# Patient Record
Sex: Male | Born: 1955 | Race: Black or African American | Hispanic: No | Marital: Married | Smoking: Never smoker
Health system: Southern US, Community
[De-identification: ages and names within clinical notes are randomized; demographics above are authoritative.]

## PROBLEM LIST (undated history)

## (undated) ENCOUNTER — Emergency Department (HOSPITAL_COMMUNITY): Discharge status: Against Medical Advice | Payer: BC Managed Care – PPO

## (undated) DIAGNOSIS — R7303 Prediabetes: Secondary | ICD-10-CM

## (undated) DIAGNOSIS — E559 Vitamin D deficiency, unspecified: Secondary | ICD-10-CM

## (undated) DIAGNOSIS — G4730 Sleep apnea, unspecified: Secondary | ICD-10-CM

## (undated) DIAGNOSIS — F419 Anxiety disorder, unspecified: Secondary | ICD-10-CM

## (undated) DIAGNOSIS — R972 Elevated prostate specific antigen [PSA]: Secondary | ICD-10-CM

## (undated) DIAGNOSIS — M1990 Unspecified osteoarthritis, unspecified site: Secondary | ICD-10-CM

## (undated) DIAGNOSIS — M2550 Pain in unspecified joint: Secondary | ICD-10-CM

## (undated) DIAGNOSIS — I10 Essential (primary) hypertension: Secondary | ICD-10-CM

## (undated) DIAGNOSIS — N529 Male erectile dysfunction, unspecified: Secondary | ICD-10-CM

## (undated) DIAGNOSIS — H9190 Unspecified hearing loss, unspecified ear: Secondary | ICD-10-CM

## (undated) DIAGNOSIS — R079 Chest pain, unspecified: Secondary | ICD-10-CM

## (undated) DIAGNOSIS — Z96649 Presence of unspecified artificial hip joint: Secondary | ICD-10-CM

## (undated) DIAGNOSIS — E739 Lactose intolerance, unspecified: Secondary | ICD-10-CM

## (undated) DIAGNOSIS — R200 Anesthesia of skin: Secondary | ICD-10-CM

## (undated) DIAGNOSIS — K219 Gastro-esophageal reflux disease without esophagitis: Secondary | ICD-10-CM

## (undated) DIAGNOSIS — J45909 Unspecified asthma, uncomplicated: Secondary | ICD-10-CM

## (undated) DIAGNOSIS — N400 Enlarged prostate without lower urinary tract symptoms: Secondary | ICD-10-CM

## (undated) DIAGNOSIS — Z96659 Presence of unspecified artificial knee joint: Secondary | ICD-10-CM

## (undated) DIAGNOSIS — C61 Malignant neoplasm of prostate: Secondary | ICD-10-CM

## (undated) HISTORY — PX: FOOT SURGERY: SHX648

## (undated) HISTORY — DX: Prediabetes: R73.03

## (undated) HISTORY — DX: Benign prostatic hyperplasia without lower urinary tract symptoms: N40.0

## (undated) HISTORY — DX: Presence of unspecified artificial hip joint: Z96.649

## (undated) HISTORY — DX: Vitamin D deficiency, unspecified: E55.9

## (undated) HISTORY — PX: TOTAL HIP ARTHROPLASTY: SHX124

## (undated) HISTORY — DX: Male erectile dysfunction, unspecified: N52.9

## (undated) HISTORY — DX: Unspecified asthma, uncomplicated: J45.909

## (undated) HISTORY — DX: Lactose intolerance, unspecified: E73.9

## (undated) HISTORY — DX: Anesthesia of skin: R20.0

## (undated) HISTORY — DX: Unspecified hearing loss, unspecified ear: H91.90

## (undated) HISTORY — DX: Essential (primary) hypertension: I10

## (undated) HISTORY — DX: Anxiety disorder, unspecified: F41.9

## (undated) HISTORY — DX: Malignant neoplasm of prostate: C61

## (undated) HISTORY — DX: Presence of unspecified artificial knee joint: Z96.659

---
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
## 1999-05-07 ENCOUNTER — Emergency Department (HOSPITAL_COMMUNITY): Admission: EM | Admit: 1999-05-07 | Discharge: 1999-05-07 | Payer: Self-pay | Admitting: Emergency Medicine

## 2002-07-27 ENCOUNTER — Ambulatory Visit (HOSPITAL_BASED_OUTPATIENT_CLINIC_OR_DEPARTMENT_OTHER): Admission: RE | Admit: 2002-07-27 | Discharge: 2002-07-27 | Payer: Self-pay | Admitting: Urology

## 2003-01-03 ENCOUNTER — Encounter: Payer: Self-pay | Admitting: Family Medicine

## 2003-01-03 ENCOUNTER — Encounter: Admission: RE | Admit: 2003-01-03 | Discharge: 2003-01-03 | Payer: Self-pay | Admitting: Family Medicine

## 2004-03-27 ENCOUNTER — Ambulatory Visit: Payer: Self-pay | Admitting: Family Medicine

## 2004-04-08 ENCOUNTER — Ambulatory Visit: Payer: Self-pay | Admitting: Family Medicine

## 2004-04-14 ENCOUNTER — Ambulatory Visit: Payer: Self-pay | Admitting: Family Medicine

## 2005-01-25 ENCOUNTER — Ambulatory Visit: Payer: Self-pay | Admitting: Family Medicine

## 2005-05-10 ENCOUNTER — Ambulatory Visit: Payer: Self-pay | Admitting: Family Medicine

## 2005-05-14 ENCOUNTER — Ambulatory Visit: Payer: Self-pay | Admitting: Family Medicine

## 2005-05-17 ENCOUNTER — Encounter: Admission: RE | Admit: 2005-05-17 | Discharge: 2005-08-15 | Payer: Self-pay | Admitting: Family Medicine

## 2005-09-17 ENCOUNTER — Ambulatory Visit: Payer: Self-pay | Admitting: Family Medicine

## 2005-10-08 ENCOUNTER — Ambulatory Visit: Payer: Self-pay | Admitting: Internal Medicine

## 2006-01-25 ENCOUNTER — Ambulatory Visit: Payer: Self-pay | Admitting: Family Medicine

## 2006-07-12 ENCOUNTER — Inpatient Hospital Stay (HOSPITAL_COMMUNITY): Admission: RE | Admit: 2006-07-12 | Discharge: 2006-07-16 | Payer: Self-pay | Admitting: Orthopaedic Surgery

## 2006-08-29 ENCOUNTER — Ambulatory Visit: Payer: Self-pay | Admitting: Gastroenterology

## 2006-09-09 ENCOUNTER — Ambulatory Visit: Payer: Self-pay | Admitting: Gastroenterology

## 2006-12-09 ENCOUNTER — Ambulatory Visit: Payer: Self-pay | Admitting: Family Medicine

## 2006-12-09 DIAGNOSIS — I10 Essential (primary) hypertension: Secondary | ICD-10-CM | POA: Insufficient documentation

## 2007-04-01 ENCOUNTER — Ambulatory Visit: Payer: Self-pay | Admitting: Internal Medicine

## 2007-04-18 ENCOUNTER — Inpatient Hospital Stay (HOSPITAL_COMMUNITY): Admission: RE | Admit: 2007-04-18 | Discharge: 2007-04-22 | Payer: Self-pay | Admitting: Orthopaedic Surgery

## 2007-04-24 ENCOUNTER — Telehealth: Payer: Self-pay | Admitting: Family Medicine

## 2007-05-01 ENCOUNTER — Telehealth: Payer: Self-pay | Admitting: Internal Medicine

## 2007-06-15 ENCOUNTER — Ambulatory Visit: Payer: Self-pay | Admitting: Family Medicine

## 2007-06-15 DIAGNOSIS — J069 Acute upper respiratory infection, unspecified: Secondary | ICD-10-CM | POA: Insufficient documentation

## 2007-06-15 LAB — CONVERTED CEMR LAB
BUN: 8 mg/dL (ref 6–23)
CO2: 35 meq/L — ABNORMAL HIGH (ref 19–32)
Calcium: 9.3 mg/dL (ref 8.4–10.5)
Chloride: 91 meq/L — ABNORMAL LOW (ref 96–112)
Creatinine, Ser: 1 mg/dL (ref 0.4–1.5)
GFR calc Af Amer: 102 mL/min
GFR calc non Af Amer: 84 mL/min
Glucose, Bld: 93 mg/dL (ref 70–99)
Potassium: 3.2 meq/L — ABNORMAL LOW (ref 3.5–5.1)
Sodium: 136 meq/L (ref 135–145)

## 2007-07-12 HISTORY — PX: NM MYOVIEW LTD: HXRAD82

## 2007-07-24 ENCOUNTER — Ambulatory Visit: Payer: Self-pay | Admitting: Family Medicine

## 2007-07-24 DIAGNOSIS — F411 Generalized anxiety disorder: Secondary | ICD-10-CM | POA: Insufficient documentation

## 2007-07-24 DIAGNOSIS — H8110 Benign paroxysmal vertigo, unspecified ear: Secondary | ICD-10-CM | POA: Insufficient documentation

## 2007-07-25 ENCOUNTER — Ambulatory Visit: Payer: Self-pay | Admitting: Family Medicine

## 2007-07-31 ENCOUNTER — Ambulatory Visit: Payer: Self-pay

## 2007-08-04 ENCOUNTER — Ambulatory Visit: Payer: Self-pay | Admitting: Family Medicine

## 2007-09-01 ENCOUNTER — Ambulatory Visit: Payer: Self-pay | Admitting: Family Medicine

## 2008-01-19 ENCOUNTER — Ambulatory Visit: Payer: Self-pay | Admitting: Family Medicine

## 2008-01-19 DIAGNOSIS — F528 Other sexual dysfunction not due to a substance or known physiological condition: Secondary | ICD-10-CM | POA: Insufficient documentation

## 2008-11-11 ENCOUNTER — Ambulatory Visit: Payer: Self-pay | Admitting: Family Medicine

## 2008-11-11 LAB — CONVERTED CEMR LAB
Alkaline Phosphatase: 69 units/L (ref 39–117)
BUN: 12 mg/dL (ref 6–23)
Basophils Absolute: 0 10*3/uL (ref 0.0–0.1)
Basophils Relative: 0.5 % (ref 0.0–3.0)
Bilirubin, Direct: 0.1 mg/dL (ref 0.0–0.3)
Chloride: 99 meq/L (ref 96–112)
Cholesterol: 131 mg/dL (ref 0–200)
Eosinophils Absolute: 0.1 10*3/uL (ref 0.0–0.7)
Glucose, Bld: 101 mg/dL — ABNORMAL HIGH (ref 70–99)
HDL: 34.2 mg/dL — ABNORMAL LOW (ref >39.00)
LDL Cholesterol: 73 mg/dL (ref 0–99)
Leukocytes, UA: NEGATIVE
Lymphocytes Relative: 23.6 % (ref 12.0–46.0)
MCHC: 33.3 g/dL (ref 30.0–36.0)
Monocytes Absolute: 0.6 10*3/uL (ref 0.1–1.0)
Neutrophils Relative %: 63.2 % (ref 43.0–77.0)
Nitrite: NEGATIVE
PSA: 2.73 ng/mL (ref 0.10–4.00)
Platelets: 339 10*3/uL (ref 150.0–400.0)
Potassium: 3.2 meq/L — ABNORMAL LOW (ref 3.5–5.1)
RBC: 4.72 M/uL (ref 4.22–5.81)
Sodium: 141 meq/L (ref 135–145)
Specific Gravity, Urine: 1.02 (ref 1.000–1.030)
Total Bilirubin: 0.5 mg/dL (ref 0.3–1.2)
Total Protein, Urine: NEGATIVE mg/dL
VLDL: 24 mg/dL (ref 0.0–40.0)
pH: 5.5 (ref 5.0–8.0)

## 2008-11-15 ENCOUNTER — Ambulatory Visit: Payer: Self-pay | Admitting: Family Medicine

## 2008-12-04 ENCOUNTER — Ambulatory Visit: Payer: Self-pay | Admitting: Family Medicine

## 2009-04-17 ENCOUNTER — Ambulatory Visit: Payer: Self-pay | Admitting: Family Medicine

## 2009-04-17 DIAGNOSIS — J309 Allergic rhinitis, unspecified: Secondary | ICD-10-CM | POA: Insufficient documentation

## 2009-05-21 ENCOUNTER — Ambulatory Visit: Payer: Self-pay | Admitting: Family Medicine

## 2009-07-03 ENCOUNTER — Ambulatory Visit: Payer: Self-pay | Admitting: Internal Medicine

## 2009-12-19 ENCOUNTER — Ambulatory Visit: Payer: Self-pay | Admitting: Family Medicine

## 2010-01-06 ENCOUNTER — Ambulatory Visit: Payer: Self-pay | Admitting: Family Medicine

## 2010-02-18 ENCOUNTER — Ambulatory Visit: Payer: Self-pay | Admitting: Family Medicine

## 2010-03-02 ENCOUNTER — Ambulatory Visit: Payer: Self-pay | Admitting: Family Medicine

## 2010-03-13 ENCOUNTER — Telehealth: Payer: Self-pay | Admitting: Family Medicine

## 2010-03-19 ENCOUNTER — Telehealth: Payer: Self-pay | Admitting: Family Medicine

## 2010-04-17 ENCOUNTER — Ambulatory Visit
Admission: RE | Admit: 2010-04-17 | Discharge: 2010-04-17 | Payer: Self-pay | Source: Home / Self Care | Attending: Family Medicine | Admitting: Family Medicine

## 2010-04-17 ENCOUNTER — Encounter: Payer: Self-pay | Admitting: Family Medicine

## 2010-04-20 ENCOUNTER — Telehealth: Payer: Self-pay | Admitting: Gastroenterology

## 2010-04-20 LAB — CONVERTED CEMR LAB
ALT: 15 units/L (ref 0–53)
BUN: 13 mg/dL (ref 6–23)
Basophils Absolute: 0 10*3/uL (ref 0.0–0.1)
Basophils Relative: 0 % (ref 0–1)
CO2: 31 meq/L (ref 19–32)
Calcium: 9.2 mg/dL (ref 8.4–10.5)
Chloride: 97 meq/L (ref 96–112)
Creatinine, Ser: 0.89 mg/dL (ref 0.40–1.50)
Eosinophils Relative: 2 % (ref 0–5)
HCT: 39.8 % (ref 39.0–52.0)
Hemoglobin: 13.1 g/dL (ref 13.0–17.0)
Lymphocytes Relative: 21 % (ref 12–46)
MCHC: 32.9 g/dL (ref 30.0–36.0)
Monocytes Absolute: 1.1 10*3/uL — ABNORMAL HIGH (ref 0.1–1.0)
Monocytes Relative: 12 % (ref 3–12)
Neutro Abs: 5.9 10*3/uL (ref 1.7–7.7)
RBC: 5.15 M/uL (ref 4.22–5.81)
RDW: 17.1 % — ABNORMAL HIGH (ref 11.5–15.5)
TSH: 1.714 microintl units/mL (ref 0.350–4.500)
Total Protein: 8 g/dL (ref 6.0–8.3)

## 2010-04-21 ENCOUNTER — Encounter: Payer: Self-pay | Admitting: Gastroenterology

## 2010-04-21 ENCOUNTER — Ambulatory Visit
Admission: RE | Admit: 2010-04-21 | Discharge: 2010-04-21 | Payer: Self-pay | Source: Home / Self Care | Attending: Gastroenterology | Admitting: Gastroenterology

## 2010-05-03 ENCOUNTER — Encounter: Payer: Self-pay | Admitting: Orthopaedic Surgery

## 2010-05-10 LAB — CONVERTED CEMR LAB
Albumin ELP: 44.3 % — ABNORMAL LOW (ref 55.8–66.1)
Alpha-1-Globulin: 4.2 % (ref 2.9–4.9)
Gamma Globulin: 27 % — ABNORMAL HIGH (ref 11.1–18.8)
Total Protein: 8.4 g/dL — ABNORMAL HIGH (ref 6.0–8.3)

## 2010-05-14 NOTE — Assessment & Plan Note (Signed)
Summary: BLOOD IN STOOL//SLM   Vital Signs:  Patient profile:   55 year old male Weight:      269 pounds Temp:     98.0 degrees F oral BP sitting:   120 / 84  (left arm) Cuff size:   regular  Vitals Entered By: Kern Reap CMA Duncan Dull) (April 17, 2010 4:17 PM) CC: right side abdominal pain, BRB with bowel movement   CC:  right side abdominal pain and BRB with bowel movement.  History of Present Illness: Jonathan [name] is a 55 year old male, who comes in today for evaluation of an 8 month history of intermittent right-sided lower quadrant abdominal pain, change in bowel habits with blood in his bowel movements.  He states about a month ago he developed constipation and did not have a bowel movement for a week.  He then took the colon prep for the colonoscopy however, this still did not get them completely cleaned out.  Since that time.  He said, trouble with hard stools, and now some bright red rectal bleeding.  He had a colonoscopy in 2002 and a follow-up.  Another 07, which were normal.  Family history, pertinent his mother died of colon cancer  Allergies: 1)  ! Codeine  Past History:  Past medical, surgical, family and social histories (including risk factors) reviewed for relevance to current acute and chronic problems.  Past Medical History: Reviewed history from 09/01/2007 and no changes required. Hypertension ED Anxiety total hip replacement R&L  Past Surgical History: Reviewed history from 12/09/2006 and no changes required. Dislocated R Hip  Family History: Reviewed history from 12/09/2006 and no changes required. Family History Hypertension Family History Other cancer-colon Family History of Asthma Family History of Prostate CA 1st degree relative <50  Social History: Reviewed history from 12/09/2006 and no changes required. Never Smoked Alcohol use-no Drug use-no Married  Review of Systems      See HPI  Physical Exam  General:   Well-developed,well-nourished,in no acute distress; alert,appropriate and cooperative throughout examination Abdomen:  Bowel sounds positive,abdomen soft and non-tender without masses, organomegaly or hernias noted. Rectal:  deferred to GI   Problems:  Medical Problems Added: 1)  Dx of Rectal Bleeding  (ICD-569.3)  Impression & Recommendations:  Problem # 1:  RECTAL BLEEDING (ICD-569.3) Assessment New  Orders: Venipuncture (16109) TLB-BMP (Basic Metabolic Panel-BMET) (80048-METABOL) TLB-CBC Platelet - w/Differential (85025-CBCD) TLB-Hepatic/Liver Function Pnl (80076-HEPATIC) TLB-TSH (Thyroid Stimulating Hormone) (84443-TSH) Specimen Handling (60454)  Complete Medication List: 1)  Triamcinolone Acetonide 0.5 % Oint (Triamcinolone acetonide) .... Apply at bedtime 2)  Proventil Hfa 108 (90 Base) Mcg/act Aers (Albuterol sulfate) .... 2 puffs three times a day as needed 3)  Atenolol-chlorthalidone 50-25 Mg Tabs (Atenolol-chlorthalidone) .... Take one tab daily  Other Orders: Gastroenterology Referral (GI)  Patient Instructions: 1)  drink 30 ounces of water daily. 2)  Take Metamucil two tablespoons at bedtime and take one MiraLax tablet every morning. 3)  I will put in a note to GI to have them see u  next week for further evaluation   Orders Added: 1)  Gastroenterology Referral [GI] 2)  Venipuncture [09811] 3)  TLB-BMP (Basic Metabolic Panel-BMET) [80048-METABOL] 4)  TLB-CBC Platelet - w/Differential [85025-CBCD] 5)  TLB-Hepatic/Liver Function Pnl [80076-HEPATIC] 6)  TLB-TSH (Thyroid Stimulating Hormone) [84443-TSH] 7)  Est. Patient Level III [91478] 8)  Specimen Handling [99000]

## 2010-05-14 NOTE — Assessment & Plan Note (Signed)
Summary: right leg issues//ccm   Vital Signs:  Patient profile:   55 year old male Weight:      243 pounds Temp:     97.3 degrees F oral BP sitting:   130 / 78  (right arm) Cuff size:   regular  Vitals Entered By: Duard Brady LPN (July 03, 2009 12:47 PM) CC: c/o (R) lower leg pain x2days - no fall or injury noted. Is Patient Diabetic? No   CC:  c/o (R) lower leg pain x2days - no fall or injury noted.[name]  History of Present Illness: 55 year old patient who has a history of treated hypertension.  He presents with a 3-day history of pain involving his right posterior calf.  For the past month, he has had patchy areas of eczema involving his distal lower extremities.  He is now on Cozaar for blood pressure control.  He has had some ED issues and hypokalemia and  combination beta-blocker diuretic therapy has been discontinued.  Preventive Screening-Counseling & Management  Alcohol-Tobacco     Smoking Status: never  Allergies: 1)  ! Codeine  Past History:  Past Medical History: Reviewed history from 09/01/2007 and no changes required. Hypertension ED Anxiety total hip replacement R&L  Review of Systems       The patient complains of suspicious skin lesions.  The patient denies anorexia, fever, weight loss, weight gain, vision loss, decreased hearing, hoarseness, chest pain, syncope, dyspnea on exertion, peripheral edema, prolonged cough, headaches, hemoptysis, abdominal pain, melena, hematochezia, severe indigestion/heartburn, hematuria, incontinence, genital sores, muscle weakness, transient blindness, difficulty walking, depression, unusual weight change, abnormal bleeding, enlarged lymph nodes, angioedema, breast masses, and testicular masses.    Physical Exam  General:  overweight-appearing.  140/80 Abdomen:  no femoral bruits Pulses:  dorsalis pedis pulses faint posterior tibia pulses full Extremities:  a small, slightly tender varix was present involving his right  postero-lateral calf Skin:  dry scaly hyperpigmented patches of dermatitis noted over his distal lower legs   Impression & Recommendations:  Problem # 1:  HYPERTENSION (ICD-401.9)  His updated medication list for this problem includes:    Cozaar 50 Mg Tabs (Losartan potassium) .[name]... Take 1 tablet by mouth every morning  Problem # 2:  ECZEMA (ICD-692.9)  His updated medication list for this problem includes:    Prednisone 20 Mg Tabs (Prednisone) ..... Uad    Triamcinolone Acetonide 0.5 % Crea (Triamcinolone acetonide) .[name]... Apply to the rash twice daily  Problem # 3:  VARICES OF OTHER SITES (ICD-456.8) was treated with elevation anti-inflammatory medications and observe.  If remains symptomatically consider support stockings and possibly referral to a vascular surgeon  Complete Medication List: 1)  Cialis 20 Mg Tabs (Tadalafil) .... Uad 2)  Cozaar 50 Mg Tabs (Losartan potassium) .... Take 1 tablet by mouth every morning 3)  Prostate 5lx  .... Two times a day prn 4)  Prednisone 20 Mg Tabs (Prednisone) .... Uad 5)  Triamcinolone Acetonide 0.5 % Crea (Triamcinolone acetonide) .... Apply to the rash twice daily  Patient Instructions: 1)  Take 400-600mg  of Ibuprofen (Advil, Motrin) with food every 4-6 hours as needed for relief of pain or comfort of fever. 2)  keep  right leg elevated as much as possible 3)  Please schedule a follow-up appointment as needed. Prescriptions: TRIAMCINOLONE ACETONIDE 0.5 % CREA (TRIAMCINOLONE ACETONIDE) apply to the rash twice daily  #60 gms x 2   Entered and Authorized by:   Gordy Savers  MD   Signed by:  Gordy Savers  MD on 07/03/2009   Method used:   [facility] [address] [phone]* (retail)       [address]       [place], [place]  [address]       Ph: [phone] or [phone]       Fax: [phone]   RxID:   [id number] PREDNISONE 20 MG TABS (PREDNISONE) UAD  #20 x 1    Entered and Authorized by:   Gordy Savers  MD   Signed by:   Gordy Savers  MD on 07/03/2009   Method used:   [facility]        [facility]  [address] [phone]* (retail)       [address]       [place], [place]  [address]       Ph: [phone] or [phone]       Fax: [phone]   RxID:   [id number] COZAAR 50 MG TABS (LOSARTAN POTASSIUM) Take 1 tablet by mouth every morning  #100 x 3   Entered and Authorized by:   Gordy Savers  MD   Signed by:   Gordy Savers  MD on 07/03/2009   Method used:   [facility]        [facility]  [address] [phone]* (retail)       [address]       [place], [place]  [address]       Ph: [phone] or [phone]       Fax: [phone]   RxID:   [id number] CIALIS 20 MG TABS (TADALAFIL) UAD  #6 x 11   Entered and Authorized by:   Gordy Savers  MD   Signed by:   Gordy Savers  MD on 07/03/2009   Method used:   [facility]        [facility]  [address] [phone]* (retail)       [address]       [place], [place]  [address]       Ph: [phone] or [phone]       Fax: [phone]   RxID:   [id number]

## 2010-05-14 NOTE — Assessment & Plan Note (Signed)
Summary: 3 week follow up/cjr/pt rescd//ccm   Vital Signs:  Patient profile:   55 year old male Weight:      259 pounds Temp:     97.9 degrees F oral BP sitting:   120 / 80  (left arm) Cuff size:   regular  Vitals Entered By: Kern Reap CMA Duncan Dull) (February 18, 2010 9:02 AM) CC: follow-up visit   CC:  follow-up visit.  History of Present Illness: Jonathan [name] is a 55 year old male, who comes in today for follow-up of hypertension, and a skin rash.  We had him on max dose of Cozaar 100 mg daily however, his blood pressure did not drop back to normal.  We then restarted the Tenoretic 50 -- 25 daily.  BP normal 120/80, but it has a very, very, very negative effect on his nature.  We discussed options.  Will stop the Tenoretic and switch to Norvasc  Allergies: 1)  ! Codeine  Past History:  Past medical, surgical, family and social histories (including risk factors) reviewed for relevance to current acute and chronic problems.  Past Medical History: Reviewed history from 09/01/2007 and no changes required. Hypertension ED Anxiety total hip replacement R&L  Past Surgical History: Reviewed history from 12/09/2006 and no changes required. Dislocated R Hip  Family History: Reviewed history from 12/09/2006 and no changes required. Family History Hypertension Family History Other cancer-colon Family History of Asthma Family History of Prostate CA 1st degree relative <50  Social History: Reviewed history from 12/09/2006 and no changes required. Never Smoked Alcohol use-no Drug use-no Married  Review of Systems      See HPI  Physical Exam  General:  Well-developed,well-nourished,in no acute distress; alert,appropriate and cooperative throughout examination   Impression & Recommendations:  Problem # 1:  HYPERTENSION (ICD-401.9) Assessment Improved  The following medications were removed from the medication list:    Cozaar 100 Mg Tabs (Losartan potassium) .[name]... Take  1 tablet by mouth every morning    Tenoretic 50 50-25 Mg Tabs (Atenolol-chlorthalidone) .[name]... Take 1 tablet by mouth every morning His updated medication list for this problem includes:    Norvasc 5 Mg Tabs (Amlodipine besylate) .[name]... Take 1 tablet by mouth every morning  Complete Medication List: 1)  Norvasc 5 Mg Tabs (Amlodipine besylate) .... Take 1 tablet by mouth every morning 2)  Triamcinolone Acetonide 0.5 % Oint (Triamcinolone acetonide) .... Apply at bedtime  Patient Instructions: 1)  stop the Tenoretic. 2)  Begin Norvasc 5 mg daily check blood pressure daily in the morning.  Return in two weeks for follow-up with the data  the device Prescriptions: TRIAMCINOLONE ACETONIDE 0.5 % OINT (TRIAMCINOLONE ACETONIDE) apply at bedtime  #60 gr x 3   Entered and Authorized by:   Roderick Pee MD   Signed by:   Roderick Pee MD on 02/18/2010   Method used:   [facility]        [facility] [address] [phone]* (retail)       [address]       [place], [place]  [address]       Ph: [phone] or [phone]       Fax: [phone]   RxID:   [id number] NORVASC 5 MG TABS (AMLODIPINE BESYLATE) Take 1 tablet by mouth every morning  #30 x 1   Entered and Authorized by:   Roderick Pee MD   Signed by:   Roderick Pee MD  on 02/18/2010   Method used:   [facility] to        [facility] [phone]* (retail)       [address]       [place], [place]  [address]       Ph: [phone] or [phone]       Fax: [phone]   RxID:   [id number]    Orders Added: 1)  Est. Patient Level III [25956]

## 2010-05-14 NOTE — Procedures (Signed)
Summary: LEC COLON   Colonoscopy  Procedure date:  09/09/2006  Findings:      Location:  [facility].   Patient Name: Jonathan [name], Jonathan [name] MRN:  Procedure Procedures: Colonoscopy CPT: 08657.  Personnel: Endoscopist: Barbette Hair. Arlyce Dice, MD.  Patient Consent: Procedure, Alternatives, Risks and Benefits discussed, consent obtained, from patient.  Indications  Increased Risk Screening: For family history of colorectal neoplasia, in  parent  History  Current Medications: Patient is not currently taking Coumadin.  Pre-Exam Physical: Performed Sep 09, 2006. Cardio-pulmonary exam, HEENT exam , Abdominal exam, Mental status exam WNL.  Comments: Patient history reviewed/updated, physical performed prior to initiation of sedation?yes Exam Exam: Extent of exam reached: Cecum, extent intended: Cecum.  The cecum was identified by appendiceal orifice and IC valve. Time to Cecum: 00:06: 57. Time for Withdrawl: 00:05:45. Colon retroflexion performed. ASA Classification: I. Tolerance: good.  Monitoring: Pulse and BP monitoring, Oximetry used. Supplemental O2 given. at 2 Liters.  Colon Prep Used Miralax for colon prep. Prep results: good.  Sedation Meds: Patient assessed and found to be appropriate for moderate (conscious) sedation. Sedation was managed by the Endoscopist. Fentanyl 75 mcg. given IV. Versed 8 mg. given IV.  Findings TUMOR: Benign tumor, suspected,  in Ascending Colon. 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  - NORMAL EXAM: Transverse Colon to Rectum.  NORMAL EXAM: Cecum.  NORMAL EXAM: Rectum.   Assessment Abnormal examination, see findings above.  Comments: Incidental Lipoma Events  Unplanned Interventions: No intervention was required.  Unplanned Events: There were no complications. Plans  Post Exam Instructions: Post sedation instructions given.  Patient Education: Patient given standard instructions for: a normal  exam.  Disposition: After procedure patient sent to recovery. After recovery patient sent home.  Scheduling/Referral: Colonoscopy, to Barbette Hair. Arlyce Dice, MD, around Sep 09, 2011.    cc: Tinnie Gens A. Tawanna Cooler, MD  This report was created from the original endoscopy report, which was reviewed and signed by the above listed endoscopist.

## 2010-05-14 NOTE — Letter (Signed)
Summary: [facility] Instructions  [facility]  [address] [place], [place] [address]   Phone: [phone]  Fax: [phone]       Jonathan [name]    02-20-1956    MRN: [id number]        Procedure Day /Date:06-08-2010     Arrival Time: 7:30 Am      Procedure Time: 8:00 AM     Location of Procedure:                    X     [facility] (4th Floor) PREPARATION FOR COLONOSCOPY WITH MOVIPREP   Starting 5 days prior to your procedure 06-03-2010 do not eat nuts, seeds, popcorn, corn, beans, peas,  salads, or any raw vegetables.  Do not take any fiber supplements (e.g. Metamucil, Citrucel, and Benefiber).  THE DAY BEFORE YOUR PROCEDURE         DATE: 06-07-2010 DAY: SundaY  1.  Drink clear liquids the entire day-NO SOLID FOOD  2.  Do not drink anything colored red or purple.  Avoid juices with pulp.  No orange juice.  3.  Drink at least 64 oz. (8 glasses) of fluid/clear liquids during the day to prevent dehydration and help the prep work efficiently.  CLEAR LIQUIDS INCLUDE: Water Jello Ice Popsicles Tea (sugar ok, no milk/cream) Powdered fruit flavored drinks Coffee (sugar ok, no milk/cream) Gatorade Juice: apple, white grape, white cranberry  Lemonade Clear bullion, consomm, broth Carbonated beverages (any kind) Strained chicken noodle soup Hard Candy                             4.  In the morning, mix first dose of MoviPrep solution:    Empty 1 Pouch A and 1 Pouch B into the disposable container    Add lukewarm drinking water to the top line of the container. Mix to dissolve    Refrigerate (mixed solution should be used within 24 hrs)  5.  Begin drinking the prep at 5:00 p.m. The MoviPrep container is divided by 4 marks.   Every 15 minutes drink the solution down to the next mark (approximately 8 oz) until the full liter is complete.   6.  Follow completed prep with 16 oz of clear liquid of your choice (Nothing red or purple).  Continue to  drink clear liquids until bedtime.  7.  Before going to bed, mix second dose of MoviPrep solution:    Empty 1 Pouch A and 1 Pouch B into the disposable container    Add lukewarm drinking water to the top line of the container. Mix to dissolve    Refrigerate  THE DAY OF YOUR PROCEDURE      DATE: 06-08-2010 DAY: Monday  Beginning at 3:00 AM  (5 hours before procedure):         1. Every 15 minutes, drink the solution down to the next mark (approx 8 oz) until the full liter is complete.  2. Follow completed prep with 16 oz. of clear liquid of your choice.    3. You may drink clear liquids until 6:00 AM  (2 HOURS BEFORE PROCEDURE).   MEDICATION INSTRUCTIONS  Unless otherwise instructed, you should take regular prescription medications with a small sip of water   as early as possible the morning of your procedure.        OTHER INSTRUCTIONS  You will need a responsible adult at least 54  years of age to accompany you and drive you home.   This person must remain in the waiting room during your procedure.  Wear loose fitting clothing that is easily removed.  Leave jewelry and other valuables at home.  However, you may wish to bring a book to read or  an iPod/MP3 player to listen to music as you wait for your procedure to start.  Remove all body piercing jewelry and leave at home.  Total time from sign-in until discharge is approximately 2-3 hours.  You should go home directly after your procedure and rest.  You can resume normal activities the  day after your procedure.  The day of your procedure you should not:   Drive   Make legal decisions   Operate machinery   Drink alcohol   Return to work  You will receive specific instructions about eating, activities and medications before you leave.    The above instructions have been reviewed and explained to me by   _______________________    I fully understand and can verbalize these instructions  _____________________________ Date _________

## 2010-05-14 NOTE — Progress Notes (Signed)
Summary: Pt req cough med. Tessalon doesnt work.  Phone Note Call from Patient Call back at [phone]   Caller: spouse - Synetta Fail Summary of Call: Pt still has dry hacky cough and tessalon pearls do not work. Pt can not take any med with codene in it , due to bp. Pt is req a different cough med. [facility] [address] [phone] Initial call taken by: Lucy Antigua,  March 19, 2010 9:56 AM  Follow-up for Phone Call        [facility] please call.......... okay to take Hydromet, with hypertension one half to 1 teaspoon up to 3 times a day as needed Follow-up by: Roderick Pee MD,  March 19, 2010 1:24 PM  Additional Follow-up for Phone Call Additional follow up Details #1::        patient has tried hydromet and it makes him feel "sick" Additional Follow-up by: Kern Reap CMA Duncan Dull),  March 19, 2010 1:57 PM    Additional Follow-up for Phone Call Additional follow up Details #2::    there are no other options if the Tessalon Perles don't, work that's all we have Follow-up by: Roderick Pee MD,  March 19, 2010 3:07 PM  Additional Follow-up for Phone Call Additional follow up Details #3:: Details for Additional Follow-up Action Taken: left message on machine  Additional Follow-up by: Kern Reap CMA Duncan Dull),  March 19, 2010 3:39 PM

## 2010-05-14 NOTE — Assessment & Plan Note (Signed)
Summary: HIBH BP/PS   Vital Signs:  Patient profile:   55 year old male Weight:      260 pounds Temp:     97.8 degrees F oral BP sitting:   140 / 100  (left arm) Cuff size:   large  Vitals Entered By: Kathrynn Speed CMA (December 19, 2009 1:17 PM) CC: HBH BP, src Is Patient Diabetic? No   CC:  HBH BP and src.  History of Present Illness: Jonathan [name] is a 55 year old male, who comes in today for evaluation of hypertension.  He spent on Cozaar 50 mg daily for hypertension with good control.  However, in the last couple weeks.  His blood pressure has gone up.  Today it's 160/94.  Right arm sitting position.  He states he takes his medication daily  Preventive Screening-Counseling & Management  Alcohol-Tobacco     Smoking Status: never  Current Medications (verified): 1)  Cozaar 50 Mg Tabs (Losartan Potassium) .... Take 1 Tablet By Mouth Every Morning  Allergies (verified): 1)  ! Codeine  Past History:  Past medical, surgical, family and social histories (including risk factors) reviewed for relevance to current acute and chronic problems.  Past Medical History: Reviewed history from 09/01/2007 and no changes required. Hypertension ED Anxiety total hip replacement R&L  Past Surgical History: Reviewed history from 12/09/2006 and no changes required. Dislocated R Hip  Family History: Reviewed history from 12/09/2006 and no changes required. Family History Hypertension Family History Other cancer-colon Family History of Asthma Family History of Prostate CA 1st degree relative <50  Social History: Reviewed history from 12/09/2006 and no changes required. Never Smoked Alcohol use-no Drug use-no Married  Review of Systems      See HPI  Physical Exam  General:  Well-developed,well-nourished,in no acute distress; alert,appropriate and cooperative throughout examination Heart:  160/94 right arm sitting position   Impression & Recommendations:  Problem # 1:   HYPERTENSION (ICD-401.9) Assessment Deteriorated  His updated medication list for this problem includes:    Cozaar 50 Mg Tabs (Losartan potassium) .[name]... Take 1 tablet by mouth every morning    Cozaar 100 Mg Tabs (Losartan potassium) .[name]... Take 1 tablet by mouth every morning  Orders: Prescription Created Electronically [phone])  Complete Medication List: 1)  Cozaar 50 Mg Tabs (Losartan potassium) .... Take 1 tablet by mouth every morning 2)  Cozaar 100 Mg Tabs (Losartan potassium) .... Take 1 tablet by mouth every morning  Patient Instructions: 1)  increase the Cozaar to 100 mg daily in the morning. 2)  Check a blood pressure daily in the morning. 3)  Return in 3 weeks with the data and the device Prescriptions: COZAAR 100 MG TABS (LOSARTAN POTASSIUM) Take 1 tablet by mouth every morning  #100 x 3   Entered and Authorized by:   Roderick Pee MD   Signed by:   Roderick Pee MD on 12/19/2009   Method used:   [facility]        [facility] [address] [phone]* (retail)       [address]       [place], [place]  [id number]       Ph: [phone] or [phone]       Fax: [phone]   RxID:   [id number]

## 2010-05-14 NOTE — Assessment & Plan Note (Signed)
Summary: cough/chest congestion/12.45p/njr   Vital Signs:  Patient profile:   55 year old male Weight:      264 pounds Temp:     98.3 degrees F oral BP sitting:   140 / 90  (left arm) Cuff size:   regular  Vitals Entered By: Kern Reap CMA Duncan Dull) (March 02, 2010 12:49 PM) CC: chest cough and congestion   CC:  chest cough and congestion.  History of Present Illness: norm is a 55 year old, married man, nonsmoker, who comes in with a 4-day history of a cold.  He states on Friday he developed some fever, chills, aching all over, sore throat, and cough after one day the fever and chills went away.  The cough has persisted.  No wheezing, etc., etc.  Allergies: 1)  ! Codeine  Past History:  Past medical, surgical, family and social histories (including risk factors) reviewed for relevance to current acute and chronic problems.  Past Medical History: Reviewed history from 09/01/2007 and no changes required. Hypertension ED Anxiety total hip replacement R&L  Past Surgical History: Reviewed history from 12/09/2006 and no changes required. Dislocated R Hip  Family History: Reviewed history from 12/09/2006 and no changes required. Family History Hypertension Family History Other cancer-colon Family History of Asthma Family History of Prostate CA 1st degree relative <50  Social History: Reviewed history from 12/09/2006 and no changes required. Never Smoked Alcohol use-no Drug use-no Married  Review of Systems      See HPI  Physical Exam  General:  Well-developed,well-nourished,in no acute distress; alert,appropriate and cooperative throughout examination Head:  Normocephalic and atraumatic without obvious abnormalities. No apparent alopecia or balding. Eyes:  No corneal or conjunctival inflammation noted. EOMI. Perrla. Funduscopic exam benign, without hemorrhages, exudates or papilledema. Vision grossly normal. Ears:  External ear exam shows no significant  lesions or deformities.  Otoscopic examination reveals clear canals, tympanic membranes are intact bilaterally without bulging, retraction, inflammation or discharge. Hearing is grossly normal bilaterally. Nose:  External nasal examination shows no deformity or inflammation. Nasal mucosa are pink and moist without lesions or exudates. Mouth:  Oral mucosa and oropharynx without lesions or exudates.  Teeth in good repair. Neck:  No deformities, masses, or tenderness noted. Chest Wall:  No deformities, masses, tenderness or gynecomastia noted. Breasts:  No masses or gynecomastia noted Lungs:  Normal respiratory effort, chest expands symmetrically. Lungs are clear to auscultation, no crackles or wheezes.   Impression & Recommendations:  Problem # 1:  VIRAL URI (ICD-465.9) Assessment Deteriorated  His updated medication list for this problem includes:    Hydromet 5-1.5 Mg/9ml Syrp (Hydrocodone-homatropine) .[name]... 1/2 to 1 tsp three times a day as needed  Complete Medication List: 1)  Norvasc 5 Mg Tabs (Amlodipine besylate) .... Take 1 tablet by mouth every morning 2)  Triamcinolone Acetonide 0.5 % Oint (Triamcinolone acetonide) .... Apply at bedtime 3)  Proventil Hfa 108 (90 Base) Mcg/act Aers (Albuterol sulfate) .... 2 puffs three times a day as needed 4)  Hydromet 5-1.5 Mg/64ml Syrp (Hydrocodone-homatropine) .... 1/2 to 1 tsp three times a day as needed  Patient Instructions: 1)  drink 30 ounces of water daily. 2)  Hydromet one half to 1 teaspoon up to 3 times a day as needed for cough.  Return p.r.n.[name] 3)  Albuterol one to two puffs 4 times a day if he begins to wheeze.  If however, the albuterol does not help, then we need to see you in the office  Prescriptions: HYDROMET 5-1.5 MG/5ML SYRP (  HYDROCODONE-HOMATROPINE) 1/2 to 1 tsp three times a day as needed  #8oz x 1   Entered and Authorized by:   Roderick Pee MD   Signed by:   Roderick Pee MD on 03/02/2010   Method used:   Print then  Give to Patient   RxID:   [id number] PROVENTIL HFA 108 (90 BASE) MCG/ACT AERS (ALBUTEROL SULFATE) 2 puffs three times a day as needed  #1 x 1   Entered and Authorized by:   Roderick Pee MD   Signed by:   Roderick Pee MD on 03/02/2010   Method used:   Print then Give to Patient   RxID:   [id number]    Orders Added: 1)  Est. Patient Level III [84696]

## 2010-05-14 NOTE — Assessment & Plan Note (Signed)
Summary: PAIN IN LEFT EAR/PARTIAL LOSS OF HEARING/CJR   Vital Signs:  Patient profile:   55 year old male Weight:      240 pounds Temp:     99.3 degrees F oral BP sitting:   120 / 86  (left arm)  Vitals Entered By: Kern Reap CMA Duncan Dull) (May 21, 2009 9:57 AM)  Reason for Visit left ear pain  History of Present Illness: norm is a 55 year old, married male, nonsmoker, who comes in today for evaluation of decreased hearing and discomfort in his left ear.  He has a history of allergic rhinitis, for which he takes Claritin, 10 mg daily.  For past couple, weeks he's noticed some fullness in his left ear some intermittent pain in  Review of systems negative  Allergies: 1)  ! Codeine  Past History:  Past medical, surgical, family and social histories (including risk factors) reviewed for relevance to current acute and chronic problems.  Past Medical History: Reviewed history from 09/01/2007 and no changes required. Hypertension ED Anxiety total hip replacement R&L  Past Surgical History: Reviewed history from 12/09/2006 and no changes required. Dislocated R Hip  Family History: Reviewed history from 12/09/2006 and no changes required. Family History Hypertension Family History Other cancer-colon Family History of Asthma Family History of Prostate CA 1st degree relative <50  Social History: Reviewed history from 12/09/2006 and no changes required. Never Smoked Alcohol use-no Drug use-no Married  Review of Systems      See HPI  Physical Exam  General:  Well-developed,well-nourished,in no acute distress; alert,appropriate and cooperative throughout examination Head:  Normocephalic and atraumatic without obvious abnormalities. No apparent alopecia or balding. Ears:  right ear normal left ear shows a cerumen impaction, which was removed with the irrigation eardrum normal Nose:  septum in the midline 2+ nasal edema Mouth:  Oral mucosa and oropharynx without  lesions or exudates.  Teeth in good repair.   Impression & Recommendations:  Problem # 1:  ALLERGIC RHINITIS (ICD-477.9) Assessment Deteriorated  Complete Medication List: 1)  Cialis 20 Mg Tabs (Tadalafil) .... Uad 2)  Cozaar 50 Mg Tabs (Losartan potassium) .... Take 1 tablet by mouth every morning 3)  Prostate 5lx  .... Two times a day 4)  Prednisone 20 Mg Tabs (Prednisone) .... Uad  Patient Instructions: 1)  you might consider switching to a Zyrtec 10 mg plain q. h.s. 2)  Setup for your annual physical exam, the first week in August 3)  cbc, lipid panel, u/a ,tsh level, liver fcn panel,bmet, and psa prior to next visit (v70.0)

## 2010-05-14 NOTE — Progress Notes (Signed)
Summary: REQ FOR ALTERNATIVE MEDICATION / RX  Phone Note Call from Patient   Caller: Spouse    [phone]   or   [phone] Summary of Call: Pts wife called to adv that pt has been light-headed and is still exp cough, chills, head congestion..... She adv that the med that was precribed at his last OV (Hydromet 5-1.5 Mg/10ml Syrp (Hydrocodone-homatropine)..... Pts wife adv that codiene / hydrocodone doesn't work well with her husband, makes him sick... would like something else sent to pharmacy if possible.... [facility] [address].  Initial call taken by: Debbra Riding,  March 13, 2010 11:15 AM  Follow-up for Phone Call        generic tessalon 200 mg  #21 one TID Follow-up by: Gordy Savers  MD,  March 13, 2010 12:52 PM  Additional Follow-up for Phone Call Additional follow up Details #1::        Rx Called In. left message on machine for patient  Additional Follow-up by: Kern Reap CMA Duncan Dull),  March 13, 2010 2:17 PM

## 2010-05-14 NOTE — Assessment & Plan Note (Signed)
Summary: 3 wk follow up/cjr/pt rescd//ccm   Vital Signs:  Patient profile:   55 year old male Height:      70.5 inches Weight:      250 pounds BMI:     35.49 Temp:     97.9 degrees F oral BP sitting:   160 / 100  (left arm) Cuff size:   regular  Vitals Entered By: Kern Reap CMA Duncan Dull) (January 06, 2010 9:14 AM) CC: follow-up visit   CC:  follow-up visit.  History of Present Illness: norm is a 55 year old male, who comes in today for reevaluation of hypertension.  He was originally on Tenoretic 50 -- 25 with good blood pressure control except he developed hypokalemia.  We therefore switched into the plain beta-blocker, however, this did not control his blood pressure.  We then switched to Cozaar 50, now 100.  BP still not controlled.  We discussed options.  Patient likes to go back on the Tenoretic, with a potassium supplement  Allergies: 1)  ! Codeine  Past History:  Past medical, surgical, family and social histories (including risk factors) reviewed for relevance to current acute and chronic problems.  Past Medical History: Reviewed history from 09/01/2007 and no changes required. Hypertension ED Anxiety total hip replacement R&L  Past Surgical History: Reviewed history from 12/09/2006 and no changes required. Dislocated R Hip  Family History: Reviewed history from 12/09/2006 and no changes required. Family History Hypertension Family History Other cancer-colon Family History of Asthma Family History of Prostate CA 1st degree relative <50  Social History: Reviewed history from 12/09/2006 and no changes required. Never Smoked Alcohol use-no Drug use-no Married  Review of Systems      See HPI  Physical Exam  General:  Well-developed,well-nourished,in no acute distress; alert,appropriate and cooperative throughout examination Heart:  160/94 right arm sitting position........ the same with his digital cuff   Impression &  Recommendations:  Problem # 1:  HYPERTENSION (ICD-401.9) Assessment Unchanged  The following medications were removed from the medication list:    Cozaar 50 Mg Tabs (Losartan potassium) .[name]... Take 1 tablet by mouth every morning His updated medication list for this problem includes:    Cozaar 100 Mg Tabs (Losartan potassium) .[name]... Take 1 tablet by mouth every morning    Tenoretic 50 50-25 Mg Tabs (Atenolol-chlorthalidone) .[name]... Take 1 tablet by mouth every morning  Orders: Prescription Created Electronically [phone]) Prescription Created Electronically [id number])  Complete Medication List: 1)  Cozaar 100 Mg Tabs (Losartan potassium) .... Take 1 tablet by mouth every morning 2)  Tenoretic 50 50-25 Mg Tabs (Atenolol-chlorthalidone) .... Take 1 tablet by mouth every morning 3)  Effer-k 20 Meq Tbef (Potassium bicarb-citric acid) .... Take 1 tablet by mouth every morning  Patient Instructions: 1)  stop the Cozaar. 2)  We start the Tenoretic 5025 dose one daily, and a potassium supplement, one daily.  Return in 3 weeks for follow-up Prescriptions: EFFER-K 20 MEQ TBEF (POTASSIUM BICARB-CITRIC ACID) Take 1 tablet by mouth every morning  #100 x 3   Entered and Authorized by:   Roderick Pee MD   Signed by:   Roderick Pee MD on 01/06/2010   Method used:   [facility]  [address] [phone][facility] ([facility]ail)       [address]       [place], [place]  [address]       Ph: [phone]  or [phone]       Fax: [phone]   RxID:   [id number] TENORETIC 50 50-25 MG TABS (ATENOLOL-CHLORTHALIDONE) Take 1 tablet by mouth every morning  #100 x 3   Entered and Authorized by:   Roderick Pee MD   Signed by:   Roderick Pee MD on 01/06/2010   Method used:   [facility]        [facility]  [address] [phone]* (retail)       [address]       [place], [place]  [id number]       Ph: [phone] or [phone]       Fax:  [phone]   RxID:   [id number]

## 2010-05-14 NOTE — Assessment & Plan Note (Signed)
Summary: FLU-LIKE SYMPTOMS // RS   Vital Signs:  Patient profile:   55 year old male Weight:      241 pounds Temp:     98.9 degrees F oral BP sitting:   120 / 90  (left arm) Cuff size:   regular  Vitals Entered By: Kern Reap CMA Duncan Dull) (April 17, 2009 3:50 PM)  Reason for Visit cough, chills, HA  History of Present Illness: norm is a 55 year old, married male, nonsmoker, [employer] driver, who comes in with 8 month history of a congestion, postnasal drip, and cough.  He said the fever, earache, or viral symptoms.  He said history of allergic rhinitis in the past.  Allergies: 1)  ! Codeine  Past History:  Past medical, surgical, family and social histories (including risk factors) reviewed for relevance to current acute and chronic problems.  Past Medical History: Reviewed history from 09/01/2007 and no changes required. Hypertension ED Anxiety total hip replacement R&L  Past Surgical History: Reviewed history from 12/09/2006 and no changes required. Dislocated R Hip  Family History: Reviewed history from 12/09/2006 and no changes required. Family History Hypertension Family History Other cancer-colon Family History of Asthma Family History of Prostate CA 1st degree relative <50  Social History: Reviewed history from 12/09/2006 and no changes required. Never Smoked Alcohol use-no Drug use-no Married  Review of Systems      See HPI  Physical Exam  General:  Well-developed,well-nourished,in no acute distress; alert,appropriate and cooperative throughout examination Head:  Normocephalic and atraumatic without obvious abnormalities. No apparent alopecia or balding. Eyes:  No corneal or conjunctival inflammation noted. EOMI. Perrla. Funduscopic exam benign, without hemorrhages, exudates or papilledema. Vision grossly normal. Ears:  External ear exam shows no significant lesions or deformities.  Otoscopic examination reveals clear canals, tympanic membranes are  intact bilaterally without bulging, retraction, inflammation or discharge. Hearing is grossly normal bilaterally. Nose:  External nasal examination shows no deformity or inflammation. Nasal mucosa are pink and moist without lesions or exudates. Mouth:  Oral mucosa and oropharynx without lesions or exudates.  Teeth in good repair. Neck:  No deformities, masses, or tenderness noted. Chest Wall:  No deformities, masses, tenderness or gynecomastia noted. Lungs:  Normal respiratory effort, chest expands symmetrically. Lungs are clear to auscultation, no crackles or wheezes.   Impression & Recommendations:  Problem # 1:  ALLERGIC RHINITIS (ICD-477.9) Assessment Deteriorated  Orders: Prescription Created Electronically [phone])  Complete Medication List: 1)  Cialis 20 Mg Tabs (Tadalafil) .... Uad 2)  Cozaar 50 Mg Tabs (Losartan potassium) .... Take 1 tablet by mouth every morning 3)  Prostate 5lx  .... Two times a day 4)  Prednisone 20 Mg Tabs (Prednisone) .... Uad  Patient Instructions: 1)  begin prednisone, one tablet x 5 days, a half a tablet a day, x 5 days, then half a tablet Monday, Wednesday, Friday, for a two week taper.  When you finish the prednisone begin plain Claritin 10 mg daily Prescriptions: PREDNISONE 20 MG TABS (PREDNISONE) UAD  #30 x 1   Entered and Authorized by:   Roderick Pee MD   Signed by:   Roderick Pee MD on 04/17/2009   Method used:   [facility]        [facility]  [address] [phone]* (retail)       [address]       [place], [place]  [id number]       Ph:  [phone] or [phone]       Fax: [phone]   RxID:   [id number]

## 2010-05-14 NOTE — Progress Notes (Signed)
Summary: Constipation & bright red blood   Phone Note From Other Clinic   Caller: Aurther Loft  at Dr Tawanna Cooler [phone] Call For: Dr Arlyce Dice Reason for Call: Schedule Patient Appt Summary of Call: Appt asap for intermittent bright red blood & constipation Initial call taken by: Leanor K[facility],  April 20, 2010 11:32 AM  Follow-up for Phone Call        Left message to call back Follow-up by: Selinda Michaels RN,  April 20, 2010 11:48 AM  Additional Follow-up for Phone Call Additional follow up Details #1::        Appt made with Willette Cluster, RNP for 04/21/10 @9am . Additional Follow-up by: Selinda Michaels RN,  April 20, 2010 12:01 PM

## 2010-05-14 NOTE — Assessment & Plan Note (Signed)
Summary: Rectal bleeding/Constipation/LRH    History of Present Illness Visit Type: Initial Consult Primary GI MD: Melvia Heaps MD [facility] Primary Provider: Gasper Lloyd Requesting Provider: Tinnie Gens Todd,MD Chief Complaint: Pt having stomach pain, wants to discuss having recall colonoscopy due to mother passing with colon cancer. Pt actually not due for colonoscopy untill 2013 History of Present Illness:   Patient underwent screening colonoscopy with Dr. Arlyce Dice in 2008 for family medical history of CRC. His examination was normal except for a lipoma in ascending colon.  He is referred here today for a 1.5 month history abdominal pain, bowel habit change and occassional rectal bleeding. His pain is, located in mid abdomen, more of an aggravating type pain, without any aggravating or alleviating factors. The change in bowel habits is described as smaller volume stools.  Over the last six months patient has had intermittent rectal bleeding (tissue).   No nausea. Weight fluctuates    GI Review of Systems    Reports abdominal pain, belching, and  bloating.       Reports change in bowel habits, constipation, and  rectal bleeding.     Denies anal fissure, black tarry stools, diarrhea, diverticulosis, fecal incontinence, heme positive stool, hemorrhoids, irritable bowel syndrome, jaundice, light color stool, liver problems, and  rectal pain.    Current Medications (verified): 1)  Atenolol-Chlorthalidone 50-25 Mg Tabs (Atenolol-Chlorthalidone) .... Take One Tab Daily  Allergies (verified): 1)  ! Codeine  Past History:  Past Medical History: Reviewed history from 09/01/2007 and no changes required. Hypertension ED Anxiety total hip replacement R&L  Past Surgical History: Dislocated R Hip 2 hip replacements  Family History: Family History Hypertension Family History Other cancer-colon mother Family History of Asthma Family History of Prostate CA 1st degree relative <50  Social  History: Never Smoked Alcohol use-no Drug use-no Married Occupation: delivery driver Daily Caffeine Use  Review of Systems  The patient denies allergy/sinus, anemia, anxiety-new, arthritis/joint pain, back pain, blood in urine, breast changes/lumps, change in vision, confusion, cough, coughing up blood, depression-new, fainting, fatigue, fever, headaches-new, hearing problems, heart murmur, heart rhythm changes, itching, muscle pains/cramps, night sweats, nosebleeds, shortness of breath, skin rash, sleeping problems, sore throat, swelling of feet/legs, swollen lymph glands, thirst - excessive, urination - excessive, urination changes/pain, urine leakage, vision changes, and voice change.    Vital Signs:  Patient profile:   55 year old male Height:      70.5 inches Weight:      263 pounds BMI:     37.34 BSA:     2.36 Pulse rate:   58 / minute Pulse rhythm:   regular BP sitting:   94 / 70  (left arm)  Vitals Entered By: Merri Ray CMA Duncan Dull) (April 21, 2010 8:55 AM)  Physical Exam  General:  Well developed, well nourished, no acute distress. Head:  Normocephalic and atraumatic. Eyes:  Conjunctiva pink, no icterus.  Mouth:  No oral lesions. Tongue moist.  Neck:  Supple; no masses. Lungs:  Clear throughout to auscultation. Heart:  Regular rate and rhythm; no murmurs, rubs,  or bruits. Abdomen:  Abdomen soft, nontender, nondistended. No obvious masses or hepatomegaly.Normal bowel sounds.  Rectal:  No external or internal lesions appreciated. Brown, heme negative stool. Msk:  Symmetrical with no gross deformities. Normal posture. Extremities:  .No palmar erythema, no edema.  Neurologic:  Alert and  oriented x4;  grossly normal neurologically. Skin:  Intact without significant lesions or rashes. Cervical Nodes:  No significant cervical adenopathy. Psych:  Alert  and cooperative. Normal mood and affect.   Impression & Recommendations:  Problem # 1:  RECTAL BLEEDING  (ICD-569.3) Assessment Deteriorated Six month history of occasional rectal bleeding, scant amount on tissue. His examination, including anoscopy was unremarkable today. In addion patient has developed a change in bowel habits and mid abdominal pain. His pain and bowel change may be functional and bleeding perianal in nature but given his [facility] of CRC his symptoms warrant further investigation. The patient will be scheduled for a colonoscopy with biopsies/polypectomy (if indicated).  The risks and benefits of the procedure, as well as alternatives were discussed with the patient and he agrees to proceed.    Orders: Colonoscopy (Colon)  Problem # 2:  CHANGE IN BOWELS (WUJ-811.91) Assessment: New As above.  Problem # 3:  ABDOMINAL PAIN -GENERALIZED (ICD-789.07) Assessment: New See #1/  Problem # 4:  COLORECTAL CANCER, FAMILY HX (ICD-V16.0) Assessment: Comment Only  Orders: Colonoscopy (Colon) Prescriptions: MOVIPREP 100 GM  SOLR (PEG-KCL-NACL-NASULF-NA ASC-C) As per prep instructions.  #1 x 0   Entered by:   Lowry Ram NCMA   Authorized by:   Willette Cluster NP   Signed by:   Lowry Ram NCMA on 04/21/2010   Method used:   [facility]        [facility]  [address] [phone][facility] (retail)       [address]       [place], [place]  [id number]       Ph: [phone] or [phone]       Fax: [phone]   RxID:   [id number]

## 2010-05-20 ENCOUNTER — Other Ambulatory Visit: Payer: Self-pay | Admitting: *Deleted

## 2010-05-20 DIAGNOSIS — I10 Essential (primary) hypertension: Secondary | ICD-10-CM

## 2010-05-20 MED ORDER — AMLODIPINE BESYLATE 5 MG PO TABS: 5.0000 mg | ORAL_TABLET | Freq: Every day | ORAL | Status: DC

## 2010-05-25 ENCOUNTER — Encounter: Payer: Self-pay | Admitting: Family Medicine

## 2010-05-25 ENCOUNTER — Ambulatory Visit (INDEPENDENT_AMBULATORY_CARE_PROVIDER_SITE_OTHER): Payer: BC Managed Care – PPO | Admitting: Family Medicine

## 2010-05-25 VITALS — BP 130/98 | Temp 98.0°F | Ht 72.0 in | Wt 268.0 lb

## 2010-05-25 DIAGNOSIS — J069 Acute upper respiratory infection, unspecified: Secondary | ICD-10-CM

## 2010-05-25 DIAGNOSIS — I10 Essential (primary) hypertension: Secondary | ICD-10-CM

## 2010-05-25 MED ORDER — LOSARTAN POTASSIUM 100 MG PO TABS: 100.0000 mg | ORAL_TABLET | Freq: Every day | ORAL | Status: DC

## 2010-05-25 MED ORDER — HYDROCODONE-HOMATROPINE 5-1.5 MG/5ML PO SYRP: 5.0000 mL | ORAL_SOLUTION | Freq: Four times a day (QID) | ORAL | Status: AC | PRN

## 2010-05-25 NOTE — Patient Instructions (Signed)
Drink lots of liquids.  Tylenol for fever and chills.  Hydromet one half to 1 teaspoon up to 3 times a day as needed for cough

## 2010-05-25 NOTE — Progress Notes (Signed)
  Subjective:    Patient ID: Jonathan [name], male    DOB: [birth year], 55 y.o.   MRN: [id number]  HPI Jonathan [name] is a 55 year old, married male, nonsmoker, who comes in with a 4-day history of fever, chills, head, congestion, and nonproductive cough.  Review of systems negative   Review of Systems     Objective:   Physical Exam    Well-developed well-nourished, male in no acute distress.  Examination of the HEENT were negative.  Neck was supple.  No adenopathy.  Lungs are clear    Assessment & Plan:  Viral syndrome.  Plan lots of liquids Tylenol for fever, chills, Hydramine p.r.n. cough

## 2010-06-08 ENCOUNTER — Other Ambulatory Visit (AMBULATORY_SURGERY_CENTER): Payer: BC Managed Care – PPO | Admitting: Gastroenterology

## 2010-06-08 ENCOUNTER — Encounter: Payer: Self-pay | Admitting: Gastroenterology

## 2010-06-08 ENCOUNTER — Other Ambulatory Visit: Payer: Self-pay | Admitting: Gastroenterology

## 2010-06-08 DIAGNOSIS — K5730 Diverticulosis of large intestine without perforation or abscess without bleeding: Secondary | ICD-10-CM

## 2010-06-08 DIAGNOSIS — R198 Other specified symptoms and signs involving the digestive system and abdomen: Secondary | ICD-10-CM

## 2010-06-08 DIAGNOSIS — D126 Benign neoplasm of colon, unspecified: Secondary | ICD-10-CM

## 2010-06-08 DIAGNOSIS — Z800 Family history of malignant neoplasm of digestive organs: Secondary | ICD-10-CM

## 2010-06-08 DIAGNOSIS — K625 Hemorrhage of anus and rectum: Secondary | ICD-10-CM

## 2010-06-08 DIAGNOSIS — R109 Unspecified abdominal pain: Secondary | ICD-10-CM

## 2010-06-11 ENCOUNTER — Encounter: Payer: Self-pay | Admitting: Gastroenterology

## 2010-06-18 NOTE — Letter (Signed)
Summary: Patient Notice- Polyp Results  [facility]  [address] [place], [place] [address]   Phone: [phone]  Fax: [phone]        June 11, 2010 MRN: [id number]    Jonathan [name] [address] [place], [place]  [address]    Dear Jonathan [name],  I am pleased to inform you that the colon polyp(s) removed during your recent colonoscopy was (were) found to be benign (no cancer detected) upon pathologic examination.  I recommend you have a repeat colonoscopy examination in 5_ years to look for recurrent polyps, as having colon polyps increases your risk for having recurrent polyps or even colon cancer in the future.  Should you develop new or worsening symptoms of abdominal pain, bowel habit changes or bleeding from the rectum or bowels, please schedule an evaluation with either your primary care physician or with me.  Additional information/recommendations:  __ No further action with gastroenterology is needed at this time. Please      follow-up with your primary care physician for your other healthcare      needs.  __ Please call [phone] to schedule a return visit to review your      situation.  __ Please keep your follow-up visit as already scheduled.  _x_ Continue treatment plan as outlined the day of your exam.  Please call us if you are having persistent problems or have questions about your condition that have not been fully answered at this time.  Sincerely,  Louis Meckel MD  This letter has been electronically signed by your physician.  Appended Document: Patient Notice- Polyp Results letter mailed

## 2010-06-18 NOTE — Letter (Signed)
Summary: Appt Reminder 2  [facility]  [address]   [place], [place] [address]   Phone: [phone]  Fax: [phone]        June 08, 2010 MRN: [id number]    Jonathan [name] [address] [place], [place]  [address]    Dear Jonathan [name],   You have a return appointment with Dr. Arlyce Dice  on 08/03/10 at 8:45am.  Please remember to bring a complete list of the medicines you are taking, your insurance card and your co-pay.  If you have to cancel or reschedule this appointment, please call before 5:00 pm the evening before to avoid a cancellation fee.  If you have any questions or concerns, please call [phone].    Sincerely,    Selinda Michaels RN  Appended Document: Appt Reminder 2 Letter is mailed to the patient's home address

## 2010-06-18 NOTE — Miscellaneous (Signed)
Summary: rx for anusol  Clinical Lists Changes  Medications: Added new medication of ANUSOL-HC 25 MG  SUPP (HYDROCORTISONE ACETATE) one at bedtime prn - Signed Rx of ANUSOL-HC 25 MG  SUPP (HYDROCORTISONE ACETATE) one at bedtime prn;  #30 x 0;  Signed;  Entered by: Weston Brass RN;  Authorized by: Louis Meckel MD;  Method used: [facility] [phone]*, [address] [place], [place], [place]  [id number], Ph: [phone] or [phone], Fax: [phone] Observations: Added new observation of ALLERGY REV: Done (06/08/2010 9:03)    Prescriptions: ANUSOL-HC 25 MG  SUPP (HYDROCORTISONE ACETATE) one at bedtime prn  #30 x 0   Entered by:   Weston Brass RN   Authorized by:   Louis Meckel MD   Signed by:   Weston Brass RN on 06/08/2010   Method used:   [facility]        [facility]  [address] [phone]* (retail)       [address]       [place], [place]  [id number]       Ph: [phone] or [phone]       Fax: [phone]   RxID:   [id number]

## 2010-06-18 NOTE — Procedures (Signed)
Summary: Colonoscopy  Patient: Jonathan [name] Note: All result statuses are Final unless otherwise noted.  Tests: (1) Colonoscopy (COL)   COL Colonoscopy           DONE     [facility]     [address]     [place], [place]  [address]           COLONOSCOPY PROCEDURE REPORT           PATIENT:  Jonathan [name], Jonathan [name]  MR#:  [id number]     BIRTHDATE:  12-22-1955, 54 yrs. old  GENDER:  male           ENDOSCOPIST:  Barbette Hair. Arlyce Dice, MD     Referred by:           PROCEDURE DATE:  06/08/2010     PROCEDURE:  Colonoscopy with snare polypectomy     ASA CLASS:  Class I     INDICATIONS:  1) rectal bleeding  2) change in bowel habits           MEDICATIONS:   Fentanyl 75 mcg IV, Versed 9 mg IV           DESCRIPTION OF PROCEDURE:   After the risks benefits and     alternatives of the procedure were thoroughly explained, informed     consent was obtained.  Digital rectal exam was performed and     revealed no abnormalities.   The LB 180AL [id number] endoscope was     introduced through the anus and advanced to the cecum, which was     identified by both the appendix and ileocecal valve, without     limitations.  The quality of the prep was excellent, using     MoviPrep.  The instrument was then slowly withdrawn as the colon     was fully examined.     <<PROCEDUREIMAGES>>           FINDINGS:  A pedunculated polyp was found in the ascending colon.     It was 4 mm in size. Polyp was snared, then cauterized with     monopolar cautery. Retrieval was successful (see image5). snare     polyp  A lipoma was found in the ascending colon (see image7).     Diverticula were found in the descending colon (see image13). Rare     diverticulum  This was otherwise a normal examination of the colon     (see image1, image2, image6, image8, image10, image17, and     image19).   Retroflexed views in the rectum revealed no     abnormalities.    The time to cecum =  2.25  minutes. The scope     was then  withdrawn (time =  7.25  min) from the patient and the     procedure completed.           COMPLICATIONS:  None           ENDOSCOPIC IMPRESSION:     1) 4 mm pedunculated polyp in the ascending colon     2) Lipoma in the ascending colon     3) Diverticula in the descending colon     4) Otherwise normal examination           Limited rectal bleeding secondary tohemorrhoids           RECOMMENDATIONS:     1) high fiber diet     2) Anusol HC supp. prn  3) call office next 1-3 days to schedule followup visit in 1     month     4) Given your significant family history of colon cancer, you     should have a repeat colonoscopy in 5 years           REPEAT EXAM:   5 year(s) Colonoscopy           ______________________________     Barbette Hair. Arlyce Dice, MD           CC:  Roderick Pee, MD           n.     Rosalie Doctor:   Barbette Hair. Kaplan at 06/08/2010 09:00 AM           Omer Jack, [id number]  Note: An exclamation mark (!) indicates a result that was not dispersed into the flowsheet. Document Creation Date: 06/08/2010 9:00 AM _______________________________________________________________________  (1) Order result status: Final Collection or observation date-time: 06/08/2010 08:50 Requested date-time:  Receipt date-time:  Reported date-time:  Referring Physician:   Ordering Physician: Melvia Heaps [phone]) Specimen Source:  Source: Launa Grill Order Number: [id number] Lab site:   Appended Document: Colonoscopy     Procedures Next Due Date:    Colonoscopy: 06/2015

## 2010-07-22 ENCOUNTER — Ambulatory Visit (INDEPENDENT_AMBULATORY_CARE_PROVIDER_SITE_OTHER): Payer: BC Managed Care – PPO | Admitting: Gastroenterology

## 2010-07-22 ENCOUNTER — Encounter: Payer: Self-pay | Admitting: Gastroenterology

## 2010-07-22 DIAGNOSIS — K625 Hemorrhage of anus and rectum: Secondary | ICD-10-CM

## 2010-07-22 DIAGNOSIS — D126 Benign neoplasm of colon, unspecified: Secondary | ICD-10-CM

## 2010-07-22 DIAGNOSIS — Z800 Family history of malignant neoplasm of digestive organs: Secondary | ICD-10-CM | POA: Insufficient documentation

## 2010-07-22 NOTE — Progress Notes (Signed)
History of Present Illness:  Jonathan [name] has returned following colonoscopy. An adenomatous polyp was removed. Limited rectal bleeding was felt secondary to hemorrhoids. His only complaint at this time is abdominal and leg cramps. They occur at rest. It may last seconds to minutes at a time.    Review of Systems: Pertinent positive and negative review of systems were noted in the above HPI section. All other review of systems were otherwise negative.    Current Medications, Allergies, Past Medical History, Past Surgical History, Family History and Social History were reviewed in [employer] electronic medical record  Vital signs were reviewed in today's medical record. Physical Exam: General: Well developed , well nourished, no acute distress

## 2010-07-22 NOTE — Assessment & Plan Note (Signed)
Follow up colonoscopy in 5 years.

## 2010-07-22 NOTE — Patient Instructions (Addendum)
Cc.Jeffrey Todd,MD Please call the office as needed. Asked the pharmacist about medicines for muscle cramps.  Hemorrhoids Hemorrhoids are dilated (enlarged) veins around the rectum. Sometimes clots will form in the veins. This makes them swollen and painful. These are called thrombosed hemorrhoids. Causes of hemorrhoids include:  Pregnancy: this increases the pressure in the hemorrhoidal veins.   Constipation.   Straining to have a bowel movement.  HOME CARE INSTRUCTIONS  Eat a well balanced diet and drink 6 to 8 glasses of water every day to avoid constipation. You may also use a bulk laxative.   Avoid straining to have bowel movements.   Keep anal area dry and clean.   Only take over-the-counter or prescription medicines for pain, discomfort, or fever as directed by your caregiver.  If thrombosed:  Take hot sitz baths for 20 to 30 minutes, 3 to 4 times per day.   If the hemorrhoids are very tender and swollen, place ice packs on area as tolerated. Using ice packs between sitz baths may be helpful. Fill a plastic bag with ice and use a towel between the bag of ice and your skin.   Special creams and suppositories (Anusol, Nupercainal, Wyanoids) may be used or applied as directed.   Do not use a donut shaped pillow or sit on the toilet for long periods. This increases blood pooling and pain.   Move your bowels when your body has the urge; this will require less straining and will decrease pain and pressure.   Only take over-the-counter or prescription medicines for pain, discomfort, or fever as directed by your caregiver.  SEEK MEDICAL CARE IF:  You have increasing pain and swelling that is not controlled with your prescription.   You have uncontrolled bleeding.   You have an inability or difficulty having a bowel movement.   You have pain or inflammation outside the area of the hemorrhoids.   You have chills and/or an oral temperature above 100 that lasts for 2 days or  longer, or as your caregiver suggests.  MAKE SURE YOU:   Understand these instructions.   Will watch your condition.   Will get help right away if you are not doing well or get worse.  Document Released: 03/26/2000 Document Re-Released: 03/11/2008 [facility] Patient Information 2011 [place], [place].

## 2010-07-22 NOTE — Assessment & Plan Note (Signed)
Limited rectal bleeding is due to hemorrhoids. He will take suppositories as needed.

## 2010-07-22 NOTE — Assessment & Plan Note (Signed)
Plan colonoscopy every 5 years 

## 2010-08-03 ENCOUNTER — Ambulatory Visit: Payer: BC Managed Care – PPO | Admitting: Gastroenterology

## 2010-08-25 NOTE — Op Note (Signed)
NAME:  Jonathan [name], Jonathan [name] NO.:  [id number]   MEDICAL RECORD NO.:  [id number]          PATIENT TYPE:  INP   LOCATION:  2550                         FACILITY:  [facility]   PHYSICIAN:  Lubertha Basque. Dalldorf, M.D.DATE OF BIRTH:  November 28, 1955   DATE OF PROCEDURE:  04/18/2007  DATE OF DISCHARGE:                               OPERATIVE REPORT   PREOPERATIVE DIAGNOSES:  1. Left hip degenerative joint disease.  2. Left hip retained hardware.   POSTOPERATIVE DIAGNOSES:  1. Left hip degenerative joint disease.  2. Left hip retained hardware.   PROCEDURES:  1. Left total hip replacement.  2. Left hip removal hardware.   ANESTHESIA:  General pain.   SURGEON:  Lubertha Basque. Jerl Santos, M.D.   ASSISTANT:  Lindwood Qua, P.A.   INDICATIONS FOR PROCEDURE:  The patient is a 55 year old man with a long  history of bilateral hip difficulty.  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  He has advanced degenerative change at this  point and has pain which limits his ability to rest and walk, and he is  offered a hip replacement.  He had the same procedure on the opposite  side in the past year and that has done very well.  Informed operative  consent was obtained after discussion of possible complications of,  reaction to anesthesia, infection, DVT, PE, dislocation, and death.   SUMMARY OF FINDINGS AND PROCEDURE:  Under general anesthesia, through an  extended posterior approach, we addressed his left hip problem.  We  first dissected down to a Knowles pin which was buried in bone.  There  was one small portion of this which was exposed.  We had to use an  osteotome and rongeur to remove bone around the head of this pin and  then removed it with pliers.  We then performed a further posterior  approach to the hip and replaced this using a DePuy ASR system with  components identical to the opposite side which were 6 high-offset  Summit stem with a 54 ASR cup.  We used a +2  size 47 ASR head.  Bryna Colander assisted throughout and was invaluable to completion of the  case, in that he helped position and retract while I performed the  procedure.  He also closed simultaneously to help minimize OR time.   DESCRIPTION OF PROCEDURE:  The patient was taken to the operating suite  where general anesthetic was applied without difficulty.  He was  positioned in a lateral decubitus position with a left hip up.  Bony  prominences were appropriately padded, hip positioners were utilized,  and an axillary roll was placed.  He was then prepped and draped in  normal sterile fashion.  After administration of IV Kefzol, a posterior  approach was taken to left hip.  We did extend this down the leg  slightly.  Dissection was carried down to the IT band and gluteus  maximus fascia which were split longitudinally.  I then dissected up  some of the vastus lateralis and could see a small glint of stainless  steel.  We dissected around this and did find a Knowles pin head which  was buried in bone.  We dissected this out with osteotomes and removed  with needle nose pliers with some moderate difficulty.  I then tagged  and reflected the external rotators.  A posterior capsulectomy was  performed and the hip was dislocated.  I made a femoral neck cut just  above the lesser trochanter.  He had significant deformation of the  femoral head but excellent bone quality.  We exposed the acetabulum  fully and reamed to the medial wall, followed by sequential up reaming  to 53.  We then placed a 54 ASR cup in appropriate tilt and anteversion.  Attention was then turned toward the femur.  I reamed up to a 6 and then  broached to the same size.  We performed the trial reduction with this  component and the high-offset +2 neck.  He was stable in extension with  external rotation, and flexion with internal rotation, and leg lengths  were felt to be roughly equal.  This trial component was  removed,  followed by placement of a 6 high-offset Summit stem in appropriate  anteversion.  This was capped with a +2 47 ASR head and neck assembly.  The hip again was reduced and was stable.  The wound was irrigated,  followed by reapproximation of the short external rotators to the  greater trochanteric region with nonabsorbable suture.  IT band and  gluteus maximus fascia reapproximated with #1 Vicryl in interrupted  fashion.  Subcutaneous tissues were reapproximated in several layers  with 0 and 2-0 undyed Vicryl, followed by skin closure with staples.  Adaptic was applied, followed by dry gauze and tape.  Estimated blood  loss was 200 mL and intraoperative fluids can be obtained from  anesthesia records.   DISPOSITION:  The patient was extubated in the operating room and taken  to recovery in stable addition.  He was to be admitted for orthopedic  surgery service for appropriate postop care to include perioperative  antibiotics and Coumadin, plus Lovenox for DVT prophylaxis.      Lubertha Basque Jerl Santos, M.D.  Electronically Signed     PGD/MEDQ  D:  04/18/2007  T:  04/18/2007  Job:  [id number]

## 2010-08-28 NOTE — Discharge Summary (Signed)
NAME:  Jonathan [name], Jonathan [name] NO.:  [id number]   MEDICAL RECORD NO.:  [id number]          PATIENT TYPE:  INP   LOCATION:  5030                         FACILITY:  [facility]   PHYSICIAN:  Lubertha Basque. Dalldorf, M.D.DATE OF BIRTH:  09-08-1955   DATE OF ADMISSION:  04/18/2007  DATE OF DISCHARGE:  04/22/2007                               DISCHARGE SUMMARY   ADMISSION DIAGNOSES:  1. Left hip end-stage degenerative joint disease.  2. Hypertension.  3. Status post right hip replacement.   DISCHARGE DIAGNOSES:  1. Left hip end-stage degenerative joint disease.  2. Hypertension.  3. Status post right hip replacement.  4. Hypokalemia.   BRIEF HISTORY:  Rynell is a 55 year old black male patient well-known to  our practice.  We have replaced his right hip some time ago, and he had  done very well.  Now, his left hip is worn out.  On x-ray, he is bone-on-  bone, end-stage DJD.  Pain with walking and sleeping, and we have  discussed replacing his hip.   PERTINENT LABORATORY AND X-RAY FINDINGS:  WBC 15.6, hemoglobin 10.2,  platelets 280.  Serial INRs were done, as he was on low-dose Coumadin  protocol, last test 1.7.  Potassium 2.8.  It did raise to 3.4 on  discharge.  Glucose 95, BUN 9, creatinine 0.91.   HOSPITAL COURSE:  Postoperatively, he was kept on atenolol 25 mg one a  day which was his home medicine.  He also was on Coumadin and Lovenox  protocol per pharmacy for DVT prophylaxis, IV Ancef, a gram q.8h. x3  doses, and then various other oral medications, ferrous sulfate,  Percocet, Robaxin as needed.  Foley catheter was discontinued on the  first day postop.  He can be touchdown weightbearing with the help of  physical therapy.  Dressing reinforced.  He was on nasal cannula O2,  knee-high TEDs, and incentive spirometer.  The first day postop, his  blood pressure was 110/75, temperature 97.  Lungs were clear.  Abdomen  was soft.  Hemoglobin 10.0.  Potassium was 2.9. Foley  catheter was  removed.  He was eating and voiding well afterwards.  On the second day  postop, his potassium dropped lower.  He was supplemented orally and  then later with runs of potassium.  We kept a close check on this, and  it eventually came up to within close to normal limits.  His dressing  was changed the second day postop. His wound was noted to be benign, and  at no time in the hospital did he have any signs of hip infection.  He  was discharged home.   DISCHARGE MEDICATIONS:  1. He will remain on atenolol 25 mg one a day.  2. He is given a potassium supplement 40 mEq a day.  3. Pain medication with Percocet 1-2 q.4-6h. p.r.n. pain.  4. Coumadin dose regulated by pharmacy.   FOLLOW UP:  He will follow up with his family practice doctor in regards  to his potassium within 7-10 days.  He will follow up with Dr. Jerl Santos  in 7-10 days.   DISCHARGE  INSTRUCTIONS:  He may change his dressing daily.  Low-sodium,  heart-healthy diet.  He can be touchdown weightbearing, and [facility] will draw his blood for INR and for potassium check as well as  provide home physical therapy.      Lindwood Qua, P.A.      Lubertha Basque Jerl Santos, M.D.  Electronically Signed    MC/MEDQ  D:  05/09/2007  T:  05/09/2007  Job:  [id number]

## 2010-08-28 NOTE — Op Note (Signed)
NAMEEUELL, SCHIFF NO.:  [id number]   MEDICAL RECORD NO.:  [id number]          PATIENT TYPE:  INP   LOCATION:  2899                         FACILITY:  [facility]   PHYSICIAN:  Lubertha Basque. Dalldorf, M.D.DATE OF BIRTH:  [birth year]   DATE OF PROCEDURE:  07/12/2006  DATE OF DISCHARGE:                               OPERATIVE REPORT   PREOPERATIVE DIAGNOSIS:  Right hip degenerative arthritis.   POSTOPERATIVE DIAGNOSIS:  Right hip degenerative arthritis.   PROCEDURE:  Right total hip replacement.   ANESTHESIA:  General.   ATTENDING SURGEON:  Lubertha Basque. Jerl Santos, M.D.   ASSISTANT:  Lindwood Qua, P.A.   INDICATIONS FOR PROCEDURE:  The patient is a 55 year old man with long  history of bilateral hip pain.  He had what sounds SCFE as a adolescent  and had pinnings on both sides.  The hardware was eventually removed  from the right hip.  For years, he is had disabling pain on both sides.  This has persisted despite many different oral anti-inflammatories.  He  has pain which limits his ability to rest and walk and work.  By x-ray,  he has end-stage degenerative change bilaterally.  He is offered a hip  replacement on the right, which is the most painful side.  Informed  operative consent was obtained after discussion of possible  complications of reaction to anesthesia, infection, DVT, PE,  dislocation, and death.   SUMMARY OF FINDINGS AND PROCEDURE:  Under general anesthesia, through a  posterior approach, a right total hip replacement was performed.  He had  end-stage degenerative change but excellent bone quality.  He had some  significant osteophytes which were removed followed by a hip replacement  operation using DePuy porous coated components.  We made an especially  high femoral neck cut to address his valgus alignment and ended up  placing a size 6 high offset Summit stem capped with a +2 spacer and a  47 ASR hip ball.  This fit into a 54 ASR cup.  Bryna Colander assisted  throughout and was invaluable to completion of the case in that he  helped position and retract while I performed procedure.  He also closed  simultaneously to help minimize OR time.   DESCRIPTION OF PROCEDURE:  The patient was taken to the operating suite  where general anesthetic was applied without difficulty.  He was  positioned in the lateral decubitus position with the right hip up.  All  bony prominences were appropriately padded and axillary roll was used.  Hip positioners were utilized.  He was then prepped and draped in normal  sterile fashion.  After the administration of preoperative IV Kefzol, a  posterior approach was taken to the right hip.  All appropriate anti-  infective measures were used including closed hooded exhaust systems for  each member of the surgical team, Betadine impregnated drape, and  preoperative IV antibiotic.  A posterior approach was taken through an  abundance of adipose tissue to the IT band and gluteus maximus fascia.  The structures were incised longitudinally to expose the short external  rotators of the  hip which were tagged and reflected.  A posterior  capsulectomy was performed and the hip was dislocated.  The femoral neck  cut was made well above the lesser trochanter to address the valgus  alignment of the hip.  The femoral head was removed.  He had advanced  degenerative change and large osteophytes about the anterior and  posterior aspects of the acetabulum.  An osteotome was used to remove  some of the more prominent osteophytes.  Some loose bodies were removed  from the hip joint.  I took a small reamer and went in a medial  direction to the inside wall of the pelvis.  I then reamed up to size  53, followed by placement of a size 54 ASR cup in appropriate  anteversion and tilt.  Attention was then turned toward the femur.  We  took the entry point as lateral as possible and then reamed  appropriately.  We then used  sequential broaches up to size 6 which  seemed to fill the canal well in the proximal portion.  A trial  reduction was done with various components and the +2 high offset  assembly gave him excellent stability in external rotation and extension  as well as in internal rotation and flexion.  Leg lengths were judged to  be roughly equal.  The trial components removed, followed by placement  of the size 6 high offset Summit stem in slight anteversion.  We then  capped this with a +2 spacer and a 47 ASR ball.  The wound was  irrigated, followed by reduction and again things were stable in the  aforementioned positions and leg lengths were felt to be equal.  I  reapproximating the short external rotators of the hip to the greater  trochanteric region with nonabsorbable suture.  IT band and gluteus  maximus fascia reapproximated with #1 Vicryl in interrupted fashion,  followed by subcutaneous tissue with 0 and 2-0 undyed Vicryl in several  layers.  Skin was closed with staples.  Adaptic was applied, followed by  a sterile dressing.   ESTIMATED BLOOD LOSS:  350 mL.   INTRAOPERATIVE FLUIDS:  Can be obtained from anesthesia records.   DISPOSITION:  The patient was extubated in the operating room and taken  to recovery room in stable addition.  He was to be admitted to the  orthopedic surgery service for appropriate postop care to include  perioperative antibiotics and Coumadin plus Lovenox for DVT prophylaxis.      Lubertha Basque Jerl Santos, M.D.  Electronically Signed     PGD/MEDQ  D:  07/12/2006  T:  07/12/2006  Job:  [id number]

## 2010-08-28 NOTE — Op Note (Signed)
NAME:  Jonathan [name], Jonathan [name] NO.:  [id number]   MEDICAL RECORD NO.:  [id number]                   PATIENT TYPE:  AMB   LOCATION:  [facility]                                 FACILITY:  [facility] : [facility]   PHYSICIAN:  Jamison Neighbor, M.D.               DATE OF BIRTH:  [birth year]   DATE OF PROCEDURE:  07/27/2002  DATE OF DISCHARGE:                                 OPERATIVE REPORT   PREOPERATIVE DIAGNOSES:  Chronic prostatitis/chronic testicular pain  syndrome.   POSTOPERATIVE DIAGNOSES:  Chronic prostatitis/chronic testicular pain  syndrome.   PROCEDURE:  1. Cystoscopy.  2. Hydrodistention of the bladder.  3. Marcaine and Pyridium instillation.   SURGEON:  Jamison Neighbor, M.D.   ANESTHESIA:  General.   COMPLICATIONS:  None.   DRAINS:  None.   BRIEF HISTORY:  This 55 year old male has had long-standing problems with,  what has been felt to be, probable prostatitis.  The patient has had  testicular pain associated with this as well.  He has been treated with  antibiotic therapy, without significant improvement.  The patient has been  treated with a combination of long-term antibiotics, anti-inflammatories and  alpha blockers without improvement of his voiding dysfunction.  He is now  admitted to undergo diagnostic cystoscopy.  He understands the risks and  benefits of the procedure, and gave full and informed consent.   DESCRIPTION OF PROCEDURE:  After successful induction of general anesthesia,  the patient was placed in the dorsal lithotomy position and prepped with  Betadine, draped in the usual sterile fashion.  Examination under anesthesia  revealed the testicles were unremarkable in size, shape and consistency.  The epididymis is not enlarged on either side.  Cord structures were normal,  with no hydrocele, synostocele, varicocele, hernia or adenopathy detected.  The cystoscope was inserted.  The roots are visualized in entirety; found to  be normal beyond  the verumontanum.  The prostate was not particularly  enlarged; there was no real significant bladder outlet obstruction.  Bladder  neck appeared wide open.  The bladder was carefully inspected.  It was free  of any tumor or stones.  Both ureteral orifices were normal in configuration  and location.  The bladder was distended and held 1400 cc; and again, there  really is no indication of interstitial cystitis or bladder pathology.  When  the bladder was drained there was no bleeding detected.  Biopsies were not  required.  The patient had a rectal examination performed under anesthesia.  There was no significant expressed prostatic secretions, and the prostate  was palpably normal.  The patient did not have his bladder drained.  Marcaine and Pyridium were left in the bladder.    DISPOSITION:  The patient tolerated the procedure well and was taken to the  recovery room in good condition.  He will be sent home with Lorcet-Plus,  Pyridium-Plus and Levaquin.  He will  return in follow-up.                                               Jamison Neighbor, M.D.    RJE/MEDQ  D:  07/27/2002  T:  07/27/2002  Job:  [id number]   cc:   Tinnie Gens A. Tawanna Cooler, M.D. [facility]

## 2010-08-28 NOTE — Discharge Summary (Signed)
NAME:  COLLAN, SCHOENFELD NO.:  [id number]   MEDICAL RECORD NO.:  [id number]          PATIENT TYPE:  INP   LOCATION:  5002                         FACILITY:  [facility]   PHYSICIAN:  Lubertha Basque. Dalldorf, M.D.DATE OF BIRTH:  09/21/1955   DATE OF ADMISSION:  07/12/2006  DATE OF DISCHARGE:  07/16/2006                               DISCHARGE SUMMARY   ADMITTING DIAGNOSES:  1. Right hip end-stage degenerative joint disease.  2. History of hypertension.   DISCHARGE DIAGNOSES:  1. Right hip end-stage degenerative joint disease.  2. History of hypertension.  3. Hypokalemia during hospital stay.   BRIEF HISTORY:  This is a 55 year old white male patient well known to  my practice with increasing right hip pain who in his past medical  history had had slipped capital femoral epiphysis and subsequent surgery  and then later pin removal.  He has got significant osteoarthritic  change, trouble walking, trouble sleeping at nighttime.  He is an active  man.  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  His x-rays reveal end-stage  degeneration of his right hip.  I have discussed with him treatment  option, that being, total hip replacement.   PERTINENT LABORATORY AND X-RAY FINDINGS:  Sodium intraoperative 133,  potassium 2.9, and later after supplementation came back to 5.3, glucose  118, BUN 5, creatinine 0.83, calcium 8.4, serial CBCs done, hemoglobin  10.3, WBC 11. 2, hematocrit 31.4.  He is on low-dose Coumadin protocol,  so he had serial INRs done as well.  Also during his hospitalization,  there was a suspicion of a pulmonary embolus.  A spiral head CT scan was  done, and he was negative, and he had bradycardia on an EKG.   COURSE IN THE HOSPITAL:  He was admitted postoperatively and placed on a  variety of p.r.n. I.M. analgesics for pain, IV Ancef 1 gram q.8h. x3  doses.  Coumadin, and Lovenox protocol along with  other medications, and  iron sulfate and Reglan for nausea and muscle relaxation.  Physical  therapy was ordered for touch-down weightbearing status.   His first day postoperative, his vital signs were stable, and his wound  dressing was dry, and normal neurovascular status to his lower  extremities.   The second day, he was feeling bad, had some soreness in his chest, some  shortness of breath, and his saturations on O2 2 liters 98 and then with  room air were 85, so we were suspicious of possibly a PE, and again, a  spiral gut CT scan which was negative.  His dressing was changed on that  day as well as benign, no sign of infection, irritation, normal  neurovascular status.  His potassium was also low, so we have increased  his oral supplementation, and on the third day it remained low as well,  and ran some IV runs of potassium which brought it back up to 5.3, and  he was discharged home.   His condition at discharge is improved.  Dr. Tawanna Cooler is his  PCP doctor, and  he will follow up with him in two weeks.  He will follow up with Dr.  Jerl Santos in ten days, [phone].  He is to be touchdown weightbearing.  He may change his dressing daily.  Low-sodium diet.  Two medicines I  have given to him along with the pain medications, will be on Coumadin  for four weeks dose per pharmacy, Percocet 1 or 2 every four to six  hours p.r.n. pain, and then home care, I believe it was [facility] for his therapy and pro times.  If there is any sign of sign of  infection, he is to call our office at [phone].  He will continue on  his home medications which are:  1. Atenolol 50 mg once a day.  2. Hygroton 25 mg once a day.      Lindwood Qua, P.A.      Lubertha Basque Jerl Santos, M.D.  Electronically Signed    MC/MEDQ  D:  07/16/2006  T:  07/17/2006  Job:  [id number]

## 2010-12-31 LAB — COMPREHENSIVE METABOLIC PANEL
Albumin: 2.5 — ABNORMAL LOW
BUN: 7
Calcium: 8.4
Creatinine, Ser: 0.86
Glucose, Bld: 133 — ABNORMAL HIGH
Total Protein: 6.4

## 2010-12-31 LAB — BASIC METABOLIC PANEL
BUN: 13
BUN: 7
BUN: 8
BUN: 8
BUN: 9
CO2: 35 — ABNORMAL HIGH
CO2: 36 — ABNORMAL HIGH
Calcium: 8.4
Calcium: 8.5
Chloride: 89 — ABNORMAL LOW
Chloride: 91 — ABNORMAL LOW
Chloride: 94 — ABNORMAL LOW
Chloride: 96
Creatinine, Ser: 0.9
Creatinine, Ser: 1.12
GFR calc Af Amer: >60
GFR calc Af Amer: >60
GFR calc non Af Amer: >60
GFR calc non Af Amer: >60
GFR calc non Af Amer: >60
Glucose, Bld: 115 — ABNORMAL HIGH
Glucose, Bld: 95
Glucose, Bld: 96
Potassium: 2.9 — ABNORMAL LOW
Potassium: 3 — ABNORMAL LOW
Potassium: 3.4 — ABNORMAL LOW
Sodium: 135
Sodium: 136

## 2010-12-31 LAB — PROTIME-INR
INR: 1.6 — ABNORMAL HIGH
INR: 1.7 — ABNORMAL HIGH
INR: 1.7 — ABNORMAL HIGH
Prothrombin Time: 14.1
Prothrombin Time: 20.2 — ABNORMAL HIGH
Prothrombin Time: 20.6 — ABNORMAL HIGH

## 2010-12-31 LAB — CBC
HCT: 29.4 — ABNORMAL LOW
HCT: 32.4 — ABNORMAL LOW
HCT: 41.9
Hemoglobin: 13.7
Hemoglobin: 9.8 — ABNORMAL LOW
MCHC: 33.3
MCHC: 33.5
MCHC: 33.9
MCV: 81.4
MCV: 81.5
MCV: 82.9
Platelets: 241
Platelets: 259
Platelets: 280
Platelets: 293
RBC: 3.68 — ABNORMAL LOW
RDW: 15
RDW: 15.1
RDW: 15.1

## 2010-12-31 LAB — MAGNESIUM: Magnesium: 2.3

## 2011-01-11 ENCOUNTER — Telehealth: Payer: Self-pay | Admitting: *Deleted

## 2011-01-11 NOTE — Telephone Encounter (Signed)
[facility] Triage Call Report Triage Record Num: [id number] Operator: Caswell Corwin Patient Name: Jonathan [name] Call Date & Time: 01/09/2011 10:37:57AM Patient Phone: [phone] PCP: Patient Gender: Male PCP Fax : Patient DOB: Jan 19, 1956 Practice Name: [facility] Reason for Call: Mrs. Trouten, Spouse, calling regarding Other. PCP is [facility] number is [phone]. Wife calling that pt cannot hear out of L ear at all. Started 3 week ago and this AM cannot hear out of ear. It is ringing and tingling. It is itching down inside ear. Pt has been using some ear oil from 3 yrs ago. Traged Ear: Hearing Change and all emergent SX R/O. Needs to be seen in E/R or U/C in 24 hrs for eval. Home care and call back inst given. Protocol(s) Used: Ear: Hearing Change Recommended Outcome per Protocol: See Provider within 24 hours Reason for Outcome: New onset of severe vertigo or dizziness and ringing in the ears, lasting hours to days, and associated hearing loss Care Advice: ~ Protect the patient from falling or other harm. ~ Another adult should drive. ~ Call provider if symptoms worsen or new symptoms develop. ~ CAUTIONS ~ List, or take, all current prescription(s), nonprescription or alternative medication(s) to provider for evaluation. 01/09/2011 10:48:53AM Page 1 of 1 [email or web address]

## 2011-04-20 ENCOUNTER — Encounter (HOSPITAL_COMMUNITY): Payer: Self-pay | Admitting: Emergency Medicine

## 2011-04-20 ENCOUNTER — Emergency Department (HOSPITAL_COMMUNITY): Payer: BC Managed Care – PPO

## 2011-04-20 ENCOUNTER — Telehealth: Payer: Self-pay | Admitting: Family Medicine

## 2011-04-20 ENCOUNTER — Emergency Department (HOSPITAL_COMMUNITY)
Admission: EM | Admit: 2011-04-20 | Discharge: 2011-04-20 | Disposition: A | Discharge status: Home / Self Care | Payer: BC Managed Care – PPO | Attending: Emergency Medicine | Admitting: Emergency Medicine

## 2011-04-20 DIAGNOSIS — R110 Nausea: Secondary | ICD-10-CM | POA: Insufficient documentation

## 2011-04-20 DIAGNOSIS — R42 Dizziness and giddiness: Secondary | ICD-10-CM | POA: Insufficient documentation

## 2011-04-20 DIAGNOSIS — I10 Essential (primary) hypertension: Secondary | ICD-10-CM | POA: Insufficient documentation

## 2011-04-20 DIAGNOSIS — R61 Generalized hyperhidrosis: Secondary | ICD-10-CM | POA: Insufficient documentation

## 2011-04-20 DIAGNOSIS — G43909 Migraine, unspecified, not intractable, without status migrainosus: Secondary | ICD-10-CM | POA: Insufficient documentation

## 2011-04-20 DIAGNOSIS — H53149 Visual discomfort, unspecified: Secondary | ICD-10-CM | POA: Insufficient documentation

## 2011-04-20 LAB — BASIC METABOLIC PANEL
Chloride: 101 mEq/L (ref 96–112)
Creatinine, Ser: 0.79 mg/dL (ref 0.50–1.35)
GFR calc Af Amer: >90 mL/min (ref >90)
Potassium: 3.8 mEq/L (ref 3.5–5.1)
Sodium: 138 mEq/L (ref 135–145)

## 2011-04-20 LAB — CBC
HCT: 36.9 % — ABNORMAL LOW (ref 39.0–52.0)
Platelets: 257 10*3/uL (ref 150–400)
RBC: 4.75 MIL/uL (ref 4.22–5.81)
RDW: 15.9 % — ABNORMAL HIGH (ref 11.5–15.5)
WBC: 8.9 10*3/uL (ref 4.0–10.5)

## 2011-04-20 LAB — POCT I-STAT TROPONIN I: Troponin i, poc: 0.01 ng/mL (ref 0.00–0.08)

## 2011-04-20 MED ORDER — ONDANSETRON HCL 4 MG/2ML IJ SOLN
4.0000 mg | Freq: Once | INTRAMUSCULAR | Status: AC
  Administered 2011-04-20: 4 mg via INTRAVENOUS
  Filled 2011-04-20: qty 2

## 2011-04-20 MED ORDER — DEXAMETHASONE SODIUM PHOSPHATE 10 MG/ML IJ SOLN
10.0000 mg | Freq: Once | INTRAMUSCULAR | Status: AC
  Administered 2011-04-20: 10 mg via INTRAVENOUS
  Filled 2011-04-20: qty 1

## 2011-04-20 MED ORDER — DIPHENHYDRAMINE HCL 50 MG/ML IJ SOLN
25.0000 mg | Freq: Once | INTRAMUSCULAR | Status: AC
  Administered 2011-04-20: 25 mg via INTRAVENOUS
  Filled 2011-04-20: qty 1

## 2011-04-20 MED ORDER — METOCLOPRAMIDE HCL 5 MG/ML IJ SOLN
10.0000 mg | Freq: Once | INTRAMUSCULAR | Status: AC
  Administered 2011-04-20: 10 mg via INTRAVENOUS
  Filled 2011-04-20: qty 2

## 2011-04-20 MED ORDER — MORPHINE SULFATE 4 MG/ML IJ SOLN
4.0000 mg | INTRAMUSCULAR | Status: AC
  Administered 2011-04-20: 4 mg via INTRAVENOUS
  Filled 2011-04-20: qty 1

## 2011-04-20 MED ORDER — SODIUM CHLORIDE 0.9 % IV SOLN
Freq: Once | INTRAVENOUS | Status: AC
  Administered 2011-04-20: 11:00:00 via INTRAVENOUS

## 2011-04-20 NOTE — Telephone Encounter (Signed)
Pt was seen today in the Er for High Blood Pressure and Migraines and was told to follow up with pcp this week. When can pt be seen?

## 2011-04-20 NOTE — ED Provider Notes (Signed)
History     CSN: [id number]  Arrival date & time 04/20/11  4098   First MD Initiated Contact with Patient 04/20/11 [phone]      Chief Complaint  Patient presents with  . Hypertension  . Headache    (Consider location/radiation/quality/duration/timing/severity/associated sxs/prior treatment) Patient is a 56 y.o. male presenting with headaches. The history is provided by the patient.  Headache  This is a new problem. The current episode started 3 to 5 hours ago. The problem occurs constantly. The problem has been gradually improving. The headache is associated with nothing. The pain is located in the temporal, frontal and occipital region. Quality: squeezing. The pain is moderate. The pain does not radiate. Pertinent negatives include no fever, no chest pressure, no near-syncope, no palpitations, no syncope, no shortness of breath, no nausea and no vomiting. Associated symptoms comments: Dizziness. He has tried NSAIDs (naproxen) for the symptoms. The treatment provided no relief.   Pt has a hx of HTN and states he is otherwise healthy. He awoke from sleep this morning with a headache, was very sweaty, and felt dizzy. The headache is described as "squeezing" and bandlike in nature. He felt dizzy upon standing and trying to walk. Denies worsening dizziness with head position or tinnitus. Denies phonophobia, vomiting, although he did have slight nausea and photophobia. States he has had similar headaches before when his BP has been elevated. Took his BP at home and it was elevated to 180/100, which is quite high for him. He took his AM losartan after this but his sx have not been relieved. Headache has not changed in nature since onset. Denies associated chest pain, dyspnea. Has a remote hx of migraines but has not had any since he was a teenager.  Per wife at bedside, he has been intermittently complaining of HA since Christmas.  Admits that he has had some right sided sharp, intermittent chest pain  for about the last 2 weeks. It does not radiate. No known aggravating/alleviating factors. It is not associated with dyspnea, diaphoresis, nausea, or vomiting.  Past Medical History  Diagnosis Date  . Hypertension   . Anxiety   . ED (erectile dysfunction)     Past Surgical History  Procedure Date  . Total hip arthroplasty     x2  . Foot surgery     Family History  Problem Relation Age of Onset  . Hypertension Mother   . Colon cancer Mother   . Prostate cancer Father   . Tuberculosis Sister   . Asthma Father     History  Substance Use Topics  . Smoking status: Never Smoker   . Smokeless tobacco: Never Used  . Alcohol Use: No      Review of Systems  Constitutional: Negative for fever.  Eyes: Negative for photophobia.  Respiratory: Negative for shortness of breath.   Cardiovascular: Negative for palpitations, syncope and near-syncope.  Gastrointestinal: Negative for nausea and vomiting.  Musculoskeletal: Negative for myalgias.  Neurological: Positive for dizziness and headaches. Negative for seizures, syncope, facial asymmetry and weakness.  Psychiatric/Behavioral: Negative for confusion.    Allergies  Codeine  Home Medications   Current Outpatient Rx  Name Route Sig Dispense Refill  . LOSARTAN POTASSIUM 100 MG PO TABS Oral Take 1 tablet (100 mg total) by mouth daily. 100 tablet 3  . ECHINACEA GOLDENSEAL PLUS PO Oral Take 1 capsule by mouth daily.      [name] PROSTATE PO Oral Take 1 capsule by mouth daily.  BP 147/86  Pulse 58  Temp(Src) 98.1 F (36.7 C) (Oral)  Resp 18  Ht 6' (1.829 m)  Wt 260 lb (117.935 kg)  BMI 35.26 kg/m2  SpO2 100%  Physical Exam  Nursing note and vitals reviewed. Constitutional: He is oriented to person, place, and time. He appears well-developed and well-nourished. No distress.  HENT:  Head: Normocephalic and atraumatic.  Right Ear: External ear normal.  Left Ear: External ear normal.  Mouth/Throat: Oropharynx is clear  and moist. No oropharyngeal exudate.  Eyes: Conjunctivae and EOM are normal. Pupils are equal, round, and reactive to light. Right eye exhibits no discharge. Left eye exhibits no discharge.  Neck: Normal range of motion. Neck supple.  Cardiovascular: Normal rate, regular rhythm and normal heart sounds.   Pulmonary/Chest: Effort normal and breath sounds normal. No respiratory distress. He has no wheezes. He exhibits no tenderness.  Abdominal: Soft. Bowel sounds are normal. There is no tenderness. There is no rebound and no guarding.  Musculoskeletal: He exhibits no edema.  Lymphadenopathy:    He has no cervical adenopathy.  Neurological: He is alert and oriented to person, place, and time. No cranial nerve deficit. Coordination normal.  Skin: Skin is warm and dry. No rash noted. He is not diaphoretic.  Psychiatric: He has a normal mood and affect.    ED Course  Procedures (including critical care time)   Date: 04/20/2011  Rate: 70  Rhythm: normal sinus rhythm  QRS Axis: left  Intervals: normal  ST/T Wave abnormalities: normal  Conduction Disutrbances:none  Narrative Interpretation: left ventricular hypertrophy  Old EKG Reviewed: as compared with March 2008 no significant changes  Labs Reviewed  CBC - Abnormal; Notable for the following:    Hemoglobin 12.1 (*)    HCT 36.9 (*)    MCV 77.7 (*)    MCH 25.5 (*)    RDW 15.9 (*)    All other components within normal limits  BASIC METABOLIC PANEL  POCT I-STAT TROPONIN I  POCT I-STAT TROPONIN I  I-STAT TROPONIN I  I-STAT TROPONIN I   Ct Head Wo Contrast  04/20/2011  *RADIOLOGY REPORT*  Clinical Data: Hypertension, headache, dizziness.  CT HEAD WITHOUT CONTRAST  Technique:  Contiguous axial images were obtained from the base of the skull through the vertex without contrast.  Comparison: None.  Findings: No acute intracranial abnormality.  Specifically, no hemorrhage, hydrocephalus, mass lesion, acute infarction, or significant  intracranial injury.  No acute calvarial abnormality. Visualized paranasal sinuses and mastoids clear.  Orbital soft tissues unremarkable.  IMPRESSION: Normal study.  Original Report Authenticated By: Cyndie Chime, M.D.     1. Migraine       MDM  Patient's headache was resolved with migraine cocktail of dex/Reglan/Benadryl. Imaging negative. Troponin negative x 2, no changes seen on ECG. BP not abnormally high here. Pt was instructed to make a f/u with PCP for a recheck in the next week.        Grant Fontana, [place] 04/20/11 1545

## 2011-04-20 NOTE — ED Notes (Signed)
Lab at bedside

## 2011-04-20 NOTE — ED Provider Notes (Signed)
Medical screening examination/treatment/procedure(s) were performed by non-physician practitioner and as supervising physician I was immediately available for consultation/collaboration.   Dayton Bailiff, MD 04/20/11 1556

## 2011-04-20 NOTE — ED Notes (Signed)
Pt laying on stretcher talking with visitor. Skin warm/ dry and color WNL.  Resps even/ unlabored.

## 2011-04-20 NOTE — ED Notes (Signed)
Pt awoke with dizziness, headache this morning.  Checked BP at home and was elevated.

## 2011-04-20 NOTE — ED Notes (Signed)
Family at bedside. 

## 2011-04-21 NOTE — Telephone Encounter (Signed)
lmom for pt to call back and schedule ER follow up next week

## 2011-04-21 NOTE — Telephone Encounter (Signed)
Okay to schdule

## 2011-04-26 ENCOUNTER — Ambulatory Visit (INDEPENDENT_AMBULATORY_CARE_PROVIDER_SITE_OTHER): Payer: BC Managed Care – PPO | Admitting: Family Medicine

## 2011-04-26 ENCOUNTER — Encounter: Payer: Self-pay | Admitting: Family Medicine

## 2011-04-26 DIAGNOSIS — I10 Essential (primary) hypertension: Secondary | ICD-10-CM

## 2011-04-26 MED ORDER — LOSARTAN POTASSIUM-HCTZ 100-25 MG PO TABS: 1.0000 | ORAL_TABLET | Freq: Every day | ORAL | Status: DC

## 2011-04-26 MED ORDER — AMLODIPINE BESYLATE 5 MG PO TABS: 5.0000 mg | ORAL_TABLET | Freq: Every day | ORAL | Status: DC

## 2011-04-26 NOTE — Progress Notes (Signed)
  Subjective:    Patient ID: UNDRAY ALLMAN, male    DOB: 02-08-1956, 56 y.o.   MRN: [id number]  HPI  Akili is a 56 year old male, nonsmoker, who called last week because his blood pressure was elevated.  He was advised to come here............. however, he went to the emergency room.  Workup in the emergency room was nondiagnostic.  He was advised to come see [name] for follow-up.  BP today right arm sitting position 170/90 confirmed with our cuff.    Review of Systems General and pulmonary review of systems otherwise negative.  Cardiac review of systems, negative.  He's been compliant with his medication.  He has taken an over-the-counter antihistamine, but no decongestant    Objective:   Physical Exam Well-developed well-nourished man in no acute distress.  BP right arm sitting position 170/90 confirmed       Assessment & Plan:  Hypertension not at goal and Norvasc, and diuretic continue 100 mg of Cozaar daily.  Return in two weeks for follow-up

## 2011-04-26 NOTE — Patient Instructions (Signed)
Taking Norvasc 5 mg one tablet now been starting tomorrow morning, one tablet every morning.  Hyzaar one tablet daily in the morning starting tomorrow.  BP checked daily in the morning.  Return in two weeks for follow-up

## 2011-05-08 ENCOUNTER — Ambulatory Visit (INDEPENDENT_AMBULATORY_CARE_PROVIDER_SITE_OTHER): Payer: BC Managed Care – PPO

## 2011-05-08 DIAGNOSIS — J40 Bronchitis, not specified as acute or chronic: Secondary | ICD-10-CM

## 2011-05-08 DIAGNOSIS — R059 Cough, unspecified: Secondary | ICD-10-CM

## 2011-05-08 DIAGNOSIS — R05 Cough: Secondary | ICD-10-CM

## 2011-05-10 ENCOUNTER — Ambulatory Visit: Payer: BC Managed Care – PPO | Admitting: Family Medicine

## 2011-05-21 ENCOUNTER — Other Ambulatory Visit: Payer: Self-pay | Admitting: Family Medicine

## 2011-09-08 ENCOUNTER — Ambulatory Visit (INDEPENDENT_AMBULATORY_CARE_PROVIDER_SITE_OTHER): Payer: BC Managed Care – PPO | Admitting: Family Medicine

## 2011-09-08 ENCOUNTER — Encounter: Payer: Self-pay | Admitting: Family Medicine

## 2011-09-08 VITALS — BP 160/100 | Temp 98.7°F | Wt 258.0 lb

## 2011-09-08 DIAGNOSIS — I10 Essential (primary) hypertension: Secondary | ICD-10-CM

## 2011-09-08 MED ORDER — ATENOLOL-CHLORTHALIDONE 50-25 MG PO TABS: 1.0000 | ORAL_TABLET | Freq: Every day | ORAL | Status: DC

## 2011-09-08 NOTE — Patient Instructions (Signed)
Begin D. Tenoretic................ one half tablet daily in the morning  Stop the Hyzaar  Check your blood pressure daily in the morning and return in 2 weeks for followup with the data and the device

## 2011-09-08 NOTE — Progress Notes (Signed)
  Subjective:    Patient ID: Jonathan [name], male    DOB: 1956-03-13, 56 y.o.   MRN: [id number]  HPI Jonathan [name] is a 56 year old married male nonsmoker who comes in today for evaluation of hypertension  He was on Norvasc 5 mg daily and it was not controlling his blood pressure. We switched him to Hyzaar 3 months ago and he's been taking it daily however since he's been taking it he says he's had recurrent hives. No airway edema  BP at home and here 160/100   Review of Systems    general and cardiovascular review of systems negative except she's lost 11 pounds with diet and exercise he's down to 258 Objective:   Physical Exam  Well-developed well-nourished male in no acute distress BP right arm sitting position 160/100      Assessment & Plan:  Hypertension not at goal hives from Hyzaar,,,,,,,,,,,,,

## 2011-12-09 ENCOUNTER — Telehealth: Payer: Self-pay | Admitting: Gastroenterology

## 2011-12-09 ENCOUNTER — Encounter: Payer: Self-pay | Admitting: *Deleted

## 2011-12-09 ENCOUNTER — Telehealth: Payer: Self-pay | Admitting: *Deleted

## 2011-12-09 DIAGNOSIS — R109 Unspecified abdominal pain: Secondary | ICD-10-CM

## 2011-12-09 NOTE — Telephone Encounter (Signed)
Patient is calling with abdominal pain.  It has been going on for about 2 months.  The pain is increasing and the patient would like to be seen.  Per Dr Tawanna Cooler - GI - ASAP.

## 2011-12-09 NOTE — Telephone Encounter (Signed)
Pt scheduled to see Dr. Arlyce Dice 12/15/11@2 :30pm. Pt aware of appt date and time.

## 2011-12-09 NOTE — Telephone Encounter (Signed)
Error

## 2011-12-15 ENCOUNTER — Encounter: Payer: Self-pay | Admitting: Gastroenterology

## 2011-12-15 ENCOUNTER — Ambulatory Visit: Payer: BC Managed Care – PPO | Admitting: Family Medicine

## 2011-12-15 ENCOUNTER — Other Ambulatory Visit (INDEPENDENT_AMBULATORY_CARE_PROVIDER_SITE_OTHER): Payer: BC Managed Care – PPO

## 2011-12-15 ENCOUNTER — Ambulatory Visit (INDEPENDENT_AMBULATORY_CARE_PROVIDER_SITE_OTHER): Payer: BC Managed Care – PPO | Admitting: Gastroenterology

## 2011-12-15 VITALS — BP 132/80 | HR 62 | Ht 70.5 in | Wt 261.0 lb

## 2011-12-15 DIAGNOSIS — D126 Benign neoplasm of colon, unspecified: Secondary | ICD-10-CM

## 2011-12-15 DIAGNOSIS — R52 Pain, unspecified: Secondary | ICD-10-CM

## 2011-12-15 DIAGNOSIS — Z800 Family history of malignant neoplasm of digestive organs: Secondary | ICD-10-CM

## 2011-12-15 DIAGNOSIS — D649 Anemia, unspecified: Secondary | ICD-10-CM

## 2011-12-15 DIAGNOSIS — R1084 Generalized abdominal pain: Secondary | ICD-10-CM

## 2011-12-15 DIAGNOSIS — R1013 Epigastric pain: Secondary | ICD-10-CM

## 2011-12-15 LAB — CBC WITH DIFFERENTIAL/PLATELET
Basophils Absolute: 0 10*3/uL (ref 0.0–0.1)
Lymphocytes Relative: 19.5 % (ref 12.0–46.0)
Monocytes Relative: 9.1 % (ref 3.0–12.0)
Neutrophils Relative %: 69.8 % (ref 43.0–77.0)
Platelets: 266 10*3/uL (ref 150.0–400.0)
RDW: 16.5 % — ABNORMAL HIGH (ref 11.5–14.6)

## 2011-12-15 LAB — FERRITIN: Ferritin: 9.2 ng/mL — ABNORMAL LOW (ref 22.0–322.0)

## 2011-12-15 LAB — IBC PANEL
Iron: 150 ug/dL (ref 42–165)
Saturation Ratios: 35.3 % (ref 20.0–50.0)

## 2011-12-15 NOTE — Assessment & Plan Note (Signed)
Patient complains of nonspecific upper abdominal pain for the past year and a half. This could be due to ulcer or nonulcer dyspepsia. There is no evidence for overt GI bleeding.  Recommendations #1 upper endoscopy

## 2011-12-15 NOTE — Patient Instructions (Addendum)
You will go to the basement for labs today It has been recommended by Dr Arlyce Dice that you have an Upper Endoscopy When you are ready to schedule contact the office at [phone]

## 2011-12-15 NOTE — Assessment & Plan Note (Signed)
Plan follow-up colonoscopy 2017 

## 2011-12-15 NOTE — Progress Notes (Signed)
History of Present Illness: Pleasant 56 year old African American male with history of colon polyps here for evaluation of abdominal pain. For the past 1.5 years he has noted intermittent early mild midepigastric crampy pain. Pain may last only minutes at a time. It is unrelated to eating or bowel movements. He denies nausea, pyrosis, melena or hematochezia. He is on no gastric irritants in nonsteroidals. Colonoscopy in 2012 demonstrated a small adenomatous polyp. Family history is pertinent for mother with colon cancer.  Lab review is pertinent for a microcytic anemia seen at blood work in January, 2013. Hemoglobin was 12.1, MCV 77.7 and RDW 15.9.      Past Medical History  Diagnosis Date  . Hypertension   . Anxiety   . ED (erectile dysfunction)    Past Surgical History  Procedure Date  . Total hip arthroplasty     x2  . Foot surgery    family history includes Asthma in his father; Colon cancer in his mother; Hypertension in his mother; Prostate cancer in his father; and Tuberculosis in his sister. Current Outpatient Prescriptions  Medication Sig Dispense Refill  . atenolol-chlorthalidone (TENORETIC 50) 50-25 MG per tablet Take 1 tablet by mouth daily.  100 tablet  3   Allergies as of 12/15/2011 - Review Complete 12/15/2011  Allergen Reaction Noted  . Codeine    . Cozaar (losartan potassium) Hives 09/08/2011    reports that he has never smoked. He has never used smokeless tobacco. He reports that he does not drink alcohol or use illicit drugs.     Review of Systems: Pertinent positive and negative review of systems were noted in the above HPI section. All other review of systems were otherwise negative.  Vital signs were reviewed in today's medical record Physical Exam: General: Well developed , well nourished, no acute distress Head: Normocephalic and atraumatic Eyes:  sclerae anicteric, EOMI Ears: Normal auditory acuity Mouth: No deformity or lesions Neck: Supple, no  masses or thyromegaly Lungs: Clear throughout to auscultation Heart: Regular rate and rhythm; no murmurs, rubs or bruits Abdomen: Soft, non tender and non distended. No masses, hepatosplenomegaly or hernias noted. Normal Bowel sounds Rectal:deferred Musculoskeletal: Symmetrical with no gross deformities  Skin: No lesions on visible extremities Pulses:  Normal pulses noted Extremities: No clubbing, cyanosis, edema or deformities noted Neurological: Alert oriented x 4, grossly nonfocal Cervical Nodes:  No significant cervical adenopathy Inguinal Nodes: No significant inguinal adenopathy Psychological:  Alert and cooperative. Normal mood and affect

## 2011-12-15 NOTE — Assessment & Plan Note (Signed)
Microcytic anemia could be do to an iron deficiency. Thalassemia minor is also a consideration.  Recommendations #1 check iron, TIBC, ferritin, B12 and folate levels #2 Hemoccults

## 2011-12-15 NOTE — Assessment & Plan Note (Signed)
Plan surveillance colonoscopy every 5 years 

## 2011-12-20 ENCOUNTER — Other Ambulatory Visit: Payer: BC Managed Care – PPO

## 2011-12-20 DIAGNOSIS — R1013 Epigastric pain: Secondary | ICD-10-CM

## 2011-12-21 ENCOUNTER — Encounter: Payer: Self-pay | Admitting: Gastroenterology

## 2012-01-11 ENCOUNTER — Ambulatory Visit (INDEPENDENT_AMBULATORY_CARE_PROVIDER_SITE_OTHER): Payer: BC Managed Care – PPO | Admitting: Family Medicine

## 2012-01-11 DIAGNOSIS — Z23 Encounter for immunization: Secondary | ICD-10-CM

## 2012-01-20 ENCOUNTER — Ambulatory Visit (AMBULATORY_SURGERY_CENTER): Payer: BC Managed Care – PPO | Admitting: *Deleted

## 2012-01-20 ENCOUNTER — Encounter: Payer: Self-pay | Admitting: Gastroenterology

## 2012-01-20 VITALS — Ht 72.0 in | Wt 260.0 lb

## 2012-01-20 DIAGNOSIS — R52 Pain, unspecified: Secondary | ICD-10-CM

## 2012-01-20 DIAGNOSIS — R1084 Generalized abdominal pain: Secondary | ICD-10-CM

## 2012-01-20 DIAGNOSIS — R1013 Epigastric pain: Secondary | ICD-10-CM

## 2012-02-03 ENCOUNTER — Encounter: Payer: Self-pay | Admitting: Gastroenterology

## 2012-02-03 ENCOUNTER — Other Ambulatory Visit: Payer: Self-pay | Admitting: Gastroenterology

## 2012-02-03 ENCOUNTER — Telehealth: Payer: Self-pay

## 2012-02-03 ENCOUNTER — Ambulatory Visit (AMBULATORY_SURGERY_CENTER): Payer: BC Managed Care – PPO | Admitting: Gastroenterology

## 2012-02-03 VITALS — BP 151/115 | HR 44 | Temp 96.4°F | Resp 20 | Ht 72.0 in | Wt 260.0 lb

## 2012-02-03 DIAGNOSIS — R109 Unspecified abdominal pain: Secondary | ICD-10-CM

## 2012-02-03 DIAGNOSIS — R1013 Epigastric pain: Secondary | ICD-10-CM

## 2012-02-03 MED ORDER — SODIUM CHLORIDE 0.9 % IV SOLN: 500.0000 mL | INTRAVENOUS | Status: DC

## 2012-02-03 NOTE — Progress Notes (Signed)
Patient did not have preoperative order for IV antibiotic SSI prophylaxis. (G8918)  Patient did not experience any of the following events: a burn prior to discharge; a fall within the facility; wrong site/side/patient/procedure/implant event; or a hospital transfer or hospital admission upon discharge from the facility. (G8907)  

## 2012-02-03 NOTE — Patient Instructions (Addendum)
YOU HAD AN ENDOSCOPIC PROCEDURE TODAY AT THE [facility]: Refer to the procedure report that was given to you for any specific questions about what was found during the examination.  If the procedure report does not answer your questions, please call your gastroenterologist to clarify.  If you requested that your care partner not be given the details of your procedure findings, then the procedure report has been included in a sealed envelope for you to review at your convenience later.   DIET: Your first meal following the procedure should be a light meal and then it is ok to progress to your normal diet.  A half-sandwich or bowl of soup is an example of a good first meal.  Heavy or fried foods are harder to digest and may make you feel nauseous or bloated.  Likewise meals heavy in dairy and vegetables can cause extra gas to form and this can also increase the bloating.  Drink plenty of fluids but you should avoid alcoholic beverages for 24 hours.  ACTIVITY: Your care partner should take you home directly after the procedure.  You should plan to take it easy, moving slowly for the rest of the day.  You can resume normal activity the day after the procedure however you should NOT DRIVE or use heavy machinery for 24 hours (because of the sedation medicines used during the test).    SYMPTOMS TO REPORT IMMEDIATELY: A gastroenterologist can be reached at any hour.  During normal business hours, 8:30 AM to 5:00 PM Monday through Friday, call [phone].  After hours and on weekends, please call the GI answering service at [phone] who will take a message and have the physician on call contact you.   Following upper endoscopy (EGD)  Vomiting of blood or coffee ground material  New chest pain or pain under the shoulder blades  Painful or persistently difficult swallowing  New shortness of breath  Fever of 100F or higher  Black, tarry-looking stools  FOLLOW UP: If any biopsies were  taken you will be contacted by phone or by letter within the next 1-3 weeks.  Call your gastroenterologist if you have not heard about the biopsies in 3 weeks.  Our staff will call the home number listed on your records the next business day following your procedure to check on you and address any questions or concerns that you may have at that time regarding the information given to you following your procedure. This is a courtesy call and so if there is no answer at the home number and we have not heard from you through the emergency physician on call, we will assume that you have returned to your regular daily activities without incident.  SIGNATURES/CONFIDENTIALITY: You and/or your care partner have signed paperwork which will be entered into your electronic medical record.  These signatures attest to the fact that that the information above on your After Visit Summary has been reviewed and is understood.  Full responsibility of the confidentiality of this discharge information lies with you and/or your care-partner.    Your upper GI will be scheduled by Dr. Marzetta Board nurse Aram Beecham down on the 3rd floor.  She should be calling you soon.  Thank-you for choosing [name] for your medical care.

## 2012-02-03 NOTE — Op Note (Addendum)
[facility] [address] [place], [address]   ENDOSCOPY PROCEDURE REPORT  PATIENT: Jonathan [name], Jonathan [name]  MR#: [id number] BIRTHDATE: [birth year] , 56  yrs. old GENDER: Male ENDOSCOPIST: Louis Meckel, MD REFERRED BY: PROCEDURE DATE:  02/03/2012 PROCEDURE:  EGD, diagnostic ASA CLASS:     Class II INDICATIONS:  periumbilical abdominal pain. MEDICATIONS: MAC sedation, administered by CRNA and propofol (Diprivan) 150mg  IV TOPICAL ANESTHETIC: Cetacaine Spray  DESCRIPTION OF PROCEDURE: After the risks benefits and alternatives of the procedure were thoroughly explained, informed consent was obtained.  The LB-GIF Q180 [id number] endoscope was introduced through the mouth and advanced to the third portion of the duodenum. Without limitations.  The instrument was slowly withdrawn as the mucosa was fully examined.    There are no mucosal abnormalities on exam.  The stomach seemed to be horizontally located low no frank was appreciated.   There are no mucosal abnormalities on exam.  The stomach seemed to be horizontally located low no frank was appreciated.   The remainder of the upper endoscopy exam was otherwise normal.  Retroflexed views revealed no abnormalities.     The scope was then withdrawn from the patient and the procedure completed.  COMPLICATIONS: There were no complications. ENDOSCOPIC IMPRESSION: 1.   There are no mucosal abnormalities on exam.  The stomach seemed to be horizontally although  no frank hernia was appreciated. 2.   The remainder of the upper endoscopy exam was otherwise normal  RECOMMENDATIONS: Upper GI Series to be scheduled  REPEAT EXAM:  eSigned:  Louis Meckel, MD 02/08/2012 2:38 PM Revised: 02/08/2012 2:38 PM  XB:[id number] Shawnie Dapper, MD and Lonell Grandchild, MD

## 2012-02-03 NOTE — Telephone Encounter (Signed)
Pt scheduled for upper gi series at [facility] pt to arrive at 10:15am for a 10:30am appt. Pt to be NPO after midnight. Pt aware of appt date and time.

## 2012-02-04 ENCOUNTER — Telehealth: Payer: Self-pay | Admitting: *Deleted

## 2012-02-04 NOTE — Telephone Encounter (Signed)
  Follow up Call-  Call back number 02/03/2012  Post procedure Call Back phone  # [phone]  Permission to leave phone message Yes     Patient questions:  Do you have a fever, pain , or abdominal swelling? no Pain Score  0 *  Have you tolerated food without any problems? yes  Have you been able to return to your normal activities? yes  Do you have any questions about your discharge instructions: Diet   no Medications  no Follow up visit  no  Do you have questions or concerns about your Care? no  Actions: * If pain score is 4 or above: No action needed, pain <4.

## 2012-02-07 ENCOUNTER — Ambulatory Visit (HOSPITAL_COMMUNITY)
Admission: RE | Admit: 2012-02-07 | Discharge: 2012-02-07 | Disposition: A | Discharge status: Home / Self Care | Payer: BC Managed Care – PPO | Source: Ambulatory Visit | Attending: Gastroenterology | Admitting: Gastroenterology

## 2012-02-07 DIAGNOSIS — R109 Unspecified abdominal pain: Secondary | ICD-10-CM

## 2012-02-08 ENCOUNTER — Telehealth: Payer: Self-pay

## 2012-02-08 ENCOUNTER — Ambulatory Visit (HOSPITAL_COMMUNITY)
Admission: RE | Admit: 2012-02-08 | Discharge: 2012-02-08 | Disposition: A | Discharge status: Home / Self Care | Payer: BC Managed Care – PPO | Source: Ambulatory Visit | Attending: Gastroenterology | Admitting: Gastroenterology

## 2012-02-08 DIAGNOSIS — R109 Unspecified abdominal pain: Secondary | ICD-10-CM | POA: Insufficient documentation

## 2012-02-08 DIAGNOSIS — K219 Gastro-esophageal reflux disease without esophagitis: Secondary | ICD-10-CM | POA: Insufficient documentation

## 2012-02-08 NOTE — Telephone Encounter (Signed)
Let's obtain an abdominal ultrasound and then have him followup in the office

## 2012-02-08 NOTE — Telephone Encounter (Signed)
Pt had upper GI and it was normal. States he is still having problems. Wonders if he needs a follow-up OV or a different xray. Please advise.

## 2012-02-10 NOTE — Telephone Encounter (Signed)
Spoke with pt and he states that he will have to wait until the 1st of the year when he has more vacation time to have anything else done. Pt states he will call us after the 1st of the year.

## 2012-02-14 ENCOUNTER — Telehealth: Payer: Self-pay | Admitting: Gastroenterology

## 2012-02-14 ENCOUNTER — Other Ambulatory Visit: Payer: Self-pay | Admitting: Gastroenterology

## 2012-02-14 DIAGNOSIS — R109 Unspecified abdominal pain: Secondary | ICD-10-CM

## 2012-02-14 NOTE — Telephone Encounter (Signed)
Pt called back and states that he is off 02/22/12 and could do the abdominal ultrasound then. Pt scheduled for abdominal ultrasound @[facility]  02/22/12 arrival time 7:45am for an 8am appt. Pt to be NPO after midnight. Pt needed the appt later in the day. Pt given the phone number [phone] to reschedule the appt to a later time.

## 2012-02-22 ENCOUNTER — Ambulatory Visit (HOSPITAL_COMMUNITY)
Admission: RE | Admit: 2012-02-22 | Discharge: 2012-02-22 | Disposition: A | Discharge status: Home / Self Care | Payer: BC Managed Care – PPO | Source: Ambulatory Visit | Attending: Gastroenterology | Admitting: Gastroenterology

## 2012-02-22 ENCOUNTER — Ambulatory Visit (HOSPITAL_COMMUNITY): Payer: BC Managed Care – PPO

## 2012-02-22 DIAGNOSIS — R109 Unspecified abdominal pain: Secondary | ICD-10-CM | POA: Insufficient documentation

## 2012-02-25 ENCOUNTER — Telehealth: Payer: Self-pay | Admitting: Gastroenterology

## 2012-02-25 NOTE — Telephone Encounter (Signed)
Pt called for ultrasound results. Please advise.

## 2012-02-28 NOTE — Telephone Encounter (Signed)
Negative ultrasound. If he is still having pain he needs an office visit

## 2012-02-28 NOTE — Telephone Encounter (Signed)
Spoke with pt and he is aware. He requested that Dr. Arlyce Dice call him at the number listed below. States he cannot get time off from work very easily for a visit. Would like to talk to Dr. Arlyce Dice.

## 2012-03-02 NOTE — Telephone Encounter (Signed)
Ultrasound was normal. He needs a followup office visit.

## 2012-03-03 ENCOUNTER — Telehealth: Payer: Self-pay | Admitting: Gastroenterology

## 2012-03-03 NOTE — Telephone Encounter (Signed)
Dr. Arlyce Dice did you call this pt as requested?

## 2012-03-03 NOTE — Telephone Encounter (Signed)
Called and left message for him to call back

## 2012-03-03 NOTE — Telephone Encounter (Signed)
Left message for patient to call back  

## 2012-03-06 ENCOUNTER — Telehealth: Payer: Self-pay | Admitting: *Deleted

## 2012-03-06 NOTE — Telephone Encounter (Signed)
Message copied by Marlowe Kays on Mon Mar 06, 2012  3:00 PM ------      Message from: Melvia Heaps D      Created: Mon Feb 28, 2012  3:20 PM       These office visit

## 2012-03-06 NOTE — Telephone Encounter (Signed)
Per Dr Arlyce Dice patient needs a follow up appointment

## 2012-03-23 NOTE — Telephone Encounter (Signed)
Pt scheduled on 04/20/2012 at 9:15am Mailed pt a appointment letter today

## 2012-04-20 ENCOUNTER — Ambulatory Visit: Payer: BC Managed Care – PPO | Admitting: Gastroenterology

## 2012-06-13 HISTORY — PX: PROSTATE BIOPSY: SHX241

## 2012-07-26 ENCOUNTER — Ambulatory Visit
Admission: RE | Admit: 2012-07-26 | Discharge: 2012-07-26 | Disposition: A | Discharge status: Home / Self Care | Payer: BC Managed Care – PPO | Source: Ambulatory Visit | Attending: Radiation Oncology | Admitting: Radiation Oncology

## 2012-07-26 ENCOUNTER — Encounter: Payer: Self-pay | Admitting: Oncology

## 2012-07-26 ENCOUNTER — Encounter: Payer: Self-pay | Admitting: *Deleted

## 2012-07-26 VITALS — BP 105/64 | HR 48 | Temp 97.7°F | Wt 269.3 lb

## 2012-07-26 DIAGNOSIS — C61 Malignant neoplasm of prostate: Secondary | ICD-10-CM | POA: Insufficient documentation

## 2012-07-26 DIAGNOSIS — Z96649 Presence of unspecified artificial hip joint: Secondary | ICD-10-CM | POA: Insufficient documentation

## 2012-07-26 DIAGNOSIS — N529 Male erectile dysfunction, unspecified: Secondary | ICD-10-CM | POA: Insufficient documentation

## 2012-07-26 DIAGNOSIS — Z800 Family history of malignant neoplasm of digestive organs: Secondary | ICD-10-CM | POA: Insufficient documentation

## 2012-07-26 DIAGNOSIS — I10 Essential (primary) hypertension: Secondary | ICD-10-CM | POA: Insufficient documentation

## 2012-07-26 HISTORY — DX: Gastro-esophageal reflux disease without esophagitis: K21.9

## 2012-07-26 HISTORY — DX: Elevated prostate specific antigen (PSA): R97.20

## 2012-07-26 HISTORY — DX: Unspecified osteoarthritis, unspecified site: M19.90

## 2012-07-26 NOTE — Progress Notes (Signed)
Please see the Nurse Progress Note in the MD Initial Consult Encounter for this patient. 

## 2012-07-26 NOTE — Progress Notes (Signed)
[facility] Radiation Oncology NEW PATIENT EVALUATION  Name: Jonathan [name] MRN: [id number]  Date:   07/26/2012           DOB: 10/12/1955  Status: outpatient   CC: TODD,JEFFREY Freida Busman, MD  Garnett Farm, MD    REFERRING PHYSICIAN: Garnett Farm, MD   DIAGNOSIS: Stage TI C. favorable risk adenocarcinoma prostate   HISTORY OF PRESENT ILLNESS:  Jonathan [name] is a 57 y.o. male who is seen today for the courtesy of Dr. Vernie Ammons for discussion of possible radiation therapy in the management of his T1 C. favorable risk adenocarcinoma prostate. He has a history of prostatitis with fluctuating PSAs usually falling back down with antibiotics. He had 2 previous prostate biopsies which were benign. His PSA on 04/12/2011 was 3.98. A recent PSA this past December 27 was elevated at 9.33 and he underwent ultrasound-guided biopsies on 06/13/2012. His then have Gleason 6 (3+3) involving 10% of one core from the right lateral mid gland. Remaining biopsies were benign. His gland volume was 41 cc. He does have moderate obstructive symptomatology with an I PSS score of 17. No GI difficulties. He does have some degree of erectile dysfunction which is not improved with Cialis. He has been hypogonadal and had replacement testosterone therapy in early 2013 for approximately 1 month with no improvement in his libido or erectile function. He found it to expensive to continue. No GI difficulties. His father apparently underwent a prostatectomy at age 2 for prostate cancer.  PREVIOUS RADIATION THERAPY: No   PAST MEDICAL HISTORY:  has a past medical history of Hypertension; Anxiety; ED (erectile dysfunction); BPH (benign prostatic hyperplasia); Arthritis; Esophageal reflux; and Elevated PSA.     PAST SURGICAL HISTORY:  Past Surgical History  Procedure Laterality Date  . 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 Foot surgery      bilateral foot  . Prostate biopsy  06/13/2012    right  lat mid gleason 3+3=6     FAMILY HISTORY: family history includes Asthma in his father; Colon cancer in his mother; Hypertension in his mother; Lung cancer in his maternal grandmother; Prostate cancer (age of onset: 21) in his father; and Tuberculosis in his sister. father had a prostatectomy for cancer at age 30. He also has a history of cardiac disease. His mother died of colon cancer 71. He has one brother who is healthy.   SOCIAL HISTORY:  reports that he has never smoked. He has never used smokeless tobacco. He reports that he does not drink alcohol or use illicit drugs. Married, 2 children. He has worked 27 years for  [employer].   ALLERGIES: Codeine and Cozaar   MEDICATIONS:  Current Outpatient Prescriptions  Medication Sig Dispense Refill  . atenolol-chlorthalidone (TENORETIC 50) 50-25 MG per tablet Take 1 tablet by mouth daily.  100 tablet  3  . Multiple Vitamin (MULTIVITAMIN) tablet Take 1 tablet by mouth daily. alive      . Tamsulosin HCl (FLOMAX) 0.4 MG CAPS Take 1 capsule by mouth Once daily as needed. BPH       No current facility-administered medications for this encounter.     REVIEW OF SYSTEMS:  Pertinent items are noted in HPI.    PHYSICAL EXAM:  weight is 269 lb 4.8 oz (122.154 kg). His temperature is 97.7 F (36.5 C). His blood pressure is 105/64 and his pulse is 48. His oxygen saturation is 97%.   Alert and  oriented 57 year old African American male appearing younger than stated age. Head and neck examination: Grossly unremarkable. Nodes: Without palpable cervical or supraclavicular lymphadenopathy. Chest: Lungs clear. Heart: Regular rate rhythm. Back: Without spinal or CVA tenderness. Abdomen: Without masses organomegaly. Genitalia: Unremarkable to inspection. Rectal: The prostate gland is normal in size and is without focal induration or nodularity. Extremities: Bilateral hip replacement scars. There is no peripheral edema. Neurologic examination: Grossly  nonfocal.   LABORATORY DATA:  Lab Results  Component Value Date   WBC 8.8 12/15/2011   HGB 13.5 12/15/2011   HCT 41.0 12/15/2011   MCV 81.1 12/15/2011   PLT 266.0 12/15/2011   Lab Results  Component Value Date   NA 138 04/20/2011   K 3.8 04/20/2011   CL 101 04/20/2011   CO2 30 04/20/2011   Lab Results  Component Value Date   ALT 15 04/17/2010   AST 17 04/17/2010   ALKPHOS 82 04/17/2010   BILITOT 0.4 04/17/2010   PSA 9.33 from 04/07/2012   IMPRESSION: Stage TI C. favorable risk adenocarcinoma prostate. I explained to Jonathan [name] and his wife that his prognosis is related to his stage, PSA level, and Gleason score. All are favorable. Other prognostic features include disease volume and also PSA doubling time. His disease volume is quite low which is also favorable. One really cannot calculated PSA doubling time in this setting with a history of prostatitis and fluctuating PSAs. We discussed surgery versus close surveillance versus radiation therapy. Radiation therapy options include seed implantation or external beam/IMRT. At this time he is not an ideal candidate for either seed implantation or external beam/IMRT. He does have moderate obstruction symptomatology with an I PSS score of 17 which can only worsen following a prostate seed implant. If he underwent a TUIP with improvement of his obstructive symptomatology then he could conceivably be a candidate for seed implantation. Based on his bilateral hip prostheses it would be difficult to have an ideal plan with IMRT since a major portion the dose does come from the lateral portion of the pelvis. We discussed the potential acute and late toxicities of radiation therapy. Simply based on his age, I do recommend robotic prostatectomy as the treatment of choice. He can be expected to have better urination following surgery compared to radiation therapy but accepting a very small risk of urinary incontinence with surgery. Close surveillance may be an option in the  short-term provided that he has serial PSA determinations and perhaps repeat biopsies every one to 2 years depending on his PSA velocity. I am somewhat concerned about his current PSA which is almost 10. He'll think things over and get back in touch with Dr. Vernie Ammons.   PLAN: As discussed above.   I spent 60 minutes minutes face to face with the patient and more than 50% of that time was spent in counseling and/or coordination of care.

## 2012-07-26 NOTE — Progress Notes (Signed)
GU Location of Tumor / Histology: right lat mid  If Prostate Cancer, Gleason Score is (3 + 3) and PSA is (9.33)  Patient presented  months ago with signs/symptoms of elevated psa of 9.33  Biopsies of prostate (if applicable) revealed: prostatic adenocarcinoma  Past/Anticipated interventions by urology, if any: prostate biopsy 06/13/2012  Past/Anticipated interventions by medical oncology, if any: none  Weight changes, if any:     Bowel/Bladder complaints, if any:     Nausea/Vomiting, if any:    Pain issues, if any:     SAFETY ISSUES:  Prior radiation? no  Pacemaker/ICD? no  Possible current pregnancy? no  Is the patient on methotrexate? no  Current Complaints / other details:

## 2012-07-28 NOTE — Addendum Note (Signed)
Encounter addended by: Delynn Flavin, RN on: 07/28/2012  7:16 PM<BR>     Documentation filed: Charges VN

## 2012-10-16 ENCOUNTER — Encounter: Payer: Self-pay | Admitting: Family Medicine

## 2012-10-16 ENCOUNTER — Ambulatory Visit (INDEPENDENT_AMBULATORY_CARE_PROVIDER_SITE_OTHER): Payer: BC Managed Care – PPO | Admitting: Family Medicine

## 2012-10-16 VITALS — BP 110/78 | HR 40 | Temp 98.0°F | Resp 16 | Ht 71.0 in | Wt 244.0 lb

## 2012-10-16 VITALS — BP 110/80 | HR 40 | Temp 98.4°F | Wt 247.0 lb

## 2012-10-16 DIAGNOSIS — R001 Bradycardia, unspecified: Secondary | ICD-10-CM

## 2012-10-16 DIAGNOSIS — I10 Essential (primary) hypertension: Secondary | ICD-10-CM

## 2012-10-16 DIAGNOSIS — H6690 Otitis media, unspecified, unspecified ear: Secondary | ICD-10-CM

## 2012-10-16 DIAGNOSIS — H6692 Otitis media, unspecified, left ear: Secondary | ICD-10-CM

## 2012-10-16 DIAGNOSIS — I498 Other specified cardiac arrhythmias: Secondary | ICD-10-CM

## 2012-10-16 DIAGNOSIS — H9202 Otalgia, left ear: Secondary | ICD-10-CM

## 2012-10-16 MED ORDER — AMOXICILLIN 875 MG PO TABS: 875.0000 mg | ORAL_TABLET | Freq: Two times a day (BID) | ORAL | Status: DC

## 2012-10-16 MED ORDER — ATENOLOL 25 MG PO TABS: ORAL_TABLET | ORAL | Status: DC

## 2012-10-16 NOTE — Patient Instructions (Signed)
Stop the Tenoretic  Start Tenormin 25 mg....... One half tab daily in the morning on Thursday  BP check daily in the morning  Followup in 4 weeks

## 2012-10-16 NOTE — Progress Notes (Signed)
  Subjective:    Patient ID: Jonathan [name], male    DOB: [birth year], 56 y.o.   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 He went to an urgent care center for an ear ache and was given amoxicillin. They noticed a low heart rate. He comes in today on Tenormin 25 mg daily BP 110/80 pulse right at 50  We have tried other medications in the past and he had side effect from everything except the beta blockers,,,,,,,,,he got hives from Hyzaar   Review of Systems    review of systems negative Objective:   Physical Exam  Well-developed well-nourished male no acute distress vital signs stable except for heart rate 50 8P 110/80      Assessment & Plan:

## 2012-10-16 NOTE — Progress Notes (Signed)
[facility]:  Office Visit  Chief Complaint:  Chief Complaint  Patient presents with  . Otalgia    left ear x 3 days    HPI: Jonathan [name] is a 57 y.o. male who complains of left ear x 1 week. Pain and draining and muffled. He has tried hydrogen peroxide without releif. He denies fevers, chills. Has not been in water or swimming. He showers but no swimming.  Denies CP, SOB, dizziness or lightheadedness. He is on Tenoretic but he has been on it for over 10 years but he has only recently lost weight.  He has been working out and has been losing weight, he has 30 lbs in 6 months and has been consistent He doe not take his flomax consistenty, did not take it last night, he only uses it when he has decrease urinary flow He does 40 min of cardio regularly without any sxs He is a [employer] driver  Past Medical History  Diagnosis Date  . Hypertension   . Anxiety   . ED (erectile dysfunction)   . BPH (benign prostatic hyperplasia)   . Arthritis   . Esophageal reflux   . Elevated PSA     9.33   Past Surgical History  Procedure Laterality Date  . 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 Foot surgery      bilateral foot  . Prostate biopsy  06/13/2012    right lat mid gleason 3+3=6   History   Social History  . Marital Status: Married    Spouse Name: N/A    Number of Children: 2  . 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 Smoking status: Never Smoker   . Smokeless tobacco: Never Used  . Alcohol Use: No  . Drug Use: No  . Sexually Active: None   Other Topics Concern  . None   Social History Narrative  . None   Family History  Problem Relation Age of Onset  . Hypertension Mother   . Colon cancer Mother   . Prostate cancer Father 72    alive now age 32  . Asthma Father   . Tuberculosis Sister   . Lung cancer Maternal Grandmother     non-smoker   Allergies  Allergen Reactions  .  Codeine     REACTION: emesis  . Cozaar (Losartan Potassium) Hives   Prior to Admission medications   Medication Sig Start Date End Date Taking? Authorizing Provider  atenolol-chlorthalidone (TENORETIC) 50-25 MG per tablet Take 1 tablet by mouth daily.   Yes Historical Provider, MD  Multiple Vitamin (MULTIVITAMIN) tablet Take 1 tablet by mouth daily. alive   Yes Historical Provider, MD  Tamsulosin HCl (FLOMAX) 0.4 MG CAPS Take 1 capsule by mouth Once daily as needed. BPH 01/03/12  Yes Historical Provider, MD  atenolol-chlorthalidone (TENORETIC 50) 50-25 MG per tablet Take 1 tablet by mouth daily. 09/08/11 09/07/12  Roderick Pee, MD     ROS: The patient denies fevers, chills, night sweats, unintentional weight loss, chest pain, palpitations, wheezing, dyspnea on exertion, nausea, vomiting, abdominal pain, dysuria, hematuria, melena, numbness, weakness, or tingling.   All other systems have been reviewed and were otherwise negative with the exception of those mentioned in the HPI and as above.    PHYSICAL EXAM: Filed Vitals:   10/16/12 0854  BP: 110/78  Pulse: 40  Temp: 98  F (36.7 C)  Resp: 16   Filed Vitals:   10/16/12 0854  Height: 5\' 11"  (1.803 m)  Weight: 244 lb (110.678 kg)   Body mass index is 34.05 kg/(m^2).  General: Alert, no acute distress HEENT:  Normocephalic, atraumatic, oropharynx patent. Left TM boggy Cardiovascular:  Regular rate and rhythm, no rubs murmurs or gallops.  No Carotid bruits, radial pulse intact. No pedal edema.  Respiratory: Clear to auscultation bilaterally.  No wheezes, rales, or rhonchi.  No cyanosis, no use of accessory musculature GI: No organomegaly, abdomen is soft and non-tender, positive bowel sounds.  No masses. Skin: No rashes. Neurologic: Facial musculature symmetric. Psychiatric: Patient is appropriate throughout our interaction. Lymphatic: No cervical lymphadenopathy Musculoskeletal: Gait intact.   LABS: Results for orders placed  in visit on 12/20/11  FECAL OCCULT BLOOD, IMMUNOCHEMICAL      Result Value Range   Fecal Occult Bld Negative  Negative     EKG/XRAY:   Primary read interpreted by Dr. Conley Rolls at [facility].   ASSESSMENT/PLAN: Encounter Diagnoses  Name Primary?  . Otitis media, left Yes  . Bradycardia    He does not want me to adjust his HTN meds, pulse is low and BP low, he is asymptomatic. He decline EKG. He would prefer to see Dr. Tawanna Cooler for any BP med adjusment. I think that is fine since he is asymptomatic. He will do it today.  Chart review shows that he has had bradycardia since 01/2012, we do not have any current EKG, last one was in 04/2011 based on EMR review. D/w patient need for recheck of HTN med and bradycardia with PCP sonner than later Rx Amoxacillin 875 mg BID x 10 days for left OM Gross sideeffects, risk and benefits, and alternatives of medications d/w patient. Patient is aware that all medications have potential sideeffects and we are unable to predict every sideeffect or drug-drug interaction that may occur. F/u prn     [name], DO 10/16/2012 9:36 AM

## 2012-11-07 ENCOUNTER — Ambulatory Visit: Payer: BC Managed Care – PPO | Admitting: Family Medicine

## 2013-04-19 ENCOUNTER — Other Ambulatory Visit (HOSPITAL_COMMUNITY): Payer: Self-pay | Admitting: Orthopaedic Surgery

## 2013-04-19 DIAGNOSIS — M25551 Pain in right hip: Secondary | ICD-10-CM

## 2013-04-19 DIAGNOSIS — M25552 Pain in left hip: Secondary | ICD-10-CM

## 2013-05-02 ENCOUNTER — Ambulatory Visit (HOSPITAL_COMMUNITY)
Admission: RE | Admit: 2013-05-02 | Discharge: 2013-05-02 | Disposition: A | Discharge status: Home / Self Care | Payer: BC Managed Care – PPO | Source: Ambulatory Visit | Attending: Orthopaedic Surgery | Admitting: Orthopaedic Surgery

## 2013-05-02 ENCOUNTER — Ambulatory Visit (HOSPITAL_COMMUNITY): Admission: RE | Admit: 2013-05-02 | Payer: BC Managed Care – PPO | Source: Ambulatory Visit

## 2013-05-02 ENCOUNTER — Other Ambulatory Visit (HOSPITAL_COMMUNITY): Payer: Self-pay | Admitting: Orthopaedic Surgery

## 2013-05-02 DIAGNOSIS — M25551 Pain in right hip: Secondary | ICD-10-CM

## 2013-05-02 DIAGNOSIS — Z96649 Presence of unspecified artificial hip joint: Secondary | ICD-10-CM | POA: Insufficient documentation

## 2013-05-02 DIAGNOSIS — M25552 Pain in left hip: Principal | ICD-10-CM

## 2013-05-02 DIAGNOSIS — M25559 Pain in unspecified hip: Secondary | ICD-10-CM | POA: Insufficient documentation

## 2013-05-02 DIAGNOSIS — F40298 Other specified phobia: Secondary | ICD-10-CM | POA: Insufficient documentation

## 2013-05-22 ENCOUNTER — Other Ambulatory Visit: Payer: BC Managed Care – PPO

## 2013-05-23 ENCOUNTER — Other Ambulatory Visit (INDEPENDENT_AMBULATORY_CARE_PROVIDER_SITE_OTHER): Payer: BC Managed Care – PPO

## 2013-05-23 DIAGNOSIS — Z0000 Encounter for general adult medical examination without abnormal findings: Secondary | ICD-10-CM

## 2013-05-23 LAB — CBC WITH DIFFERENTIAL/PLATELET
BASOS PCT: 0.3 % (ref 0.0–3.0)
Basophils Absolute: 0 10*3/uL (ref 0.0–0.1)
EOS PCT: 1.6 % (ref 0.0–5.0)
Eosinophils Absolute: 0.1 10*3/uL (ref 0.0–0.7)
HEMATOCRIT: 44.2 % (ref 39.0–52.0)
Hemoglobin: 13.9 g/dL (ref 13.0–17.0)
Lymphocytes Relative: 21.4 % (ref 12.0–46.0)
Lymphs Abs: 1.7 10*3/uL (ref 0.7–4.0)
MCHC: 31.4 g/dL (ref 30.0–36.0)
MCV: 83.6 fl (ref 78.0–100.0)
MONO ABS: 0.6 10*3/uL (ref 0.1–1.0)
MONOS PCT: 8.1 % (ref 3.0–12.0)
NEUTROS PCT: 68.6 % (ref 43.0–77.0)
Neutro Abs: 5.3 10*3/uL (ref 1.4–7.7)
Platelets: 269 10*3/uL (ref 150.0–400.0)
RBC: 5.29 Mil/uL (ref 4.22–5.81)
RDW: 17 % — ABNORMAL HIGH (ref 11.5–14.6)
WBC: 7.7 10*3/uL (ref 4.5–10.5)

## 2013-05-23 LAB — HEPATIC FUNCTION PANEL
ALBUMIN: 3.5 g/dL (ref 3.5–5.2)
ALK PHOS: 61 U/L (ref 39–117)
ALT: 14 U/L (ref 0–53)
AST: 16 U/L (ref 0–37)
Bilirubin, Direct: 0.1 mg/dL (ref 0.0–0.3)
TOTAL PROTEIN: 7.4 g/dL (ref 6.0–8.3)
Total Bilirubin: 0.5 mg/dL (ref 0.3–1.2)

## 2013-05-23 LAB — LIPID PANEL
CHOL/HDL RATIO: 3
Cholesterol: 152 mg/dL (ref 0–200)
HDL: 46.2 mg/dL (ref >39.00)
LDL CALC: 91 mg/dL (ref 0–99)
Triglycerides: 73 mg/dL (ref 0.0–149.0)
VLDL: 14.6 mg/dL (ref 0.0–40.0)

## 2013-05-23 LAB — POCT URINALYSIS DIPSTICK
Bilirubin, UA: NEGATIVE
Glucose, UA: NEGATIVE
KETONES UA: NEGATIVE
Leukocytes, UA: NEGATIVE
Nitrite, UA: NEGATIVE
PH UA: 5.5
RBC UA: NEGATIVE
SPEC GRAV UA: 1.02
UROBILINOGEN UA: 0.2

## 2013-05-23 LAB — BASIC METABOLIC PANEL
BUN: 12 mg/dL (ref 6–23)
CO2: 30 meq/L (ref 19–32)
Calcium: 9.4 mg/dL (ref 8.4–10.5)
Chloride: 103 mEq/L (ref 96–112)
Creatinine, Ser: 0.8 mg/dL (ref 0.4–1.5)
GFR: 130.23 mL/min (ref >60.00)
GLUCOSE: 94 mg/dL (ref 70–99)
POTASSIUM: 4.8 meq/L (ref 3.5–5.1)
Sodium: 141 mEq/L (ref 135–145)

## 2013-05-23 LAB — PSA: PSA: 5.23 ng/mL — ABNORMAL HIGH (ref 0.10–4.00)

## 2013-05-23 LAB — TSH: TSH: 1.35 u[IU]/mL (ref 0.35–5.50)

## 2013-05-24 ENCOUNTER — Ambulatory Visit (INDEPENDENT_AMBULATORY_CARE_PROVIDER_SITE_OTHER): Payer: BC Managed Care – PPO | Admitting: Family Medicine

## 2013-05-24 ENCOUNTER — Encounter: Payer: Self-pay | Admitting: Family Medicine

## 2013-05-24 VITALS — BP 140/90 | Temp 98.7°F | Ht 70.0 in | Wt 254.0 lb

## 2013-05-24 DIAGNOSIS — C61 Malignant neoplasm of prostate: Secondary | ICD-10-CM

## 2013-05-24 DIAGNOSIS — I10 Essential (primary) hypertension: Secondary | ICD-10-CM

## 2013-05-24 DIAGNOSIS — J309 Allergic rhinitis, unspecified: Secondary | ICD-10-CM

## 2013-05-24 MED ORDER — TAMSULOSIN HCL 0.4 MG PO CAPS: 0.4000 mg | ORAL_CAPSULE | Freq: Every day | ORAL | Status: DC

## 2013-05-24 MED ORDER — ATENOLOL 25 MG PO TABS: ORAL_TABLET | ORAL | Status: DC

## 2013-05-24 MED ORDER — FLUTICASONE PROPIONATE 50 MCG/ACT NA SUSP: 2.0000 | Freq: Every day | NASAL | Status: DC

## 2013-05-24 NOTE — Progress Notes (Signed)
   Subjective:    Patient ID: Jonathan [name], male    DOB: [birth year], 57 y.o.   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 He's going back for 6 months followup to decide what to do  He gets routine eye care, dental care, colonoscopy 2013 normal  He has allergic rhinitis and the Septra was allergies for the last 3 weeks sneezing runny nose head congestion postnasal drip.  Review of Systems  Constitutional: Negative.   HENT: Negative.   Eyes: Negative.   Respiratory: Negative.   Cardiovascular: Negative.   Gastrointestinal: Negative.   Endocrine: Negative.   Genitourinary: Negative.   Musculoskeletal: Negative.   Skin: Negative.   Allergic/Immunologic: Negative.   Neurological: Negative.   Hematological: Negative.   Psychiatric/Behavioral: Negative.        Objective:   Physical Exam  Nursing note and vitals reviewed. Constitutional: He is oriented to person, place, and time. He appears well-developed and well-nourished.  HENT:  Head: Normocephalic and atraumatic.  Right Ear: External ear normal.  Left Ear: External ear normal.  Nose: Nose normal.  Mouth/Throat: Oropharynx is clear and moist.  Eyes: Conjunctivae and EOM are normal. Pupils are equal, round, and reactive to light.  Neck: Normal range of motion. Neck supple. No JVD present. No tracheal deviation present. No thyromegaly present.  Cardiovascular: Normal rate, regular rhythm, normal heart sounds and intact distal pulses.  Exam reveals no gallop and no friction rub.   No murmur heard. Pulmonary/Chest: Effort normal and breath sounds normal. No stridor. No respiratory distress. He has no wheezes. He has no rales. He exhibits no tenderness.    Abdominal: Soft. Bowel sounds are normal. He exhibits no distension and no mass. There is no tenderness. There is no rebound and no guarding.  Genitourinary:  Genital rectal exam done by urologist every 6 months  Musculoskeletal: Normal range of motion. He exhibits no edema and no tenderness.  Lymphadenopathy:    He has no cervical adenopathy.  Neurological: He is alert and oriented to person, place, and time. He has normal reflexes. No cranial nerve deficit. He exhibits normal muscle tone.  Skin: Skin is warm and dry. No rash noted. No erythema. No pallor.  Psychiatric: He has a normal mood and affect. His behavior is normal. Judgment and thought content normal.   total body skin exam normal except for fungal infection of all his toenails        Assessment & Plan:  Healthy male  History of hypertension continue Tenormin 12.5 mg daily  BPH continue Flomax 0.4 daily  History prostate cancer followup in urology  Allergic rhinitis steroid nasal spray and Zyrtec plain each bedtime

## 2013-05-24 NOTE — Progress Notes (Signed)
Pre visit review using our clinic review tool, if applicable. No additional management support is needed unless otherwise documented below in the visit note. 

## 2013-05-24 NOTE — Patient Instructions (Signed)
Plain Zyrtec 10 mg.......[name] 1 at bedtime  Steroid nasal spray.......[name] 1 shot up each nostril at bedtime  Continue the Tenormin.....[name] one half tab daily for good blood pressure control  Continue the Flomax  Return in one year for general physical exam sooner if any problems

## 2013-05-25 ENCOUNTER — Telehealth: Payer: Self-pay | Admitting: Family Medicine

## 2013-05-25 NOTE — Telephone Encounter (Signed)
Relevant patient education mailed to patient.  

## 2013-07-11 ENCOUNTER — Ambulatory Visit (INDEPENDENT_AMBULATORY_CARE_PROVIDER_SITE_OTHER): Payer: BC Managed Care – PPO | Admitting: Family

## 2013-07-11 ENCOUNTER — Encounter: Payer: Self-pay | Admitting: Family

## 2013-07-11 VITALS — BP 140/90 | HR 87 | Temp 97.7°F | Wt 260.0 lb

## 2013-07-11 DIAGNOSIS — B9689 Other specified bacterial agents as the cause of diseases classified elsewhere: Secondary | ICD-10-CM

## 2013-07-11 DIAGNOSIS — J309 Allergic rhinitis, unspecified: Secondary | ICD-10-CM

## 2013-07-11 DIAGNOSIS — J019 Acute sinusitis, unspecified: Secondary | ICD-10-CM

## 2013-07-11 MED ORDER — METHYLPREDNISOLONE 4 MG PO KIT: PACK | ORAL | Status: AC

## 2013-07-11 MED ORDER — DOXYCYCLINE HYCLATE 100 MG PO TABS: 100.0000 mg | ORAL_TABLET | Freq: Two times a day (BID) | ORAL | Status: DC

## 2013-07-11 NOTE — Progress Notes (Signed)
Pre visit review using our clinic review tool, if applicable. No additional management support is needed unless otherwise documented below in the visit note. 

## 2013-07-11 NOTE — Progress Notes (Signed)
Subjective:    Patient ID: Jonathan [name], male    DOB: [birth year], 58 y.o.   MRN: [id number]  HPI 58 year old African American male, in today with complaints of sneezing, sinus pressure, constant headache, nosebleeds, congestion x6 weeks and worsening. He was seen by Dr. Sherren Mocha and was prescribed Flonase and told to take Zyrtec for which she's been going on for last 6 weeks without much relief.   Review of Systems  Constitutional: Negative.  Negative for fever and chills.  HENT: Positive for congestion, postnasal drip, sinus pressure and sore throat.   Respiratory: Positive for cough. Negative for shortness of breath and wheezing.   Cardiovascular: Negative.   Musculoskeletal: Negative.   Skin: Negative.   Allergic/Immunologic: Positive for environmental allergies. Negative for food allergies.  Neurological: Negative.   Psychiatric/Behavioral: Negative.    Past Medical History  Diagnosis Date  . Hypertension   . Anxiety   . ED (erectile dysfunction)   . BPH (benign prostatic hyperplasia)   . Arthritis   . Esophageal reflux   . Elevated PSA     9.33    History   Social History  . Marital Status: Married    Spouse Name: N/A    Number of Children: 2  . 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 Smoking status: Never Smoker   . Smokeless tobacco: Never Used  . Alcohol Use: No  . Drug Use: No  . Sexual Activity: Not on file   Other Topics Concern  . Not on file   Social History Narrative  . No narrative on file    Past Surgical History  Procedure Laterality Date  . 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 Foot surgery      bilateral foot  . Prostate biopsy  06/13/2012    right lat mid gleason 3+3=6    Family History  Problem Relation Age of Onset  . Hypertension Mother   . Colon cancer Mother   . Prostate cancer Father 2    alive now age 10  . Asthma Father   . Tuberculosis Sister     . Lung cancer Maternal Grandmother     non-smoker    Allergies  Allergen Reactions  . Codeine     REACTION: emesis  . Cozaar [Losartan Potassium] Hives    Current Outpatient Prescriptions on File Prior to Visit  Medication Sig Dispense Refill  . atenolol (TENORMIN) 25 MG tablet One half tab every morning  90 tablet  3  . fluticasone (FLONASE) 50 MCG/ACT nasal spray Place 2 sprays into both nostrils daily.  32 g  6  . Multiple Vitamin (MULTIVITAMIN) tablet Take 1 tablet by mouth daily. alive      . tamsulosin (FLOMAX) 0.4 MG CAPS capsule Take 1 capsule (0.4 mg total) by mouth daily. BPH  100 capsule  3  . atenolol-chlorthalidone (TENORETIC 50) 50-25 MG per tablet Take 1 tablet by mouth daily.  100 tablet  3   No current facility-administered medications on file prior to visit.    BP 140/90  Pulse 87  Temp(Src) 97.7 F (36.5 C) (Oral)  Wt 260 lb (117.935 kg)chart    and Objective:   Physical Exam  Constitutional: He is oriented to person, place, and time. He appears well-developed and well-nourished.  HENT:  Right Ear: External ear normal.  Left Ear: External ear normal.  Nose:  Nose normal.  Mouth/Throat: Oropharynx is clear and moist.  Sinus tenderness to palpation of the ethmoid sinuses bilaterally  Neck: Normal range of motion. Neck supple.  Cardiovascular: Normal rate, regular rhythm and normal heart sounds.   Pulmonary/Chest: Effort normal and breath sounds normal.  Musculoskeletal: Normal range of motion.  Neurological: He is alert and oriented to person, place, and time.  Skin: Skin is warm and dry.  Psychiatric: He has a normal mood and affect.          Assessment & Plan:  Jonathan [name] was seen today for no specified reason.  Diagnoses and associated orders for this visit:  Acute bacterial sinusitis  Allergic rhinitis  Other Orders - methylPREDNISolone (MEDROL DOSEPAK) 4 MG tablet; follow package directions - doxycycline (VIBRA-TABS) 100 MG tablet; Take  1 tablet (100 mg total) by mouth 2 (two) times daily.   Call the office if symptoms worsen or persist. Recheck as needed and as scheduled

## 2013-07-11 NOTE — Patient Instructions (Signed)

## 2013-09-17 ENCOUNTER — Ambulatory Visit: Payer: BC Managed Care – PPO | Admitting: Internal Medicine

## 2013-09-17 ENCOUNTER — Encounter: Payer: Self-pay | Admitting: Physician Assistant

## 2013-09-17 ENCOUNTER — Ambulatory Visit (INDEPENDENT_AMBULATORY_CARE_PROVIDER_SITE_OTHER): Payer: BC Managed Care – PPO | Admitting: Physician Assistant

## 2013-09-17 VITALS — BP 120/80 | HR 51 | Temp 99.1°F | Resp 18 | Wt 263.0 lb

## 2013-09-17 DIAGNOSIS — J029 Acute pharyngitis, unspecified: Secondary | ICD-10-CM

## 2013-09-17 DIAGNOSIS — J0110 Acute frontal sinusitis, unspecified: Secondary | ICD-10-CM

## 2013-09-17 LAB — POCT RAPID STREP A (OFFICE): RAPID STREP A SCREEN: NEGATIVE

## 2013-09-17 MED ORDER — DOXYCYCLINE HYCLATE 100 MG PO TABS: 100.0000 mg | ORAL_TABLET | Freq: Two times a day (BID) | ORAL | Status: DC

## 2013-09-17 MED ORDER — BENZONATATE 100 MG PO CAPS: 100.0000 mg | ORAL_CAPSULE | Freq: Two times a day (BID) | ORAL | Status: DC | PRN

## 2013-09-17 NOTE — Progress Notes (Signed)
Pre visit review using our clinic review tool, if applicable. No additional management support is needed unless otherwise documented below in the visit note. 

## 2013-09-17 NOTE — Patient Instructions (Signed)
Doxycycline twice a day for 10 days for sinus infection.  Tessalon Perles as directed for cough.  Increase fluid hydration.  Followup as needed, or symptoms worsen or persist despite treatment.    Sinusitis Sinusitis is redness, soreness, and puffiness (inflammation) of the air pockets in the bones of your face (sinuses). The redness, soreness, and puffiness can cause air and mucus to get trapped in your sinuses. This can allow germs to grow and cause an infection.  HOME CARE   Drink enough fluids to keep your pee (urine) clear or pale yellow.  Use a humidifier in your home.  Run a hot shower to create steam in the bathroom. Sit in the bathroom with the door closed. Breathe in the steam 3 4 times a day.  Put a warm, moist washcloth on your face 3 4 times a day, or as told by your doctor.  Use salt water sprays (saline sprays) to wet the thick fluid in your nose. This can help the sinuses drain.  Only take medicine as told by your doctor. GET HELP RIGHT AWAY IF:   Your pain gets worse.  You have very bad headaches.  You are sick to your stomach (nauseous).  You throw up (vomit).  You are very sleepy (drowsy) all the time.  Your face is puffy (swollen).  Your vision changes.  You have a stiff neck.  You have trouble breathing. MAKE SURE YOU:   Understand these instructions.  Will watch your condition.  Will get help right away if you are not doing well or get worse. Document Released: 09/15/2007 Document Revised: 12/22/2011 Document Reviewed: 11/02/2011 [facility] Patient Information 2014 [place].

## 2013-09-17 NOTE — Progress Notes (Signed)
Subjective:    Patient ID: Jonathan [name], male    DOB: [birth year], 58 y.o.   MRN: [id number]  Cough This is a new problem. The current episode started in the past 7 days. The problem has been gradually worsening. The problem occurs every few minutes. The cough is productive of sputum (clear sputum). Associated symptoms include chills, ear congestion, ear pain, a fever, headaches, nasal congestion, postnasal drip and a sore throat. Pertinent negatives include no chest pain, heartburn, hemoptysis, myalgias, rash, rhinorrhea, shortness of breath, sweats, weight loss or wheezing. The symptoms are aggravated by lying down. Treatments tried: mucinex and coricidin hbp. The treatment provided no relief. His past medical history is significant for environmental allergies. There is no history of asthma or COPD.      Review of Systems  Constitutional: Positive for fever and chills. Negative for weight loss.  HENT: Positive for ear pain, postnasal drip and sore throat. Negative for rhinorrhea.   Respiratory: Positive for cough. Negative for hemoptysis, shortness of breath and wheezing.   Cardiovascular: Negative for chest pain.  Gastrointestinal: Negative for heartburn, nausea, vomiting and diarrhea.  Musculoskeletal: Negative for myalgias.  Skin: Negative for rash.  Allergic/Immunologic: Positive for environmental allergies.  Neurological: Positive for headaches.  All other systems reviewed and are negative.      Objective:   Physical Exam  Nursing note and vitals reviewed. Constitutional: He is oriented to person, place, and time. He appears well-developed and well-nourished. No distress.  HENT:  Head: Normocephalic and atraumatic.  Right Ear: External ear normal.  Left Ear: External ear normal.  Nose: Nose normal.  Mouth/Throat: No oropharyngeal exudate.  Oropharynx erythematous, no exudate.  Bilateral TMs normal.  Bilateral frontal sinuses tender to palpation. Bilateral maxillary  sinuses nontender to palpation.  Eyes: Conjunctivae and EOM are normal. Pupils are equal, round, and reactive to light.  Neck: Normal range of motion. Neck supple. No JVD present.  Cardiovascular: Normal rate, regular rhythm, normal heart sounds and intact distal pulses.  Exam reveals no gallop and no friction rub.   No murmur heard. Pulmonary/Chest: Effort normal and breath sounds normal. No stridor. No respiratory distress. He has no wheezes. He has no rales. He exhibits no tenderness.  Musculoskeletal: Normal range of motion.  Lymphadenopathy:    He has no cervical adenopathy.  Neurological: He is alert and oriented to person, place, and time.  Skin: Skin is warm and dry. No rash noted. He is not diaphoretic. No erythema. No pallor.  Psychiatric: He has a normal mood and affect. His behavior is normal. Judgment and thought content normal.    Filed Vitals:   09/17/13 1326  BP: 120/80  Pulse: 51  Temp: 99.1 F (37.3 C)  Resp: 18    Lab Results  Component Value Date   WBC 7.7 05/23/2013   HGB 13.9 05/23/2013   HCT 44.2 05/23/2013   PLT 269.0 05/23/2013   GLUCOSE 94 05/23/2013   CHOL 152 05/23/2013   TRIG 73.0 05/23/2013   HDL 46.20 05/23/2013   LDLCALC 91 05/23/2013   ALT 14 05/23/2013   AST 16 05/23/2013   NA 141 05/23/2013   K 4.8 05/23/2013   CL 103 05/23/2013   CREATININE 0.8 05/23/2013   BUN 12 05/23/2013   CO2 30 05/23/2013   TSH 1.35 05/23/2013   PSA 5.23* 05/23/2013   INR 1.7* 04/22/2007        Assessment & Plan:  Jonathan [name] was seen today for cough.  Diagnoses and  associated orders for this visit:  Acute frontal sinusitis Comments: apporximately 1 week of symptoms, and worsening. Will treat with doxycycline. - benzonatate (TESSALON) 100 MG capsule; Take 1 capsule (100 mg total) by mouth 2 (two) times daily as needed for cough. - doxycycline (VIBRA-TABS) 100 MG tablet; Take 1 tablet (100 mg total) by mouth 2 (two) times daily.  Sore throat - POC Rapid Strep A-    Negative.    Plan is to follow up as needed or for worsening or persistent symptoms despite treatment.  Patient Instructions  Doxycycline twice a day for 10 days for sinus infection.  Tessalon Perles as directed for cough.  Increase fluid hydration.  Followup as needed, or symptoms worsen or persist despite treatment.

## 2013-09-18 ENCOUNTER — Ambulatory Visit: Payer: BC Managed Care – PPO | Admitting: Internal Medicine

## 2013-12-07 ENCOUNTER — Encounter: Payer: Self-pay | Admitting: Gastroenterology

## 2013-12-09 ENCOUNTER — Other Ambulatory Visit: Payer: Self-pay | Admitting: Family Medicine

## 2014-01-22 ENCOUNTER — Encounter: Payer: Self-pay | Admitting: Family Medicine

## 2014-01-22 ENCOUNTER — Ambulatory Visit (INDEPENDENT_AMBULATORY_CARE_PROVIDER_SITE_OTHER): Payer: BC Managed Care – PPO | Admitting: Family Medicine

## 2014-01-22 DIAGNOSIS — M545 Low back pain: Secondary | ICD-10-CM

## 2014-01-22 DIAGNOSIS — M549 Dorsalgia, unspecified: Secondary | ICD-10-CM

## 2014-01-22 DIAGNOSIS — Z23 Encounter for immunization: Secondary | ICD-10-CM

## 2014-01-22 LAB — POCT URINALYSIS DIPSTICK
Bilirubin, UA: NEGATIVE
Blood, UA: NEGATIVE
Glucose, UA: NEGATIVE
Ketones, UA: NEGATIVE
LEUKOCYTES UA: NEGATIVE
Nitrite, UA: NEGATIVE
PH UA: 6
PROTEIN UA: 100
Spec Grav, UA: <=1.005
UROBILINOGEN UA: 0.2

## 2014-01-22 NOTE — Patient Instructions (Signed)
We will get you set up to see one of our neurologists for further evaluation of your left-sided lower back pain  Return when necessary

## 2014-01-22 NOTE — Progress Notes (Signed)
Pre visit review using our clinic review tool, if applicable. No additional management support is needed unless otherwise documented below in the visit note. 

## 2014-01-22 NOTE — Progress Notes (Signed)
   Subjective:    Patient ID: Jonathan [name], male    DOB: 07-09-1955, 58 y.o.   MRN: [id number]  HPI Jonathan [name] is a 58 year old male married nonsmoker......... 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 He regimen to bring for orthopedics. He did a history physical examination and x-rays and did not feel he had anything significant however offer him an MRI if his symptoms got worse. He didn't feel like he would go that route therefore he went to see a chiropractor. He's been seeing a chiropractor for the past 5 months but his back pain persists.  He would like to see a specialist for further evaluation of his back  He describes it as left lumbar back pain without radiation.  He's concerned there might be something wrong with his urinary tract. Urinalysis was normal except for trace protein.   Review of Systems Review of systems otherwise negative    Objective:   Physical Exam  Well-developed and nourished and in no acute distress vital signs stable is afebrile BP right arm sitting position 120/84      Assessment & Plan:  Back pain x6 months............Jonathan [name] referred to neurology for further evaluation

## 2014-04-09 ENCOUNTER — Encounter: Payer: Self-pay | Admitting: Nurse Practitioner

## 2014-04-09 ENCOUNTER — Ambulatory Visit (INDEPENDENT_AMBULATORY_CARE_PROVIDER_SITE_OTHER): Payer: BC Managed Care – PPO | Admitting: Nurse Practitioner

## 2014-04-09 VITALS — BP 172/90 | HR 43 | Temp 98.0°F | Ht 71.0 in | Wt 276.0 lb

## 2014-04-09 DIAGNOSIS — R030 Elevated blood-pressure reading, without diagnosis of hypertension: Secondary | ICD-10-CM

## 2014-04-09 DIAGNOSIS — IMO0001 Reserved for inherently not codable concepts without codable children: Secondary | ICD-10-CM

## 2014-04-09 DIAGNOSIS — R519 Headache, unspecified: Secondary | ICD-10-CM

## 2014-04-09 DIAGNOSIS — R51 Headache: Secondary | ICD-10-CM

## 2014-04-09 DIAGNOSIS — R635 Abnormal weight gain: Secondary | ICD-10-CM

## 2014-04-09 LAB — HEMOGLOBIN A1C: HEMOGLOBIN A1C: 6.1 % (ref 4.6–6.5)

## 2014-04-09 LAB — COMPREHENSIVE METABOLIC PANEL
ALT: 17 U/L (ref 0–53)
AST: 19 U/L (ref 0–37)
Albumin: 3.5 g/dL (ref 3.5–5.2)
Alkaline Phosphatase: 83 U/L (ref 39–117)
BUN: 12 mg/dL (ref 6–23)
CALCIUM: 9.3 mg/dL (ref 8.4–10.5)
CHLORIDE: 101 meq/L (ref 96–112)
CO2: 34 meq/L — AB (ref 19–32)
CREATININE: 0.9 mg/dL (ref 0.4–1.5)
GFR: 117.71 mL/min (ref >60.00)
Glucose, Bld: 91 mg/dL (ref 70–99)
POTASSIUM: 4.4 meq/L (ref 3.5–5.1)
SODIUM: 140 meq/L (ref 135–145)
Total Bilirubin: 0.5 mg/dL (ref 0.2–1.2)
Total Protein: 7.9 g/dL (ref 6.0–8.3)

## 2014-04-09 LAB — TSH: TSH: 1.17 u[IU]/mL (ref 0.35–4.50)

## 2014-04-09 MED ORDER — HYDROCHLOROTHIAZIDE 12.5 MG PO TABS: 12.5000 mg | ORAL_TABLET | Freq: Every day | ORAL | Status: DC

## 2014-04-09 NOTE — Progress Notes (Signed)
Pre visit review using our clinic review tool, if applicable. No additional management support is needed unless otherwise documented below in the visit note. 

## 2014-04-09 NOTE — Patient Instructions (Signed)
Please start new medicine for blood pressure. Take in the morning or at the beginning of your day. Take atenolol at night.   My office will call with lab results.  Develop lifelong habits of exercise most days of the week: take a 30 minute walk. The benefits include weight loss, lower risk for heart disease, diabetes, stroke, high blood pressure, lower rates of depression & dementia, better sleep quality & bone health.

## 2014-04-10 ENCOUNTER — Telehealth: Payer: Self-pay | Admitting: Nurse Practitioner

## 2014-04-10 DIAGNOSIS — R635 Abnormal weight gain: Secondary | ICD-10-CM | POA: Insufficient documentation

## 2014-04-10 DIAGNOSIS — R030 Elevated blood-pressure reading, without diagnosis of hypertension: Principal | ICD-10-CM

## 2014-04-10 DIAGNOSIS — R51 Headache: Secondary | ICD-10-CM

## 2014-04-10 DIAGNOSIS — IMO0001 Reserved for inherently not codable concepts without codable children: Secondary | ICD-10-CM | POA: Insufficient documentation

## 2014-04-10 DIAGNOSIS — R519 Headache, unspecified: Secondary | ICD-10-CM | POA: Insufficient documentation

## 2014-04-10 NOTE — Telephone Encounter (Signed)
pls call pt: Advise Thyroid normal He does not have diabetes, but is at risk for developing diabetes. If he will cut out refined sugar (anything that is sweet when you eat or drink it except fresh fruit) he will reduce risk. Also cut out white bread, rolls, biscuits, bagels, muffins, pasta and cereals.  Breads, cereals, pasta & rice that have 4 gm or more of fiber per serving are good.

## 2014-04-10 NOTE — Progress Notes (Signed)
Subjective:     Jonathan [name] is a 58 y.o. male presents w/c/o HA for 2 weeks, elevated blood pressure, & dry mouth in  Morning when wakes. He wants to be checked for diabetes. He has had 13 lb. Weight gain in 6 mos. He attributes this to not exercising as much. He has been taking 200 mg ibuprophen for HA with no relief. He checked blood pressure at drug store: 172/90. Current med: 25 mg tenormin qd. He is tolerating w/out SE. He was recently diagnosed w/prostate cancer & has prostatectomy scheduled next week. I discussed benefits of weight loss & exercise on BP, but will add HCTZ w/hopes that BP will come down & procedure next week will not be postponed.  The following portions of the patient's history were reviewed and updated as appropriate: allergies, current medications, past medical history, past social history, past surgical history and problem list.  Review of Systems Constitutional: positive for fatigue, negative for fevers Eyes: negative for visual disturbance Ears, nose, mouth, throat, and face: negative for nasal congestion, sore throat and voice change Respiratory: negative for cough Cardiovascular: negative for near-syncope and palpitations Genitourinary:positive for frequency Neurological: negative for dizziness and weakness Endocrine: negative for diabetic symptoms including polydipsia, polyphagia and polyuria and temperature intolerance    Objective:    BP 172/90 mmHg  Pulse 43  Temp(Src) 98 F (36.7 C) (Temporal)  Ht 5\' 11"  (1.803 m)  Wt 276 lb (125.193 kg)  BMI 38.51 kg/m2  SpO2 98% BP 172/90 mmHg  Pulse 43  Temp(Src) 98 F (36.7 C) (Temporal)  Ht 5\' 11"  (1.803 m)  Wt 276 lb (125.193 kg)  BMI 38.51 kg/m2  SpO2 98% General appearance: alert, cooperative, appears stated age and no distress Head: Normocephalic, without obvious abnormality, atraumatic Eyes: negative findings: lids and lashes normal and conjunctivae and sclerae normal Lungs: clear to  auscultation bilaterally Heart: regular rate and rhythm, S1, S2 normal, no murmur, click, rub or gallop Extremities: extremities normal, atraumatic, no cyanosis or edema Pulses: 2+ and symmetric    Assessment:Plan     1. Elevated blood pressure Add med - Comprehensive metabolic panel - Hemoglobin A1c - TSH - hydrochlorothiazide (HYDRODIURIL) 12.5 MG tablet; Take 1 tablet (12.5 mg total) by mouth daily.  Dispense: 30 tablet; Refill: 1  2. Weight gain, New - Comprehensive metabolic panel - Hemoglobin A1c - TSH  3. Acute nonintractable headache, unspecified headache type Likely due to elevated bp  Pt to f/u w/PCP

## 2014-04-11 NOTE — Telephone Encounter (Signed)
Patient notified of results.

## 2014-04-12 DIAGNOSIS — C61 Malignant neoplasm of prostate: Secondary | ICD-10-CM

## 2014-04-12 HISTORY — PX: PROSTATECTOMY: SHX69

## 2014-04-12 HISTORY — DX: Malignant neoplasm of prostate: C61

## 2014-04-16 DIAGNOSIS — C61 Malignant neoplasm of prostate: Secondary | ICD-10-CM | POA: Insufficient documentation

## 2014-05-20 ENCOUNTER — Ambulatory Visit (INDEPENDENT_AMBULATORY_CARE_PROVIDER_SITE_OTHER): Payer: BLUE CROSS/BLUE SHIELD | Admitting: Family Medicine

## 2014-05-20 ENCOUNTER — Encounter: Payer: Self-pay | Admitting: Family Medicine

## 2014-05-20 VITALS — BP 174/110 | Temp 97.9°F | Wt 277.0 lb

## 2014-05-20 DIAGNOSIS — I10 Essential (primary) hypertension: Secondary | ICD-10-CM

## 2014-05-20 MED ORDER — AMLODIPINE BESYLATE 5 MG PO TABS: 5.0000 mg | ORAL_TABLET | Freq: Every day | ORAL | Status: DC

## 2014-05-20 MED ORDER — ATENOLOL-CHLORTHALIDONE 50-25 MG PO TABS: 1.0000 | ORAL_TABLET | Freq: Every day | ORAL | Status: DC

## 2014-05-20 NOTE — Progress Notes (Signed)
   Subjective:    Patient ID: JACSON RAPAPORT, male    DOB: 11/16/1955, 59 y.o.   MRN: [id number]  HPI Diyari is a 59 year old married male nonsmoker who comes in today for evaluation of hypertension. His blood pressures been controlled with 12.5 mg of Tenormin and Hydrocort thiazide 12.5 mg however it went up and he's been monitoring at home. BP today is 174/110. He's upped his beta blocker to 50 mg a day blood pressure still not at goal  In the the past she's tried Cozaar and it didn't help    Review of Systems    review of systems otherwise negative except for weight gain from inactivity. He had his prostate removed 6 weeks ago in [place] prostate cancer Objective:   Physical Exam  Well-developed well-nourished male no acute distress vital signs stable she's afebrile BP right arm sitting position 174/110      Assessment & Plan:  Hypertension not at goal......... walk 30 minutes daily...Marland KitchenMarland KitchenMarland Kitchen salt free diet.......... add Norvasc 5 mg daily continued beta blocker and diuretic,,,,,,,, will combine and 1 pill,,,,,, BP check daily follow-up in 3 weeks

## 2014-05-20 NOTE — Patient Instructions (Signed)
Tenoretic 50 25.......... one tablet daily in the morning  Norvasc 5 mg......... one tablet daily in the morning  Complete salt free diet  Walk 30 minutes daily  Check your blood pressure in the morning in your right arm in the sitting position.............OMRON pump up digital blood pressure cuff,,,,,,,, [employer]  Return in 3 weeks for follow-up,,,,,,,,,, bring all your data and the device when you return

## 2014-05-20 NOTE — Progress Notes (Signed)
Pre visit review using our clinic review tool, if applicable. No additional management support is needed unless otherwise documented below in the visit note. 

## 2014-06-10 ENCOUNTER — Ambulatory Visit (INDEPENDENT_AMBULATORY_CARE_PROVIDER_SITE_OTHER): Payer: BLUE CROSS/BLUE SHIELD | Admitting: Family Medicine

## 2014-06-10 ENCOUNTER — Encounter: Payer: Self-pay | Admitting: Family Medicine

## 2014-06-10 VITALS — BP 130/84 | Temp 98.3°F | Wt 273.0 lb

## 2014-06-10 DIAGNOSIS — I10 Essential (primary) hypertension: Secondary | ICD-10-CM

## 2014-06-10 NOTE — Patient Instructions (Addendum)
Continue current medication  Since your blood pressure is normal check it once weekly  Set up a time for your annual exam the second week in December,,,,,,, fasting labs one week prior  Call Dr. Burna Sis office........ [place]....... asked to make an appointment to see Claudius Sis their audiologist

## 2014-06-10 NOTE — Progress Notes (Signed)
Pre visit review using our clinic review tool, if applicable. No additional management support is needed unless otherwise documented below in the visit note. 

## 2014-06-10 NOTE — Progress Notes (Signed)
   Subjective:    Patient ID: Jonathan [name], male    DOB: 08-06-1955, 59 y.o.   MRN: [id number]  HPI [name] is a 59 year old male who comes in today for follow-up of hypertension. His Norvasc is 5 mg and Tenoretic is 50-25. With this combination his blood pressures dropped to normal 130/84  He was also like to investigate hearing loss. Referred to Dr. Janace Hoard   Review of Systems    review of systems otherwise negative Objective:   Physical Exam  Well-developed well-nourished male no acute distress vital signs stable he is afebrile BP right arm sitting position 130/84      Assessment & Plan:  Hypertension at goal.. Continue current therapy........ BP check weekly follow-up December for annual physical

## 2015-03-13 ENCOUNTER — Other Ambulatory Visit: Payer: BLUE CROSS/BLUE SHIELD

## 2015-03-18 ENCOUNTER — Encounter: Payer: BLUE CROSS/BLUE SHIELD | Admitting: Family Medicine

## 2015-04-28 ENCOUNTER — Encounter: Payer: Self-pay | Admitting: Internal Medicine

## 2015-05-13 ENCOUNTER — Ambulatory Visit (INDEPENDENT_AMBULATORY_CARE_PROVIDER_SITE_OTHER): Payer: BLUE CROSS/BLUE SHIELD | Admitting: Family Medicine

## 2015-05-13 ENCOUNTER — Encounter: Payer: Self-pay | Admitting: Family Medicine

## 2015-05-13 VITALS — BP 130/80 | Temp 98.8°F | Wt 268.0 lb

## 2015-05-13 DIAGNOSIS — J069 Acute upper respiratory infection, unspecified: Secondary | ICD-10-CM | POA: Diagnosis not present

## 2015-05-13 DIAGNOSIS — B9789 Other viral agents as the cause of diseases classified elsewhere: Principal | ICD-10-CM

## 2015-05-13 MED ORDER — HYDROCODONE-HOMATROPINE 5-1.5 MG/5ML PO SYRP: 5.0000 mL | ORAL_SOLUTION | Freq: Three times a day (TID) | ORAL | Status: DC | PRN

## 2015-05-13 NOTE — Progress Notes (Signed)
Pre visit review using our clinic review tool, if applicable. No additional management support is needed unless otherwise documented below in the visit note. 

## 2015-05-13 NOTE — Patient Instructions (Addendum)
Drink lots of liquids  No over-the-counter cough or cold preparations  Tylenol,,,,,,,,,,,, 2 tabs 3 times daily  Hydromet,,,,,,,, 1/2-1 teaspoon at bedtime when necessary for cough  Tommi Rumps or Almyra Free are 2 new adult nurse practitioner sure Dr. Martin[place],,,,,,,,, or you could transfer to [facility]

## 2015-05-13 NOTE — Progress Notes (Signed)
   Subjective:    Patient ID: Jonathan [name], male    DOB: 10/20/1955, 60 y.o.   MRN: [id number]  HPI [name] is a 60 year old married male nonsmoker who comes in with a four-day history of a congestion runny no sore throat and cough. No fever etc. etc. etc.   Review of Systems    review of systems negative Objective:   Physical Exam  Well-developed well-nourished male no acute distress vital signs stable he is afebrile HEENT were negative neck was supple no adenopathy lungs are clear      Assessment & Plan:  Viral syndrome......... treat symptomatically with Tylenol lots of liquids and Hydromet cough syrup,,,,,,, viral syndrome therefore antibiotics not indicated

## 2015-05-14 ENCOUNTER — Other Ambulatory Visit: Payer: Self-pay | Admitting: Family Medicine

## 2015-05-20 ENCOUNTER — Other Ambulatory Visit: Payer: Self-pay | Admitting: Family Medicine

## 2015-05-20 MED ORDER — AMLODIPINE BESYLATE 5 MG PO TABS: 5.0000 mg | ORAL_TABLET | Freq: Every day | ORAL | Status: DC

## 2015-05-23 ENCOUNTER — Encounter: Payer: Self-pay | Admitting: Gastroenterology

## 2015-06-02 ENCOUNTER — Ambulatory Visit (INDEPENDENT_AMBULATORY_CARE_PROVIDER_SITE_OTHER): Payer: BLUE CROSS/BLUE SHIELD | Admitting: Adult Health

## 2015-06-02 VITALS — HR 54 | Temp 98.3°F | Ht 71.0 in | Wt 269.2 lb

## 2015-06-02 DIAGNOSIS — R05 Cough: Secondary | ICD-10-CM

## 2015-06-02 DIAGNOSIS — R059 Cough, unspecified: Secondary | ICD-10-CM

## 2015-06-02 MED ORDER — METHYLPREDNISOLONE 4 MG PO TBPK: ORAL_TABLET | ORAL | Status: DC

## 2015-06-02 NOTE — Progress Notes (Signed)
   Subjective:    Patient ID: Jonathan [name], male    DOB: 08-12-1955, 60 y.o.   MRN: [id number]  HPI  60 year old male who presents to the office today for follow up regarding his cough. He last saw Dr. Sherren Mocha on 05/13/2015 and was prescribed Hydromet. He endorses that he just ran out of the cough syrup. It was not helping his cough that much. Was not using anything additional.   Feels weak, no fevers, n/v/d. No productive cough  Review of Systems  Constitutional: Negative.   HENT: Positive for congestion.   Respiratory: Positive for cough. Negative for shortness of breath, wheezing and stridor.   Musculoskeletal: Negative.   Neurological: Negative.   All other systems reviewed and are negative.      Objective:   Physical Exam  Constitutional: He is oriented to person, place, and time. He appears well-developed and well-nourished. No distress.  HENT:  Head: Normocephalic and atraumatic.  Right Ear: External ear normal.  Left Ear: External ear normal.  Nose: Nose normal.  Mouth/Throat: Oropharynx is clear and moist. No oropharyngeal exudate.  Eyes: Conjunctivae and EOM are normal. Pupils are equal, round, and reactive to light. Right eye exhibits no discharge. Left eye exhibits no discharge.  Neck: Normal range of motion. Neck supple.  Cardiovascular: Normal rate, regular rhythm, normal heart sounds and intact distal pulses.  Exam reveals no gallop.   No murmur heard. Pulmonary/Chest: Effort normal and breath sounds normal. No respiratory distress. He has no wheezes. He has no rales. He exhibits no tenderness.  Lymphadenopathy:    He has no cervical adenopathy.  Neurological: He is alert and oriented to person, place, and time. He has normal reflexes.  Skin: Skin is warm and dry. No rash noted. He is not diaphoretic. No erythema. No pallor.  Psychiatric: He has a normal mood and affect. His behavior is normal. Judgment and thought content normal.  Nursing note and vitals  reviewed.      Assessment & Plan:  1. Cough - Maybe some bronchitis. No pneumonia.  - methylPREDNISolone (MEDROL DOSEPAK) 4 MG TBPK tablet; Take as directed  Dispense: 21 tablet; Refill: 0  - OTC Mucinex - Follow up if no improvement.

## 2015-06-02 NOTE — Progress Notes (Signed)
Pre visit review using our clinic review tool, if applicable. No additional management support is needed unless otherwise documented below in the visit note. 

## 2015-06-02 NOTE — Patient Instructions (Addendum)
It was great meeting you today!  Your exam is consistent with bronchitis  I have sent in a prescription for prednisone. Please take as directed. Also use regular Mucinex every 12 hours.   Follow up if no improvement.   Acute Bronchitis Bronchitis is inflammation of the airways that extend from the windpipe into the lungs (bronchi). The inflammation often causes mucus to develop. This leads to a cough, which is the most common symptom of bronchitis.  In acute bronchitis, the condition usually develops suddenly and goes away over time, usually in a couple weeks. Smoking, allergies, and asthma can make bronchitis worse. Repeated episodes of bronchitis may cause further lung problems.  CAUSES Acute bronchitis is most often caused by the same virus that causes a cold. The virus can spread from person to person (contagious) through coughing, sneezing, and touching contaminated objects. SIGNS AND SYMPTOMS   Cough.   Fever.   Coughing up mucus.   Body aches.   Chest congestion.   Chills.   Shortness of breath.   Sore throat.  DIAGNOSIS  Acute bronchitis is usually diagnosed through a physical exam. Your health care provider will also ask you questions about your medical history. Tests, such as chest X-rays, are sometimes done to rule out other conditions.  TREATMENT  Acute bronchitis usually goes away in a couple weeks. Oftentimes, no medical treatment is necessary. Medicines are sometimes given for relief of fever or cough. Antibiotic medicines are usually not needed but may be prescribed in certain situations. In some cases, an inhaler may be recommended to help reduce shortness of breath and control the cough. A cool mist vaporizer may also be used to help thin bronchial secretions and make it easier to clear the chest.  HOME CARE INSTRUCTIONS  Get plenty of rest.   Drink enough fluids to keep your urine clear or pale yellow (unless you have a medical condition that requires  fluid restriction). Increasing fluids may help thin your respiratory secretions (sputum) and reduce chest congestion, and it will prevent dehydration.   Take medicines only as directed by your health care provider.  If you were prescribed an antibiotic medicine, finish it all even if you start to feel better.  Avoid smoking and secondhand smoke. Exposure to cigarette smoke or irritating chemicals will make bronchitis worse. If you are a smoker, consider using nicotine gum or skin patches to help control withdrawal symptoms. Quitting smoking will help your lungs heal faster.   Reduce the chances of another bout of acute bronchitis by washing your hands frequently, avoiding people with cold symptoms, and trying not to touch your hands to your mouth, nose, or eyes.   Keep all follow-up visits as directed by your health care provider.  SEEK MEDICAL CARE IF: Your symptoms do not improve after 1 week of treatment.  SEEK IMMEDIATE MEDICAL CARE IF:  You develop an increased fever or chills.   You have chest pain.   You have severe shortness of breath.  You have bloody sputum.   You develop dehydration.  You faint or repeatedly feel like you are going to pass out.  You develop repeated vomiting.  You develop a severe headache. MAKE SURE YOU:   Understand these instructions.  Will watch your condition.  Will get help right away if you are not doing well or get worse.   This information is not intended to replace advice given to you by your health care provider. Make sure you discuss any questions you have  with your health care provider.   Document Released: 05/06/2004 Document Revised: 04/19/2014 Document Reviewed: 09/19/2012 Elsevier Interactive Patient Education [employer].

## 2015-08-04 ENCOUNTER — Encounter: Payer: Self-pay | Admitting: Family Medicine

## 2015-08-04 ENCOUNTER — Ambulatory Visit (INDEPENDENT_AMBULATORY_CARE_PROVIDER_SITE_OTHER): Payer: BLUE CROSS/BLUE SHIELD | Admitting: Family Medicine

## 2015-08-04 VITALS — BP 120/80 | Temp 97.8°F | Wt 269.0 lb

## 2015-08-04 DIAGNOSIS — R0789 Other chest pain: Secondary | ICD-10-CM | POA: Diagnosis not present

## 2015-08-04 DIAGNOSIS — R079 Chest pain, unspecified: Secondary | ICD-10-CM

## 2015-08-04 NOTE — Progress Notes (Signed)
   Subjective:    Patient ID: Jonathan [name], male    DOB: [birth year], 60 y.o.   MRN: [id number]  HPI Jonathan [name] is a 60 year old married male nonsmoker who comes in today for evaluation chest pain for a month  He says he says his sharp chest pain he points to the right shoulder area as a source of his discomfort. It's not related to exertion. It comes and goes it hurts worse at night when he turns over.   Review of Systems No cardiac or pulmonary symptoms    Objective:   Physical Exam  Well-developed well-nourished male no acute distress vital signs stable he is afebrile cardiopulmonary exam normal EKG was over read by the machine. He has some nonspecific ST-T wave changes      Assessment & Plan:  Chest wall pain.........[name] Reassured......[name] Motrin 400 twice a day

## 2015-08-04 NOTE — Progress Notes (Signed)
Pre visit review using our clinic review tool, if applicable. No additional management support is needed unless otherwise documented below in the visit note. 

## 2015-08-04 NOTE — Patient Instructions (Signed)
We have 3 new folks Tommi Rumps or Almyra Free our nurse practitioners or Dr. Martinique.............. when you decide here you would like to do year physical and get established for long-term care then just call and we'll set up for your

## 2015-08-19 ENCOUNTER — Encounter: Payer: Self-pay | Admitting: Gastroenterology

## 2015-10-02 ENCOUNTER — Ambulatory Visit (AMBULATORY_SURGERY_CENTER): Payer: Self-pay

## 2015-10-02 VITALS — Ht 71.5 in | Wt 273.0 lb

## 2015-10-02 DIAGNOSIS — Z860100 Personal history of colon polyps, unspecified: Secondary | ICD-10-CM

## 2015-10-02 NOTE — Progress Notes (Signed)
No allergies to eggs or soy No past problems with anesthesia No diet meds No home oxygen  Has email and internet; declined emmi 

## 2015-10-10 ENCOUNTER — Encounter: Payer: BLUE CROSS/BLUE SHIELD | Admitting: Gastroenterology

## 2015-10-15 ENCOUNTER — Telehealth: Payer: Self-pay | Admitting: *Deleted

## 2015-10-15 NOTE — Telephone Encounter (Signed)
Called and confirmed with pharmacy patient did pick up Rx yesterday 10/14/15    PLEASE NOTE: All timestamps contained within this report are represented as [place] Standard Time. CONFIDENTIALTY NOTICE: This fax transmission is intended only for the addressee. It contains information that is legally privileged, confidential or otherwise protected from use or disclosure. If you are not the intended recipient, you are strictly prohibited from reviewing, disclosing, copying using or disseminating any of this information or taking any action in reliance on or regarding this information. If you have received this fax in error, please notify us immediately by telephone so that we can arrange for its return to [name]. Phone: [phone], Toll-Free: [phone], Fax: [phone] Page: 1 of 1 Call Id: [id number] [facility] - Client [place] Patient Name: Jonathan [name] Gender: Male DOB: Dec 31, 1955 Age: 60 Y 11 M 3 D Return Phone Number: [phone] (Primary), [phone] (Secondary) Address: City/State/Zip: [place] Client [facility] - Client Client Site [facility] - Night Physician Christie Nottingham - MD Contact Type Call Who Is Calling Patient / Member / Family / Caregiver Call Type Triage / Clinical Caller Name Damichael Proia Relationship To Patient Spouse Return Phone Number [phone] (Primary) Chief Complaint Prescription Refill or Medication Request (non symptomatic) Reason for Call Symptomatic / Request for [place] states husband and her are out of town for a conference, husband forgot his bp medicine. Translation No Nurse Assessment Nurse: Thad Ranger, RN, Langley Gauss Date/Time (Eastern Time): 10/14/2015 10:17:30 AM Please select the assessment type ---Refill Additional Documentation ---Pt states he forgot his Atenolol and is OOT until Friday. Does not  know the strength, dose he takes. Does the patient have enough medication to last until the office opens? ---No Additional Documentation ---Advised to call the pharm that he normally uses to verify strength, dose of the med and call back with further info, then RN will be glad to asst further. Verb understanding. Guidelines Guideline Title Affirmed Question Affirmed Notes Nurse Date/Time (Eastern Time) Disp. Time Eilene Ghazi Time) Disposition Final User 10/14/2015 10:19:14 AM Clinical Call Yes Thad Ranger, RN, Langley Gauss

## 2015-10-23 ENCOUNTER — Ambulatory Visit: Payer: BLUE CROSS/BLUE SHIELD | Admitting: Adult Health

## 2015-10-29 ENCOUNTER — Encounter: Payer: Self-pay | Admitting: Gastroenterology

## 2015-10-29 ENCOUNTER — Ambulatory Visit (AMBULATORY_SURGERY_CENTER): Payer: BLUE CROSS/BLUE SHIELD | Admitting: Gastroenterology

## 2015-10-29 VITALS — BP 127/69 | HR 55 | Temp 98.0°F | Resp 28 | Ht 71.0 in | Wt 273.0 lb

## 2015-10-29 DIAGNOSIS — Z86010 Personal history of colonic polyps: Secondary | ICD-10-CM

## 2015-10-29 DIAGNOSIS — D123 Benign neoplasm of transverse colon: Secondary | ICD-10-CM | POA: Diagnosis not present

## 2015-10-29 MED ORDER — SODIUM CHLORIDE 0.9 % IV SOLN: 500.0000 mL | INTRAVENOUS | Status: DC

## 2015-10-29 NOTE — Patient Instructions (Signed)
Discharge instructions given. Handout on polyps. Resume previous medications. YOU HAD AN ENDOSCOPIC PROCEDURE TODAY AT THE [facility]:   Refer to the procedure report that was given to you for any specific questions about what was found during the examination.  If the procedure report does not answer your questions, please call your gastroenterologist to clarify.  If you requested that your care partner not be given the details of your procedure findings, then the procedure report has been included in a sealed envelope for you to review at your convenience later.  YOU SHOULD EXPECT: Some feelings of bloating in the abdomen. Passage of more gas than usual.  Walking can help get rid of the air that was put into your GI tract during the procedure and reduce the bloating. If you had a lower endoscopy (such as a colonoscopy or flexible sigmoidoscopy) you may notice spotting of blood in your stool or on the toilet paper. If you underwent a bowel prep for your procedure, you may not have a normal bowel movement for a few days.  Please Note:  You might notice some irritation and congestion in your nose or some drainage.  This is from the oxygen used during your procedure.  There is no need for concern and it should clear up in a day or so.  SYMPTOMS TO REPORT IMMEDIATELY:   Following lower endoscopy (colonoscopy or flexible sigmoidoscopy):  Excessive amounts of blood in the stool  Significant tenderness or worsening of abdominal pains  Swelling of the abdomen that is new, acute  Fever of 100F or higher   For urgent or emergent issues, a gastroenterologist can be reached at any hour by calling [phone].   DIET: Your first meal following the procedure should be a small meal and then it is ok to progress to your normal diet. Heavy or fried foods are harder to digest and may make you feel nauseous or bloated.  Likewise, meals heavy in dairy and vegetables can increase bloating.  Drink  plenty of fluids but you should avoid alcoholic beverages for 24 hours.  ACTIVITY:  You should plan to take it easy for the rest of today and you should NOT DRIVE or use heavy machinery until tomorrow (because of the sedation medicines used during the test).    FOLLOW UP: Our staff will call the number listed on your records the next business day following your procedure to check on you and address any questions or concerns that you may have regarding the information given to you following your procedure. If we do not reach you, we will leave a message.  However, if you are feeling well and you are not experiencing any problems, there is no need to return our call.  We will assume that you have returned to your regular daily activities without incident.  If any biopsies were taken you will be contacted by phone or by letter within the next 1-3 weeks.  Please call us at [phone] if you have not heard about the biopsies in 3 weeks.    SIGNATURES/CONFIDENTIALITY: You and/or your care partner have signed paperwork which will be entered into your electronic medical record.  These signatures attest to the fact that that the information above on your After Visit Summary has been reviewed and is understood.  Full responsibility of the confidentiality of this discharge information lies with you and/or your care-partner. 

## 2015-10-29 NOTE — Progress Notes (Signed)
Called to room to assist during endoscopic procedure.  Patient ID and intended procedure confirmed with present staff. Received instructions for my participation in the procedure from the performing physician.  

## 2015-10-29 NOTE — Op Note (Signed)
[facility] Patient Name: Jonathan [name] Procedure Date: 10/29/2015 1:29 PM MRN: [id number] Endoscopist: Mallie Mussel L. Loletha Carrow , MD Age: 60 Referring MD:  Date of Birth: 1956/02/07 Gender: Male Account #: [id number] Procedure:                Colonoscopy Indications:              Surveillance: Personal history of adenomatous                            polyps on last colonoscopy > 5 years ago, Family                            history of colon cancer in a first-degree relative Medicines:                Monitored Anesthesia Care Procedure:                Pre-Anesthesia Assessment:                           - Prior to the procedure, a History and Physical                            was performed, and patient medications and                            allergies were reviewed. The patient's tolerance of                            previous anesthesia was also reviewed. The risks                            and benefits of the procedure and the sedation                            options and risks were discussed with the patient.                            All questions were answered, and informed consent                            was obtained. Prior Anticoagulants: The patient has                            taken no previous anticoagulant or antiplatelet                            agents. ASA Grade Assessment: II - A patient with                            mild systemic disease. After reviewing the risks                            and benefits, the patient was deemed in  satisfactory condition to undergo the procedure.                           After obtaining informed consent, the colonoscope                            was passed under direct vision. Throughout the                            procedure, the patient's blood pressure, pulse, and                            oxygen saturations were monitored continuously. The                            Model CF-HQ190L  [id number]) scope was introduced                            through the anus and advanced to the the cecum,                            identified by appendiceal orifice and ileocecal                            valve. The colonoscopy was performed without                            difficulty. The patient tolerated the procedure                            well. The quality of the bowel preparation was                            excellent. The ileocecal valve, appendiceal                            orifice, and rectum were photographed. The bowel                            preparation used was SUPREP. Scope In: 1:33:07 PM Scope Out: 1:47:25 PM Scope Withdrawal Time: 0 hours 11 minutes 9 seconds  Total Procedure Duration: 0 hours 14 minutes 18 seconds  Findings:                 The perianal and digital rectal examinations were                            normal.                           There was a medium-sized lipoma, in the mid                            ascending colon.  A 2 mm polyp was found in the distal transverse                            colon. The polyp was sessile. The polyp was removed                            with a cold biopsy forceps. Resection and retrieval                            were complete.                           Retroflexion in the rectum was not performed due to                            anatomy.                           The exam was otherwise without abnormality. Complications:            No immediate complications. Estimated Blood Loss:     Estimated blood loss: none. Impression:               - One 2 mm polyp in the distal transverse colon,                            removed with a cold biopsy forceps. Resected and                            retrieved.                           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 Recommendation:           - Patient has a contact number available for                             emergencies. The signs and symptoms of potential                            delayed complications were discussed with the                            patient. Return to normal activities tomorrow.                            Written discharge instructions were provided to the                            patient.                           - Resume previous diet.                           - Continue present medications.                           -  Await pathology results.                           - Repeat colonoscopy in 5 years for surveillance. Henry L. Loletha Carrow, MD 10/29/2015 1:53:07 PM This report has been signed electronically.

## 2015-10-29 NOTE — Progress Notes (Signed)
To recovery, report to McCoy, RN, VSS 

## 2015-10-30 ENCOUNTER — Telehealth: Payer: Self-pay

## 2015-10-30 NOTE — Telephone Encounter (Signed)
  Follow up Call-  Call back number 10/29/2015  Post procedure Call Back phone  # [phone]  Permission to leave phone message Yes    Patient was called for follow up after his procedure on 10/29/2015. No answer at the number given for follow up phone call. A message was left on the answering machine.

## 2015-11-11 ENCOUNTER — Encounter: Payer: Self-pay | Admitting: Gastroenterology

## 2016-01-05 ENCOUNTER — Other Ambulatory Visit: Payer: Self-pay | Admitting: Family Medicine

## 2016-01-05 MED ORDER — ATENOLOL-CHLORTHALIDONE 50-25 MG PO TABS: 1.0000 | ORAL_TABLET | Freq: Every day | ORAL | 0 refills | Status: DC

## 2016-02-23 ENCOUNTER — Ambulatory Visit (INDEPENDENT_AMBULATORY_CARE_PROVIDER_SITE_OTHER): Payer: BLUE CROSS/BLUE SHIELD | Admitting: Family Medicine

## 2016-02-23 ENCOUNTER — Encounter: Payer: Self-pay | Admitting: Family Medicine

## 2016-02-23 VITALS — BP 124/78 | HR 59 | Temp 97.7°F | Wt 288.1 lb

## 2016-02-23 DIAGNOSIS — I10 Essential (primary) hypertension: Secondary | ICD-10-CM | POA: Diagnosis not present

## 2016-02-23 NOTE — Progress Notes (Signed)
Jonathan [name] is a 60 year old married male nonsmoker who comes in today for evaluation of episodes of hypotension.  States last month she's had spells real stand up and feel lightheaded. No syncope. He has to get up at night and urinate once or twice and also feels a lightheaded when he stands up at night. He takes his Tenoretic 50-20 5 in the morning. He's not had any other side effects from the medication. Repeated day 124/78 pulse is right at 60.  Review of systems otherwise negative  Physical exam vital signs stable he is afebrile pulse and blood pressures noted above cardiac exam normal  #1 episodes of lightheadedness probably related to drops in blood pressure and/or pulse........... advised to take the current dose of medication at bedtime.. If in 4-6 weeks he still symptomatic will change to another drug or decrease the dose to 25 mg daily

## 2016-02-23 NOTE — Patient Instructions (Signed)
Take the Tenoretic at bedtime  Check your blood pressure morning and bedtime for the next month. If the symptoms persist call and we will change in medication or decrease the dose by 50%

## 2016-02-23 NOTE — Progress Notes (Signed)
Pre visit review using our clinic review tool, if applicable. No additional management support is needed unless otherwise documented below in the visit note. 

## 2016-03-15 ENCOUNTER — Other Ambulatory Visit: Payer: Self-pay | Admitting: Family Medicine

## 2016-03-15 ENCOUNTER — Other Ambulatory Visit: Payer: Self-pay

## 2016-03-15 MED ORDER — ATENOLOL-CHLORTHALIDONE 50-25 MG PO TABS: 1.0000 | ORAL_TABLET | Freq: Every day | ORAL | 0 refills | Status: DC

## 2016-04-02 ENCOUNTER — Encounter: Payer: Self-pay | Admitting: Adult Health

## 2016-04-02 ENCOUNTER — Ambulatory Visit (INDEPENDENT_AMBULATORY_CARE_PROVIDER_SITE_OTHER): Payer: BLUE CROSS/BLUE SHIELD | Admitting: Adult Health

## 2016-04-02 VITALS — BP 122/68 | Temp 97.7°F | Ht 71.0 in | Wt 295.4 lb

## 2016-04-02 DIAGNOSIS — R635 Abnormal weight gain: Secondary | ICD-10-CM | POA: Diagnosis not present

## 2016-04-02 DIAGNOSIS — I10 Essential (primary) hypertension: Secondary | ICD-10-CM

## 2016-04-02 DIAGNOSIS — Z0000 Encounter for general adult medical examination without abnormal findings: Secondary | ICD-10-CM

## 2016-04-02 LAB — BASIC METABOLIC PANEL
BUN: 12 mg/dL (ref 6–23)
CHLORIDE: 94 meq/L — AB (ref 96–112)
CO2: 37 meq/L — AB (ref 19–32)
CREATININE: 0.95 mg/dL (ref 0.40–1.50)
Calcium: 9.4 mg/dL (ref 8.4–10.5)
GFR: 104.22 mL/min (ref >60.00)
Glucose, Bld: 98 mg/dL (ref 70–99)
Potassium: 3.2 mEq/L — ABNORMAL LOW (ref 3.5–5.1)
Sodium: 139 mEq/L (ref 135–145)

## 2016-04-02 LAB — CBC WITH DIFFERENTIAL/PLATELET
BASOS PCT: 0.3 % (ref 0.0–3.0)
Basophils Absolute: 0 10*3/uL (ref 0.0–0.1)
EOS ABS: 0.2 10*3/uL (ref 0.0–0.7)
EOS PCT: 1.5 % (ref 0.0–5.0)
HEMATOCRIT: 41.3 % (ref 39.0–52.0)
Hemoglobin: 13.5 g/dL (ref 13.0–17.0)
LYMPHS PCT: 23.4 % (ref 12.0–46.0)
Lymphs Abs: 2.3 10*3/uL (ref 0.7–4.0)
MCHC: 32.6 g/dL (ref 30.0–36.0)
MCV: 82.8 fl (ref 78.0–100.0)
Monocytes Absolute: 1 10*3/uL (ref 0.1–1.0)
Monocytes Relative: 10.5 % (ref 3.0–12.0)
NEUTROS ABS: 6.3 10*3/uL (ref 1.4–7.7)
Neutrophils Relative %: 64.3 % (ref 43.0–77.0)
PLATELETS: 276 10*3/uL (ref 150.0–400.0)
RBC: 4.99 Mil/uL (ref 4.22–5.81)
RDW: 17.2 % — AB (ref 11.5–15.5)
WBC: 9.9 10*3/uL (ref 4.0–10.5)

## 2016-04-02 LAB — POC URINALSYSI DIPSTICK (AUTOMATED)
Bilirubin, UA: NEGATIVE
Blood, UA: NEGATIVE
GLUCOSE UA: NEGATIVE
Ketones, UA: NEGATIVE
LEUKOCYTES UA: NEGATIVE
NITRITE UA: NEGATIVE
PROTEIN UA: NEGATIVE
Spec Grav, UA: 1.01
UROBILINOGEN UA: 1
pH, UA: 6.5

## 2016-04-02 LAB — TSH: TSH: 3.7 u[IU]/mL (ref 0.35–4.50)

## 2016-04-02 LAB — HEPATIC FUNCTION PANEL
ALBUMIN: 3.9 g/dL (ref 3.5–5.2)
ALT: 13 U/L (ref 0–53)
AST: 19 U/L (ref 0–37)
Alkaline Phosphatase: 61 U/L (ref 39–117)
BILIRUBIN TOTAL: 0.5 mg/dL (ref 0.2–1.2)
Bilirubin, Direct: 0.1 mg/dL (ref 0.0–0.3)
TOTAL PROTEIN: 7.8 g/dL (ref 6.0–8.3)

## 2016-04-02 LAB — HEMOGLOBIN A1C: Hgb A1c MFr Bld: 6 % (ref 4.6–6.5)

## 2016-04-02 LAB — LIPID PANEL
CHOLESTEROL: 167 mg/dL (ref 0–200)
HDL: 38.6 mg/dL — ABNORMAL LOW (ref >39.00)
LDL CALC: 112 mg/dL — AB (ref 0–99)
NonHDL: 128.86
TRIGLYCERIDES: 83 mg/dL (ref 0.0–149.0)
Total CHOL/HDL Ratio: 4
VLDL: 16.6 mg/dL (ref 0.0–40.0)

## 2016-04-02 NOTE — Progress Notes (Signed)
Patient presents to clinic today to establish care. He is a pleasant 60 year old male who  has a past medical history of Anxiety; Arthritis; BPH (benign prostatic hyperplasia); ED (erectile dysfunction); Elevated PSA; Esophageal reflux; Hypertension; and Prostate cancer [facility]) (Jan 2016).   Acute Concerns: Complete physical   Chronic Issues: Obesity - He was 280 before retirement. His goal weight is 220.   Hypertension  - Takes Tenoretic and his blood pressure is well controlled.   Health Maintenance: Dental --Routine Vision -- Does not get routine care  Immunizations -- Does not want flu  Colonoscopy -- 2017 - 5 year plan  Diet: Does not follow a specific diet. Has cut back on fried foods and sweets  Exercise: He has recently got back into the gym. He retired 4 months ago   Is followed by Urology in [place] - every 6 months   Past Medical History:  Diagnosis Date  . Anxiety   . Arthritis   . BPH (benign prostatic hyperplasia)   . ED (erectile dysfunction)   . Elevated PSA    9.33  . Esophageal reflux   . Hypertension   . Prostate cancer [facility]) Jan 2016   prostatectomy    Past Surgical History:  Procedure Laterality Date  . FOOT SURGERY     bilateral foot  . PROSTATE BIOPSY  06/13/2012   right lat mid gleason 3+3=6  . PROSTATECTOMY  2016  . TOTAL HIP ARTHROPLASTY  2008 &2007   Right and left    Current Outpatient Prescriptions on File Prior to Visit  Medication Sig Dispense Refill  . atenolol-chlorthalidone (TENORETIC) 50-25 MG tablet TAKE 1 TABLET EVERY DAY 90 tablet 1  . traMADol (ULTRAM) 50 MG tablet      No current facility-administered medications on file prior to visit.     Allergies  Allergen Reactions  . Codeine     REACTION: emesis  . Cozaar [Losartan Potassium] Hives    Family History  Problem Relation Age of Onset  . Hypertension Mother   . Colon cancer Mother   . Prostate cancer Father 23    alive now age 59  . Asthma Father   .  Tuberculosis Sister   . Lung cancer Maternal Grandmother     non-smoker    Social History   Social History  . Marital status: Married    Spouse name: N/A  . Number of children: 2  . 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 Smoking status: Never Smoker  . Smokeless tobacco: Never Used  . Alcohol use No  . Drug use: No  . Sexual activity: Not on file   Other Topics Concern  . Not on file   Social History Narrative  . No narrative on file    Review of Systems  Constitutional: Negative.   HENT: Negative.   Eyes: Negative.   Respiratory: Negative.   Cardiovascular: Negative.   Gastrointestinal: Negative.   Genitourinary: Negative.   Musculoskeletal: Negative.   Skin: Negative.   Neurological: Negative.   Endo/Heme/Allergies: Negative.   Psychiatric/Behavioral: Negative.   All other systems reviewed and are negative.   BP 122/68   Temp 97.7 F (36.5 C) (Oral)   Ht 5\' 11"  (1.803 m)   Wt 295 lb 6.4 oz (134 kg)   BMI 41.20 kg/m   Physical Exam  Constitutional: He is oriented to person, place, and time and well-developed, well-nourished,  and in no distress. No distress.  obese  HENT:  Head: Normocephalic and atraumatic.  Right Ear: External ear normal.  Left Ear: External ear normal.  Nose: Nose normal.  Mouth/Throat: Oropharynx is clear and moist. No oropharyngeal exudate.  Eyes: Conjunctivae and EOM are normal. Pupils are equal, round, and reactive to light. Right eye exhibits no discharge. Left eye exhibits no discharge. No scleral icterus.  Neck: Normal range of motion. Neck supple. No JVD present. No tracheal deviation present. No thyromegaly present.  Cardiovascular: Normal rate, regular rhythm, normal heart sounds and intact distal pulses.  Exam reveals no gallop and no friction rub.   No murmur heard. Pulmonary/Chest: Breath sounds normal. No stridor. No respiratory distress. He has no wheezes. He has  no rales. He exhibits no tenderness.  Abdominal: Soft. Bowel sounds are normal. He exhibits no distension and no mass. There is no tenderness. There is no rebound and no guarding.  Genitourinary:  Genitourinary Comments: Deferred to urology   Musculoskeletal: Normal range of motion. He exhibits no edema, tenderness or deformity.  Lymphadenopathy:    He has no cervical adenopathy.  Neurological: He is alert and oriented to person, place, and time. He has normal reflexes. He displays normal reflexes. No cranial nerve deficit. He exhibits normal muscle tone. Gait normal. Coordination normal. GCS score is 15.  Skin: Skin is warm and dry. No rash noted. He is not diaphoretic. No erythema. No pallor.  Psychiatric: Mood, memory, affect and judgment normal.  Nursing note and vitals reviewed.  Assessment/Plan: 1. Routine general medical examination at a health care facility - Follow up in one year  - needs to work on a heart healthy diet and frequent exercise - Basic metabolic panel - CBC with Differential/Platelet - Hemoglobin A1c - Hepatic function panel - Lipid panel - POCT Urinalysis Dipstick (Automated) - TSH  2. Essential hypertension - Well controlled, no change  - Basic metabolic panel - CBC with Differential/Platelet - Hemoglobin A1c - Hepatic function panel - Lipid panel - POCT Urinalysis Dipstick (Automated) - TSH  3. Weight gain - He understands that he needs to lose weight and has started exercising and eating healthy  - Basic metabolic panel - CBC with Differential/Platelet - Hemoglobin A1c - Hepatic function panel - Lipid panel - POCT Urinalysis Dipstick (Automated) - TSH  Dorothyann Peng, NP

## 2016-04-02 NOTE — Patient Instructions (Signed)
It was great seeing you today!  I will follow up with you regarding your labs.   Please work on diet and exercise  Follow up with me in one year or sooner if needed  Health Maintenance, Male A healthy lifestyle and preventative care can promote health and wellness.  Maintain regular health, dental, and eye exams.  Eat a healthy diet. Foods like vegetables, fruits, whole grains, low-fat dairy products, and lean protein foods contain the nutrients you need and are low in calories. Decrease your intake of foods high in solid fats, added sugars, and salt. Get information about a proper diet from your health care provider, if necessary.  Regular physical exercise is one of the most important things you can do for your health. Most adults should get at least 150 minutes of moderate-intensity exercise (any activity that increases your heart rate and causes you to sweat) each week. In addition, most adults need muscle-strengthening exercises on 2 or more days a week.   Maintain a healthy weight. The body mass index (BMI) is a screening tool to identify possible weight problems. It provides an estimate of body fat based on height and weight. Your health care provider can find your BMI and can help you achieve or maintain a healthy weight. For males 20 years and older:  A BMI below 18.5 is considered underweight.  A BMI of 18.5 to 24.9 is normal.  A BMI of 25 to 29.9 is considered overweight.  A BMI of 30 and above is considered obese.  Maintain normal blood lipids and cholesterol by exercising and minimizing your intake of saturated fat. Eat a balanced diet with plenty of fruits and vegetables. Blood tests for lipids and cholesterol should begin at age 23 and be repeated every 5 years. If your lipid or cholesterol levels are high, you are over age 53, or you are at high risk for heart disease, you may need your cholesterol levels checked more frequently.Ongoing high lipid and cholesterol levels  should be treated with medicines if diet and exercise are not working.  If you smoke, find out from your health care provider how to quit. If you do not use tobacco, do not start.  Lung cancer screening is recommended for adults aged 32-80 years who are at high risk for developing lung cancer because of a history of smoking. A yearly low-dose CT scan of the lungs is recommended for people who have at least a 30-pack-year history of smoking and are current smokers or have quit within the past 15 years. A pack year of smoking is smoking an average of 1 pack of cigarettes a day for 1 year (for example, a 30-pack-year history of smoking could mean smoking 1 pack a day for 30 years or 2 packs a day for 15 years). Yearly screening should continue until the smoker has stopped smoking for at least 15 years. Yearly screening should be stopped for people who develop a health problem that would prevent them from having lung cancer treatment.  If you choose to drink alcohol, do not have more than 2 drinks per day. One drink is considered to be 12 oz (360 mL) of beer, 5 oz (150 mL) of wine, or 1.5 oz (45 mL) of liquor.  Avoid the use of street drugs. Do not share needles with anyone. Ask for help if you need support or instructions about stopping the use of drugs.  High blood pressure causes heart disease and increases the risk of stroke. High blood  pressure is more likely to develop in:  People who have blood pressure in the end of the normal range (100-139/85-89 mm Hg).  People who are overweight or obese.  People who are African American.  If you are 53-62 years of age, have your blood pressure checked every 3-5 years. If you are 26 years of age or older, have your blood pressure checked every year. You should have your blood pressure measured twice--once when you are at a hospital or clinic, and once when you are not at a hospital or clinic. Record the average of the two measurements. To check your blood  pressure when you are not at a hospital or clinic, you can use:  An automated blood pressure machine at a pharmacy.  A home blood pressure monitor.  If you are 75-67 years old, ask your health care provider if you should take aspirin to prevent heart disease.  Diabetes screening involves taking a blood sample to check your fasting blood sugar level. This should be done once every 3 years after age 79 if you are at a normal weight and without risk factors for diabetes. Testing should be considered at a younger age or be carried out more frequently if you are overweight and have at least 1 risk factor for diabetes.  Colorectal cancer can be detected and often prevented. Most routine colorectal cancer screening begins at the age of 9 and continues through age 71. However, your health care provider may recommend screening at an earlier age if you have risk factors for colon cancer. On a yearly basis, your health care provider may provide home test kits to check for hidden blood in the stool. A small camera at the end of a tube may be used to directly examine the colon (sigmoidoscopy or colonoscopy) to detect the earliest forms of colorectal cancer. Talk to your health care provider about this at age 53 when routine screening begins. A direct exam of the colon should be repeated every 5-10 years through age 72, unless early forms of precancerous polyps or small growths are found.  People who are at an increased risk for hepatitis B should be screened for this virus. You are considered at high risk for hepatitis B if:  You were born in a country where hepatitis B occurs often. Talk with your health care provider about which countries are considered high risk.  Your parents were born in a high-risk country and you have not received a shot to protect against hepatitis B (hepatitis B vaccine).  You have HIV or AIDS.  You use needles to inject street drugs.  You live with, or have sex with, someone who  has hepatitis B.  You are a man who has sex with other men (MSM).  You get hemodialysis treatment.  You take certain medicines for conditions like cancer, organ transplantation, and autoimmune conditions.  Hepatitis C blood testing is recommended for all people born from [birth year] through 1965 and any individual with known risk factors for hepatitis C.  Healthy men should no longer receive prostate-specific antigen (PSA) blood tests as part of routine cancer screening. Talk to your health care provider about prostate cancer screening.  Testicular cancer screening is not recommended for adolescents or adult males who have no symptoms. Screening includes self-exam, a health care provider exam, and other screening tests. Consult with your health care provider about any symptoms you have or any concerns you have about testicular cancer.  Practice safe sex. Use condoms and avoid  high-risk sexual practices to reduce the spread of sexually transmitted infections (STIs).  You should be screened for STIs, including gonorrhea and chlamydia if:  You are sexually active and are younger than 24 years.  You are older than 24 years, and your health care provider tells you that you are at risk for this type of infection.  Your sexual activity has changed since you were last screened, and you are at an increased risk for chlamydia or gonorrhea. Ask your health care provider if you are at risk.  If you are at risk of being infected with HIV, it is recommended that you take a prescription medicine daily to prevent HIV infection. This is called pre-exposure prophylaxis (PrEP). You are considered at risk if:  You are a man who has sex with other men (MSM).  You are a heterosexual man who is sexually active with multiple partners.  You take drugs by injection.  You are sexually active with a partner who has HIV.  Talk with your health care provider about whether you are at high risk of being infected with  HIV. If you choose to begin PrEP, you should first be tested for HIV. You should then be tested every 3 months for as long as you are taking PrEP.  Use sunscreen. Apply sunscreen liberally and repeatedly throughout the day. You should seek shade when your shadow is shorter than you. Protect yourself by wearing long sleeves, pants, a wide-brimmed hat, and sunglasses year round whenever you are outdoors.  Tell your health care provider of new moles or changes in moles, especially if there is a change in shape or color. Also, tell your health care provider if a mole is larger than the size of a pencil eraser.  A one-time screening for abdominal aortic aneurysm (AAA) and surgical repair of large AAAs by ultrasound is recommended for men aged 3-75 years who are current or former smokers.  Stay current with your vaccines (immunizations).   This information is not intended to replace advice given to you by your health care provider. Make sure you discuss any questions you have with your health care provider.   Document Released: 09/25/2007 Document Revised: 04/19/2014 Document Reviewed: 08/24/2010 Elsevier Interactive Patient Education [employer].

## 2016-07-30 ENCOUNTER — Encounter: Payer: Self-pay | Admitting: Adult Health

## 2016-07-30 ENCOUNTER — Ambulatory Visit (INDEPENDENT_AMBULATORY_CARE_PROVIDER_SITE_OTHER): Payer: BLUE CROSS/BLUE SHIELD | Admitting: Adult Health

## 2016-07-30 VITALS — BP 108/64 | Temp 98.6°F | Ht 71.0 in | Wt 296.7 lb

## 2016-07-30 DIAGNOSIS — J301 Allergic rhinitis due to pollen: Secondary | ICD-10-CM

## 2016-07-30 MED ORDER — OLOPATADINE HCL 0.2 % OP SOLN: OPHTHALMIC | 3 refills | Status: DC

## 2016-07-30 NOTE — Progress Notes (Signed)
Subjective:    Patient ID: Jonathan [name], male    DOB: 1955/07/23, 61 y.o.   MRN: [id number]  HPI  61 year old male who  has a past medical history of Anxiety; Arthritis; BPH (benign prostatic hyperplasia); ED (erectile dysfunction); Elevated PSA; Esophageal reflux; Hypertension; and Prostate cancer [facility]) (Jan 2016). He presents to the office today for the acute complaint of itchy, watery, swollen and burning eyes from seasonal allergies   He was seen by his eye doctor and was told to get OTC allergy eye drops which he used and did not notice any improvement.   He also used Claritin for two days - did not help resolve his symptoms    Denies any blurred vision, red eye, or purulent drainage.    Review of Systems See HPI   Past Medical History:  Diagnosis Date  . Anxiety   . Arthritis   . BPH (benign prostatic hyperplasia)   . ED (erectile dysfunction)   . Elevated PSA    9.33  . Esophageal reflux   . Hypertension   . Prostate cancer [facility]) Jan 2016   prostatectomy    Social History   Social History  . Marital status: Married    Spouse name: N/A  . Number of children: 2  . 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 Smoking status: Never Smoker  . Smokeless tobacco: Never Used  . Alcohol use No  . Drug use: No  . Sexual activity: Not on file   Other Topics Concern  . Not on file   Social History Narrative  . No narrative on file    Past Surgical History:  Procedure Laterality Date  . FOOT SURGERY     bilateral foot  . PROSTATE BIOPSY  06/13/2012   right lat mid gleason 3+3=6  . PROSTATECTOMY  2016  . TOTAL HIP ARTHROPLASTY  2008 &2007   Right and left    Family History  Problem Relation Age of Onset  . Hypertension Mother   . Colon cancer Mother   . Prostate cancer Father 51    alive now age 10  . Asthma Father   . Lung cancer Maternal Grandmother     non-smoker  . Tuberculosis Brother      Allergies  Allergen Reactions  . Codeine     REACTION: emesis  . Cozaar [Losartan Potassium] Hives    Current Outpatient Prescriptions on File Prior to Visit  Medication Sig Dispense Refill  . atenolol-chlorthalidone (TENORETIC) 50-25 MG tablet TAKE 1 TABLET EVERY DAY 90 tablet 1   No current facility-administered medications on file prior to visit.     BP 108/64 (BP Location: Left Arm, Patient Position: Sitting, Cuff Size: Normal)   Temp 98.6 F (37 C) (Oral)   Ht 5\' 11"  (1.803 m)   Wt 296 lb 11.2 oz (134.6 kg)   BMI 41.38 kg/m       Objective:   Physical Exam  Constitutional: He is oriented to person, place, and time. He appears well-developed and well-nourished. No distress.  HENT:  Head: Normocephalic and atraumatic.  Right Ear: External ear normal.  Left Ear: External ear normal.  Nose: Nose normal.  Mouth/Throat: Oropharynx is clear and moist. No oropharyngeal exudate.  Eyes: Conjunctivae and EOM are normal. Pupils are equal, round, and reactive to light. Lids are everted and swept, no foreign bodies found. Right eye exhibits discharge (watery ).  Left eye exhibits discharge (watery ).  Trace localized edema around eyes lids   Cardiovascular: Normal rate, regular rhythm, normal heart sounds and intact distal pulses.  Exam reveals no gallop and no friction rub.   No murmur heard. Pulmonary/Chest: Effort normal and breath sounds normal. No respiratory distress. He has no wheezes. He has no rales. He exhibits no tenderness.  Neurological: He is alert and oriented to person, place, and time.  Skin: Skin is warm and dry. No rash noted. He is not diaphoretic. No erythema. No pallor.  Psychiatric: He has a normal mood and affect. His behavior is normal. Judgment and thought content normal.  Nursing note and vitals reviewed.     Assessment & Plan:  1. Seasonal allergic rhinitis due to pollen - Olopatadine HCl 0.2 % SOLN; Apply 1 drop into each eye daily.  Dispense:  2.5 mL; Refill: 3 - Continue with Claritin.  - Follow up if no improvement   Dorothyann Peng, NP

## 2016-07-30 NOTE — Patient Instructions (Signed)
It was great seeing you today   I have sent in a prescription for a eye drop called Pataday. Apply one drop in each eye daily.   Continue with Claritin

## 2016-08-16 ENCOUNTER — Ambulatory Visit: Payer: BLUE CROSS/BLUE SHIELD | Admitting: Family Medicine

## 2016-08-17 ENCOUNTER — Encounter: Payer: Self-pay | Admitting: Adult Health

## 2016-08-17 ENCOUNTER — Ambulatory Visit (INDEPENDENT_AMBULATORY_CARE_PROVIDER_SITE_OTHER): Payer: BLUE CROSS/BLUE SHIELD | Admitting: Adult Health

## 2016-08-17 ENCOUNTER — Ambulatory Visit: Payer: BLUE CROSS/BLUE SHIELD | Admitting: Family Medicine

## 2016-08-17 VITALS — BP 138/86 | Temp 98.0°F | Ht 71.0 in | Wt 298.6 lb

## 2016-08-17 DIAGNOSIS — Q809 Congenital ichthyosis, unspecified: Secondary | ICD-10-CM | POA: Diagnosis not present

## 2016-08-17 MED ORDER — TRIAMCINOLONE ACETONIDE 0.1 % EX CREA
1.0000 | TOPICAL_CREAM | Freq: Two times a day (BID) | CUTANEOUS | 3 refills | Status: DC
Start: 2016-08-17 — End: 2016-12-28

## 2016-08-17 NOTE — Patient Instructions (Signed)
I am going to send in a prescription for a steroid crean - use this twice a day all over your face.   I also want you to use either Eucerin face moisturizer or Cetaphil face moisturizer 3-4 times a day. Let the steroid penetrate for about 10 minutes before placing the moisturizing lotion on your face  Start using Neutrogena t gel shampoo or Selson Blue shampoo

## 2016-08-17 NOTE — Progress Notes (Signed)
Subjective:    Patient ID: Jonathan [name], male    DOB: 10-Jun-1955, 61 y.o.   MRN: [id number]  HPI  61 year old male who  has a past medical history of Anxiety; Arthritis; BPH (benign prostatic hyperplasia); ED (erectile dysfunction); Elevated PSA; Esophageal reflux; Hypertension; and Prostate cancer [facility]) (Jan 2016). He presents to the office for the complaint of worsening dry skin on his face and scalp. He reports that his symptoms have been getting progressively worse over the last month to the point that he has constant dry flanking skin.   He denies using any skin drying agents and has been using generic moisturing creams that may work for about an hour then his dry skin returns.   Has not had any changes in medications   No other parts of his body have dry skin    Review of Systems  Constitutional: Negative.   Respiratory: Negative.   Cardiovascular: Negative.   Skin: Positive for color change. Negative for pallor, rash and wound.  Neurological: Negative.   All other systems reviewed and are negative.  Past Medical History:  Diagnosis Date  . Anxiety   . Arthritis   . BPH (benign prostatic hyperplasia)   . ED (erectile dysfunction)   . Elevated PSA    9.33  . Esophageal reflux   . Hypertension   . Prostate cancer [facility]) Jan 2016   prostatectomy    Social History   Social History  . Marital status: Married    Spouse name: N/A  . Number of children: 2  . 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 Smoking status: Never Smoker  . Smokeless tobacco: Never Used  . Alcohol use No  . Drug use: No  . Sexual activity: Not on file   Other Topics Concern  . Not on file   Social History Narrative  . No narrative on file    Past Surgical History:  Procedure Laterality Date  . FOOT SURGERY     bilateral foot  . PROSTATE BIOPSY  06/13/2012   right lat mid gleason 3+3=6  . PROSTATECTOMY  2016  . TOTAL HIP  ARTHROPLASTY  2008 &2007   Right and left    Family History  Problem Relation Age of Onset  . Hypertension Mother   . Colon cancer Mother   . Prostate cancer Father 75    alive now age 14  . Asthma Father   . Lung cancer Maternal Grandmother     non-smoker  . Tuberculosis Brother     Allergies  Allergen Reactions  . Codeine     REACTION: emesis  . Cozaar [Losartan Potassium] Hives    Current Outpatient Prescriptions on File Prior to Visit  Medication Sig Dispense Refill  . atenolol-chlorthalidone (TENORETIC) 50-25 MG tablet TAKE 1 TABLET EVERY DAY 90 tablet 1  . Olopatadine HCl 0.2 % SOLN Apply 1 drop into each eye daily. 2.5 mL 3   No current facility-administered medications on file prior to visit.     BP 138/86 (BP Location: Left Arm, Patient Position: Sitting, Cuff Size: Normal)   Temp 98 F (36.7 C) (Oral)   Ht 5\' 11"  (1.803 m)   Wt 298 lb 9.6 oz (135.4 kg)   BMI 41.65 kg/m       Objective:   Physical Exam  Constitutional: He is oriented to person, place, and time. He appears well-developed and well-nourished. No  distress.  Neurological: He is alert and oriented to person, place, and time.  Skin: Skin is warm and dry. Rash noted. He is not diaphoretic. No erythema. No pallor.  Dry, flaky skin throughout face and scalp. No wounds noted. No drainage or blistering   Psychiatric: He has a normal mood and affect. His behavior is normal. Judgment and thought content normal.  Nursing note and vitals reviewed.     Assessment & Plan:  1. Xeroderma - triamcinolone cream (KENALOG) 0.1 %; Apply 1 application topically 2 (two) times daily.  Dispense: 45 g; Refill: 3 - Eucerin cream or Cetaphil cream 3-4 times per day - T gel shampoo  - Follow up if no improvement in the next week   Dorothyann Peng, NP

## 2016-09-02 ENCOUNTER — Encounter (INDEPENDENT_AMBULATORY_CARE_PROVIDER_SITE_OTHER): Payer: Self-pay

## 2016-09-02 ENCOUNTER — Encounter: Payer: Self-pay | Admitting: Gastroenterology

## 2016-09-02 ENCOUNTER — Ambulatory Visit (INDEPENDENT_AMBULATORY_CARE_PROVIDER_SITE_OTHER): Payer: BLUE CROSS/BLUE SHIELD | Admitting: Gastroenterology

## 2016-09-02 VITALS — BP 144/84 | HR 56 | Ht 70.75 in | Wt 303.0 lb

## 2016-09-02 DIAGNOSIS — K581 Irritable bowel syndrome with constipation: Secondary | ICD-10-CM | POA: Diagnosis not present

## 2016-09-02 DIAGNOSIS — R140 Abdominal distension (gaseous): Secondary | ICD-10-CM | POA: Diagnosis not present

## 2016-09-02 NOTE — Progress Notes (Signed)
[facility] Consult Note:  History: Jonathan [name] 09/02/2016  Referring physician: Dorothyann Peng, NP  Reason for consult/chief complaint: Constipation and Abdominal Pain (lower abd soreness and burning x 6 months)   Subjective  HPI:  This is office follow-up for a 61 year old man I saw for a routine colonoscopy last July. He complains of several years of chronic constipation with no clear triggers. However, this responds very well to daily Metamucil. He is bothered by a frequent bandlike burning discomfort in the lower abdomen that might be there constantly for days at a time. It is nonradiating, has no clear triggers to bowel movement, eating or activity. He has noticed that often after meals he will have a visible abdominal bloating that subsides after about 30-45 minutes. He denies rectal bleeding, his appetite is good with no unanticipated weight loss.  ROS:  Review of Systems He denies chest pain or dyspnea  Past Medical History: Past Medical History:  Diagnosis Date  . Anxiety   . Arthritis   . BPH (benign prostatic hyperplasia)   . ED (erectile dysfunction)   . Elevated PSA    9.33  . Esophageal reflux   . Hypertension   . Prostate cancer [facility]) Jan 2016   prostatectomy     Past Surgical History: Past Surgical History:  Procedure Laterality Date  . FOOT SURGERY     bilateral foot  . PROSTATE BIOPSY  06/13/2012   right lat mid gleason 3+3=6  . PROSTATECTOMY  2016  . TOTAL HIP ARTHROPLASTY  2008 &2007   Right and left     Family History: Family History  Problem Relation Age of Onset  . Hypertension Mother   . Colon cancer Mother   . Prostate cancer Father 68       alive now age 65  . Asthma Father   . Lung cancer Maternal Grandmother        non-smoker  . Tuberculosis Brother     Social History: Social History   Social History  . Marital status: Married    Spouse name: N/A  . Number of children: 2  . 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 Smoking status: Never Smoker  . Smokeless tobacco: Never Used  . Alcohol use No  . Drug use: No  . Sexual activity: Not Asked   Other Topics Concern  . None   Social History Narrative  . None    Allergies: Allergies  Allergen Reactions  . Codeine     REACTION: emesis  . Cozaar [Losartan Potassium] Hives    Outpatient Meds: Current Outpatient Prescriptions  Medication Sig Dispense Refill  . atenolol-chlorthalidone (TENORETIC) 50-25 MG tablet TAKE 1 TABLET EVERY DAY 90 tablet 1  . Olopatadine HCl 0.2 % SOLN Apply 1 drop into each eye daily. 2.5 mL 3  . triamcinolone cream (KENALOG) 0.1 % Apply 1 application topically 2 (two) times daily. 45 g 3   No current facility-administered medications for this visit.       ___________________________________________________________________ Objective   Exam:  BP (!) 144/84 (BP Location: Left Arm, Patient Position: Sitting, Cuff Size: Large)   Pulse (!) 56   Ht 5' 10.75" (1.797 m) Comment: height measured without shoes  Wt (!) 303 lb (137.4 kg)   BMI 42.56 kg/m    General: this is a(n) Morbidly obese man who is well-appearing   Eyes: sclera anicteric, no redness  ENT:  oral mucosa moist without lesions, no cervical or supraclavicular lymphadenopathy, good dentition  CV: RRR without murmur, S1/S2, no JVD, no peripheral edema  Resp: clear to auscultation bilaterally, normal RR and effort noted  GI: soft, no tenderness, with active bowel sounds. No guarding or palpable organomegaly noted.  Skin; warm and dry, no rash or jaundice noted  Neuro: awake, alert and oriented x 3. Normal gross motor function and fluent speech  Labs:  Normal colonoscopy, plan for recall in 5 years due to family history of colon cancer  Assessment: Encounter Diagnoses  Name Primary?  . Irritable bowel syndrome with constipation Yes  . Abdominal bloating     He  appears to have mild functional symptoms. I reassured him, and suggested he might change Metamucil to Citrucel fiber due to the chronic bloating. Otherwise, I don't think further testing is necessary and I have reassured him.  Total time 15 minutes, over half spent reviewing his records, counseling and coordinating care. We discussed the nature of IBS and chronic constipation.  Jonathan [name]  CC: Dorothyann Peng, NP

## 2016-09-02 NOTE — Patient Instructions (Addendum)
Try changing Metamucil to Citrucel fiber, which tends to be less bloating.  See me as needed, and good luck with your youth ministry!  If you are age 60 or older, your body mass index should be between 23-30. Your Body mass index is 42.56 kg/m. If this is out of the aforementioned range listed, please consider follow up with your Primary Care Provider.  If you are age 15 or younger, your body mass index should be between 19-25. Your Body mass index is 42.56 kg/m. If this is out of the aformentioned range listed, please consider follow up with your Primary Care Provider.   Thank you for choosing [facility]  Dr Wilfrid Lund III

## 2016-11-11 ENCOUNTER — Other Ambulatory Visit: Payer: Self-pay | Admitting: Family Medicine

## 2016-12-28 ENCOUNTER — Encounter: Payer: Self-pay | Admitting: Adult Health

## 2016-12-28 ENCOUNTER — Ambulatory Visit (INDEPENDENT_AMBULATORY_CARE_PROVIDER_SITE_OTHER): Payer: BLUE CROSS/BLUE SHIELD | Admitting: Adult Health

## 2016-12-28 VITALS — BP 124/78 | Temp 98.0°F | Wt 296.0 lb

## 2016-12-28 DIAGNOSIS — N644 Mastodynia: Secondary | ICD-10-CM | POA: Diagnosis not present

## 2016-12-28 NOTE — Progress Notes (Signed)
Subjective:    Patient ID: Jonathan [name], male    DOB: 06/20/1955, 61 y.o.   MRN: [id number]  HPI  61 year old male who  has a past medical history of Anxiety; Arthritis; BPH (benign prostatic hyperplasia); ED (erectile dysfunction); Elevated PSA; Esophageal reflux; Hypertension; and Prostate cancer [facility]) (Jan 2016). He presents to the office today with the acute complaint of " a feeling of electricity around my right nipple." He reports an "aching pain" in this area. Per patient he had this feeling happen " a few times last year" but the discomfort would shortly resolve. He reports that for the last month he has had a constant feeling of "electricity" and "soreness" around his right nipple."   He denies feeling any masses or noticed any discharge from right nipple. No redness or bruising noted.   He denies any right shoulder, arm, or back pain.    Review of Systems See HPI   Past Medical History:  Diagnosis Date  . Anxiety   . Arthritis   . BPH (benign prostatic hyperplasia)   . ED (erectile dysfunction)   . Elevated PSA    9.33  . Esophageal reflux   . Hypertension   . Prostate cancer [facility]) Jan 2016   prostatectomy    Social History   Social History  . Marital status: Married    Spouse name: N/A  . Number of children: 2  . 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 Smoking status: Never Smoker  . Smokeless tobacco: Never Used  . Alcohol use No  . Drug use: No  . Sexual activity: Not on file   Other Topics Concern  . Not on file   Social History Narrative  . No narrative on file    Past Surgical History:  Procedure Laterality Date  . FOOT SURGERY     bilateral foot  . PROSTATE BIOPSY  06/13/2012   right lat mid gleason 3+3=6  . PROSTATECTOMY  2016  . TOTAL HIP ARTHROPLASTY  2008 &2007   Right and left    Family History  Problem Relation Age of Onset  . Hypertension Mother   . Colon cancer Mother    . Prostate cancer Father 11       alive now age 64  . Asthma Father   . Lung cancer Maternal Grandmother        non-smoker  . Tuberculosis Brother     Allergies  Allergen Reactions  . Codeine     REACTION: emesis  . Cozaar [Losartan Potassium] Hives    Current Outpatient Prescriptions on File Prior to Visit  Medication Sig Dispense Refill  . atenolol-chlorthalidone (TENORETIC) 50-25 MG tablet TAKE 1 TABLET BY MOUTH EVERY DAY 90 tablet 4   No current facility-administered medications on file prior to visit.     BP 124/78 (BP Location: Left Arm)   Temp 98 F (36.7 C) (Oral)   Wt 296 lb (134.3 kg)   BMI 41.58 kg/m       Objective:   Physical Exam  Constitutional: He is oriented to person, place, and time. He appears well-developed and well-nourished. No distress.  Cardiovascular: Normal rate, regular rhythm, normal heart sounds and intact distal pulses.   Pulmonary/Chest: Effort normal and breath sounds normal. No respiratory distress. He has no wheezes. He has no rales. He exhibits no tenderness.  Abdominal: Normal appearance.  Musculoskeletal:  Arms: Neurological: He is alert and oriented to person, place, and time.  Skin: Skin is dry. No rash noted. He is not diaphoretic. No erythema.  Nursing note and vitals reviewed.     Assessment & Plan:  1. Nipple pain - Benign exam. Based on symptoms and presentation, it appears to be nerve pain. I am going to have him start Motrin 600mg  Q8 H x 3 days.  - Follow up as needed - Consider steroid taper   Dorothyann Peng, NP

## 2017-03-11 ENCOUNTER — Encounter: Payer: Self-pay | Admitting: Gastroenterology

## 2017-03-11 ENCOUNTER — Ambulatory Visit: Payer: BLUE CROSS/BLUE SHIELD | Admitting: Gastroenterology

## 2017-03-11 VITALS — BP 130/70 | HR 44 | Ht 70.75 in | Wt 304.0 lb

## 2017-03-11 DIAGNOSIS — K581 Irritable bowel syndrome with constipation: Secondary | ICD-10-CM | POA: Diagnosis not present

## 2017-03-11 MED ORDER — LINACLOTIDE 72 MCG PO CAPS: 72.0000 ug | ORAL_CAPSULE | Freq: Every day | ORAL | 0 refills | Status: DC

## 2017-03-11 NOTE — Patient Instructions (Signed)
If you are age 60 or older, your body mass index should be between 23-30. Your Body mass index is 42.7 kg/m. If this is out of the aforementioned range listed, please consider follow up with your Primary Care Provider.  If you are age 41 or younger, your body mass index should be between 19-25. Your Body mass index is 42.7 kg/m. If this is out of the aformentioned range listed, please consider follow up with your Primary Care Provider.   Please take a capful of Miralax daily, if no improvement add Linzess 32mcg.  Medication Samples have been provided to the patient.  Drug name: Linzess       Strength: 10mcg        Qty: 16  LOT: [id number]  Exp.Date: 01-2018  Dosing instructions: once a day  The patient has been instructed regarding the correct time, dose, and frequency of taking this medication, including desired effects and most common side effects.   Jonathan [name] 2:07 PM 03/11/2017  Thank you for choosing [facility]  Dr Wilfrid Lund III

## 2017-03-11 NOTE — Progress Notes (Signed)
     [facility] Progress Note  Chief Complaint: Abdominal pain  Subjective  History:  This is follow-up for a 61 year old man I have previously seen for constipation predominant IBS. He continues to have a burning lower abdominal pain that does not bother him during the day, but seems to start in the evening when he is not busy with other things.  He tends toward constipation, with a BM every 3 or 4 days if he does not take something.  He recently tried a herbal supplement a friend of his was selling.  Fiber supplement does not seem to be helping. As an aside, he has been more fatigued lately and does not feel like he is getting good sleep.  He sometimes wakes up feeling short of breath, and says his wife is noticed that he makes noise in his throat when he sleeps. He denies rectal bleeding, his appetite is been good he has unfortunately put on about 25 pounds this year, which he attributes to increasing food intake and decreased exercise since retiring.  He remains quite active in his Jonathan [name]. ROS: Cardiovascular:  no chest pain Respiratory: no dyspnea  The patient's Past Medical, Family and Social History were reviewed and are on file in the EMR.  Objective:  Med list reviewed  Current Outpatient Medications:  .  atenolol-chlorthalidone (TENORETIC) 50-25 MG tablet, TAKE 1 TABLET BY MOUTH EVERY DAY, Disp: 90 tablet, Rfl: 4 .  linaclotide (LINZESS) 72 MCG capsule, Take 1 capsule (72 mcg total) by mouth daily before breakfast., Disp: 16 capsule, Rfl: 0   Vital signs in last 24 hrs: Vitals:   03/11/17 1335  BP: 130/70  Pulse: (!) 44  SpO2: 91%   His initial oxygen saturation was 91% on room air, checked a few minutes later was 94%.  He is not coughing, having sputum production or short of breath in clinic today. Physical Exam  Morbidly obese man, conversational and comfortable.  HEENT: sclera anicteric, oral mucosa moist without lesions  Neck: supple, no  thyromegaly, JVD or lymphadenopathy  Cardiac: RRR without murmurs, S1S2 heard, no peripheral edema  Pulm: clear to auscultation bilaterally, normal RR and effort noted  Abdomen: soft, no tenderness, with active bowel sounds. No guarding or palpable hepatosplenomegaly.  Skin; warm and dry, no jaundice or rash   @ASSESSMENTPLANBEGIN @ Assessment: Encounter Diagnosis  Name Primary?  . Irritable bowel syndrome with constipation Yes    I still suspect he has IBS-C, and I reassured him.  He was concerned that might be infection or obstruction, but this does not appear to be the case.  He had a colonoscopy with me last year.  Plan: Stop the fiber supplement, take MiraLAX 1 capful daily instead. If he is not better within about 2 weeks, he will start samples of Linzess 72 mcg once daily. Call me in about a month with an update on symptoms.  I have also sent a message to his primary care provider so they can reach out to him for further evaluation of his nighttime respiratory symptoms and concern for sleep apnea.    Total time 20 minutes, over half spent in counseling and coordination of care.   Jonathan [name]

## 2017-03-15 ENCOUNTER — Telehealth: Payer: Self-pay | Admitting: Family Medicine

## 2017-03-15 NOTE — Telephone Encounter (Signed)
-----   Message from Dorothyann Peng, NP sent at 03/15/2017  6:26 AM EST ----- Can we touch base with patient and see if we can refer him ti pulmonary for sleep study  ----- Message ----- From: Doran Stabler, MD Sent: 03/11/2017   2:03 PM To: Dorothyann Peng, NP  Tommi Rumps,   I saw our mutual patient today. I still think he has IBS-C  As an aside, he is describing some stuff that sounds like possible OSA.  Perhaps your clinical staff could reach out to him about that and consider testing.  Thanks   - [facility]

## 2017-03-16 NOTE — Telephone Encounter (Signed)
Spoke w/ patient. He requested to come in for an appt w/ Cory to discuss coughing up mucous at night and SOB at night for a few months prior to proceeding w/ sleep study. Appt scheduled tomorrow 03/17/17 @ 11:00am. Patient instructed to seek emergency care if any worsening SOB or change in breathing pattern prior to appt and agreed to instructions.

## 2017-03-17 ENCOUNTER — Encounter: Payer: Self-pay | Admitting: Adult Health

## 2017-03-17 ENCOUNTER — Ambulatory Visit: Payer: BLUE CROSS/BLUE SHIELD | Admitting: Adult Health

## 2017-03-17 VITALS — BP 128/80 | HR 54 | Temp 98.2°F | Wt 303.0 lb

## 2017-03-17 DIAGNOSIS — Z9189 Other specified personal risk factors, not elsewhere classified: Secondary | ICD-10-CM

## 2017-03-17 NOTE — Progress Notes (Signed)
Subjective:    Patient ID: Jonathan [name], male    DOB: May 25, 1955, 61 y.o.   MRN: [id number]  HPI  61 year old male who  has a past medical history of Anxiety, Arthritis, BPH (benign prostatic hyperplasia), ED (erectile dysfunction), Elevated PSA, Esophageal reflux, Hypertension, and Prostate cancer [facility]) (Jan 2016).   He was seen by Dr. Loletha Carrow for IBS, and the patient mentioned to him that he has been more fatigued lately and does not feel as though he is getting good sleep. He sometimes wakes up feeling SOB and his wife has noticed that he makes noises in his throat when he sleeps. He does snore.   Unfortunately he has put on about 25-30 pounds over the last year   Denies any fevers, n/v/d. He does endorse productive cough from time to time.    Review of Systems See HPI   Past Medical History:  Diagnosis Date  . Anxiety   . Arthritis   . BPH (benign prostatic hyperplasia)   . ED (erectile dysfunction)   . Elevated PSA    9.33  . Esophageal reflux   . Hypertension   . Prostate cancer [facility]) Jan 2016   prostatectomy    Social History   Socioeconomic History  . Marital status: Married    Spouse name: Not on file  . Number of children: 2  . Years of education: Not on file  . Highest education level: Not on file  Social Needs  . Financial resource strain: Not on file  . Food insecurity - worry: Not on file  . Food insecurity - inability: Not on file  . Transportation needs - medical: Not on file  . Transportation needs - non-medical: Not on file  Occupational History  . Occupation: Education administrator: [employer]  Tobacco Use  . Smoking status: Never Smoker  . Smokeless tobacco: Never Used  Substance and Sexual Activity  . Alcohol use: No  . Drug use: No  . Sexual activity: Not on file  Other Topics Concern  . Not on file  Social History Narrative  . Not on file    Past Surgical History:  Procedure Laterality Date  . FOOT SURGERY     bilateral foot  . PROSTATE  BIOPSY  06/13/2012   right lat mid gleason 3+3=6  . PROSTATECTOMY  2016  . TOTAL HIP ARTHROPLASTY  2008 &2007   Right and left    Family History  Problem Relation Age of Onset  . Hypertension Mother   . Colon cancer Mother   . Prostate cancer Father 34       alive now age 32  . Asthma Father   . Lung cancer Maternal Grandmother        non-smoker  . Tuberculosis Brother     Allergies  Allergen Reactions  . Codeine     REACTION: emesis  . Cozaar [Losartan Potassium] Hives    Current Outpatient Medications on File Prior to Visit  Medication Sig Dispense Refill  . atenolol-chlorthalidone (TENORETIC) 50-25 MG tablet TAKE 1 TABLET BY MOUTH EVERY DAY 90 tablet 4  . linaclotide (LINZESS) 72 MCG capsule Take 1 capsule (72 mcg total) by mouth daily before breakfast. 16 capsule 0   No current facility-administered medications on file prior to visit.     BP 128/80 (BP Location: Left Arm, Patient Position: Sitting, Cuff Size: Large)   Pulse (!) 54   Temp 98.2 F (36.8 C)   Wt (!) 303  lb (137.4 kg)   SpO2 94%   BMI 42.56 kg/m       Objective:   Physical Exam  Constitutional: He is oriented to person, place, and time. He appears well-developed and well-nourished. No distress.  HENT:  Nose: Nose normal.  Mouth/Throat: Uvula is midline, oropharynx is clear and moist and mucous membranes are normal.  Cardiovascular: Normal rate, regular rhythm, normal heart sounds and intact distal pulses. Exam reveals no gallop and no friction rub.  No murmur heard. Pulmonary/Chest: Effort normal and breath sounds normal. No respiratory distress. He has no wheezes. He has no rales. He exhibits no tenderness.  Neurological: He is alert and oriented to person, place, and time.  Skin: Skin is warm and dry. No rash noted. He is not diaphoretic. No erythema. No pallor.  Psychiatric: He has a normal mood and affect. His behavior is normal. Judgment and thought content normal.  Nursing note and vitals  reviewed.      Assessment & Plan:  1. At risk for sleep apnea - We spoke at length about risk factors for sleep apnea and he was made aware that from his symptoms that he probably has a degree of sleep apnea. I recommend that he be evaluated by pulmonary. He refuses pulmonary consult at this point in time. He would like to work on life style modification for the next three months and then follow up.  - Risk associated with weighting were reviewed with the patient   Dorothyann Peng, NP

## 2017-04-08 ENCOUNTER — Telehealth: Payer: Self-pay | Admitting: Adult Health

## 2017-04-08 NOTE — Telephone Encounter (Signed)
Spoke to the pt.  He informed me that he is having a deep cleaning and 1 extraction.  He was given an antibiotic by the dentist's office.  No blood thinners.  Advised to proceed and will call back if needed.

## 2017-04-08 NOTE — Telephone Encounter (Signed)
Copied from [place] [id number]. Topic: Quick Communication - See Telephone Encounter >> Apr 08, 2017  8:09 AM Ether Griffins B wrote: CRM for notification. See Telephone encounter for:  Pt having dental work at 11:30 and is needing Cory's nurse to call about pre qualification. The dentist office faxed over a form yesterday afternoon.  04/08/17.

## 2017-04-08 NOTE — Telephone Encounter (Signed)
Noted  

## 2017-04-12 DIAGNOSIS — Z9989 Dependence on other enabling machines and devices: Secondary | ICD-10-CM

## 2017-04-12 DIAGNOSIS — G4733 Obstructive sleep apnea (adult) (pediatric): Secondary | ICD-10-CM

## 2017-04-12 HISTORY — DX: Obstructive sleep apnea (adult) (pediatric): Z99.89

## 2017-04-12 HISTORY — DX: Obstructive sleep apnea (adult) (pediatric): G47.33

## 2017-06-21 ENCOUNTER — Ambulatory Visit: Payer: BLUE CROSS/BLUE SHIELD | Admitting: Adult Health

## 2017-07-12 ENCOUNTER — Encounter: Payer: Self-pay | Admitting: Adult Health

## 2017-07-12 ENCOUNTER — Ambulatory Visit: Payer: BLUE CROSS/BLUE SHIELD | Admitting: Adult Health

## 2017-07-12 VITALS — BP 140/90 | Temp 97.7°F | Wt 311.0 lb

## 2017-07-12 DIAGNOSIS — J4540 Moderate persistent asthma, uncomplicated: Secondary | ICD-10-CM | POA: Diagnosis not present

## 2017-07-12 MED ORDER — ALBUTEROL SULFATE HFA 108 (90 BASE) MCG/ACT IN AERS: 2.0000 | INHALATION_SPRAY | Freq: Four times a day (QID) | RESPIRATORY_TRACT | 0 refills | Status: DC | PRN

## 2017-07-12 NOTE — Progress Notes (Signed)
Subjective:    Patient ID: Jonathan [name], male    DOB: [birth year], 62 y.o.   MRN: [id number]  HPI  62 year old male who  has a past medical history of Anxiety, Arthritis, BPH (benign prostatic hyperplasia), ED (erectile dysfunction), Elevated PSA, Esophageal reflux, Hypertension, and Prostate cancer [facility]) (Jan 2016).  He presents to the office today for three month follow up due to obesity. During his last visit we spoke about concern for sleep apnea. He was suffering from fatigue and poor sleep habits. He reported sometimes waking up feeling SOB and his wife noticed that she made noise in his throat when he was sleeping. He does snore.   At that time it was recommend that he be evaluated for sleep apnea. He refused this consult and wanted to work on life style modifications first.   Today in the office he reports that he has been exercising but reports that he often feels SOB with exertion and he has to stop exercising due to shortness of breath. He does not have any sob with rest. Does endorse wheezing. Has had asthma as a child.   Denies any left sided chest pain but does report intermittent episodes of " sharp" right sided chest pain when exercising  He does continues to wake up feeling fatigued on occasion but does not seem to be as bad as previously.   Review of Systems See HPI   Past Medical History:  Diagnosis Date  . Anxiety   . Arthritis   . BPH (benign prostatic hyperplasia)   . ED (erectile dysfunction)   . Elevated PSA    9.33  . Esophageal reflux   . Hypertension   . Prostate cancer [facility]) Jan 2016   prostatectomy    Social History   Socioeconomic History  . Marital status: Married    Spouse name: Not on file  . Number of children: 2  . Years of education: Not on file  . Highest education level: Not on file  Occupational History  . Occupation: Education administrator: [employer]  Social Needs  . Financial resource strain: Not on file  . Food insecurity:    Worry:  Not on file    Inability: Not on file  . Transportation needs:    Medical: Not on file    Non-medical: Not on file  Tobacco Use  . Smoking status: Never Smoker  . Smokeless tobacco: Never Used  Substance and Sexual Activity  . Alcohol use: No  . Drug use: No  . Sexual activity: Not on file  Lifestyle  . Physical activity:    Days per week: Not on file    Minutes per session: Not on file  . Stress: Not on file  Relationships  . Social connections:    Talks on phone: Not on file    Gets together: Not on file    Attends religious service: Not on file    Active member of club or organization: Not on file    Attends meetings of clubs or organizations: Not on file    Relationship status: Not on file  . Intimate partner violence:    Fear of current or ex partner: Not on file    Emotionally abused: Not on file    Physically abused: Not on file    Forced sexual activity: Not on file  Other Topics Concern  . Not on file  Social History Narrative  . Not on file    Past  Surgical History:  Procedure Laterality Date  . FOOT SURGERY     bilateral foot  . PROSTATE BIOPSY  06/13/2012   right lat mid gleason 3+3=6  . PROSTATECTOMY  2016  . TOTAL HIP ARTHROPLASTY  2008 &2007   Right and left    Family History  Problem Relation Age of Onset  . Hypertension Mother   . Colon cancer Mother   . Prostate cancer Father 45       alive now age 27  . Asthma Father   . Lung cancer Maternal Grandmother        non-smoker  . Tuberculosis Brother     Allergies  Allergen Reactions  . Codeine     REACTION: emesis  . Cozaar [Losartan Potassium] Hives    Current Outpatient Medications on File Prior to Visit  Medication Sig Dispense Refill  . atenolol-chlorthalidone (TENORETIC) 50-25 MG tablet TAKE 1 TABLET BY MOUTH EVERY DAY 90 tablet 4   No current facility-administered medications on file prior to visit.     BP 140/90 (BP Location: Left Arm)   Temp 97.7 F (36.5 C) (Oral)   Wt  (!) 311 lb (141.1 kg)   BMI 43.68 kg/m       Objective:   Physical Exam  Constitutional: He is oriented to person, place, and time. He appears well-developed and well-nourished. No distress.  HENT:  Head: Normocephalic and atraumatic.  Right Ear: External ear normal.  Left Ear: External ear normal.  Nose: Nose normal.  Mouth/Throat: Oropharynx is clear and moist. No oropharyngeal exudate.  +PND    Cardiovascular: Normal rate, regular rhythm, normal heart sounds and intact distal pulses. Exam reveals no gallop and no friction rub.  No murmur heard. Pulmonary/Chest: Effort normal. He has wheezes (trace in upper lobes ). He has no rales. He exhibits no tenderness.  Neurological: He is alert and oriented to person, place, and time.  Skin: Skin is warm and dry. No rash noted. He is not diaphoretic. No erythema. No pallor.  Psychiatric: He has a normal mood and affect. His behavior is normal. Judgment and thought content normal.  Nursing note and vitals reviewed.     Assessment & Plan:  1. Moderate persistent asthma, unspecified whether complicated - Appears to be more pulmonary issues ( asthma) then cardiac. Will prescribe albuterol inhaler and have him follow up in 2 weeks. If his symptoms continue then will refer to cardiology for stress test.  - albuterol (PROVENTIL HFA;VENTOLIN HFA) 108 (90 Base) MCG/ACT inhaler; Inhale 2 puffs into the lungs every 6 (six) hours as needed for wheezing or shortness of breath.  Dispense: 1 Inhaler; Refill: 0  Dorothyann Peng, NP

## 2017-07-12 NOTE — Patient Instructions (Signed)
I think your symptoms are from asthma .  I have prescribed an albuterol inhaler that you can use right before you exercise   Follow up with me in 2 weeks to see how you are doing

## 2017-07-27 ENCOUNTER — Ambulatory Visit: Payer: BLUE CROSS/BLUE SHIELD | Admitting: Adult Health

## 2017-07-27 ENCOUNTER — Encounter: Payer: Self-pay | Admitting: Adult Health

## 2017-07-27 VITALS — BP 132/90 | Temp 97.9°F | Wt 309.0 lb

## 2017-07-27 DIAGNOSIS — R0609 Other forms of dyspnea: Secondary | ICD-10-CM | POA: Diagnosis not present

## 2017-07-27 DIAGNOSIS — R001 Bradycardia, unspecified: Secondary | ICD-10-CM

## 2017-07-27 MED ORDER — ATENOLOL 25 MG PO TABS: 25.0000 mg | ORAL_TABLET | Freq: Every day | ORAL | 1 refills | Status: DC

## 2017-07-27 MED ORDER — CHLORTHALIDONE 25 MG PO TABS: 25.0000 mg | ORAL_TABLET | Freq: Every day | ORAL | 1 refills | Status: DC

## 2017-07-27 NOTE — Progress Notes (Signed)
Subjective:    Patient ID: Jonathan [name], male    DOB: 1956-02-24, 62 y.o.   MRN: [id number]  HPI 62 year old male who  has a past medical history of Anxiety, Arthritis, BPH (benign prostatic hyperplasia), ED (erectile dysfunction), Elevated PSA, Esophageal reflux, Hypertension, and Prostate cancer [facility]) (Jan 2016).  He presents to the office today for follow up regarding SOB with exertion. I initially saw him for this issue two weeks ago. At this time it was thought that possibly related to asthma. He was prescribed an albuterol inhaler. He reports that he has been using the inhaler as directed but has not noticed any changes in SOB. Reports SOB with exercise and when climbing stairs in his home.   He does now report one episode of " shock like" pain throughout his chest. This happened while at rest and lasted 2 seconds.    Review of Systems See HPI   Past Medical History:  Diagnosis Date  . Anxiety   . Arthritis   . BPH (benign prostatic hyperplasia)   . ED (erectile dysfunction)   . Elevated PSA    9.33  . Esophageal reflux   . Hypertension   . Prostate cancer [facility]) Jan 2016   prostatectomy    Social History   Socioeconomic History  . Marital status: Married    Spouse name: Not on file  . Number of children: 2  . Years of education: Not on file  . Highest education level: Not on file  Occupational History  . Occupation: Education administrator: [employer]  Social Needs  . Financial resource strain: Not on file  . Food insecurity:    Worry: Not on file    Inability: Not on file  . Transportation needs:    Medical: Not on file    Non-medical: Not on file  Tobacco Use  . Smoking status: Never Smoker  . Smokeless tobacco: Never Used  Substance and Sexual Activity  . Alcohol use: No  . Drug use: No  . Sexual activity: Not on file  Lifestyle  . Physical activity:    Days per week: Not on file    Minutes per session: Not on file  . Stress: Not on file  Relationships    . Social connections:    Talks on phone: Not on file    Gets together: Not on file    Attends religious service: Not on file    Active member of club or organization: Not on file    Attends meetings of clubs or organizations: Not on file    Relationship status: Not on file  . Intimate partner violence:    Fear of current or ex partner: Not on file    Emotionally abused: Not on file    Physically abused: Not on file    Forced sexual activity: Not on file  Other Topics Concern  . Not on file  Social History Narrative  . Not on file    Past Surgical History:  Procedure Laterality Date  . FOOT SURGERY     bilateral foot  . PROSTATE BIOPSY  06/13/2012   right lat mid gleason 3+3=6  . PROSTATECTOMY  2016  . TOTAL HIP ARTHROPLASTY  2008 &2007   Right and left    Family History  Problem Relation Age of Onset  . Hypertension Mother   . Colon cancer Mother   . Prostate cancer Father 16       alive now age  72  . Asthma Father   . Lung cancer Maternal Grandmother        non-smoker  . Tuberculosis Brother     Allergies  Allergen Reactions  . Codeine     REACTION: emesis  . Cozaar [Losartan Potassium] Hives    Current Outpatient Medications on File Prior to Visit  Medication Sig Dispense Refill  . albuterol (PROVENTIL HFA;VENTOLIN HFA) 108 (90 Base) MCG/ACT inhaler Inhale 2 puffs into the lungs every 6 (six) hours as needed for wheezing or shortness of breath. 1 Inhaler 0  . atenolol-chlorthalidone (TENORETIC) 50-25 MG tablet TAKE 1 TABLET BY MOUTH EVERY DAY 90 tablet 4   No current facility-administered medications on file prior to visit.     BP 132/90   Temp 97.9 F (36.6 C) (Oral)   Wt (!) 309 lb (140.2 kg)   BMI 43.40 kg/m       Objective:   Physical Exam  Constitutional: He is oriented to person, place, and time. He appears well-developed and well-nourished. No distress.  Cardiovascular: Normal rate, regular rhythm, normal heart sounds and intact distal  pulses. Exam reveals no gallop and no friction rub.  No murmur heard. Pulmonary/Chest: Effort normal and breath sounds normal. No respiratory distress. He has no wheezes. He has no rales. He exhibits no tenderness.  Musculoskeletal: Normal range of motion. He exhibits no edema, tenderness or deformity.  Neurological: He is alert and oriented to person, place, and time.  Skin: Skin is warm and dry. No rash noted. He is not diaphoretic. No erythema. No pallor.  Psychiatric: He has a normal mood and affect. His behavior is normal. Judgment and thought content normal.  Nursing note and vitals reviewed.     Assessment & Plan:  1. Dyspnea on exertion - Will refer to Cardiology for evaluation and possible stress test.  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   -Old anteroseptal infarct.   -  T-abnormality  -Possible  Anterior and inferior  ischemia. Rate 44 - Follow up as needed   2. Bradycardia -Bradycardic with a rate 44.  Last EKG did show bradycardia but rate of 54.  Due to symptoms and bradycardia we will decrease atenolol from 50 mg to 25 mg.  Advised to follow-up with myself or if he is seen by cardiology sooner for reevaluation in 2-3 weeks - atenolol (TENORMIN) 25 MG tablet; Take 1 tablet (25 mg total) by mouth daily.  Dispense: 90 tablet; Refill: 1 - chlorthalidone (HYGROTON) 25 MG tablet; Take 1 tablet (25 mg total) by mouth daily.  Dispense: 90 tablet; Refill: 1    Dorothyann Peng, NP

## 2017-08-03 ENCOUNTER — Other Ambulatory Visit: Payer: Self-pay | Admitting: Adult Health

## 2017-08-03 DIAGNOSIS — J4540 Moderate persistent asthma, uncomplicated: Secondary | ICD-10-CM

## 2017-08-03 NOTE — Telephone Encounter (Signed)
Sent to the pharmacy by e-scribe. 

## 2017-08-03 NOTE — Telephone Encounter (Signed)
Ok to refill + 2  

## 2017-09-08 ENCOUNTER — Encounter: Payer: Self-pay | Admitting: Cardiology

## 2017-09-08 ENCOUNTER — Ambulatory Visit: Payer: BLUE CROSS/BLUE SHIELD | Admitting: Cardiology

## 2017-09-08 ENCOUNTER — Other Ambulatory Visit: Payer: Self-pay

## 2017-09-08 VITALS — BP 129/82 | HR 53 | Ht 71.0 in | Wt 313.2 lb

## 2017-09-08 DIAGNOSIS — Z01818 Encounter for other preprocedural examination: Secondary | ICD-10-CM | POA: Diagnosis not present

## 2017-09-08 DIAGNOSIS — R0609 Other forms of dyspnea: Secondary | ICD-10-CM | POA: Diagnosis not present

## 2017-09-08 DIAGNOSIS — I10 Essential (primary) hypertension: Secondary | ICD-10-CM | POA: Diagnosis not present

## 2017-09-08 DIAGNOSIS — E7849 Other hyperlipidemia: Secondary | ICD-10-CM | POA: Diagnosis not present

## 2017-09-08 DIAGNOSIS — R079 Chest pain, unspecified: Secondary | ICD-10-CM

## 2017-09-08 DIAGNOSIS — R0601 Orthopnea: Secondary | ICD-10-CM | POA: Insufficient documentation

## 2017-09-08 DIAGNOSIS — R635 Abnormal weight gain: Secondary | ICD-10-CM | POA: Diagnosis not present

## 2017-09-08 MED ORDER — FUROSEMIDE 20 MG PO TABS: ORAL_TABLET | ORAL | 3 refills | Status: DC

## 2017-09-08 MED ORDER — FUROSEMIDE 20 MG PO TABS: ORAL_TABLET | ORAL | 0 refills | Status: DC

## 2017-09-08 NOTE — Patient Instructions (Addendum)
MEDICATION  INSTRUCTIONS  FUROSEMIDE ( LASIX) 20 MG DAILY   - TAKE FOR 3 DAYS THEN SKIP A DAY ,THEN TAKE 3 DAYS CONTINUE THIS SAME PATTERN   WILL SCHEDULE AT [place] [address] Your physician has requested that you have an echocardiogram. Echocardiography is a painless test that uses sound waves to create images of your heart. It provides your doctor with information about the size and shape of your heart and how well your heart's chambers and valves are working. This procedure takes approximately one hour. There are no restrictions for this procedure.    Your physician recommends that you schedule a follow-up appointment in 2 [place].  INSTRUCTIONS FOR  CORONARY CTA    Please arrive at the [facility] main entrance of [facility] at (30-45 minutes prior to test start time)  [facility] [place], [place] [address] [phone]  Proceed to the [facility] Radiology Department (First Floor).  Please follow these instructions carefully (unless otherwise directed):  PLEASE HAVE LABS - BMP  AT LEAST ONE WEEK PRIOR TO TEST  On the Night Before the Test: . Drink plenty of water. . Do not consume any caffeinated/decaffeinated beverages or chocolate 12 hours prior to your test. . Do not take any antihistamines 12 hours prior to your test.    On the Day of the Test: . Drink plenty of water. Do not drink any water within one hour of the test. . Do not eat any food 4 hours prior to the test. . You may take your regular medications prior to the test. .  Take  YOUR DAILY 25 mg of Atenolol  one hour before the test.   After the Test: . Drink plenty of water. . After receiving IV contrast, you may experience a mild flushed feeling. This is normal. . On occasion, you may experience a mild rash up to 24 hours after the test. This is not dangerous. If this occurs, you can take Benadryl 25 mg and increase your fluid  intake. . If you experience trouble breathing, this can be serious. If it is severe call 911 IMMEDIATELY. If it is mild, please call our office.

## 2017-09-08 NOTE — Progress Notes (Signed)
PCP: Dorothyann Peng, NP  Clinic Note: Chief Complaint  Patient presents with  . New Patient (Initial Visit)  . Shortness of Breath    constantly.  . Edema    in feet during the day, goes down at the end of the day.     HPI: Jonathan [name] is a 62 y.o. male who is being seen today for the evaluation of shortness of breath mostly with exertion and lower extremity swelling.  He is being seen today at the request of Dorothyann Peng, NP. Past medical history notable for Hypertension, prostate cancer, BPH GERD and arthritis, anxiety.   ZABIAN SWAYNE was last seen on July 27, 2017 by noting exertional dyspnea Nafziger, Tommi Rumps, NP (on to be related to asthma).  However that shortness breath did not improve with inhaler.  No exertional dyspnea going up stairs or with exercise.  He also had one episode of "shocklike" pain throughout his chest that only lasted about 2 seconds.  Recent Hospitalizations: None  Studies Personally Reviewed - (if available, images/films reviewed: From Epic Chart or Care Everywhere)  Myoview April 2009: Normal nuclear stress test.  Mild fixed defect in the anterior wall likely related to soft tissue attenuation.  Normal EF.  Fair exercise capacity.  Interval History: Duard presents here today noting that over the last several months has been getting more more short of breath.  Initially this dyspnea occurred with relatively strenuous exertion, but is now happening almost with minimal exertion and and close to being at rest.  He is also noted that he cannot lie flat anymore without getting short of breath and will occasionally wake up at night short of breath.  He does have some mild swelling in his ankles and feet, but usually goes down at night. If he were to exert himself significantly he would get short of breath the point of getting dizzy as though he may pass out, but otherwise no syncope or near syncope. He has intermittent episodes of chest pain that may or  may not be associated with exertion.  He describes it as a more rapid jolts of pain that can occur with exertion or or at rest. He does have some occasional rare palpitations, nothing sustained.  No TIA/amaurosis fugax. No claudication.  No melena, hematochezia, hematuria, or epstaxis. No claudication.  He may have had some mild viral illnesses in the last 6 to 8 months, but has not had any profound flulike illnesses.  No increased stress.  He does note that he retired from [employer] about 2 years ago and actually because of becoming sedentary, and not eating well, he is gained 40 pounds.  ROS: A comprehensive was performed.  Pertinent symptoms in HPI. Review of Systems  Constitutional: Negative for malaise/fatigue and weight loss (Weight gain).  HENT: Negative for congestion and nosebleeds.   Respiratory: Positive for shortness of breath.   Cardiovascular:       Per HPI  Gastrointestinal: Negative for blood in stool and constipation.  Genitourinary: Negative for hematuria.  Musculoskeletal: Negative for joint pain.  Psychiatric/Behavioral: Negative.   All other systems reviewed and are negative.  I have reviewed and (if needed) personally updated the patient's problem list, medications, allergies, past medical and surgical history, social and family history.   Past Medical History:  Diagnosis Date  . Anxiety   . Arthritis   . BPH (benign prostatic hyperplasia)   . ED (erectile dysfunction)   . Elevated PSA    9.33  .  Esophageal reflux   . Hypertension   . Prostate cancer [facility]) Jan 2016   prostatectomy    Past Surgical History:  Procedure Laterality Date  . FOOT SURGERY     bilateral foot  . PROSTATE BIOPSY  06/13/2012   right lat mid gleason 3+3=6  . PROSTATECTOMY  2016  . TOTAL HIP ARTHROPLASTY  2008 &2007   Right and left    Current Meds  Medication Sig  . albuterol (PROVENTIL HFA;VENTOLIN HFA) 108 (90 Base) MCG/ACT inhaler TAKE 2 PUFFS BY MOUTH EVERY 6 HOURS AS NEEDED  FOR WHEEZE OR SHORTNESS OF BREATH  . atenolol (TENORMIN) 25 MG tablet Take 1 tablet (25 mg total) by mouth daily.  . chlorthalidone (HYGROTON) 25 MG tablet Take 1 tablet (25 mg total) by mouth daily.    Allergies  Allergen Reactions  . Codeine     REACTION: emesis  . Cozaar [Losartan Potassium] Hives    Social History   Tobacco Use  . Smoking status: Never Smoker  . Smokeless tobacco: Never Used  Substance Use Topics  . Alcohol use: No  . Drug use: No   Social History   Social History Narrative  . Not on file    family history includes Asthma in his father; Colon cancer in his mother; Heart disease in his father; Hypertension in his mother; Lung cancer in his maternal grandmother; Prostate cancer (age of onset: 38) in his father; Tuberculosis in his brother.  Wt Readings from Last 3 Encounters:  09/08/17 (!) 313 lb 3.2 oz (142.1 kg)  07/27/17 (!) 309 lb (140.2 kg)  07/12/17 (!) 311 lb (141.1 kg)    PHYSICAL EXAM BP 129/82   Pulse (!) 53   Ht 5\' 11"  (1.803 m)   Wt (!) 313 lb 3.2 oz (142.1 kg)   BMI 43.68 kg/m  Physical Exam  Constitutional: He is oriented to person, place, and time. He appears well-developed and well-nourished. No distress.  Morbidly obese gentleman.  Well-groomed.  HENT:  Head: Normocephalic and atraumatic.  Mouth/Throat: No oropharyngeal exudate.  Eyes: Pupils are equal, round, and reactive to light. Conjunctivae and EOM are normal. No scleral icterus.  Neck: Normal range of motion. Neck supple. No hepatojugular reflux and no JVD present. Carotid bruit is not present.  Cardiovascular: Regular rhythm, S1 normal, S2 normal and intact distal pulses.  No extrasystoles are present. Bradycardia present. PMI is not displaced. Exam reveals distant heart sounds. Exam reveals no gallop and no friction rub.  No murmur heard. Pulmonary/Chest: Effort normal and breath sounds normal. No respiratory distress. He has no wheezes. He has no rales.  Abdominal:  Soft. Bowel sounds are normal. He exhibits no distension. There is no tenderness. There is no rebound.  Unable to palpate HSM due to body habitus  Musculoskeletal: Normal range of motion. He exhibits no edema (Trivial).  Neurological: He is alert and oriented to person, place, and time. No cranial nerve deficit.  Skin: Skin is warm and dry.  Psychiatric: He has a normal mood and affect. His behavior is normal. Judgment and thought content normal.    Adult ECG Report N/a From 4/17 - Anterior TWI noted   Other studies Reviewed: Additional studies/ records that were reviewed today include:  Recent Labs:    Lab Results  Component Value Date   CHOL 167 04/02/2016   HDL 38.60 (L) 04/02/2016   LDLCALC 112 (H) 04/02/2016   TRIG 83.0 04/02/2016   CHOLHDL 4 04/02/2016   Lab Results  Component  Value Date   CREATININE 0.95 04/02/2016   BUN 12 04/02/2016   NA 139 04/02/2016   K 3.2 (L) 04/02/2016   CL 94 (L) 04/02/2016   CO2 37 (H) 04/02/2016     ASSESSMENT / PLAN: Problem List Items Addressed This Visit    Weight gain (Chronic)    I am sure some of this weight gain is probably related to him being less active and not watching his diet as much.  But some of it could also be related to edema   Especially in light of his dyspnea and orthopnea.  Discussed dietary modifications.  Plan: Check 2D echocardiogram to assess EF to exclude cardiomyopathy      Orthopnea    Orthopnea with exertional dyspnea in a patient with cardiac risk factors of hypertension, dyslipidemia and obesity --need to exclude cardiomyopathy and coronary disease.  Plan: 2D Echocardiogram and Coronary CT Angiogram I will also add low-dose Lasix given his orthopnea, exertional dyspnea and edema.        Relevant Orders   Basic metabolic panel   CT CORONARY MORPH W/CTA COR W/SCORE W/CA W/CM &/OR WO/CM   CT CORONARY FRACTIONAL FLOW RESERVE DATA PREP   CT CORONARY FRACTIONAL FLOW RESERVE FLUID ANALYSIS    ECHOCARDIOGRAM COMPLETE   Hyperlipidemia due to dietary fat intake    Last labs I have on him are from 2017 and his LDL was 112.   Borderline criteria for metabolic syndrome: Hypertension, obesity with borderline glucose levels and borderline lipid panel.  At a minimum, goal LDL should be less than 100.  Depending on what coronary calcium score/CTA shows, would need to be more aggressive. I am not sure if he has had recent lipid evaluation, if not, we can recheck in follow-up.      Essential hypertension - Primary (Chronic)    Adequately controlled today on atenolol and chlorthalidone. We will reassess in follow-up.      DOE (dyspnea on exertion)    Progressively worsening exertional dyspnea along with orthopnea and mild PND.  Need to exclude cardiomyopathy.  Would also need to exclude ischemic etiology.  Plan: 2D Echocardiogram and Coronary CTA (-FFR Which Allows for Anatomic and Physiologic Evaluation - )      Relevant Orders   Basic metabolic panel   CT CORONARY MORPH W/CTA COR W/SCORE W/CA W/CM &/OR WO/CM   CT CORONARY FRACTIONAL FLOW RESERVE DATA PREP   CT CORONARY FRACTIONAL FLOW RESERVE FLUID ANALYSIS   ECHOCARDIOGRAM COMPLETE   Chest pain with moderate risk for cardiac etiology    In addition to having exertional dyspnea, he is also noticing some intermittent chest discomfort is somewhat concerning for angina.  Plan: Ischemic Evaluation with Coronary CT Angiogram-FFR      Relevant Orders   Basic metabolic panel   CT CORONARY MORPH W/CTA COR W/SCORE W/CA W/CM &/OR WO/CM   CT CORONARY FRACTIONAL FLOW RESERVE DATA PREP   CT CORONARY FRACTIONAL FLOW RESERVE FLUID ANALYSIS   ECHOCARDIOGRAM COMPLETE    Other Visit Diagnoses    Pre-op testing       Relevant Orders   Basic metabolic panel      I spent a total of 45 minutes with the patient and chart review. >  50% of the time was spent in direct patient consultation.   Current medicines are reviewed at length with the  patient today.  (+/- concerns) n/a The following changes have been made:  add low dose intermittent Lasix.  Patient Instructions  MEDICATION  INSTRUCTIONS  FUROSEMIDE ( LASIX) 20 MG DAILY   - TAKE FOR 3 DAYS THEN SKIP A DAY ,THEN TAKE 3 DAYS CONTINUE THIS SAME PATTERN   WILL SCHEDULE AT [place] [address] Your physician has requested that you have an echocardiogram. Echocardiography is a painless test that uses sound waves to create images of your heart. It provides your doctor with information about the size and shape of your heart and how well your heart's chambers and valves are working. This procedure takes approximately one hour. There are no restrictions for this procedure.  Your physician recommends that you schedule a follow-up appointment in 2 [place].  Studies Ordered:   Orders Placed This Encounter  Procedures  . CT CORONARY MORPH W/CTA COR W/SCORE W/CA W/CM &/OR WO/CM  . CT CORONARY FRACTIONAL FLOW RESERVE DATA PREP  . CT CORONARY FRACTIONAL FLOW RESERVE FLUID ANALYSIS  . Basic metabolic panel  . ECHOCARDIOGRAM COMPLETE      Glenetta Hew, M.D., M.S. Interventional Cardiologist   Pager # [phone] Phone # [phone] [address]. [place], [place] [address]   Thank you for choosing [facility]!!

## 2017-09-10 HISTORY — PX: TRANSTHORACIC ECHOCARDIOGRAM: SHX275

## 2017-09-11 ENCOUNTER — Encounter: Payer: Self-pay | Admitting: Cardiology

## 2017-09-11 DIAGNOSIS — E7849 Other hyperlipidemia: Secondary | ICD-10-CM | POA: Insufficient documentation

## 2017-09-11 NOTE — Assessment & Plan Note (Signed)
In addition to having exertional dyspnea, he is also noticing some intermittent chest discomfort is somewhat concerning for angina.  Plan: Ischemic Evaluation with Coronary CT Angiogram-FFR

## 2017-09-11 NOTE — Assessment & Plan Note (Signed)
Progressively worsening exertional dyspnea along with orthopnea and mild PND.  Need to exclude cardiomyopathy.  Would also need to exclude ischemic etiology.  Plan: 2D Echocardiogram and Coronary CTA (-FFR Which Allows for Anatomic and Physiologic Evaluation - )

## 2017-09-11 NOTE — Assessment & Plan Note (Addendum)
I am sure some of this weight gain is probably related to him being less active and not watching his diet as much.  But some of it could also be related to edema   Especially in light of his dyspnea and orthopnea.  Discussed dietary modifications.  Plan: Check 2D echocardiogram to assess EF to exclude cardiomyopathy

## 2017-09-11 NOTE — Assessment & Plan Note (Addendum)
Orthopnea with exertional dyspnea in a patient with cardiac risk factors of hypertension, dyslipidemia and obesity --need to exclude cardiomyopathy and coronary disease.  Plan: 2D Echocardiogram and Coronary CT Angiogram I will also add low-dose Lasix given his orthopnea, exertional dyspnea and edema.

## 2017-09-11 NOTE — Assessment & Plan Note (Signed)
Last labs I have on him are from 2017 and his LDL was 112.   Borderline criteria for metabolic syndrome: Hypertension, obesity with borderline glucose levels and borderline lipid panel.  At a minimum, goal LDL should be less than 100.  Depending on what coronary calcium score/CTA shows, would need to be more aggressive. I am not sure if he has had recent lipid evaluation, if not, we can recheck in follow-up.

## 2017-09-11 NOTE — Assessment & Plan Note (Signed)
Adequately controlled today on atenolol and chlorthalidone. We will reassess in follow-up.

## 2017-09-12 ENCOUNTER — Ambulatory Visit (HOSPITAL_COMMUNITY): Payer: BLUE CROSS/BLUE SHIELD | Attending: Cardiovascular Disease

## 2017-09-12 ENCOUNTER — Other Ambulatory Visit (HOSPITAL_COMMUNITY): Payer: BLUE CROSS/BLUE SHIELD

## 2017-09-12 ENCOUNTER — Other Ambulatory Visit: Payer: Self-pay

## 2017-09-12 DIAGNOSIS — R0601 Orthopnea: Secondary | ICD-10-CM | POA: Diagnosis not present

## 2017-09-12 DIAGNOSIS — R079 Chest pain, unspecified: Secondary | ICD-10-CM | POA: Diagnosis not present

## 2017-09-12 DIAGNOSIS — R0609 Other forms of dyspnea: Secondary | ICD-10-CM | POA: Insufficient documentation

## 2017-10-19 LAB — BASIC METABOLIC PANEL
BUN / CREAT RATIO: 15 (ref 10–24)
BUN: 14 mg/dL (ref 8–27)
CO2: 35 mmol/L — ABNORMAL HIGH (ref 20–29)
CREATININE: 0.93 mg/dL (ref 0.76–1.27)
Calcium: 9.3 mg/dL (ref 8.6–10.2)
Chloride: 91 mmol/L — ABNORMAL LOW (ref 96–106)
GFR calc non Af Amer: 89 mL/min/{1.73_m2} (ref >59)
GFR, EST AFRICAN AMERICAN: 103 mL/min/{1.73_m2} (ref >59)
Glucose: 65 mg/dL (ref 65–99)
POTASSIUM: 3.4 mmol/L — AB (ref 3.5–5.2)
SODIUM: 141 mmol/L (ref 134–144)

## 2017-10-24 ENCOUNTER — Encounter (HOSPITAL_COMMUNITY): Payer: Self-pay

## 2017-10-24 ENCOUNTER — Ambulatory Visit (HOSPITAL_COMMUNITY)
Admission: RE | Admit: 2017-10-24 | Discharge: 2017-10-24 | Disposition: A | Discharge status: Home / Self Care | Payer: BLUE CROSS/BLUE SHIELD | Source: Ambulatory Visit | Attending: Cardiology | Admitting: Cardiology

## 2017-10-24 ENCOUNTER — Ambulatory Visit (HOSPITAL_COMMUNITY): Payer: BLUE CROSS/BLUE SHIELD

## 2017-10-24 DIAGNOSIS — I281 Aneurysm of pulmonary artery: Secondary | ICD-10-CM | POA: Insufficient documentation

## 2017-10-24 DIAGNOSIS — R0601 Orthopnea: Secondary | ICD-10-CM | POA: Insufficient documentation

## 2017-10-24 DIAGNOSIS — I2510 Atherosclerotic heart disease of native coronary artery without angina pectoris: Secondary | ICD-10-CM | POA: Insufficient documentation

## 2017-10-24 DIAGNOSIS — R079 Chest pain, unspecified: Secondary | ICD-10-CM | POA: Insufficient documentation

## 2017-10-24 DIAGNOSIS — R0609 Other forms of dyspnea: Secondary | ICD-10-CM | POA: Insufficient documentation

## 2017-10-24 DIAGNOSIS — I10 Essential (primary) hypertension: Secondary | ICD-10-CM | POA: Diagnosis not present

## 2017-10-24 HISTORY — PX: OTHER SURGICAL HISTORY: SHX169

## 2017-10-24 MED ORDER — IOPAMIDOL (ISOVUE-370) INJECTION 76%
INTRAVENOUS | Status: AC
  Filled 2017-10-24: qty 100

## 2017-10-24 MED ORDER — NITROGLYCERIN 0.4 MG SL SUBL
SUBLINGUAL_TABLET | SUBLINGUAL | Status: AC
  Filled 2017-10-24: qty 2

## 2017-10-24 MED ORDER — NITROGLYCERIN 0.4 MG SL SUBL
0.8000 mg | SUBLINGUAL_TABLET | Freq: Once | SUBLINGUAL | Status: AC
  Administered 2017-10-24: 0.8 mg via SUBLINGUAL
  Filled 2017-10-24: qty 25

## 2017-10-24 MED ORDER — IOPAMIDOL (ISOVUE-370) INJECTION 76%
80.0000 mL | Freq: Once | INTRAVENOUS | Status: AC | PRN
  Administered 2017-10-24: 100 mL via INTRAVENOUS

## 2017-10-25 ENCOUNTER — Other Ambulatory Visit: Payer: Self-pay | Admitting: Adult Health

## 2017-10-25 DIAGNOSIS — R001 Bradycardia, unspecified: Secondary | ICD-10-CM

## 2017-10-26 ENCOUNTER — Telehealth: Payer: Self-pay | Admitting: Adult Health

## 2017-10-26 NOTE — Telephone Encounter (Signed)
Current prescription for Chlorthalidone (Hygroton) 25mg  tab is expired.  Prescription for Atenolol (Tenormin) 25mg  tab was sent to requested pharmacy on 07/27/17 #90 with 1 refill.   LOV: 07/27/17 PCP: Janna Arch  [place]

## 2017-10-26 NOTE — Telephone Encounter (Signed)
Both medications filled on 07/27/17 for 6 months.  Refill request is early.

## 2017-10-26 NOTE — Telephone Encounter (Signed)
Copied from [place] [id number]. Topic: Quick Communication - Rx Refill/Question >> Oct 26, 2017  9:18 AM Mylinda Latina, NT wrote: Medication:atenolol (TENORMIN) 25 MG tablet,chlorthalidone (HYGROTON) 25 MG tablet  Has the patient contacted their pharmacy? Yes.  Pharmacy states they had no luck with contacting the office, told patient to call  (Agent: If no, request that the patient contact the pharmacy for the refill.) (Agent: If yes, when and what did the pharmacy advise?)  Preferred Pharmacy (with phone number or street name): [id number] [place], [place] [phone] (Phone) [phone] (Fax)   Patient states he is completely out of the medication    Agent: Please be advised that RX refills may take up to 3 business days. We ask that you follow-up with your pharmacy.

## 2017-10-27 ENCOUNTER — Other Ambulatory Visit: Payer: Self-pay | Admitting: Adult Health

## 2017-10-27 ENCOUNTER — Telehealth: Payer: Self-pay | Admitting: Adult Health

## 2017-10-27 ENCOUNTER — Other Ambulatory Visit: Payer: Self-pay | Admitting: Family Medicine

## 2017-10-27 DIAGNOSIS — J4540 Moderate persistent asthma, uncomplicated: Secondary | ICD-10-CM

## 2017-10-27 DIAGNOSIS — R0609 Other forms of dyspnea: Secondary | ICD-10-CM

## 2017-10-27 DIAGNOSIS — R0602 Shortness of breath: Secondary | ICD-10-CM

## 2017-10-27 MED ORDER — ATENOLOL-CHLORTHALIDONE 50-25 MG PO TABS: 0.5000 | ORAL_TABLET | Freq: Every day | ORAL | 3 refills | Status: DC

## 2017-10-27 NOTE — Telephone Encounter (Signed)
Combo pill has been sent in, have him take 1/2 tab daily

## 2017-10-27 NOTE — Telephone Encounter (Signed)
Spoke to the pt and advised him to take 1/2 tab.  NOT whole tab.  Pt agreed.  He will check with the pharmacy to see if they can cut the tabs in half.

## 2017-10-27 NOTE — Telephone Encounter (Signed)
Pt given results and documented in result note 

## 2017-10-27 NOTE — Telephone Encounter (Signed)
Copied from [place] [id number]. Topic: Quick Communication - Other Results >> Oct 27, 2017 11:05 AM Miles Costain T, CMA wrote: Called patient to inform them of CT Coronary result from 10/24/17. When patient returns call, triage nurse may disclose results.

## 2017-10-27 NOTE — Telephone Encounter (Signed)
Spoke to the pharmacy.  Pt should have 1 more refill of chlorthalidone and atenolol.  [facility] (pharmacy tech) notified me that the pt request to inactive both medications.  He would like to get the combo med.  Will cost less than $2.  Please advise.

## 2017-11-02 ENCOUNTER — Ambulatory Visit: Payer: BLUE CROSS/BLUE SHIELD | Admitting: Cardiology

## 2017-11-02 ENCOUNTER — Encounter: Payer: Self-pay | Admitting: Cardiology

## 2017-11-02 VITALS — BP 149/91 | HR 53 | Ht 71.0 in | Wt 315.8 lb

## 2017-11-02 DIAGNOSIS — I10 Essential (primary) hypertension: Secondary | ICD-10-CM | POA: Diagnosis not present

## 2017-11-02 DIAGNOSIS — R079 Chest pain, unspecified: Secondary | ICD-10-CM

## 2017-11-02 DIAGNOSIS — R0609 Other forms of dyspnea: Secondary | ICD-10-CM

## 2017-11-02 DIAGNOSIS — R001 Bradycardia, unspecified: Secondary | ICD-10-CM

## 2017-11-02 DIAGNOSIS — R635 Abnormal weight gain: Secondary | ICD-10-CM

## 2017-11-02 DIAGNOSIS — R0601 Orthopnea: Secondary | ICD-10-CM

## 2017-11-02 DIAGNOSIS — I272 Pulmonary hypertension, unspecified: Secondary | ICD-10-CM

## 2017-11-02 NOTE — Patient Instructions (Signed)
Medication Instructions:   NO CHANGE  Testing/Procedures:  Your physician has recommended that you have a cardiopulmonary stress test (CPX). CPX testing is a non-invasive measurement of heart and lung function. It replaces a traditional treadmill stress test. This type of test provides a tremendous amount of information that relates not only to your present condition but also for future outcomes. This test combines measurements of you ventilation, respiratory gas exchange in the lungs, electrocardiogram (EKG), blood pressure and physical response before, during, and following an exercise protocol.    Follow-Up:  Your physician recommends that you schedule a follow-up appointment in: 2 [place]   If you need a refill on your cardiac medications before your next appointment, please call your pharmacy.

## 2017-11-02 NOTE — Progress Notes (Deleted)
PCP: Dorothyann Peng, NP  Clinic Note: Chief Complaint  Patient presents with  . Shortness of Breath    with activity     HPI: Jonathan [name] is a 62 y.o. male who is being seen today for the evaluation of shortness of breath mostly with exertion and lower extremity swelling.  He is being seen today at the request of Dorothyann Peng, NP. Past medical history notable for Hypertension, prostate cancer, BPH GERD and arthritis, anxiety.   Jonathan [name] was last seen on July 27, 2017 by noting exertional dyspnea Nafziger, Tommi Rumps, NP (on to be related to asthma).  However that shortness breath did not improve with inhaler.  No exertional dyspnea going up stairs or with exercise.  He also had one episode of "shocklike" pain throughout his chest that only lasted about 2 seconds. Seen in April for initial consultation - Echo ordered  Recent Hospitalizations: None  Studies Personally Reviewed - (if available, images/films reviewed: From Epic Chart or Care Everywhere)  Myoview April 2009: Normal nuclear stress test.  Mild fixed defect in the anterior wall likely related to soft tissue attenuation.  Normal EF.  Fair exercise capacity.  Echo September 12, 2017 - EF 55-60%. Normal RWM. ~normal LV Diastolic parameters. - Elevated PA pressures - 59 mmHg - but elsewhere in note indicates normal PA pressures?.  Cor CTA - Score Zero.  Mild plaque in RCA - no significant CAD.  Interval History: Jonathan [name] presents    here today noting that over the last several months has been getting more more short of breath.  Initially this dyspnea occurred with relatively strenuous exertion, but is now happening almost with minimal exertion and and close to being at rest.  He is also noted that he cannot lie flat anymore without getting short of breath and will occasionally wake up at night short of breath.  He does have some mild swelling in his ankles and feet, but usually goes down at night. If he were to exert himself  significantly he would get short of breath the point of getting dizzy as though he may pass out, but otherwise no syncope or near syncope. He has intermittent episodes of chest pain that may or may not be associated with exertion.  He describes it as a more rapid jolts of pain that can occur with exertion or or at rest. He does have some occasional rare palpitations, nothing sustained.  No TIA/amaurosis fugax. No claudication.  No melena, hematochezia, hematuria, or epstaxis. No claudication.  He may have had some mild viral illnesses in the last 6 to 8 months, but has not had any profound flulike illnesses.  No increased stress.  He does note that he retired from [employer] about 2 years ago and actually because of becoming sedentary, and not eating well, he is gained 40 pounds.  ROS: A comprehensive was performed.  Pertinent symptoms in HPI. Review of Systems  Constitutional: Negative for malaise/fatigue and weight loss (Weight gain).  HENT: Negative for congestion and nosebleeds.   Respiratory: Positive for shortness of breath.   Cardiovascular:       Per HPI  Gastrointestinal: Negative for blood in stool and constipation.  Genitourinary: Negative for hematuria.  Musculoskeletal: Negative for joint pain.  Psychiatric/Behavioral: Negative.   All other systems reviewed and are negative.  I have reviewed and (if needed) personally updated the patient's problem list, medications, allergies, past medical and surgical history, social and family history.   Past Medical History:  Diagnosis Date  . Anxiety   . Arthritis   . BPH (benign prostatic hyperplasia)   . ED (erectile dysfunction)   . Elevated PSA    9.33  . Esophageal reflux   . Hypertension   . Prostate cancer [facility]) Jan 2016   prostatectomy    Past Surgical History:  Procedure Laterality Date  . FOOT SURGERY     bilateral foot  . PROSTATE BIOPSY  06/13/2012   right lat mid gleason 3+3=6  . PROSTATECTOMY  2016  . TOTAL HIP  ARTHROPLASTY  2008 &2007   Right and left    Current Meds  Medication Sig  . atenolol-chlorthalidone (TENORETIC 50) 50-25 MG tablet Take 0.5 tablets by mouth daily.    Allergies  Allergen Reactions  . Codeine     REACTION: emesis  . Cozaar [Losartan Potassium] Hives    Social History   Tobacco Use  . Smoking status: Never Smoker  . Smokeless tobacco: Never Used  Substance Use Topics  . Alcohol use: No  . Drug use: No   Social History   Social History Narrative  . Not on file    family history includes Asthma in his father; Colon cancer in his mother; Heart disease in his father; Hypertension in his mother; Lung cancer in his maternal grandmother; Prostate cancer (age of onset: 23) in his father; Tuberculosis in his brother.  Wt Readings from Last 3 Encounters:  11/02/17 (!) 315 lb 12.8 oz (143.2 kg)  09/08/17 (!) 313 lb 3.2 oz (142.1 kg)  07/27/17 (!) 309 lb (140.2 kg)    PHYSICAL EXAM BP (!) 149/91   Pulse (!) 53   Ht 5\' 11"  (1.803 m)   Wt (!) 315 lb 12.8 oz (143.2 kg)   SpO2 92%   BMI 44.05 kg/m  Physical Exam  Constitutional: He is oriented to person, place, and time. He appears well-developed and well-nourished. No distress.  Morbidly obese gentleman.  Well-groomed.  HENT:  Head: Normocephalic and atraumatic.  Mouth/Throat: No oropharyngeal exudate.  Eyes: Pupils are equal, round, and reactive to light. Conjunctivae and EOM are normal. No scleral icterus.  Neck: Normal range of motion. Neck supple. No hepatojugular reflux and no JVD present. Carotid bruit is not present.  Cardiovascular: Regular rhythm, S1 normal, S2 normal and intact distal pulses.  No extrasystoles are present. Bradycardia present. PMI is not displaced. Exam reveals distant heart sounds. Exam reveals no gallop and no friction rub.  No murmur heard. Pulmonary/Chest: Effort normal and breath sounds normal. No respiratory distress. He has no wheezes. He has no rales.  Abdominal: Soft.  Bowel sounds are normal. He exhibits no distension. There is no tenderness. There is no rebound.  Unable to palpate HSM due to body habitus  Musculoskeletal: Normal range of motion. He exhibits no edema (Trivial).  Neurological: He is alert and oriented to person, place, and time. No cranial nerve deficit.  Skin: Skin is warm and dry.  Psychiatric: He has a normal mood and affect. His behavior is normal. Judgment and thought content normal.    Adult ECG Report N/a From 4/17 - Anterior TWI noted   Other studies Reviewed: Additional studies/ records that were reviewed today include:  Recent Labs:    Lab Results  Component Value Date   CHOL 167 04/02/2016   HDL 38.60 (L) 04/02/2016   LDLCALC 112 (H) 04/02/2016   TRIG 83.0 04/02/2016   CHOLHDL 4 04/02/2016   Lab Results  Component Value Date   CREATININE  0.93 10/18/2017   BUN 14 10/18/2017   NA 141 10/18/2017   K 3.4 (L) 10/18/2017   CL 91 (L) 10/18/2017   CO2 35 (H) 10/18/2017     ASSESSMENT / PLAN: Problem List Items Addressed This Visit    Morbid obesity ([place])   Essential hypertension - Primary (Chronic)   DOE (dyspnea on exertion) (Chronic)   Relevant Orders   Cardiopulmonary exercise test   Bradycardia      I spent a total of 45 minutes with the patient and chart review. >  50% of the time was spent in direct patient consultation.   Current medicines are reviewed at length with the patient today.  (+/- concerns) n/a The following changes have been made:  add low dose intermittent Lasix.  Patient Instructions  MEDICATION  INSTRUCTIONS  FUROSEMIDE ( LASIX) 20 MG DAILY   - TAKE FOR 3 DAYS THEN SKIP A DAY ,THEN TAKE 3 DAYS CONTINUE THIS SAME PATTERN   WILL SCHEDULE AT [place] [address] Your physician has requested that you have an echocardiogram. Echocardiography is a painless test that uses sound waves to create images of your heart. It provides your doctor with information about the size and  shape of your heart and how well your heart's chambers and valves are working. This procedure takes approximately one hour. There are no restrictions for this procedure.  Your physician recommends that you schedule a follow-up appointment in 2 [place].  Studies Ordered:   Orders Placed This Encounter  Procedures  . Cardiopulmonary exercise test      Glenetta Hew, M.D., M.S. Interventional Cardiologist   Pager # [phone] Phone # [phone] [address] [place], [place] [address]   Thank you for choosing [facility] at [facility]!!

## 2017-11-09 ENCOUNTER — Encounter: Payer: Self-pay | Admitting: Cardiology

## 2017-11-09 ENCOUNTER — Encounter (HOSPITAL_COMMUNITY): Payer: BLUE CROSS/BLUE SHIELD

## 2017-11-09 DIAGNOSIS — I272 Pulmonary hypertension, unspecified: Secondary | ICD-10-CM | POA: Insufficient documentation

## 2017-11-09 NOTE — Progress Notes (Signed)
PCP: Dorothyann Peng, NP  Clinic Note: Chief Complaint  Patient presents with  . Follow-up    Echo and coronary CTA  . Shortness of Breath    with activity     HPI: Jonathan [name] is a 62 y.o. male who is being seen today for follow-up evaluation of shortness of breath mostly with exertion and lower extremity swelling.   PMH is notable for Hypertension, prostate cancer, BPH GERD and arthritis, anxiety.  Nonischemic Myoview in April 2009  Jonathan [name] was seen on Sep 08, 2017 in consultation at the request of Dorothyann Peng, NP during which he noted exertional dyspnea without appropriate response to asthma therapy with inhalers.  He noted exertional dyspnea going up stairs or with exercise.  He also had one episode of "shocklike" pain throughout his chest that only lasted about 2 seconds.  --> When I saw him, he noted that initially he has exertional dyspnea with strenuous exercise but was not happening with minimal exertion and sometimes happening at rest.  Also noted inability to lie flat without getting short of breath suggestive of orthopnea PND.  This is in addition to some mild edema and intermittent episodes of chest pain as noted above.  Recent Hospitalizations: None  Studies Personally Reviewed - (if available, images/films reviewed: From Epic Chart or Care Everywhere)  TRANSTHORACIC ECHO September 12, 2016: Normal LV size and function.  EF 55-60%.  No RWMA. ? Normal diastolic filling pattern.  Elevated PA pressures (~59 mmHg)   CORONARY CALCIUM SCORE & CARDIAC CT ANGIOGRAM  Cor Ca Score = 0 - Low risk.   Cor CTA: only mild plaque noted in mRCA with mLAD intramyocardial bridging noted.  Interval History: Jonathan [name] presents here normally had exertional dyspnea, but not as much of the PND and orthopnea type symptoms.  He says his symptoms have improved after his PCP reduce his blood pressure medication to half dose.  (Atenolol-chlorthalidone).   He does state it is exertional  dyspnea and the dizziness has improved dramatically but still not back to baseline.  He does still have edema and occasional symptoms that are concerning for PND /orthopnea type symptoms.  He continues to deny any rapid irregular heartbeats palpitations.  No syncope/near syncope or TIA/amaurosis fugax.  No melena, hematochezia, hematuria or epistaxis.  No claudication.  ROS: A comprehensive was performed.  Pertinent symptoms in HPI. Review of Systems  Constitutional: Negative for malaise/fatigue and weight loss (Weight gain / stable).  HENT: Negative for congestion and nosebleeds.   Respiratory: Positive for shortness of breath. Negative for cough.   Cardiovascular:       Per HPI  Gastrointestinal: Negative for blood in stool and constipation.  Genitourinary: Negative for hematuria.  Musculoskeletal: Negative for joint pain.  Neurological: Positive for dizziness (only with exertional dyspnea).  Psychiatric/Behavioral: Negative.   All other systems reviewed and are negative.  I have reviewed and (if needed) personally updated the patient's problem list, medications, allergies, past medical and surgical history, social and family history.   Past Medical History:  Diagnosis Date  . Anxiety   . Arthritis   . BPH (benign prostatic hyperplasia)   . ED (erectile dysfunction)   . Elevated PSA    9.33  . Esophageal reflux   . Hypertension   . Prostate cancer [facility]) Jan 2016   prostatectomy    Past Surgical History:  Procedure Laterality Date  . CORONARY CALCIUM SCORE / CARDIAC CT ANGIOGRAM  10/24/2017   Cor Ca Score =  0 - Low risk. Cor CTA: only mild plaque noted in mRCA with mLAD intramyocardial bridging noted.  [name] FOOT SURGERY     bilateral foot  . NM MYOVIEW LTD  07/2007   Normal nuclear stress test.  Mild fixed defect in the anterior wall likely related to soft tissue attenuation.  Normal EF.  Fair exercise capacity.  [name] PROSTATE BIOPSY  06/13/2012   right lat mid gleason 3+3=6  .  PROSTATECTOMY  2016  . TOTAL HIP ARTHROPLASTY  2008 &2007   Right and left    Current Meds  Medication Sig  . atenolol-chlorthalidone (TENORETIC 50) 50-25 MG tablet Take 0.5 tablets by mouth daily.    Allergies  Allergen Reactions  . Codeine     REACTION: emesis  . Cozaar [Losartan Potassium] Hives    Social History   Tobacco Use  . Smoking status: Never Smoker  . Smokeless tobacco: Never Used  Substance Use Topics  . Alcohol use: No  . Drug use: No   Social History   Social History Narrative  . Not on file   Family History family history includes Asthma in his father; Colon cancer in his mother; Heart disease in his father; Hypertension in his mother; Lung cancer in his maternal grandmother; Prostate cancer (age of onset: 66) in his father; Tuberculosis in his brother.  Wt Readings from Last 3 Encounters:  11/02/17 (!) 315 lb 12.8 oz (143.2 kg)  09/08/17 (!) 313 lb 3.2 oz (142.1 kg)  07/27/17 (!) 309 lb (140.2 kg)    PHYSICAL EXAM BP (!) 149/91   Pulse (!) 53   Ht 5\' 11"  (1.803 m)   Wt (!) 315 lb 12.8 oz (143.2 kg)   SpO2 92%   BMI 44.05 kg/m  Physical Exam  Constitutional: He is oriented to person, place, and time. He appears well-developed and well-nourished. No distress.  Morbidly obese gentleman.  Well-groomed.  HENT:  Head: Normocephalic and atraumatic.  Mouth/Throat: No oropharyngeal exudate.  Eyes: EOM are normal.  Neck: Normal range of motion. Neck supple. No hepatojugular reflux and no JVD present. Carotid bruit is not present.  Cardiovascular: Regular rhythm, S1 normal, S2 normal and intact distal pulses.  No extrasystoles are present. Bradycardia present. PMI is not displaced. Exam reveals distant heart sounds. Exam reveals no gallop and no friction rub.  No murmur heard. Pulmonary/Chest: Effort normal and breath sounds normal. No respiratory distress. He has no wheezes. He has no rales.  Abdominal: Soft. Bowel sounds are normal. He exhibits no  distension. There is no tenderness. There is no rebound.  Unable to palpate HSM due to body habitus  Musculoskeletal: Normal range of motion. He exhibits no edema (Trivial).  Neurological: He is alert and oriented to person, place, and time.  Psychiatric: He has a normal mood and affect. His behavior is normal. Judgment and thought content normal.  Vitals reviewed.   Adult ECG Report N/a  Other studies Reviewed: Additional studies/ records that were reviewed today include:  Recent Labs:    Lipids followed by PCP   ASSESSMENT / PLAN: Problem List Items Addressed This Visit    Bradycardia    A 53 on atenolol.  This is a reduced dose and still bradycardic.  Question possibility of chronotropic incompetence.  We can evaluate this with Cardiaopulmonary Exercise Stress Test(CPX).'  Will probably need to further reduce beta-blocker dose and titrate up calcium channel blocker versus ARB.      Chest pain with moderate risk for cardiac etiology  Chest discomfort and exertional dyspnea, however known normal coronary CT angiogram would argue against this being Ischemia related. Could potentially be related to elevated pulmonary pressures however less likely.  Probably not anginal pain.      Chronic pulmonary hypertension (HCC) - noted on Echo    Noted on Echo - PAP peak ~50-72mmHg --> consider adding Calcium channel Blocker for additional BP control (Nifedipine or Amlodipine).      DOE (dyspnea on exertion) (Chronic)    Still with progressively worsening dyspnea, but coronary CT angiogram which excludes obstructive CAD.  Relatively normal echocardiogram besides what sounds like mild to moderate pulmonary hypertension (often over estimated).  Normal diastolic function (however could have exertional diastolic dysfunction).  Will need better BP control      Relevant Orders   Cardiopulmonary exercise test   Essential hypertension - Primary (Chronic)    Blood pressure slightly higher  today after backing off of his atenolol-chlorthalidone.  I do agree with that decision though because of bradycardia.  Question chronotropic incompetence as part of the reason for his exertional dyspnea. Would probably need to add ARB -- or preferably calcium channel blocker since the cardiac suggest pulmonary hypertension.       Morbid obesity ([place])    Clearly this is a potential reason for dyspnea being deconditioned and obese.  Could also be related to pulmonary hypertension with potential of OSA/OHS. The patient understands the need to lose weight with diet and exercise. We have discussed specific strategies for this.       Orthopnea    Normal systolic function with relatively normal diastolic function.  He is on low-dose Lasix and seems to be better off. May be related to potential OSA.  Consider sleep study.      Weight gain (Chronic)    Needs in some part responsible for his dyspnea.  With decreased weight as another potential etiology, I would like to evaluate his dyspnea with CPX (Cardiopulmonary Stress Test) to distinguish between potentially cardiac versus pulmonary or simply deconditioning/obesity related dyspnea.         Current medicines are reviewed at length with the patient today.  (+/- concerns) n/a   Patient Instructions  Medication Instructions:   NO CHANGE  Testing/Procedures:  Your physician has recommended that you have a cardiopulmonary stress test (CPX). CPX testing is a non-invasive measurement of heart and lung function. It replaces a traditional treadmill stress test. This type of test provides a tremendous amount of information that relates not only to your present condition but also for future outcomes. This test combines measurements of you ventilation, respiratory gas exchange in the lungs, electrocardiogram (EKG), blood pressure and physical response before, during, and following an exercise protocol.    Follow-Up:  Your physician recommends  that you schedule a follow-up appointment in: 2 [place]   If you need a refill on your cardiac medications before your next appointment, please call your pharmacy.       Studies Ordered:   Orders Placed This Encounter  Procedures  . Cardiopulmonary exercise test      Glenetta Hew, M.D., M.S. Interventional Cardiologist   Pager # [phone] Phone # [phone] [address] [place], [place] [address]   Thank you for choosing Heartcare at [facility]!!

## 2017-11-09 NOTE — Assessment & Plan Note (Signed)
A 53 on atenolol.  This is a reduced dose and still bradycardic.  Question possibility of chronotropic incompetence.  We can evaluate this with Cardiaopulmonary Exercise Stress Test(CPX).'  Will probably need to further reduce beta-blocker dose and titrate up calcium channel blocker versus ARB.

## 2017-11-09 NOTE — Assessment & Plan Note (Signed)
Needs in some part responsible for his dyspnea.  With decreased weight as another potential etiology, I would like to evaluate his dyspnea with CPX (Cardiopulmonary Stress Test) to distinguish between potentially cardiac versus pulmonary or simply deconditioning/obesity related dyspnea.

## 2017-11-09 NOTE — Assessment & Plan Note (Signed)
Still with progressively worsening dyspnea, but coronary CT angiogram which excludes obstructive CAD.  Relatively normal echocardiogram besides what sounds like mild to moderate pulmonary hypertension (often over estimated).  Normal diastolic function (however could have exertional diastolic dysfunction).  Will need better BP control

## 2017-11-09 NOTE — Assessment & Plan Note (Signed)
Clearly this is a potential reason for dyspnea being deconditioned and obese.  Could also be related to pulmonary hypertension with potential of OSA/OHS. The patient understands the need to lose weight with diet and exercise. We have discussed specific strategies for this.

## 2017-11-09 NOTE — Assessment & Plan Note (Signed)
Noted on Echo - PAP peak ~50-55mmHg --> consider adding Calcium channel Blocker for additional BP control (Nifedipine or Amlodipine).

## 2017-11-09 NOTE — Assessment & Plan Note (Signed)
Normal systolic function with relatively normal diastolic function.  He is on low-dose Lasix and seems to be better off. May be related to potential OSA.  Consider sleep study.

## 2017-11-09 NOTE — Assessment & Plan Note (Signed)
Blood pressure slightly higher today after backing off of his atenolol-chlorthalidone.  I do agree with that decision though because of bradycardia.  Question chronotropic incompetence as part of the reason for his exertional dyspnea. Would probably need to add ARB -- or preferably calcium channel blocker since the cardiac suggest pulmonary hypertension.

## 2017-11-09 NOTE — Assessment & Plan Note (Signed)
Chest discomfort and exertional dyspnea, however known normal coronary CT angiogram would argue against this being Ischemia related. Could potentially be related to elevated pulmonary pressures however less likely.  Probably not anginal pain.

## 2017-11-22 ENCOUNTER — Encounter: Payer: Self-pay | Admitting: Pulmonary Disease

## 2017-11-22 ENCOUNTER — Ambulatory Visit (INDEPENDENT_AMBULATORY_CARE_PROVIDER_SITE_OTHER): Payer: BLUE CROSS/BLUE SHIELD | Admitting: Pulmonary Disease

## 2017-11-22 VITALS — BP 112/72 | HR 47 | Ht 71.0 in | Wt 319.0 lb

## 2017-11-22 DIAGNOSIS — G4733 Obstructive sleep apnea (adult) (pediatric): Secondary | ICD-10-CM | POA: Diagnosis not present

## 2017-11-22 DIAGNOSIS — R0602 Shortness of breath: Secondary | ICD-10-CM

## 2017-11-22 NOTE — Progress Notes (Signed)
Jonathan [name]    [id number]    05-25-1955  Primary Care Physician:Nafziger, Tommi Rumps, NP  Referring Physician: Dorothyann Peng, NP [place], [place] [address]  Chief complaint:   Shortness of breath  HPI:  Progressive shortness of breath over the last couple years Has gained a lot of weight recently Spouse was present during the interview and she notes snoring, gasping respirations, witnessed apneas, more difficulty when laying flat Loud breathing even when taking a nap during the day Has gained a lot of weight since retiring from the [employer] Denies a headache, he does have a dry throat of the mornings History of prostate cancer Orthopnea  Occupation: Worked for the postal service Exposures: No exposure to mold Smoking history: Non-smoker  Outpatient Encounter Medications as of 11/22/2017  Medication Sig  . atenolol-chlorthalidone (TENORETIC 50) 50-25 MG tablet Take 0.5 tablets by mouth daily.   No facility-administered encounter medications on file as of 11/22/2017.     Allergies as of 11/22/2017 - Review Complete 11/22/2017  Allergen Reaction Noted  . Codeine    . Cozaar [losartan potassium] Hives 09/08/2011    Past Medical History:  Diagnosis Date  . Anxiety   . Arthritis   . Asthma   . BPH (benign prostatic hyperplasia)   . ED (erectile dysfunction)   . Elevated PSA    9.33  . Esophageal reflux   . Hypertension   . Prostate cancer [facility]) Jan 2016   prostatectomy    Past Surgical History:  Procedure Laterality Date  . CORONARY CALCIUM SCORE / CARDIAC CT ANGIOGRAM  10/24/2017   Cor Ca Score = 0 - Low risk. Cor CTA: only mild plaque noted in mRCA with mLAD intramyocardial bridging noted.  [name] FOOT SURGERY     bilateral foot  . NM MYOVIEW LTD  07/2007   Normal nuclear stress test.  Mild fixed defect in the anterior wall likely related to soft tissue attenuation.  Normal EF.  Fair exercise capacity.  [name] PROSTATE BIOPSY  06/13/2012   right  lat mid gleason 3+3=6  . PROSTATECTOMY  2016  . TOTAL HIP ARTHROPLASTY  2008 &2007   Right and left    Family History  Problem Relation Age of Onset  . Hypertension Mother   . Colon cancer Mother   . Prostate cancer Father 73       alive now age 1  . Asthma Father   . Heart disease Father        Pt is not sure of details (no MI)  . Lung cancer Maternal Grandmother        non-smoker  . Tuberculosis Brother     Social History   Socioeconomic History  . Marital status: Married    Spouse name: Not on file  . Number of children: 2  . Years of education: Not on file  . Highest education level: Not on file  Occupational History  . Occupation: Education administrator: [employer]  Social Needs  . Financial resource strain: Not on file  . Food insecurity:    Worry: Not on file    Inability: Not on file  . Transportation needs:    Medical: Not on file    Non-medical: Not on file  Tobacco Use  . Smoking status: Never Smoker  . Smokeless tobacco: Never Used  Substance and Sexual Activity  . Alcohol use: No  . Drug use: No  . Sexual activity: Not  on file  Lifestyle  . Physical activity:    Days per week: Not on file    Minutes per session: Not on file  . Stress: Not on file  Relationships  . Social connections:    Talks on phone: Not on file    Gets together: Not on file    Attends religious service: Not on file    Active member of club or organization: Not on file    Attends meetings of clubs or organizations: Not on file    Relationship status: Not on file  . Intimate partner violence:    Fear of current or ex partner: Not on file    Emotionally abused: Not on file    Physically abused: Not on file    Forced sexual activity: Not on file  Other Topics Concern  . Not on file  Social History Narrative  . Not on file    Review of systems: Review of Systems  Constitutional: Negative for fever and chills.  HENT: Negative.   Eyes: Negative for blurred vision.    Respiratory: Shortness of breath with mild exertion Cardiovascular: orthopnea, bradycardia Gastrointestinal: Negative for vomiting, diarrhea, blood per rectum. Genitourinary: Negative for dysuria, urgency, frequency and hematuria.  Musculoskeletal: Negative for myalgias, back pain and joint pain.  Skin: Negative for itching and rash.  Neurological: Negative for dizziness, tremors, focal weakness, seizures and loss of consciousness.  Endo/Heme/Allergies: Negative for environmental allergies.  Psychiatric/Behavioral: Negative for depression, suicidal ideas and hallucinations.  All other systems reviewed and are negative.  Physical Exam:  Vitals:   11/22/17 1122  BP: 112/72  Pulse: (!) 47  SpO2: 93%   Gen:      No acute distress HEENT:  EOMI, sclera anicteric, Mallampati 3 Neck:     No masses; no thyromegaly, neck is thick Lungs:    Clear to auscultation bilaterally; normal respiratory effort CV:         Regular rate and rhythm; no murmurs Abd:      + bowel sounds; soft, non-tender; no palpable masses, no distension Ext:    No edema; adequate peripheral perfusion Skin:      Warm and dry; no rash Neuro: alert and oriented x 3 Psych: normal mood and affect  Data Reviewed: [name]  Recent CT, coronary CT noted  .  Reviewed   Assessment:  .  High probability sleep disordered breathing  .  obstructive sleep apnea  .  Possible obesity hypoventilation  .  Morbid obesity  .  Pulmonary hypertension  Plan/Recommendations:  .  Split-night study  .  Pulmonary function test  .  Aggressive weight loss measures  .  Regular exercises  I will see him after his sleep study, will follow up on the PFT  Pathophysiology of sleep disordered breathing discussed, Importance of weight loss discussed    Sherrilyn Rist MD [facility] 11/22/2017, 11:52 AM  CC: Dorothyann Peng, NP

## 2017-11-22 NOTE — Patient Instructions (Signed)
Shortness of breath  Pulmonary hypertension  Morbid obesity  Possible obesity hypoventilation  Obtain a split-night study  Pulmonary function study  Graded exercises  We will follow-up with a cardiopulmonary exercise study   We will see you in the office in 6 to 8 weeks

## 2017-12-07 ENCOUNTER — Other Ambulatory Visit (HOSPITAL_COMMUNITY): Payer: Self-pay | Admitting: *Deleted

## 2017-12-07 ENCOUNTER — Ambulatory Visit (HOSPITAL_COMMUNITY): Payer: BLUE CROSS/BLUE SHIELD | Attending: Cardiology

## 2017-12-07 DIAGNOSIS — R0609 Other forms of dyspnea: Secondary | ICD-10-CM | POA: Insufficient documentation

## 2017-12-09 ENCOUNTER — Other Ambulatory Visit: Payer: Self-pay | Admitting: Cardiology

## 2017-12-26 ENCOUNTER — Ambulatory Visit (HOSPITAL_BASED_OUTPATIENT_CLINIC_OR_DEPARTMENT_OTHER): Payer: BLUE CROSS/BLUE SHIELD | Attending: Pulmonary Disease | Admitting: Pulmonary Disease

## 2017-12-26 VITALS — Ht 71.0 in | Wt 315.0 lb

## 2017-12-26 DIAGNOSIS — R0602 Shortness of breath: Secondary | ICD-10-CM

## 2017-12-26 DIAGNOSIS — G4709 Other insomnia: Secondary | ICD-10-CM | POA: Insufficient documentation

## 2017-12-26 DIAGNOSIS — Z6841 Body Mass Index (BMI) 40.0 and over, adult: Secondary | ICD-10-CM | POA: Insufficient documentation

## 2017-12-26 DIAGNOSIS — G4733 Obstructive sleep apnea (adult) (pediatric): Secondary | ICD-10-CM | POA: Diagnosis not present

## 2017-12-26 DIAGNOSIS — E669 Obesity, unspecified: Secondary | ICD-10-CM | POA: Insufficient documentation

## 2018-01-03 ENCOUNTER — Telehealth: Payer: Self-pay | Admitting: Pulmonary Disease

## 2018-01-03 DIAGNOSIS — G4733 Obstructive sleep apnea (adult) (pediatric): Secondary | ICD-10-CM

## 2018-01-03 NOTE — Telephone Encounter (Signed)
Moderate obstructive sleep apnea Sleep onset and sleep maintenance insomnia  DME referral Early follow-up in the office-may be made even before CPAP initiation to discuss patient's insomnia  - Auto titrating CPAP with a pressure setting of 5 -15 with a Medium size Resmed Full Face Mask AirFit F20 mask and heated humidification.

## 2018-01-03 NOTE — Telephone Encounter (Signed)
Patient returned call, CB is [phone]

## 2018-01-03 NOTE — Telephone Encounter (Signed)
LVM to return call to clinic.

## 2018-01-03 NOTE — Telephone Encounter (Signed)
Called patient unable to reach left message to give us a call back.

## 2018-01-03 NOTE — Procedures (Signed)
POLYSOMNOGRAPHY  Last, First: Jonathan [name], Jonathan [name] MRN: [id number] Gender: Male Age (years): 62 Weight (lbs): 315 DOB: 26-Mar-1956 BMI: 44 Primary Care: No PCP Epworth Score: 11 Referring: Laurin Coder MD Technician: Zadie Rhine Interpreting: Laurin Coder MD Study Type: Split Night CPAP Ordered Study Type: Split Night CPAP Study date: 12/26/2017 Location: [facility] CLINICAL INFORMATION Jonathan [name] is a 62 year old Male and was referred to the sleep center for evaluation of G47.30 Sleep Apnea, Unspecified (780.57). Indications include Obesity, OSA, Snoring, Witnessed Apneas.  MEDICATIONS Patient self administered medications include: N/A. Medications administered during study include No sleep medicine administered.  SLEEP STUDY TECHNIQUE The patient underwent an attended overnight level one polysomnography titration to assess the effects of CPAP therapy. The following variables were monitored: EEG (C4-A1, C3-A2, O1-A2, O2-A1), EOG, submental and leg EMG, ECG, oxyhemoglobin saturation by pulse oximetry, thoracic and abdominal respiratory effort belts, nasal/oral airflow by pressure sensor, body position sensor and snoring sensor. CPAP pressure was titrated to eliminate apneas, hypopneas and oxygen desaturation. Hypopneas were scored per AASM definition IB (4% desaturation)  The NPSG portion of the study ended at 2:00:29 AM . The CPAP titration was initiated at 2:08:42 AM AM with the CPAP portion of the study ending at 4:35:59 AM.  TECHNICIAN COMMENTS Comments added by Technician: TWO RESTROOM VISTED. O2 initiated due to low sats. Patient had difficulty initiating sleep. Patient was restless all through the night. Comments added by Scorer: N/A SLEEP ARCHITECTURE The recording time for the entire night was 381.8 minutes. The diagnostic portion was initiated at 10:14:11 PM and terminated at 2:00:29 AM. The time in bed was 226.3 minutes. EEG confirmed total sleep time was  126.4 minutes yielding a sleep efficiency of 55.9%%. Sleep onset after lights out was 61.9 minutes with a REM latency of 68.5 minutes. The patient spent 7.9%% of the night in stage N1 sleep, 83.4%% in stage N2 sleep, 0.0%% in stage N3 and 8.7% in REM. The Arousal Index was 29.0/hour.  The titration portion was initiated at 2:08:42 AM and terminated at 4:35:59 AM. The time in bed was 147.3 minutes. EEG confirmed total sleep time was 40.5 minutes yielding a sleep efficiency of 27.5%%. Sleep onset after CPAP initiation was 100.9 minutes with a REM latency of N/A minutes. The patient spent 11.1%% of the night in stage N1 sleep, 88.9%% in stage N2 sleep, 0.0%% in stage N3 and 0% in REM. The Arousal Index was 23.7/hour. RESPIRATORY PARAMETERS During the diagnostic portion, there were a total of 55 respiratory disturbances recorded; 10 apneas ( 10 obstructive, 0 mixed, 0 central), 24 hypopneas and 21 RERAs. The apnea/hypopnea index 16.1 was events/hour and the RDI was 26.1 events/hour. The central sleep apnea index was 0.0 events/hour. The REM AHI was 43.6 /h and NREM AHI was 13.5/h. The REM RDI was 60.0 /h and NREM RDI was 22.9 /h. The supine AHI was N/A/h, and the non supine AHI was 16.1/h; supine during 0.0%% of sleep. The supine RDI was 0.0/h, and the non supine RDI was 26.11/h. Respiratory disturbances were associated with oxygen desaturation down to a nadir of 68.0 % during sleep. The mean oxygen saturation during the study was 89.4%. The cumulative time under 88% oxygen saturation was 63.6 minutes.  During the titration portion, the apnea/hypopnea index (AHI) was 7.4 events/hour and the RDI was 16.3 events/hour. The central sleep apnea index was events/hour. The most appropriate setting of CPAP was IPAP/EPAP 9/9 cm H2O. At this setting, the sleep efficiency was 93%  and the patient was supine for 0%. The AHI was 0.0 events per hour(with 0 central events). Oxygen nadir was 88.0. LEG MOVEMENT DATA The periodic  limb movement index was 0.0/hour with an associated arousal index of /hour. CARDIAC DATA The underlying cardiac rhythm was most consistent with sinus rhythm. Mean heart rate was 54.9 during diagnostic portion and 49.9 during titration portion of study. Additional rhythm abnormalities include None.   IMPRESSIONS - Moderate Obstructive Sleep apnea(OSA) Optimal pressure attained, however, poor sleep efficiency. - EKG showed no cardiac abnormalities. - No Significant Central Sleep Apnea (CSA) - Severe Oxygen Desaturation - No snoring was audible during this study. - EEG did not show alpha intrusion. - No significant periodic leg movements(PLMs) during sleep.  Jonathan [name] reduced sleep efficiency, long primary sleep latency, short REM sleep latency and no slow wave latency.   DIAGNOSIS - Obstructive Sleep Apnea (327.23 [G47.33 ICD-10]) - Insomnia, sleep onset and sleep maintenance insomnia   RECOMMENDATIONS - Trial of CPAP therapy, CPAP of 9 was trialed during the study. However, study was limited by poor sleep efficiency. Optimal pressures may not have been attained. - Auto titrating CPAP with a pressure setting of 5 -15 with a Medium size Resmed Full Face Mask AirFit F20 mask and heated humidification. - Avoid alcohol, sedatives and other CNS depressants that may worsen sleep apnea and disrupt normal sleep architecture. - Sleep hygiene should be reviewed to assess factors that may improve sleep quality. - Close clinical follow-up with management of contributors the patient's insomnia, compliance data monitoring. - Weight management and regular exercise should be initiated or continued. - Return to Sleep Center for re-evaluation after 4 weeks of therapy  [Electronically signed] 01/03/2018 05:09 AM  Sherrilyn Rist MD NPI: [id number]

## 2018-01-04 NOTE — Telephone Encounter (Signed)
Patient is aware of these results orders placed nothing further needed at this time.

## 2018-01-09 ENCOUNTER — Encounter: Payer: Self-pay | Admitting: Cardiology

## 2018-01-09 ENCOUNTER — Ambulatory Visit: Payer: BLUE CROSS/BLUE SHIELD | Admitting: Cardiology

## 2018-01-09 DIAGNOSIS — I10 Essential (primary) hypertension: Secondary | ICD-10-CM

## 2018-01-09 DIAGNOSIS — I272 Pulmonary hypertension, unspecified: Secondary | ICD-10-CM

## 2018-01-09 DIAGNOSIS — R0609 Other forms of dyspnea: Secondary | ICD-10-CM | POA: Diagnosis not present

## 2018-01-09 NOTE — Progress Notes (Signed)
PCP: Dorothyann Peng, NP  Clinic Note: Chief Complaint  Patient presents with  . Follow-up    Exertional dyspnea.  CPX results    HPI: Jonathan [name] is a 62 y.o. male who is being seen today for follow-up evaluation of shortness of breath mostly with exertion and lower extremity swelling.   PMH is notable for Hypertension, prostate cancer, BPH GERD and arthritis, anxiety.  Nonischemic Myoview in April 2009  Jonathan [name] was seen back on July 24 where he noted continued exertional dyspnea--> ordered CPX.  Recent Hospitalizations: None  Studies Personally Reviewed - (if available, images/films reviewed: From Epic Chart or Care Everywhere)  CPX Sept 2019: Mild- Moderate functional limitation - due primarily to weight & deconditioning. No evidence of clear Cardiac Limitation - but Hypertensive response to exercise would suggest Diastolic Dysfunction -- may need to increase BP control. No Chronotropic Incompetence  2D Echo June 2019: Normal function EF 55 to 60%.  ?? Normal Diastolic Fxn. No RWMA. Mod Pulm HTN - 59 mmHg.   Interval History: Jonathan [name] presents here to discuss results of his CPX but also hypertension. Really his exertional dyspnea seems to have improved --he does not have any chest tightness pressure with rest or exertion.  He had no evidence of fatigue and did relatively well and CPX as far as getting his heart rate up indicating that there is no evidence of chronotropic incompetence. He does say that his blood pressure has gone up a little bit since backing off his blood pressure medication, but usually his blood pressures have been anywhere from 423-536 mmHg systolic.  Despite this, his CPX revealed hypertensive response to exercise.  This would mean better blood pressure control is necessary. He is pending pulmonary evaluation for OSA evaluation and to consider initiating CPAP.  He there was evidence of pulmonary pretension on 2D echo.  He denies any PND, orthopnea  and only has minimal edema. No rapid irregular heartbeats palpitations.  No syncope/near syncope or TIA/amaurosis fugax symptoms..  No claudication  ROS: A comprehensive was performed.  Pertinent symptoms in HPI. Review of Systems  Constitutional: Negative for malaise/fatigue and weight loss (Weight gain / stable).  HENT: Negative for congestion and nosebleeds.   Respiratory: Positive for shortness of breath (But notably improved). Negative for cough.   Cardiovascular:       Per HPI  Gastrointestinal: Negative for blood in stool and constipation.  Genitourinary: Negative for hematuria.  Musculoskeletal: Negative for joint pain.  Neurological: Positive for dizziness (only with exertional dyspnea).  Psychiatric/Behavioral: Negative.   All other systems reviewed and are negative.  I have reviewed and (if needed) personally updated the patient's problem list, medications, allergies, past medical and surgical history, social and family history.   Past Medical History:  Diagnosis Date  . Anxiety   . Arthritis   . Asthma   . BPH (benign prostatic hyperplasia)   . ED (erectile dysfunction)   . Elevated PSA    9.33  . Esophageal reflux   . Hypertension   . Prostate cancer [facility]) Jan 2016   prostatectomy    Past Surgical History:  Procedure Laterality Date  . CORONARY CALCIUM SCORE / CARDIAC CT ANGIOGRAM  10/24/2017   Cor Ca Score = 0 - Low risk. Cor CTA: only mild plaque noted in mRCA with mLAD intramyocardial bridging noted.  [name] FOOT SURGERY     bilateral foot  . NM MYOVIEW LTD  07/2007   Normal nuclear stress test.  Mild  fixed defect in the anterior wall likely related to soft tissue attenuation.  Normal EF.  Fair exercise capacity.  [name] PROSTATE BIOPSY  06/13/2012   right lat mid gleason 3+3=6  . PROSTATECTOMY  2016  . TOTAL HIP ARTHROPLASTY  2008 &2007   Right and left    CORONARY CALCIUM SCORE & CARDIAC CT ANGIOGRAM  Cor Ca Score = 0 - Low risk.   Cor CTA: only mild plaque  noted in mRCA with mLAD intramyocardial bridging noted.  Current Meds  Medication Sig  . atenolol-chlorthalidone (TENORETIC 50) 50-25 MG tablet Take 0.5 tablets by mouth daily.    Allergies  Allergen Reactions  . Codeine     REACTION: emesis  . Cozaar [Losartan Potassium] Hives    Social History   Tobacco Use  . Smoking status: Never Smoker  . Smokeless tobacco: Never Used  Substance Use Topics  . Alcohol use: No  . Drug use: No   Social History   Social History Narrative  . Not on file   Family History family history includes Asthma in his father; Colon cancer in his mother; Heart disease in his father; Hypertension in his mother; Lung cancer in his maternal grandmother; Prostate cancer (age of onset: 18) in his father; Tuberculosis in his brother.  Wt Readings from Last 3 Encounters:  01/09/18 (!) 319 lb 12.8 oz (145.1 kg)  12/26/17 (!) 315 lb (142.9 kg)  11/22/17 (!) 319 lb (144.7 kg)    PHYSICAL EXAM BP (!) 172/79   Pulse (!) 53   Ht 5\' 11"  (1.803 m)   Wt (!) 319 lb 12.8 oz (145.1 kg)   BMI 44.60 kg/m  Physical Exam  Constitutional: He is oriented to person, place, and time. He appears well-developed and well-nourished. No distress.  Morbidly obese.  Well-groomed.  HENT:  Head: Normocephalic and atraumatic.  Neck: Normal range of motion. Neck supple. No JVD present. Carotid bruit is not present.  Cardiovascular: Regular rhythm, S1 normal, S2 normal and intact distal pulses.  No extrasystoles are present. Bradycardia present. PMI is not displaced. Exam reveals distant heart sounds. Exam reveals no gallop and no friction rub.  No murmur heard. Pulmonary/Chest: Effort normal and breath sounds normal. No respiratory distress. He has no wheezes. He has no rales. He exhibits no tenderness.  Musculoskeletal: Normal range of motion. He exhibits no edema (Trivial).  Neurological: He is alert and oriented to person, place, and time.  Psychiatric: He has a normal mood  and affect. His behavior is normal. Judgment and thought content normal.  Vitals reviewed.   Adult ECG Report N/a  Other studies Reviewed: Additional studies/ records that were reviewed today include:  Recent Labs:    Lipids followed by PCP -> no labs available  ASSESSMENT / PLAN: Problem List Items Addressed This Visit    Chronic pulmonary hypertension ([place]) - noted on Echo (Chronic)    Mild to moderate pulmonary pretension by echo.  This tends to overestimate with his measured. With him having hypertension, I would strongly recommend adding calcium channel blocker (probably dihydropyridine type --amlodipine or nifedipine with resting bradycardia)  This should improve with initiation of treatment for OSA with CPAP.      DOE (dyspnea on exertion) (Chronic)    At this point, he has had a pretty extensive cardiac evaluation with no significant CAD on coronary CTA, no cardiac mutations by CPX and relatively normal echocardiogram with extension of some mild to moderate pulmonary hypertension.  Recommend continued blood pressure control  with hypertensive response to exercise along with weight loss and CPAP.      Essential hypertension (Chronic)    Blood pressure was quite high today.  Better on my recheck (more like 150/70 mmHg). He tells me at home his blood pressure is better.  I recommend that he follow-up his blood pressures at home and discuss with his PCP.  I do think you probably would benefit being on amlodipine or nifedipine. Will defer judgment to PCP, but calcium channel blocker would be appropriate given his pulmonary hypertension.      Morbid obesity ([place]) - Primary (Chronic)    I actually spent about 5 or 6 minutes discussing dietary habits including suggestions of DASH or Mediterranean-style diets as well as just simply reducing portion size.  He says that his wife likes to cook lots of food, and gets upset if he does not eat.  I suggested that she simply cooks less meals  and has 1 days with the cooking provide food for 2 days of eating.  If they automatically put one half of the food in the refrigerator, then he does not have such large amounts to serve himself.  We talked about reducing serving size. We also talked about making sure that he stays active and exercises.         ~30 minutes spent with the patient and chart,  >>  50% of the time was spent in direct patient counseling. Most of the time was spent discussing diet changes and what it means to go forward with evaluating for sleep apnea and CPAP.  He asked about weight loss surgeries, that is up and he would be willing to look into.  I discussed that process as well. Current medicines are reviewed at length with the patient today.  (+/- concerns) n/a   Patient Instructions  NO CHANGES WITH MEDICATIONS    Your physician recommends that you schedule a follow-up appointment ON AN AS NEEDED BASIS.     Studies Ordered:   No orders of the defined types were placed in this encounter.     Glenetta Hew, M.D., M.S. Interventional Cardiologist   Pager # [phone] Phone # [phone] [address] [place], [place] [address]   Thank you for choosing [facility]!!

## 2018-01-09 NOTE — Patient Instructions (Signed)
NO CHANGES WITH MEDICATIONS    Your physician recommends that you schedule a follow-up appointment ON AN AS NEEDED BASIS.

## 2018-01-10 ENCOUNTER — Encounter: Payer: Self-pay | Admitting: Cardiology

## 2018-01-10 NOTE — Assessment & Plan Note (Signed)
Blood pressure was quite high today.  Better on my recheck (more like 150/70 mmHg). He tells me at home his blood pressure is better.  I recommend that he follow-up his blood pressures at home and discuss with his PCP.  I do think you probably would benefit being on amlodipine or nifedipine. Will defer judgment to PCP, but calcium channel blocker would be appropriate given his pulmonary hypertension.

## 2018-01-10 NOTE — Assessment & Plan Note (Addendum)
Mild to moderate pulmonary pretension by echo.  This tends to overestimate with his measured. With him having hypertension, I would strongly recommend adding calcium channel blocker (probably dihydropyridine type --amlodipine or nifedipine with resting bradycardia)  This should improve with initiation of treatment for OSA with CPAP.

## 2018-01-10 NOTE — Assessment & Plan Note (Signed)
At this point, he has had a pretty extensive cardiac evaluation with no significant CAD on coronary CTA, no cardiac mutations by CPX and relatively normal echocardiogram with extension of some mild to moderate pulmonary hypertension.  Recommend continued blood pressure control with hypertensive response to exercise along with weight loss and CPAP.

## 2018-01-10 NOTE — Assessment & Plan Note (Signed)
I actually spent about 5 or 6 minutes discussing dietary habits including suggestions of DASH or Mediterranean-style diets as well as just simply reducing portion size.  He says that his wife likes to cook lots of food, and gets upset if he does not eat.  I suggested that she simply cooks less meals and has 1 days with the cooking provide food for 2 days of eating.  If they automatically put one half of the food in the refrigerator, then he does not have such large amounts to serve himself.  We talked about reducing serving size. We also talked about making sure that he stays active and exercises.

## 2018-01-11 ENCOUNTER — Ambulatory Visit: Payer: BLUE CROSS/BLUE SHIELD | Admitting: Adult Health

## 2018-01-11 ENCOUNTER — Encounter: Payer: Self-pay | Admitting: Adult Health

## 2018-01-11 VITALS — BP 140/90 | HR 60 | Temp 98.4°F | Ht 71.0 in | Wt 317.4 lb

## 2018-01-11 DIAGNOSIS — I10 Essential (primary) hypertension: Secondary | ICD-10-CM | POA: Diagnosis not present

## 2018-01-11 MED ORDER — AMLODIPINE BESYLATE 5 MG PO TABS: 5.0000 mg | ORAL_TABLET | Freq: Every day | ORAL | 0 refills | Status: DC

## 2018-01-11 NOTE — Progress Notes (Signed)
Subjective:    Patient ID: Jonathan [name], male    DOB: Sep 21, 1955, 62 y.o.   MRN: [id number]  HPI 62 year old male who  has a past medical history of Anxiety, Arthritis, Asthma, BPH (benign prostatic hyperplasia), ED (erectile dysfunction), Elevated PSA, Esophageal reflux, Hypertension, and Prostate cancer [facility]) (Jan 2016).  He presents to the office today for follow up of hypertension. He was seen by Cardiology yesterday who recommended he follow up with his PCP for blood pressure control. In the cardiology office BP 172/79 and 150/70 on recheck. .Cardiology recommended Norvasc or nifedipine d/t pulmonary hypertension.   His BP at home last night was in the 170/100's.   He would like to be seen at the weight loss clinic to help him lose weight   BP Readings from Last 3 Encounters:  01/11/18 140/90  01/09/18 (!) 172/79  11/22/17 112/72   Wt Readings from Last 3 Encounters:  01/11/18 (!) 317 lb 6.4 oz (144 kg)  01/09/18 (!) 319 lb 12.8 oz (145.1 kg)  12/26/17 (!) 315 lb (142.9 kg)    Review of Systems See HPI   Past Medical History:  Diagnosis Date  . Anxiety   . Arthritis   . Asthma   . BPH (benign prostatic hyperplasia)   . ED (erectile dysfunction)   . Elevated PSA    9.33  . Esophageal reflux   . Hypertension   . Prostate cancer [facility] [place]) Jan 2016   prostatectomy    Social History   Socioeconomic History  . Marital status: Married    Spouse name: Not on file  . Number of children: 2  . Years of education: Not on file  . Highest education level: Not on file  Occupational History  . Occupation: Education administrator: [employer]  Social Needs  . Financial resource strain: Not on file  . Food insecurity:    Worry: Not on file    Inability: Not on file  . Transportation needs:    Medical: Not on file    Non-medical: Not on file  Tobacco Use  . Smoking status: Never Smoker  . Smokeless tobacco: Never Used  Substance and Sexual Activity  . Alcohol use: No  . Drug  use: No  . Sexual activity: Not on file  Lifestyle  . Physical activity:    Days per week: Not on file    Minutes per session: Not on file  . Stress: Not on file  Relationships  . Social connections:    Talks on phone: Not on file    Gets together: Not on file    Attends religious service: Not on file    Active member of club or organization: Not on file    Attends meetings of clubs or organizations: Not on file    Relationship status: Not on file  . Intimate partner violence:    Fear of current or ex partner: Not on file    Emotionally abused: Not on file    Physically abused: Not on file    Forced sexual activity: Not on file  Other Topics Concern  . Not on file  Social History Narrative  . Not on file    Past Surgical History:  Procedure Laterality Date  . CORONARY CALCIUM SCORE / CARDIAC CT ANGIOGRAM  10/24/2017   Cor Ca Score = 0 - Low risk. Cor CTA: only mild plaque noted in mRCA with mLAD intramyocardial bridging noted.  [name] FOOT SURGERY  bilateral foot  . NM MYOVIEW LTD  07/2007   Normal nuclear stress test.  Mild fixed defect in the anterior wall likely related to soft tissue attenuation.  Normal EF.  Fair exercise capacity.  [name] PROSTATE BIOPSY  06/13/2012   right lat mid gleason 3+3=6  . PROSTATECTOMY  2016  . TOTAL HIP ARTHROPLASTY  2008 &2007   Right and left    Family History  Problem Relation Age of Onset  . Hypertension Mother   . Colon cancer Mother   . Prostate cancer Father 53       alive now age 43  . Asthma Father   . Heart disease Father        Pt is not sure of details (no MI)  . Lung cancer Maternal Grandmother        non-smoker  . Tuberculosis Brother     Allergies  Allergen Reactions  . Codeine     REACTION: emesis  . Cozaar [Losartan Potassium] Hives    Current Outpatient Medications on File Prior to Visit  Medication Sig Dispense Refill  . atenolol-chlorthalidone (TENORETIC 50) 50-25 MG tablet Take 0.5 tablets by mouth daily.  (Patient taking differently: Take 1 tablet by mouth daily. ) 45 tablet 3   No current facility-administered medications on file prior to visit.     BP 140/90 (BP Location: Left Arm, Patient Position: Sitting, Cuff Size: Large)   Pulse 60   Temp 98.4 F (36.9 C) (Oral)   Ht 5\' 11"  (1.803 m)   Wt (!) 317 lb 6.4 oz (144 kg)   BMI 44.27 kg/m       Objective:   Physical Exam  Constitutional: He is oriented to person, place, and time. He appears well-developed and well-nourished. No distress.  Obese   Neck: Normal range of motion. Neck supple.  Cardiovascular: Normal rate, regular rhythm, normal heart sounds and intact distal pulses.  Pulmonary/Chest: Effort normal and breath sounds normal.  Neurological: He is alert and oriented to person, place, and time.  Skin: He is not diaphoretic.  Psychiatric: He has a normal mood and affect. His behavior is normal. Judgment and thought content normal.  Nursing note and vitals reviewed.     Assessment & Plan:  1. Essential hypertension  - amLODipine (NORVASC) 5 MG tablet; Take 1 tablet (5 mg total) by mouth daily.  Dispense: 30 tablet; Refill: 0 - Follow up in 10-14 days  - Bring log    2. Morbid obesity ([place])  - Amb Ref to Medical Weight Management   Dorothyann Peng, NP

## 2018-01-17 ENCOUNTER — Ambulatory Visit: Payer: BLUE CROSS/BLUE SHIELD | Admitting: Pulmonary Disease

## 2018-01-24 ENCOUNTER — Other Ambulatory Visit: Payer: Self-pay | Admitting: Family Medicine

## 2018-01-25 ENCOUNTER — Ambulatory Visit: Payer: BLUE CROSS/BLUE SHIELD | Admitting: Adult Health

## 2018-01-25 ENCOUNTER — Encounter: Payer: Self-pay | Admitting: Adult Health

## 2018-01-25 VITALS — BP 126/80 | Temp 98.1°F | Wt 316.0 lb

## 2018-01-25 DIAGNOSIS — I10 Essential (primary) hypertension: Secondary | ICD-10-CM | POA: Diagnosis not present

## 2018-01-25 NOTE — Progress Notes (Signed)
Subjective:    Patient ID: Jonathan [name], male    DOB: [birth year], 62 y.o.   MRN: [id number]  HPI 62 year old male who  has a past medical history of Anxiety, Arthritis, Asthma, BPH (benign prostatic hyperplasia), ED (erectile dysfunction), Elevated PSA, Esophageal reflux, Hypertension, and Prostate cancer [facility]) (Jan 2016).  He presents to the office today for follow up regarding hypertension.   During his last visit Norvasc 5 mg was added to his regimen of Tenoretic 50-25 mg. Since starting Norvasc he reports no episodes of dizziness, lightheadedness, or blurred vision.   He has been monitoring his BP at home and has readings between 116-130/80's consistently.   BP Readings from Last 3 Encounters:  01/25/18 126/80  01/11/18 140/90  01/09/18 (!) 172/79    Review of Systems See HPI   Past Medical History:  Diagnosis Date  . Anxiety   . Arthritis   . Asthma   . BPH (benign prostatic hyperplasia)   . ED (erectile dysfunction)   . Elevated PSA    9.33  . Esophageal reflux   . Hypertension   . Prostate cancer [facility]) Jan 2016   prostatectomy    Social History   Socioeconomic History  . Marital status: Married    Spouse name: Not on file  . Number of children: 2  . Years of education: Not on file  . Highest education level: Not on file  Occupational History  . Occupation: Education administrator: [employer]  Social Needs  . Financial resource strain: Not on file  . Food insecurity:    Worry: Not on file    Inability: Not on file  . Transportation needs:    Medical: Not on file    Non-medical: Not on file  Tobacco Use  . Smoking status: Never Smoker  . Smokeless tobacco: Never Used  Substance and Sexual Activity  . Alcohol use: No  . Drug use: No  . Sexual activity: Not on file  Lifestyle  . Physical activity:    Days per week: Not on file    Minutes per session: Not on file  . Stress: Not on file  Relationships  . Social connections:    Talks on phone: Not on  file    Gets together: Not on file    Attends religious service: Not on file    Active member of club or organization: Not on file    Attends meetings of clubs or organizations: Not on file    Relationship status: Not on file  . Intimate partner violence:    Fear of current or ex partner: Not on file    Emotionally abused: Not on file    Physically abused: Not on file    Forced sexual activity: Not on file  Other Topics Concern  . Not on file  Social History Narrative  . Not on file    Past Surgical History:  Procedure Laterality Date  . CORONARY CALCIUM SCORE / CARDIAC CT ANGIOGRAM  10/24/2017   Cor Ca Score = 0 - Low risk. Cor CTA: only mild plaque noted in mRCA with mLAD intramyocardial bridging noted.  [name] FOOT SURGERY     bilateral foot  . NM MYOVIEW LTD  07/2007   Normal nuclear stress test.  Mild fixed defect in the anterior wall likely related to soft tissue attenuation.  Normal EF.  Fair exercise capacity.  [name] PROSTATE BIOPSY  06/13/2012   right lat mid gleason 3+3=6  .  PROSTATECTOMY  2016  . TOTAL HIP ARTHROPLASTY  2008 &2007   Right and left    Family History  Problem Relation Age of Onset  . Hypertension Mother   . Colon cancer Mother   . Prostate cancer Father 58       alive now age 37  . Asthma Father   . Heart disease Father        Pt is not sure of details (no MI)  . Lung cancer Maternal Grandmother        non-smoker  . Tuberculosis Brother     Allergies  Allergen Reactions  . Codeine     REACTION: emesis  . Cozaar [Losartan Potassium] Hives    Current Outpatient Medications on File Prior to Visit  Medication Sig Dispense Refill  . amLODipine (NORVASC) 5 MG tablet Take 1 tablet (5 mg total) by mouth daily. 30 tablet 0  . atenolol-chlorthalidone (TENORETIC 50) 50-25 MG tablet Take 0.5 tablets by mouth daily. (Patient taking differently: Take 1 tablet by mouth daily. ) 45 tablet 3   No current facility-administered medications on file prior to visit.      BP 126/80   Temp 98.1 F (36.7 C) (Oral)   Wt (!) 316 lb (143.3 kg)   BMI 44.07 kg/m       Objective:   Physical Exam  Constitutional: He is oriented to person, place, and time. He appears well-developed and well-nourished. No distress.  Cardiovascular: Normal rate, regular rhythm, normal heart sounds and intact distal pulses.  Pulmonary/Chest: Effort normal and breath sounds normal.  Musculoskeletal: Normal range of motion. He exhibits no edema, tenderness or deformity.  Neurological: He is alert and oriented to person, place, and time.  Skin: Skin is warm and dry. Capillary refill takes less than 2 seconds. He is not diaphoretic.  Psychiatric: He has a normal mood and affect. His behavior is normal. Judgment and thought content normal.  Nursing note and vitals reviewed.     Assessment & Plan:  1. Essential hypertension - BP better controlled.  - No change in medications  - Follow up for CPE  - Encouraged weight loss through diet and exercise  Dorothyann Peng, NP

## 2018-02-02 ENCOUNTER — Other Ambulatory Visit: Payer: Self-pay | Admitting: Adult Health

## 2018-02-02 DIAGNOSIS — I10 Essential (primary) hypertension: Secondary | ICD-10-CM

## 2018-02-03 ENCOUNTER — Other Ambulatory Visit: Payer: Self-pay

## 2018-02-03 MED ORDER — ATENOLOL-CHLORTHALIDONE 50-25 MG PO TABS: 1.0000 | ORAL_TABLET | Freq: Every day | ORAL | 1 refills | Status: DC

## 2018-02-03 NOTE — Telephone Encounter (Signed)
Sent to the pharmacy by e-scribe.  Pt has cpx scheduled for 04/06/18.

## 2018-02-16 ENCOUNTER — Encounter: Payer: Self-pay | Admitting: Gastroenterology

## 2018-02-16 ENCOUNTER — Ambulatory Visit: Payer: BLUE CROSS/BLUE SHIELD | Admitting: Gastroenterology

## 2018-02-16 ENCOUNTER — Other Ambulatory Visit (INDEPENDENT_AMBULATORY_CARE_PROVIDER_SITE_OTHER): Payer: BLUE CROSS/BLUE SHIELD

## 2018-02-16 VITALS — BP 126/84 | HR 72 | Ht 71.0 in | Wt 318.1 lb

## 2018-02-16 DIAGNOSIS — R635 Abnormal weight gain: Secondary | ICD-10-CM

## 2018-02-16 DIAGNOSIS — R1084 Generalized abdominal pain: Secondary | ICD-10-CM | POA: Diagnosis not present

## 2018-02-16 DIAGNOSIS — R140 Abdominal distension (gaseous): Secondary | ICD-10-CM | POA: Diagnosis not present

## 2018-02-16 DIAGNOSIS — K581 Irritable bowel syndrome with constipation: Secondary | ICD-10-CM

## 2018-02-16 LAB — TSH: TSH: 1.63 u[IU]/mL (ref 0.35–4.50)

## 2018-02-16 LAB — CREATININE, SERUM: Creatinine, Ser: 1.07 mg/dL (ref 0.40–1.50)

## 2018-02-16 LAB — BUN: BUN: 14 mg/dL (ref 6–23)

## 2018-02-16 LAB — T4, FREE: Free T4: 0.91 ng/dL (ref 0.60–1.60)

## 2018-02-16 NOTE — Patient Instructions (Signed)
If you are age 61 or older, your body mass index should be between 23-30. Your Body mass index is 44.37 kg/m. If this is out of the aforementioned range listed, please consider follow up with your Primary Care Provider.  If you are age 64 or younger, your body mass index should be between 19-25. Your Body mass index is 44.37 kg/m. If this is out of the aformentioned range listed, please consider follow up with your Primary Care Provider.   Your provider has requested that you go to the basement level for lab work before leaving today. Press "B" on the elevator. The lab is located at the first door on the left as you exit the elevator.  Medication Samples have been provided to the patient.  Drug name: Motegrity        Strength: 2 mg         Qty: 7  LOT: [id number]  Exp.Date: 02-2019  Dosing instructions: one a day  The patient has been instructed regarding the correct time, dose, and frequency of taking this medication, including desired effects and most common side effects.   Elias Else 4:27 PM 02/16/2018      You have been scheduled for a CT scan of the abdomen and pelvis at [facility] ([address]---this is in the same building as Press photographer).   You are scheduled on 03-03-2018 at 230pm. You should arrive 15 minutes prior to your appointment time for registration. Please follow the written instructions below on the day of your exam:  WARNING: IF YOU ARE ALLERGIC TO IODINE/X-RAY DYE, PLEASE NOTIFY RADIOLOGY IMMEDIATELY AT [phone]! YOU WILL BE GIVEN A 13 HOUR PREMEDICATION PREP.  1) Do not eat or drink anything after 10:30am (4 hours prior to your test) 2) You have been given 2 bottles of oral contrast to drink. The solution may taste better if refrigerated, but do NOT add ice or any other liquid to this solution. Shake well before drinking.    Drink 1 bottle of contrast @ 12:15pm (2 hours prior to your exam)  Drink 1 bottle of contrast @ 1:15pm (1 hour  prior to your exam)  You may take any medications as prescribed with a small amount of water, if necessary. If you take any of the following medications: METFORMIN, GLUCOPHAGE, GLUCOVANCE, AVANDAMET, RIOMET, FORTAMET, [place] MET, JANUMET, GLUMETZA or METAGLIP, you MAY be asked to HOLD this medication 48 hours AFTER the exam.  The purpose of you drinking the oral contrast is to aid in the visualization of your intestinal tract. The contrast solution may cause some diarrhea. Depending on your individual set of symptoms, you may also receive an intravenous injection of x-ray contrast/dye. Plan on being at [facility] for 30 minutes or longer, depending on the type of exam you are having performed.  This test typically takes 30-45 minutes to complete.  If you have any questions regarding your exam or if you need to reschedule, you may call the CT department at [phone] between the hours of 8:00 am and 5:00 pm, Monday-Friday.  ________________________________________________________________________

## 2018-02-16 NOTE — Progress Notes (Signed)
[facility] Progress Note  Chief Complaint: IBS-C  Subjective  History:  Jonathan [name] sees me for the first time in a year.  He continues to have generalized abdominal burning discomfort that is worse if he has not had a bowel movement for several days.  Originally he had improvement from fiber, then that seemed to lose effect.  The same occurred with MiraLAX.  Linzess only caused him to have loose stool so he did not continue it.  He occasionally has some blood on the paper but no frank rectal bleeding.  As before, he is concerned about the possibility of "a blockage".  He was not entirely reassured that his last colonoscopy, when the symptoms were occurring, showed no blockage.  He relays a story about how a friend of his had similar symptoms and had to go to the emergency room, where his diagnosis was made. He has been more sedentary since retirement, he is frustrated about ongoing weight gain and does not feel he eats enough to have caused that.  ROS: Cardiovascular:  no chest pain Respiratory: no dyspnea  The patient's Past Medical, Family and Social History were reviewed and are on file in the EMR.  Objective:  Med list reviewed  Current Outpatient Medications:  .  amLODipine (NORVASC) 5 MG tablet, TAKE 1 TABLET BY MOUTH EVERY DAY, Disp: 90 tablet, Rfl: 0 .  atenolol-chlorthalidone (TENORETIC 50) 50-25 MG tablet, Take 1 tablet by mouth daily., Disp: 90 tablet, Rfl: 1   Vital signs in last 24 hrs: Vitals:   02/16/18 1550  BP: 126/84  Pulse: 72  He is 318 pounds, up 14 pounds since his last visit with me.  Physical Exam  He is alert, conversational and in no acute distress.  HEENT: sclera anicteric, oral mucosa moist without lesions  Neck: supple, no thyromegaly, JVD or lymphadenopathy  Cardiac: RRR without murmurs, S1S2 heard, no peripheral edema  Pulm: clear to auscultation bilaterally, normal RR and effort noted  Abdomen: soft, mild lower tenderness, with  active bowel sounds. No guarding or palpable hepatosplenomegaly, limited by BMI   skin; warm and dry, no jaundice or rash  Recent Labs:  CMP Latest Ref Rng & Units 10/18/2017 04/02/2016 04/09/2014  Glucose 65 - 99 mg/dL 65 98 91  BUN 8 - 27 mg/dL 14 12 12   Creatinine 0.76 - 1.27 mg/dL 0.93 0.95 0.9  Sodium 134 - 144 mmol/L 141 139 140  Potassium 3.5 - 5.2 mmol/L 3.4(L) 3.2(L) 4.4  Chloride 96 - 106 mmol/L 91(L) 94(L) 101  CO2 20 - 29 mmol/L 35(H) 37(H) 34(H)  Calcium 8.6 - 10.2 mg/dL 9.3 9.4 9.3  Total Protein 6.0 - 8.3 g/dL - 7.8 7.9  Total Bilirubin 0.2 - 1.2 mg/dL - 0.5 0.5  Alkaline Phos 39 - 117 U/L - 61 83  AST 0 - 37 U/L - 19 19  ALT 0 - 53 U/L - 13 17   Last TSH normal 2017  Radiologic studies: None for review  @ASSESSMENTPLANBEGIN @ Assessment: Encounter Diagnoses  Name Primary?  . Irritable bowel syndrome with constipation Yes  . Abdominal bloating   . Weight gain   . Generalized abdominal pain     I still believe he has IBS-C.  He is concerned about a blockage, so I scheduled CT abdomen and pelvis to be sure.  TSH and free T4 ordered as well.  I believe that increasing his activity level would help with his motility, but he feels that the constipation and  abdominal pain limit his activity.  Plan: The only medicine I feel we have left to offer his prucalopride 2 mg once daily.  We gave him a 7-day sample box to take 1 tablet daily.  He understands the diarrhea is the most common side effect.  If he has any other concerns he is to call us immediately.  If this is not helpful, or is limited by side effects, he may just have to continue taking a bowel purge periodically.  His morbid obesity also present significant health risks to him, and if he is unable to make successful efforts at improving that with diet and weight loss, referral to a multidisciplinary weight loss clinic by primary care would be in order.   Total time 25 minutes, over half spent face-to-face with  patient in counseling and coordination of care.   Nelida Meuse III

## 2018-02-23 ENCOUNTER — Ambulatory Visit: Payer: BLUE CROSS/BLUE SHIELD | Admitting: Pulmonary Disease

## 2018-03-01 ENCOUNTER — Ambulatory Visit: Payer: BLUE CROSS/BLUE SHIELD | Admitting: Pulmonary Disease

## 2018-03-03 ENCOUNTER — Ambulatory Visit (INDEPENDENT_AMBULATORY_CARE_PROVIDER_SITE_OTHER)
Admission: RE | Admit: 2018-03-03 | Discharge: 2018-03-03 | Disposition: A | Discharge status: Home / Self Care | Payer: BLUE CROSS/BLUE SHIELD | Source: Ambulatory Visit | Attending: Gastroenterology | Admitting: Gastroenterology

## 2018-03-03 DIAGNOSIS — R140 Abdominal distension (gaseous): Secondary | ICD-10-CM

## 2018-03-03 DIAGNOSIS — K581 Irritable bowel syndrome with constipation: Secondary | ICD-10-CM

## 2018-03-03 DIAGNOSIS — R1084 Generalized abdominal pain: Secondary | ICD-10-CM | POA: Diagnosis not present

## 2018-03-03 DIAGNOSIS — R635 Abnormal weight gain: Secondary | ICD-10-CM

## 2018-03-03 MED ORDER — IOPAMIDOL (ISOVUE-300) INJECTION 61%
100.0000 mL | Freq: Once | INTRAVENOUS | Status: AC | PRN
  Administered 2018-03-03: 100 mL via INTRAVENOUS

## 2018-03-16 ENCOUNTER — Encounter: Payer: Self-pay | Admitting: Gastroenterology

## 2018-03-16 ENCOUNTER — Ambulatory Visit: Payer: BLUE CROSS/BLUE SHIELD | Admitting: Gastroenterology

## 2018-03-16 VITALS — BP 128/82 | HR 78 | Ht 71.0 in | Wt 321.5 lb

## 2018-03-16 DIAGNOSIS — R140 Abdominal distension (gaseous): Secondary | ICD-10-CM | POA: Diagnosis not present

## 2018-03-16 DIAGNOSIS — K760 Fatty (change of) liver, not elsewhere classified: Secondary | ICD-10-CM

## 2018-03-16 DIAGNOSIS — K581 Irritable bowel syndrome with constipation: Secondary | ICD-10-CM

## 2018-03-16 NOTE — Patient Instructions (Addendum)
If you are age 61 or older, your body mass index should be between 23-30. Your Body mass index is 44.84 kg/m. If this is out of the aforementioned range listed, please consider follow up with your Primary Care Provider.  If you are age 2 or younger, your body mass index should be between 19-25. Your Body mass index is 44.84 kg/m. If this is out of the aformentioned range listed, please consider follow up with your Primary Care Provider.   It was a pleasure to see you today!  Dr. Loletha Carrow   Food Guidelines for gas and bloating  Many people have difficulty digesting certain foods, causing a variety of distressing and embarrassing symptoms such as abdominal pain, bloating and gas.  These foods may need to be avoided or consumed in small amounts.  Here are some tips that might be helpful for you.  1.   Lactose intolerance is the difficulty or complete inability to digest lactose, the natural sugar in milk and anything made from milk.  This condition is harmless, common, and can begin any time during life.  Some people can digest a modest amount of lactose while others cannot tolerate any.  Also, not all dairy products contain equal amounts of lactose.  For example, hard cheeses such as parmesan have less lactose than soft cheeses such as cheddar.  Yogurt has less lactose than milk or cheese.  Many packaged foods (even many brands of bread) have milk, so read ingredient lists carefully.  It is difficult to test for lactose intolerance, so just try avoiding lactose as much as possible for a week and see what happens with your symptoms.  If you seem to be lactose intolerant, the best plan is to avoid it (but make sure you get calcium from another source).  The next best thing is to use lactase enzyme supplements, available over the counter everywhere.  Just know that many lactose intolerant people need to take several tablets with each serving of dairy to avoid symptoms.  Lastly, a lot of restaurant food is  made with milk or butter.  Many are things you might not suspect, such as mashed potatoes, rice and pasta (cooked with butter) and "grilled" items.  If you are lactose intolerant, it never hurts to ask your server what has milk or butter.  2.   Fiber is an important part of your diet, but not all fiber is well-tolerated.  Insoluble fiber such as bran is often consumed by normal gut bacteria and converted into gas.  Soluble fiber such as oats, squash, carrots and green beans are typically tolerated better.  3.   Some types of carbohydrates can be poorly digested.  Examples include: fructose (apples, cherries, pears, raisins and other dried fruits), fructans (onions, zucchini, large amounts of wheat), sorbitol/mannitol/xylitol and sucralose/Splenda (common artificial sweeteners), and raffinose (lentils, broccoli, cabbage, asparagus, brussel sprouts, many types of beans).  Do a Development worker, community for [employer] and you will find helpful information. Beano, a dietary supplement, will often help with raffinose-containing foods.  As with lactase tablets, you may need several per serving.  4.   Whenever possible, avoid processed food&meats and chemical additives.  High fructose corn syrup, a common sweetener, may be difficult to digest.  Eggs and soy (comes from the soybean, and added to many foods now) are other common bloating/gassy foods.  - Dr. Herma Ard [facility]

## 2018-03-16 NOTE — Progress Notes (Signed)
[facility] Progress Note  Chief Complaint: Generalized abdominal pain  Subjective  History:  I recently saw Rhiley for chronic generalized burning abdominal discomfort with chronic constipation.  Overall, this is favored a functional bowel disorder.  Last colonoscopy did not reveal a source for pain.  When I recently saw him, he was concerned, especially since a friend had similar trouble and was found to have a blockage after an emergency department visit.  I therefore ordered a CT scan to rule out any obstructing cause or other intra-abdominal pathology that might explain this, and no such cause was discovered.  Full CT scan report noted below, and results conveyed to him by phone.  He has not previously had consistent improvement with a few treatments. Gave a short trial of prucalopride at last visit, which did not seem to help much.  For the first day after a dose it gave him bloating and gas, then followed by so much loose stool he could not take the medicine for a few more days.  He still has a central burning periumbilic discomfort. He is due to be seen by a health and wellness provider in the near future, and he hopes to get some additional conditions on diet and lifestyle because he is unhappy about his weight gain and feels it is making him unhealthy.   ROS: Cardiovascular:  no chest pain Respiratory: no dyspnea  The patient's Past Medical, Family and Social History were reviewed and are on file in the EMR.  Objective:  Med list reviewed  Current Outpatient Medications:  .  amLODipine (NORVASC) 5 MG tablet, TAKE 1 TABLET BY MOUTH EVERY DAY, Disp: 90 tablet, Rfl: 0 .  atenolol-chlorthalidone (TENORETIC 50) 50-25 MG tablet, Take 1 tablet by mouth daily., Disp: 90 tablet, Rfl: 1   Vital signs in last 24 hrs: Vitals:   03/16/18 1117  BP: 128/82  Pulse: 78    Physical Exam No exam.  Entire 20 minute visit spent in discussion.  Recent Labs:  TSH and Free T4  normal  Radiologic studies:  (Images personally reviewed. -Also noted retained stool to the right colon)  Multidetector CT imaging of the abdomen and pelvis was performed using the standard protocol following bolus administration of intravenous contrast.   CONTRAST:  137mL ISOVUE-300 IOPAMIDOL (ISOVUE-300) INJECTION 61%   COMPARISON:  Abdominal ultrasound of 02/22/2012.  No prior CT.   FINDINGS: Lower chest: Clear lung bases. Normal heart size without pericardial or pleural effusion.   Hepatobiliary: Probable hepatic steatosis and moderate hepatomegaly at greater than 20 cm craniocaudal. No focal liver lesion. Normal gallbladder, without biliary ductal dilatation.   Pancreas: Heterogeneous fatty replacement within the pancreatic head and uncinate process. No duct dilatation or acute inflammation.   Spleen: Normal in size, without focal abnormality.   Adrenals/Urinary Tract: Normal adrenal glands. Bilateral too small to characterize renal lesions. No hydronephrosis. Significant pelvic degradation secondary to beam hardening artifact from bilateral hip arthroplasties. Grossly normal urinary bladder.   Stomach/Bowel: Normal stomach, without wall thickening. Limited evaluation of the rectum. More proximally, there are scattered colonic diverticula but no evidence of bowel obstruction. A nonobstructive lipoma is identified within the ascending colon including at 1.9 cm on image 36/2. Normal terminal ileum and appendix. Normal small bowel.   Vascular/Lymphatic: Normal caliber of the aorta and branch vessels. No abdominal adenopathy. No gross pelvic sidewall adenopathy.   Reproductive: Prostate surgically absent by clinical history.   Other: No abdominal ascites   Musculoskeletal: Incompletely  imaged left-sided gynecomastia. Bilateral hip arthroplasty. Near complete fusion of the bilateral sacroiliac joints is likely degenerative. Irregularity of the left iliac bone is likely  present back in 2015 and may be a site of bone harvest. Advanced lumbosacral spondylosis.   IMPRESSION: 1. Degraded evaluation of the pelvis, secondary to beam hardening artifact from bilateral hip arthroplasties. 2. Given this limitation, no acute process or explanation for patient's symptoms. 3. Hepatomegaly and probable hepatic steatosis. 4. Ascending colonic nonobstructive lipoma. 5. Left gynecomastia.     Electronically Signed   By: Abigail Miyamoto M.D.   On: 03/03/2018 15:46   @[email or web address] @ Assessment: Encounter Diagnoses  Name Primary?  . Irritable bowel syndrome with constipation Yes  . Abdominal bloating   . Fatty liver     Tranell has a functional bowel disorder, extensive work-up negative thus far.  He is either not improved on, or had side effects from, multiple therapies.  I done my best to reassure him.  A low gas and bloating diet was given. Fatty liver was discussed, and I think his journey toward health and wellness with weight loss would help with this.  He will see me next for routine colonoscopy in summer 2022 due to family history of colon cancer, and sooner if needed.  Nelida Meuse III

## 2018-03-21 ENCOUNTER — Ambulatory Visit (INDEPENDENT_AMBULATORY_CARE_PROVIDER_SITE_OTHER): Payer: BLUE CROSS/BLUE SHIELD | Admitting: Pulmonary Disease

## 2018-03-21 ENCOUNTER — Encounter: Payer: Self-pay | Admitting: Pulmonary Disease

## 2018-03-21 VITALS — BP 140/80 | HR 87

## 2018-03-21 DIAGNOSIS — G4733 Obstructive sleep apnea (adult) (pediatric): Secondary | ICD-10-CM

## 2018-03-21 DIAGNOSIS — Z9989 Dependence on other enabling machines and devices: Secondary | ICD-10-CM

## 2018-03-21 LAB — PULMONARY FUNCTION TEST
DL/VA % PRED: 123 %
DL/VA: 5.8 ml/min/mmHg/L
DLCO UNC: 23.03 ml/min/mmHg
DLCO unc % pred: 68 %
FEF 25-75 POST: 3.42 L/s
FEF 25-75 PRE: 3.04 L/s
FEF2575-%Change-Post: 12 %
FEF2575-%PRED-PRE: 102 %
FEF2575-%Pred-Post: 115 %
FEV1-%Change-Post: -2 %
FEV1-%Pred-Post: 64 %
FEV1-%Pred-Pre: 65 %
FEV1-PRE: 2.13 L
FEV1-Post: 2.07 L
FEV1FVC-%Change-Post: 0 %
FEV1FVC-%PRED-PRE: 110 %
FEV6-%CHANGE-POST: -2 %
FEV6-%PRED-POST: 59 %
FEV6-%PRED-PRE: 60 %
FEV6-Post: 2.38 L
FEV6-Pre: 2.44 L
FEV6FVC-%CHANGE-POST: 0 %
FEV6FVC-%PRED-POST: 102 %
FEV6FVC-%Pred-Pre: 102 %
FVC-%Change-Post: -2 %
FVC-%PRED-POST: 58 %
FVC-%PRED-PRE: 59 %
FVC-POST: 2.41 L
FVC-Pre: 2.47 L
POST FEV6/FVC RATIO: 99 %
PRE FEV1/FVC RATIO: 86 %
Post FEV1/FVC ratio: 86 %
Pre FEV6/FVC Ratio: 99 %
RV % pred: 67 %
RV: 1.56 L
TLC % PRED: 57 %
TLC: 4.16 L

## 2018-03-21 NOTE — Patient Instructions (Signed)
Obstructive sleep apnea-tolerating CPAP better Encouraged to continue working on weight loss  I will see you back in the office in about 3 months Continue with weight loss efforts Continue with using CPAP on a regular basis  Call with any significant concerns

## 2018-03-21 NOTE — Progress Notes (Addendum)
Jonathan [name]    [id number]    02-05-1956  Primary Care Physician:Nafziger, Tommi Rumps, NP  Referring Physician: Dorothyann Peng, NP [name], [name] [address]  Chief complaint:   Shortness of breath  Obstructive sleep apnea  HPI: Has been doing well since her last visit Getting used to using CPAP on a regular basis-tolerating it better Feels sleep quality is better He recently had a cardiopulmonary exercise study-consistent with deconditioning Has been working on weight loss and optimizing his diet, down 9 pounds  Progressive shortness of breath over the last couple years  Loud breathing even when taking a nap during the day  Has gained a lot of weight since retiring from the [employer]-he is changing many things to help him lose weight Denies a headache, he does have a dry throat of the mornings History of prostate cancer Orthopnea  Occupation: Worked for the postal service Exposures: No exposure to mold Smoking history: Non-smoker  Outpatient Encounter Medications as of 03/21/2018  Medication Sig  . amLODipine (NORVASC) 5 MG tablet TAKE 1 TABLET BY MOUTH EVERY DAY  . atenolol-chlorthalidone (TENORETIC 50) 50-25 MG tablet Take 1 tablet by mouth daily.   No facility-administered encounter medications on file as of 03/21/2018.     Allergies as of 03/21/2018 - Review Complete 03/21/2018  Allergen Reaction Noted  . Codeine    . Cozaar [losartan potassium] Hives 09/08/2011    Past Medical History:  Diagnosis Date  . Anxiety   . Arthritis   . Asthma   . BPH (benign prostatic hyperplasia)   . ED (erectile dysfunction)   . Elevated PSA    9.33  . Esophageal reflux   . Hypertension   . Prostate cancer [facility]) Jan 2016   prostatectomy    Past Surgical History:  Procedure Laterality Date  . CORONARY CALCIUM SCORE / CARDIAC CT ANGIOGRAM  10/24/2017   Cor Ca Score = 0 - Low risk. Cor CTA: only mild plaque noted in mRCA with mLAD intramyocardial  bridging noted.  [name] FOOT SURGERY     bilateral foot  . NM MYOVIEW LTD  07/2007   Normal nuclear stress test.  Mild fixed defect in the anterior wall likely related to soft tissue attenuation.  Normal EF.  Fair exercise capacity.  [name] PROSTATE BIOPSY  06/13/2012   right lat mid gleason 3+3=6  . PROSTATECTOMY  2016  . TOTAL HIP ARTHROPLASTY  2008 &2007   Right and left    Family History  Problem Relation Age of Onset  . Hypertension Mother   . Colon cancer Mother   . Prostate cancer Father 46       alive now age 25  . Asthma Father   . Heart disease Father        Pt is not sure of details (no MI)  . Lung cancer Maternal Grandmother        non-smoker  . Tuberculosis Brother     Social History   Socioeconomic History  . Marital status: Married    Spouse name: Not on file  . Number of children: 2  . Years of education: Not on file  . Highest education level: Not on file  Occupational History  . Occupation: Education administrator: [employer]  Social Needs  . Financial resource strain: Not on file  . Food insecurity:    Worry: Not on file    Inability: Not on file  . Transportation needs:  Medical: Not on file    Non-medical: Not on file  Tobacco Use  . Smoking status: Never Smoker  . Smokeless tobacco: Never Used  Substance and Sexual Activity  . Alcohol use: No  . Drug use: No  . Sexual activity: Not on file  Lifestyle  . Physical activity:    Days per week: Not on file    Minutes per session: Not on file  . Stress: Not on file  Relationships  . Social connections:    Talks on phone: Not on file    Gets together: Not on file    Attends religious service: Not on file    Active member of club or organization: Not on file    Attends meetings of clubs or organizations: Not on file    Relationship status: Not on file  . Intimate partner violence:    Fear of current or ex partner: Not on file    Emotionally abused: Not on file    Physically abused: Not on file    Forced  sexual activity: Not on file  Other Topics Concern  . Not on file  Social History Narrative  . Not on file    Review of systems: Review of Systems  Constitutional: Negative for fever and chills.  HENT: Negative Eyes: No visual blurring Respiratory: Shortness of breath with exertion, slightly better Cardiovascular: orthopnea, bradycardia Musculoskeletal: Negative for myalgias, back pain and joint pain.  All other systems reviewed and are negative.  Physical Exam:  Vitals:   03/21/18 1535  BP: 140/80  Pulse: 87  SpO2: 90%   Gen:      No acute distress HEENT: Mallampati 3, Neck:     No thyromegaly Lungs:    Clear breath sounds bilaterally CV:         Regular rate and rhythm; no murmurs Abd:   Bowel sounds appreciated Ext:    No edema; adequate peripheral perfusion Skin:     Warm and dry Neuro: alert and oriented x 3 Psych: normal mood and affect   Results of the Epworth flowsheet 03/21/2018 11/22/2017  Sitting and reading 0 3  Watching TV 0 3  Sitting, inactive in a public place (e.g. a theatre or a meeting) 0 3  As a passenger in a car for an hour without a break 0 3  Lying down to rest in the afternoon when circumstances permit 3 3  Sitting and talking to someone 0 2  Sitting quietly after a lunch without alcohol 0 2  In a car, while stopped for a few minutes in traffic 0 0  Total score 3 19    Data Reviewed: [name]  Recent CT, coronary CT noted  .  Cardiopulmonary exercise study result noted -Compliance revealed 67% compliance, residual AHI of 5.7 -We discussed improving compliance  PFT shows no obstruction, no significant bronchodilator response, moderate restriction with mild reduction in diffusing capacity  Assessment:  .  Obstructive sleep apnea adequately treated with CPAP therapy .  obstructive sleep apnea  .  Morbid obesity -is working on weight loss, has lost about 9 pounds recently, working on his diet, working on regular exercises -Joined the  gym -He has a referral to the rehab program  .  Pulmonary hypertension .  His daytime sleepiness is improved significantly  Plan/Recommendations: .  Continue CPAP -Encouraged about improving compliance  .  Aggressive weight loss measures -Encouraged about diet and exercise  .  Regular exercises  I will see him back in  the office in about 3 months  Pathophysiology of sleep disordered breathing discussed, Importance of weight loss discussed    Sherrilyn Rist MD [facility] 03/21/2018, 3:57 PM  CC: Jonathan Peng, NP

## 2018-03-21 NOTE — Progress Notes (Signed)
PFT done today. 

## 2018-04-06 ENCOUNTER — Ambulatory Visit (INDEPENDENT_AMBULATORY_CARE_PROVIDER_SITE_OTHER): Payer: BLUE CROSS/BLUE SHIELD | Admitting: Adult Health

## 2018-04-06 ENCOUNTER — Encounter: Payer: Self-pay | Admitting: Adult Health

## 2018-04-06 VITALS — BP 150/84 | HR 63 | Temp 97.5°F | Ht 71.0 in | Wt 321.8 lb

## 2018-04-06 DIAGNOSIS — Z0000 Encounter for general adult medical examination without abnormal findings: Secondary | ICD-10-CM

## 2018-04-06 DIAGNOSIS — Z1159 Encounter for screening for other viral diseases: Secondary | ICD-10-CM

## 2018-04-06 DIAGNOSIS — I10 Essential (primary) hypertension: Secondary | ICD-10-CM | POA: Diagnosis not present

## 2018-04-06 DIAGNOSIS — Z114 Encounter for screening for human immunodeficiency virus [HIV]: Secondary | ICD-10-CM

## 2018-04-06 LAB — LIPID PANEL
Cholesterol: 175 mg/dL (ref 0–200)
HDL: 43.5 mg/dL (ref >39.00)
LDL Cholesterol: 102 mg/dL — ABNORMAL HIGH (ref 0–99)
NONHDL: 131.72
Total CHOL/HDL Ratio: 4
Triglycerides: 149 mg/dL (ref 0.0–149.0)
VLDL: 29.8 mg/dL (ref 0.0–40.0)

## 2018-04-06 LAB — COMPREHENSIVE METABOLIC PANEL
ALBUMIN: 3.8 g/dL (ref 3.5–5.2)
ALK PHOS: 62 U/L (ref 39–117)
ALT: 14 U/L (ref 0–53)
AST: 14 U/L (ref 0–37)
BUN: 14 mg/dL (ref 6–23)
CO2: 37 mEq/L — ABNORMAL HIGH (ref 19–32)
Calcium: 9.5 mg/dL (ref 8.4–10.5)
Chloride: 94 mEq/L — ABNORMAL LOW (ref 96–112)
Creatinine, Ser: 0.92 mg/dL (ref 0.40–1.50)
GFR: 107.42 mL/min (ref >60.00)
Glucose, Bld: 110 mg/dL — ABNORMAL HIGH (ref 70–99)
POTASSIUM: 3.4 meq/L — AB (ref 3.5–5.1)
SODIUM: 138 meq/L (ref 135–145)
TOTAL PROTEIN: 8 g/dL (ref 6.0–8.3)
Total Bilirubin: 0.5 mg/dL (ref 0.2–1.2)

## 2018-04-06 LAB — CBC WITH DIFFERENTIAL/PLATELET
Basophils Absolute: 0 10*3/uL (ref 0.0–0.1)
Basophils Relative: 0.3 % (ref 0.0–3.0)
EOS PCT: 2 % (ref 0.0–5.0)
Eosinophils Absolute: 0.2 10*3/uL (ref 0.0–0.7)
HEMATOCRIT: 41.3 % (ref 39.0–52.0)
HEMOGLOBIN: 13.4 g/dL (ref 13.0–17.0)
LYMPHS PCT: 20.8 % (ref 12.0–46.0)
Lymphs Abs: 2 10*3/uL (ref 0.7–4.0)
MCHC: 32.5 g/dL (ref 30.0–36.0)
MCV: 82.2 fl (ref 78.0–100.0)
MONOS PCT: 9.9 % (ref 3.0–12.0)
Monocytes Absolute: 1 10*3/uL (ref 0.1–1.0)
Neutro Abs: 6.5 10*3/uL (ref 1.4–7.7)
Neutrophils Relative %: 67 % (ref 43.0–77.0)
Platelets: 302 10*3/uL (ref 150.0–400.0)
RBC: 5.02 Mil/uL (ref 4.22–5.81)
RDW: 17.4 % — ABNORMAL HIGH (ref 11.5–15.5)
WBC: 9.7 10*3/uL (ref 4.0–10.5)

## 2018-04-06 LAB — HEMOGLOBIN A1C: HEMOGLOBIN A1C: 6.5 % (ref 4.6–6.5)

## 2018-04-06 LAB — TSH: TSH: 3.05 u[IU]/mL (ref 0.35–4.50)

## 2018-04-06 NOTE — Progress Notes (Signed)
Subjective:    Patient ID: Jonathan [name], male    DOB: 07-09-1955, 62 y.o.   MRN: [id number]  HPI Patient presents for yearly preventative medicine examination. He is a pleasant 62 year old male who  has a past medical history of Anxiety, Arthritis, Asthma, BPH (benign prostatic hyperplasia), ED (erectile dysfunction), Elevated PSA, Esophageal reflux, Hypertension, and Prostate cancer [facility]) (Jan 2016).  Essential Hypertension - Controlled on Norvasc 5 mg and Tenoretic 50-25 mg. He took his medications just PTA.  BP Readings from Last 3 Encounters:  04/06/18 (!) 150/84  03/21/18 140/80  03/16/18 128/82   OSA - Followed by Pulmonary. Wears CPAP on a regular basis. Feels as though starting CPAP he is getting a better nights sleep.   H/o Prostate Cancer - is followed by Urology in [place] every 6 months   Morbid Obesity - He has not been eating healthy and exercising infrequently. He has his information session with the weight loss clinic.   Wt Readings from Last 3 Encounters:  04/06/18 (!) 321 lb 12.8 oz (146 kg)  03/16/18 (!) 321 lb 8 oz (145.8 kg)  02/16/18 (!) 318 lb 2 oz (144.3 kg)    All immunizations and health maintenance protocols were reviewed with the patient and needed orders were placed. Refuses flu shot   Appropriate screening laboratory values were ordered for the patient including screening of hyperlipidemia, renal function and hepatic function.  Medication reconciliation,  past medical history, social history, problem list and allergies were reviewed in detail with the patient  Goals were established with regard to weight loss, exercise, and  diet in compliance with medications  End of life planning was discussed.  He is up to date on routine screening colonoscopies and dental visits.   Review of Systems  Constitutional: Negative.   HENT: Negative.   Eyes: Negative.   Respiratory: Negative.   Cardiovascular: Negative.   Gastrointestinal: Negative.     Endocrine: Negative.   Genitourinary: Negative.   Musculoskeletal: Negative.   Skin: Negative.   Allergic/Immunologic: Negative.   Neurological: Negative.   Hematological: Negative.   Psychiatric/Behavioral: Negative.   All other systems reviewed and are negative.  Past Medical History:  Diagnosis Date  . Anxiety   . Arthritis   . Asthma   . BPH (benign prostatic hyperplasia)   . ED (erectile dysfunction)   . Elevated PSA    9.33  . Esophageal reflux   . Hypertension   . Prostate cancer [facility]) Jan 2016   prostatectomy    Social History   Socioeconomic History  . Marital status: Married    Spouse name: Not on file  . Number of children: 2  . Years of education: Not on file  . Highest education level: Not on file  Occupational History  . Occupation: Education administrator: [employer]  Social Needs  . Financial resource strain: Not on file  . Food insecurity:    Worry: Not on file    Inability: Not on file  . Transportation needs:    Medical: Not on file    Non-medical: Not on file  Tobacco Use  . Smoking status: Never Smoker  . Smokeless tobacco: Never Used  Substance and Sexual Activity  . Alcohol use: No  . Drug use: No  . Sexual activity: Not on file  Lifestyle  . Physical activity:    Days per week: Not on file    Minutes per session: Not on file  . Stress: Not  on file  Relationships  . Social connections:    Talks on phone: Not on file    Gets together: Not on file    Attends religious service: Not on file    Active member of club or organization: Not on file    Attends meetings of clubs or organizations: Not on file    Relationship status: Not on file  . Intimate partner violence:    Fear of current or ex partner: Not on file    Emotionally abused: Not on file    Physically abused: Not on file    Forced sexual activity: Not on file  Other Topics Concern  . Not on file  Social History Narrative  . Not on file    Past Surgical History:  Procedure  Laterality Date  . CORONARY CALCIUM SCORE / CARDIAC CT ANGIOGRAM  10/24/2017   Cor Ca Score = 0 - Low risk. Cor CTA: only mild plaque noted in mRCA with mLAD intramyocardial bridging noted.  [name] FOOT SURGERY     bilateral foot  . NM MYOVIEW LTD  07/2007   Normal nuclear stress test.  Mild fixed defect in the anterior wall likely related to soft tissue attenuation.  Normal EF.  Fair exercise capacity.  [name] PROSTATE BIOPSY  06/13/2012   right lat mid gleason 3+3=6  . PROSTATECTOMY  2016  . TOTAL HIP ARTHROPLASTY  2008 &2007   Right and left    Family History  Problem Relation Age of Onset  . Hypertension Mother   . Colon cancer Mother   . Prostate cancer Father 9       alive now age 83  . Asthma Father   . Heart disease Father        Pt is not sure of details (no MI)  . Lung cancer Maternal Grandmother        non-smoker  . Tuberculosis Brother     Allergies  Allergen Reactions  . Codeine     REACTION: emesis  . Cozaar [Losartan Potassium] Hives    Current Outpatient Medications on File Prior to Visit  Medication Sig Dispense Refill  . amLODipine (NORVASC) 5 MG tablet TAKE 1 TABLET BY MOUTH EVERY DAY 90 tablet 0  . atenolol-chlorthalidone (TENORETIC 50) 50-25 MG tablet Take 1 tablet by mouth daily. 90 tablet 1   No current facility-administered medications on file prior to visit.     BP (!) 150/84 (BP Location: Left Arm, Patient Position: Sitting, Cuff Size: Normal)   Pulse 63   Temp (!) 97.5 F (36.4 C) (Oral)   Ht 5\' 11"  (1.803 m)   Wt (!) 321 lb 12.8 oz (146 kg)   SpO2 94%   BMI 44.88 kg/m       Objective:   Physical Exam Vitals signs and nursing note reviewed.  Constitutional:      General: He is not in acute distress.    Appearance: He is well-developed. He is obese. He is not diaphoretic.  HENT:     Head: Normocephalic and atraumatic.     Right Ear: Tympanic membrane, ear canal and external ear normal. There is no impacted cerumen.     Left Ear: Tympanic  membrane, ear canal and external ear normal.     Nose: Nose normal. No congestion or rhinorrhea.     Mouth/Throat:     Mouth: Mucous membranes are moist.     Pharynx: Oropharynx is clear. No oropharyngeal exudate or posterior oropharyngeal erythema.  Eyes:  General:        Right eye: No discharge.        Left eye: No discharge.     Extraocular Movements: Extraocular movements intact.     Conjunctiva/sclera: Conjunctivae normal.     Pupils: Pupils are equal, round, and reactive to light.  Neck:     Musculoskeletal: Normal range of motion and neck supple.     Thyroid: No thyromegaly.     Vascular: No carotid bruit.     Trachea: No tracheal deviation.  Cardiovascular:     Rate and Rhythm: Normal rate and regular rhythm.     Pulses: Normal pulses.     Heart sounds: Normal heart sounds. No murmur. No friction rub. No gallop.   Pulmonary:     Effort: Pulmonary effort is normal. No respiratory distress.     Breath sounds: Normal breath sounds. No stridor. No wheezing, rhonchi or rales.  Chest:     Chest wall: No tenderness.  Abdominal:     General: Bowel sounds are normal. There is no distension.     Palpations: Abdomen is soft. There is no mass.     Tenderness: There is no abdominal tenderness. There is no right CVA tenderness, left CVA tenderness, guarding or rebound.     Hernia: No hernia is present.  Musculoskeletal: Normal range of motion.        General: No swelling, tenderness, deformity or signs of injury.     Right lower leg: No edema.     Left lower leg: No edema.  Lymphadenopathy:     Cervical: No cervical adenopathy.  Skin:    General: Skin is warm and dry.     Capillary Refill: Capillary refill takes less than 2 seconds.     Coloration: Skin is not jaundiced or pale.     Findings: No bruising, erythema, lesion or rash.  Neurological:     Mental Status: He is alert and oriented to person, place, and time. Mental status is at baseline.     Cranial Nerves: No cranial  nerve deficit.     Sensory: No sensory deficit.     Motor: No weakness.     Coordination: Coordination normal.     Gait: Gait normal.     Deep Tendon Reflexes: Reflexes normal.  Psychiatric:        Mood and Affect: Mood normal.        Behavior: Behavior normal.        Thought Content: Thought content normal.        Judgment: Judgment normal.       Assessment & Plan:  1. Routine general medical examination at a health care facility - Follow up in one year or sooner if needed - CBC with Differential/Platelet - Comprehensive metabolic panel - Hemoglobin A1c - Lipid panel - TSH  2. Essential hypertension - Usually controlled. No change in medications at this time  - CBC with Differential/Platelet - Comprehensive metabolic panel - Hemoglobin A1c - Lipid panel - TSH  3. Morbid obesity ([place]) - Encouraged to start exercising and eating healthy  - CBC with Differential/Platelet - Comprehensive metabolic panel - Hemoglobin A1c - Lipid panel - TSH  4. Encounter for screening for HIV  - HIV Antibody (routine testing w rflx)   Dorothyann Peng, NP  5. Need for hepatitis C screening test  - Hep C Antibody  Dorothyann Peng, NP

## 2018-04-07 LAB — HIV ANTIBODY (ROUTINE TESTING W REFLEX): HIV: NONREACTIVE

## 2018-04-07 LAB — HEPATITIS C ANTIBODY
Hepatitis C Ab: NONREACTIVE
SIGNAL TO CUT-OFF: 0.04 (ref <1.00)

## 2018-04-20 ENCOUNTER — Encounter (INDEPENDENT_AMBULATORY_CARE_PROVIDER_SITE_OTHER): Payer: Self-pay

## 2018-04-27 ENCOUNTER — Ambulatory Visit (INDEPENDENT_AMBULATORY_CARE_PROVIDER_SITE_OTHER): Payer: Self-pay | Admitting: Family Medicine

## 2018-05-04 ENCOUNTER — Other Ambulatory Visit: Payer: Self-pay | Admitting: Adult Health

## 2018-05-04 DIAGNOSIS — I10 Essential (primary) hypertension: Secondary | ICD-10-CM

## 2018-05-04 MED ORDER — AMLODIPINE BESYLATE 5 MG PO TABS: 5.0000 mg | ORAL_TABLET | Freq: Every day | ORAL | 0 refills | Status: DC

## 2018-05-04 NOTE — Addendum Note (Signed)
Addended by: Zacarias Pontes on: 05/04/2018 08:06 AM   Modules accepted: Orders

## 2018-05-11 ENCOUNTER — Ambulatory Visit (INDEPENDENT_AMBULATORY_CARE_PROVIDER_SITE_OTHER): Payer: Self-pay | Admitting: Family Medicine

## 2018-06-20 ENCOUNTER — Ambulatory Visit: Payer: BLUE CROSS/BLUE SHIELD | Admitting: Pulmonary Disease

## 2018-08-01 ENCOUNTER — Ambulatory Visit: Payer: BLUE CROSS/BLUE SHIELD | Admitting: Pulmonary Disease

## 2018-08-01 ENCOUNTER — Other Ambulatory Visit: Payer: Self-pay | Admitting: Adult Health

## 2018-08-01 NOTE — Telephone Encounter (Signed)
Sent to the pharmacy by e-scribe. 

## 2018-08-28 ENCOUNTER — Other Ambulatory Visit: Payer: Self-pay

## 2018-08-28 ENCOUNTER — Encounter (INDEPENDENT_AMBULATORY_CARE_PROVIDER_SITE_OTHER): Payer: Self-pay | Admitting: Bariatrics

## 2018-08-28 ENCOUNTER — Ambulatory Visit (INDEPENDENT_AMBULATORY_CARE_PROVIDER_SITE_OTHER): Payer: Medicare Other | Admitting: Bariatrics

## 2018-08-28 VITALS — BP 127/75 | HR 62 | Temp 98.1°F | Ht 70.0 in | Wt 319.0 lb

## 2018-08-28 DIAGNOSIS — Z9189 Other specified personal risk factors, not elsewhere classified: Secondary | ICD-10-CM

## 2018-08-28 DIAGNOSIS — F3289 Other specified depressive episodes: Secondary | ICD-10-CM | POA: Diagnosis not present

## 2018-08-28 DIAGNOSIS — I10 Essential (primary) hypertension: Secondary | ICD-10-CM | POA: Diagnosis not present

## 2018-08-28 DIAGNOSIS — Z6841 Body Mass Index (BMI) 40.0 and over, adult: Secondary | ICD-10-CM

## 2018-08-28 DIAGNOSIS — E559 Vitamin D deficiency, unspecified: Secondary | ICD-10-CM

## 2018-08-28 DIAGNOSIS — R0602 Shortness of breath: Secondary | ICD-10-CM

## 2018-08-28 DIAGNOSIS — R5383 Other fatigue: Secondary | ICD-10-CM | POA: Diagnosis not present

## 2018-08-28 DIAGNOSIS — Z0289 Encounter for other administrative examinations: Secondary | ICD-10-CM

## 2018-08-28 DIAGNOSIS — I272 Pulmonary hypertension, unspecified: Secondary | ICD-10-CM

## 2018-08-28 DIAGNOSIS — E7849 Other hyperlipidemia: Secondary | ICD-10-CM

## 2018-08-28 DIAGNOSIS — E119 Type 2 diabetes mellitus without complications: Secondary | ICD-10-CM

## 2018-08-28 NOTE — Progress Notes (Signed)
Office: [phone]  /  Fax: [phone]   Dear Dorothyann Peng, NP,   Thank you for referring Jonathan [name] to our clinic. The following note includes my evaluation and treatment recommendations.  HPI:   Chief Complaint: OBESITY    Jonathan [name] has been referred by Dorothyann Peng, NP for consultation regarding his obesity and obesity related comorbidities.    Jonathan [name] (MR# [id number]) is a 63 y.o. male who presents on 08/28/2018 for obesity evaluation and treatment. Current BMI is Body mass index is 45.77 kg/m.[name] Jonathan [name] has been struggling with his weight for many years and has been unsuccessful in either losing weight, maintaining weight loss, or reaching his healthy weight goal.     Jonathan [name] attended our information session and states he is currently in the action stage of change and ready to dedicate time achieving and maintaining a healthier weight. Jonathan [name] is interested in becoming our patient and working on intensive lifestyle modifications including (but not limited to) diet, exercise and weight loss.    Jonathan [name] states his family eats meals together he struggles with family and or coworkers weight loss sabotage his desired weight loss is 104 lbs. he started gaining weight after age 25 his heaviest weight ever was 318 lbs. he admits to binge eating he has significant food cravings issues  he snacks frequently in the evenings he wakes up frequently in the middle of the night to eat he is frequently drinking liquids with calories he states he makes poor food choices he states that he has excessive hunger  he frequently eats larger portions than normal  he has binge eating behaviors he struggles with emotional eating  he will start cooking for himself   Fatigue Sabastien feels his energy is lower than it should be. This has worsened with weight gain and has not worsened recently. Jadrian admits to daytime somnolence and he admits to waking up still tired. Patient has a  history of obstructive sleep apnea which may contribute to fatigue. Patent has a history of symptoms of daytime fatigue, morning fatigue, morning headache and hypertension. Patient generally gets 5 hours of sleep per night, and states they generally have generally restless sleep. Snoring is present. Apneic episodes are present.   Dyspnea on exertion Jonathan [name] notes increasing shortness of breath with exercising and seems to be worsening over time with weight gain. He notes getting out of breath sooner with activity than he used to. This has not gotten worse recently. Jonathan [name] denies orthopnea.  Hypertension Jonathan [name] is a 63 y.o. male with hypertension. Jonathan [name] denies chest pain. He is working weight loss to help control his blood pressure with the goal of decreasing his risk of heart attack and stroke. Normans blood pressure is well controlled with medications.  Chronic Pulmonary Hypertension Dreon has a diagnosis of chronic pulmonary hypertension. He has a history of bradycardia. Jonathan [name] has a history of abnormal EKG in April 2019. EKG was similar today.  Hyperlipidemia Jonathan [name] has hyperlipidemia and he is not on medications. He is attempting to improve his cholesterol levels with intensive lifestyle modification including a low saturated fat diet, exercise and weight loss. He denies any chest pain or myalgias.  [name] has a diagnosis of vitamin D deficiency. He is currently taking vit D and denies nausea, vomiting or muscle weakness.  At risk for osteopenia and osteoporosis Jonathan [name] is at higher risk of osteopenia and osteoporosis due to vitamin D deficiency.   Diabetes II Jonathan [name]  has a diagnosis of diabetes type II. Hervey denies any hypoglycemic episodes. Last A1c was at 6.5 He is attempting to work on intensive lifestyle modifications including diet, exercise, and weight loss to help control his blood glucose levels.  Depression with emotional eating behaviors  Jonathan [name] is struggling with emotional eating and using food for comfort to the extent that it is negatively impacting his health. He often snacks when he is not hungry. Kysen sometimes feels he is out of control and then feels guilty that he made poor food choices. Mandy has some binging behaviors. He is attempting to work on behavior modification techniques to help reduce his emotional eating. He shows no sign of suicidal or homicidal ideations. PHQ-9 Score is 19  Depression Screen Prithvi's Food and Mood (modified PHQ-9) score was  Depression screen PHQ 2/9 08/28/2018  Decreased Interest 3  Down, Depressed, Hopeless 2  PHQ - 2 Score 5  Altered sleeping 3  Tired, decreased energy 3  Change in appetite 3  Feeling bad or failure about yourself  1  Trouble concentrating 0  Moving slowly or fidgety/restless 3  Suicidal thoughts 1  PHQ-9 Score 19  Difficult doing work/chores Extremely dIfficult    ASSESSMENT AND PLAN:  Other fatigue - Plan: EKG 12-Lead  Shortness of breath on exertion  Essential hypertension  Chronic pulmonary hypertension (HCC)  Other hyperlipidemia  Vitamin D deficiency - Plan: VITAMIN D 25 Hydroxy (Vit-D Deficiency, Fractures)  Type 2 diabetes mellitus without complication, without long-term current use of insulin (HCC) - Plan: Comprehensive metabolic panel, Hemoglobin A1c, Insulin, random  Other depression - with emotional eating  At risk for osteoporosis  Class 3 severe obesity with serious comorbidity and body mass index (BMI) of 45.0 to 49.9 in adult, unspecified obesity type (HCC)  PLAN:  Fatigue Tamika was informed that his fatigue may be related to obesity, depression or many other causes. Labs will be ordered, and in the meanwhile Lebert has agreed to work on diet, exercise and weight loss to help with fatigue. Proper sleep hygiene was discussed including the need for 7-8 hours of quality sleep each night. Finch will wear his CPAP.  Dyspnea on  exertion Vennie's shortness of breath appears to be obesity related and exercise induced. He has agreed to work on weight loss and gradually increase exercise to treat his exercise induced shortness of breath. If Nell follows our instructions and loses weight without improvement of his shortness of breath, we will plan to refer to pulmonology. We will monitor this condition regularly. Safal agrees to this plan.  Hypertension We discussed sodium restriction, working on healthy weight loss, and a regular exercise program as the means to achieve improved blood pressure control. Chibuike agreed with this plan and agreed to follow up as directed. We will continue to monitor his blood pressure as well as his progress with the above lifestyle modifications. He will continue his medications as prescribed and will watch for signs of hypotension as he continues his lifestyle modifications.  Chronic Pulmonary Hypertension Layn will follow up with the pulmonologist and cardiologist. He will follow up with our clinic in 2 weeks.  Hyperlipidemia Jonathan [name] was informed of the American Heart Association Guidelines emphasizing intensive lifestyle modifications as the first line treatment for hyperlipidemia. We discussed many lifestyle modifications today in depth, and Darran will start to work on decreasing saturated fats such as fatty red meat, butter and many fried foods. He will also increase vegetables and lean protein in his diet and  start to work on exercise and weight loss efforts. Garrette does not want to start any medications.  Vitamin D Deficiency Iker was informed that low vitamin D levels contributes to fatigue and are associated with obesity, breast, and colon cancer. He will continue to take Vit D and will follow up for routine testing of vitamin D, at least 2-3 times per year. He was informed of the risk of over-replacement of vitamin D and agrees to not increase his dose unless he discusses this with  [name] first. We will check vitamin D level today and Jonathan [name] will follow up as directed.  At risk for osteopenia and osteoporosis Jonathan [name] was given extended  (15 minutes) osteoporosis prevention counseling today. Jonathan [name] is at risk for osteopenia and osteoporosis due to his vitamin D deficiency. He was encouraged to take his vitamin D and follow his higher calcium diet and increase strengthening exercise to help strengthen his bones and decrease his risk of osteopenia and osteoporosis.  Diabetes II Jonathan [name] has been given extensive diabetes education by myself today including ideal fasting and post-prandial blood glucose readings, individual ideal Hgb A1c goals and hypoglycemia prevention. We discussed the importance of good blood sugar control to decrease the likelihood of diabetic complications such as nephropathy, neuropathy, limb loss, blindness, coronary artery disease, and death. We discussed the importance of intensive lifestyle modification including diet, exercise and weight loss as the first line treatment for diabetes. Yida will work on decreasing simple carbohydrates and sweets in his diet and will follow up at the agreed upon time.  Depression with Emotional Eating Behaviors We discussed behavior modification techniques today to help Jonathan [name] deal with his emotional eating and depression. We will refer Jonathan [name] to Dr. Mallie Mussel our bariatric psychologist.  Depression Screen Jonathan [name] had a strongly positive depression screening. Depression is commonly associated with obesity and often results in emotional eating behaviors. We will monitor this closely and work on CBT to help improve the non-hunger eating patterns.   Obesity  is currently in the action stage of change and his goal is to continue with weight loss efforts. I recommend Yoan begin the structured treatment plan as follows:  He has agreed to follow the Category 4 plan  Corwyn will do some gentle walking for weight loss and overall  health benefits. We discussed the following Behavioral Modification Strategies today: increase H2O intake, no skipping meals, keeping healthy foods in the home, better snacking choices, increasing lean protein intake, decreasing simple carbohydrates, increasing vegetables, decrease eating out, work on meal planning and easy cooking plans, emotional eating strategies, ways to avoid boredom eating and ways to avoid night time snacking Snacking ideas were given to patient today.   He was informed of the importance of frequent follow up visits to maximize his success with intensive lifestyle modifications for his multiple health conditions. He was informed we would discuss his lab results at his next visit unless there is a critical issue that needs to be addressed sooner. Dionis agreed to keep his next visit at the agreed upon time to discuss these results.  ALLERGIES: Allergies  Allergen Reactions  . Codeine     REACTION: emesis  . Cozaar [Losartan Potassium] Hives    MEDICATIONS: Current Outpatient Medications on File Prior to Visit  Medication Sig Dispense Refill  . amLODipine (NORVASC) 5 MG tablet Take 1 tablet (5 mg total) by mouth daily. 90 tablet 0  . Ascorbic Acid (VITAMIN C) 1000 MG tablet Take 1,000 mg by mouth daily.    [name]  atenolol-chlorthalidone (TENORETIC) 50-25 MG tablet TAKE 1 TABLET BY MOUTH EVERY DAY 90 tablet 2  . Cholecalciferol (VITAMIN D3) 250 MCG (10000 UT) TABS Take by mouth.    . Echinacea 500 MG CAPS Take by mouth.    [name] ibuprofen (ADVIL) 200 MG tablet Take 200 mg by mouth every 6 (six) hours as needed.    . zinc gluconate 50 MG tablet Take 50 mg by mouth daily.     No current facility-administered medications on file prior to visit.     PAST MEDICAL HISTORY: Past Medical History:  Diagnosis Date  . Anxiety   . Arthritis   . Asthma   . BPH (benign prostatic hyperplasia)   . Chest pain   . ED (erectile dysfunction)   . Elevated PSA    9.33  . Esophageal  reflux   . Hearing loss   . History of hip replacement   . History of knee replacement   . Hypertension   . Joint pain   . Lactose intolerance   . Numbness in both legs   . Pre-diabetes   . Prostate cancer [facility]) Jan 2016   prostatectomy  . Sleep apnea   . Vitamin D deficiency     PAST SURGICAL HISTORY: Past Surgical History:  Procedure Laterality Date  . CORONARY CALCIUM SCORE / CARDIAC CT ANGIOGRAM  10/24/2017   Cor Ca Score = 0 - Low risk. Cor CTA: only mild plaque noted in mRCA with mLAD intramyocardial bridging noted.  [name] FOOT SURGERY     bilateral foot  . NM MYOVIEW LTD  07/2007   Normal nuclear stress test.  Mild fixed defect in the anterior wall likely related to soft tissue attenuation.  Normal EF.  Fair exercise capacity.  [name] PROSTATE BIOPSY  06/13/2012   right lat mid gleason 3+3=6  . PROSTATECTOMY  2016  . TOTAL HIP ARTHROPLASTY  2008 &2007   Right and left    SOCIAL HISTORY: Social History   Tobacco Use  . Smoking status: Never Smoker  . Smokeless tobacco: Never Used  Substance Use Topics  . Alcohol use: No  . Drug use: No    FAMILY HISTORY: Family History  Problem Relation Age of Onset  . Hypertension Mother   . Colon cancer Mother   . Obesity Mother   . Prostate cancer Father 74       alive now age 31  . Asthma Father   . Heart disease Father        Pt is not sure of details (no MI)  . Alcoholism Father   . Obesity Father   . Lung cancer Maternal Grandmother        non-smoker  . Tuberculosis Brother     ROS: Review of Systems  Constitutional: Positive for malaise/fatigue.  HENT: Positive for hearing loss and sinus pain.        + Difficult or Painful Swallowing  Eyes: Positive for redness.  Respiratory: Positive for shortness of breath.   Cardiovascular: Negative for chest pain and orthopnea.       + Shortness of Breath on exertion + Calf or Leg Pain with Walking  Gastrointestinal: Negative for nausea and vomiting.       + Swallowing  Difficulty  Genitourinary: Positive for frequency.  Musculoskeletal: Negative for myalgias.       + Muscle or Joint Pain  Skin: Positive for itching.  Endo/Heme/Allergies:       Negative for hypoglycemia  Psychiatric/Behavioral: Positive for depression. Negative  for suicidal ideas.    PHYSICAL EXAM: Blood pressure 127/75, pulse 62, temperature 98.1 F (36.7 C), temperature source Oral, height 5\' 10"  (1.778 m), weight (!) 319 lb (144.7 kg), SpO2 97 %. Body mass index is 45.77 kg/m. Physical Exam Vitals signs reviewed.  Constitutional:      General: He is not in acute distress.    Appearance: Normal appearance. He is well-developed. He is obese.  HENT:     Head: Normocephalic and atraumatic.     Nose: Nose normal.  Eyes:     General: No scleral icterus.    Extraocular Movements:     Right eye: Normal extraocular motion.     Left eye: Normal extraocular motion.  Neck:     Musculoskeletal: Normal range of motion and neck supple.     Thyroid: No thyromegaly.  Cardiovascular:     Rate and Rhythm: Regular rhythm. Bradycardia present.  Pulmonary:     Effort: Pulmonary effort is normal. No respiratory distress.  Abdominal:     Palpations: Abdomen is soft.     Tenderness: There is no abdominal tenderness.  Musculoskeletal: Normal range of motion.     Comments: Range of Motion normal in all 4 extremities  Skin:    General: Skin is warm and dry.  Neurological:     Mental Status: He is alert and oriented to person, place, and time.  Psychiatric:        Behavior: Behavior normal.     RECENT LABS AND TESTS: BMET    Component Value Date/Time   NA 138 04/06/2018 0848   NA 141 10/18/2017 1413   K 3.4 (L) 04/06/2018 0848   CL 94 (L) 04/06/2018 0848   CO2 37 (H) 04/06/2018 0848   GLUCOSE 110 (H) 04/06/2018 0848   BUN 14 04/06/2018 0848   BUN 14 10/18/2017 1413   CREATININE 0.92 04/06/2018 0848   CALCIUM 9.5 04/06/2018 0848   GFRNONAA 89 10/18/2017 1413   GFRAA 103  10/18/2017 1413   Lab Results  Component Value Date   HGBA1C 6.5 04/06/2018   No results found for: INSULIN CBC    Component Value Date/Time   WBC 9.7 04/06/2018 0848   RBC 5.02 04/06/2018 0848   HGB 13.4 04/06/2018 0848   HCT 41.3 04/06/2018 0848   PLT 302.0 04/06/2018 0848   MCV 82.2 04/06/2018 0848   MCH 25.5 (L) 04/20/2011 0953   MCHC 32.5 04/06/2018 0848   RDW 17.4 (H) 04/06/2018 0848   LYMPHSABS 2.0 04/06/2018 0848   MONOABS 1.0 04/06/2018 0848   EOSABS 0.2 04/06/2018 0848   BASOSABS 0.0 04/06/2018 0848   Iron/TIBC/Ferritin/ %Sat    Component Value Date/Time   IRON 150 12/15/2011 1507   FERRITIN 9.2 (L) 12/15/2011 1507   IRONPCTSAT 35.3 12/15/2011 1507   Lipid Panel     Component Value Date/Time   CHOL 175 04/06/2018 0848   TRIG 149.0 04/06/2018 0848   HDL 43.50 04/06/2018 0848   CHOLHDL 4 04/06/2018 0848   VLDL 29.8 04/06/2018 0848   LDLCALC 102 (H) 04/06/2018 0848   Hepatic Function Panel     Component Value Date/Time   PROT 8.0 04/06/2018 0848   ALBUMIN 3.8 04/06/2018 0848   AST 14 04/06/2018 0848   ALT 14 04/06/2018 0848   ALKPHOS 62 04/06/2018 0848   BILITOT 0.5 04/06/2018 0848   BILIDIR 0.1 04/02/2016 0720   IBILI 0.3 04/17/2010 2157      Component Value Date/Time   TSH 3.05 04/06/2018 0848  TSH 1.63 02/16/2018 1655   TSH 3.70 04/02/2016 0720    ECG  shows NSR with a rate of 53 BPM INDIRECT CALORIMETER done today shows a VO2 of 313 and a REE of 2177.  His calculated basal metabolic rate is 2395 thus his basal metabolic rate is worse than expected.       OBESITY BEHAVIORAL INTERVENTION VISIT  Today's visit was # 1   Starting weight: 319 lbs Starting date: 08/28/2018 Today's weight : 319 lbs Today's date: 08/28/2018 Total lbs lost to date: 0    08/28/2018  Height 5\' 10"  (1.778 m)  Weight 319 lb (144.7 kg) (A)  BMI (Calculated) 45.77  BLOOD PRESSURE - SYSTOLIC 320  BLOOD PRESSURE - DIASTOLIC 75  Waist Measurement  56 inches    Body Fat % 42.6 %  Total Body Water (lbs) 138 lbs  RMR 2177     ASK: We discussed the diagnosis of obesity with Jonathan [name] today and Jonathan [name] agreed to give [name] permission to discuss obesity behavioral modification therapy today.  ASSESS: Tejon has the diagnosis of obesity and his BMI today is 45.77 Walt is in the action stage of change   ADVISE: Elia was educated on the multiple health risks of obesity as well as the benefit of weight loss to improve his health. He was advised of the need for long term treatment and the importance of lifestyle modifications to improve his current health and to decrease his risk of future health problems.  AGREE: Multiple dietary modification options and treatment options were discussed and  Dquan agreed to follow the recommendations documented in the above note.  ARRANGE: Prabhav was educated on the importance of frequent visits to treat obesity as outlined per CMS and USPSTF guidelines and agreed to schedule his next follow up appointment today.  Corey Skains, am acting as Location manager for [employer]. Owens Shark, DO  I have reviewed the above documentation for accuracy and completeness, and I agree with the above. -[name], DO

## 2018-08-29 ENCOUNTER — Encounter (INDEPENDENT_AMBULATORY_CARE_PROVIDER_SITE_OTHER): Payer: Self-pay | Admitting: Bariatrics

## 2018-08-29 LAB — COMPREHENSIVE METABOLIC PANEL
ALT: 20 IU/L (ref 0–44)
AST: 18 IU/L (ref 0–40)
Albumin/Globulin Ratio: 1.1 — ABNORMAL LOW (ref 1.2–2.2)
Albumin: 4 g/dL (ref 3.8–4.8)
Alkaline Phosphatase: 74 IU/L (ref 39–117)
BUN/Creatinine Ratio: 17 (ref 10–24)
BUN: 15 mg/dL (ref 8–27)
Bilirubin Total: 0.3 mg/dL (ref 0.0–1.2)
CO2: 27 mmol/L (ref 20–29)
Calcium: 9.4 mg/dL (ref 8.6–10.2)
Chloride: 90 mmol/L — ABNORMAL LOW (ref 96–106)
Creatinine, Ser: 0.86 mg/dL (ref 0.76–1.27)
GFR calc Af Amer: 108 mL/min/{1.73_m2} (ref >59)
GFR calc non Af Amer: 94 mL/min/{1.73_m2} (ref >59)
Globulin, Total: 3.7 g/dL (ref 1.5–4.5)
Glucose: 101 mg/dL — ABNORMAL HIGH (ref 65–99)
Potassium: 3.4 mmol/L — ABNORMAL LOW (ref 3.5–5.2)
Sodium: 136 mmol/L (ref 134–144)
Total Protein: 7.7 g/dL (ref 6.0–8.5)

## 2018-08-29 LAB — INSULIN, RANDOM: INSULIN: 46.3 u[IU]/mL — ABNORMAL HIGH (ref 2.6–24.9)

## 2018-08-29 LAB — HEMOGLOBIN A1C
Est. average glucose Bld gHb Est-mCnc: 140 mg/dL
Hgb A1c MFr Bld: 6.5 % — ABNORMAL HIGH (ref 4.8–5.6)

## 2018-08-29 LAB — VITAMIN D 25 HYDROXY (VIT D DEFICIENCY, FRACTURES): Vit D, 25-Hydroxy: 17.1 ng/mL — ABNORMAL LOW (ref 30.0–100.0)

## 2018-09-07 NOTE — Progress Notes (Signed)
Office: [phone]  /  Fax: [phone]    Date: September 11, 2018  Appointment Start Time: 3:04pm Duration: 35 minutes Provider: Glennie Isle, Psy.D. Type of Session: Intake for Individual Therapy  Location of Patient: Home Location of Provider: Provider's Home Type of Contact: Telepsychological Visit via [employer] Web[employer]  Informed Consent: This provider called Jonathan [name] at 3:00pm to assist with joining the Jonathan [name] appointment. Directions were provided and Jonathan [name] successfully joined the appointment at 3:04pm. Prior to proceeding with today's appointment, two pieces of identifying information were obtained from Jonathan [name] to verify identity. In addition, Jonathan [name]'s physical location at the time of this appointment was obtained. Jonathan [name] reported he was at home and provided the address. In the event of technical difficulties, Jonathan [name] shared a phone number he could be reached at. Jonathan [name] and this provider participated in today's telepsychological service. Also, Jonathan [name] denied anyone else being present in the room or on the WebEx appointment.   The provider's role was explained to Jonathan [name]. The provider reviewed and discussed issues of confidentiality, privacy, and limits therein (e.g., reporting obligations). In addition to verbal informed consent, written informed consent for psychological services was obtained from Jonathan [name] prior to the initial intake interview. Written consent included information concerning the practice, financial arrangements, and confidentiality and patients' rights. Since the clinic is not a 24/7 crisis center, mental health emergency resources were shared, and the provider explained MyChart, e-mail, voicemail, and/or other messaging systems should be utilized only for non-emergency reasons. This provider also explained that information obtained during appointments will be placed in Jonathan [name]'s medical record in a confidential manner and relevant information will be shared with other providers at  [facility] that he meets with for coordination of care. Jonathan [name] verbally acknowledged understanding of the aforementioned, and agreed to use mental health emergency resources discussed if needed. Moreover, Jonathan [name] agreed information may be shared with other [facility] providers as needed for coordination of care. By signing the service agreement document, Jonathan [name] provided written consent for coordination of care.   Prior to initiating telepsychological services, Jonathan [name] was provided with an informed consent document, which included the development of a safety plan (i.e., an emergency contact and emergency resources) in the event of an emergency/crisis. Jonathan [name] expressed understanding of the rationale of the safety plan and provided consent for this provider to reach out to his emergency contact in the event of an emergency/crisis.Jonathan [name] returned the completed consent form prior to today's appointment. This provider verbally reviewed the consent form during today's appointment prior to proceeding with the appointment. Jonathan [name] verbally acknowledged understanding that he is ultimately responsible for understanding his insurance benefits as it relates to reimbursement of telepsychological services. This provider also reviewed confidentiality, as it relates to telepsychological services, as well as the rationale for telepsychological services. More specifically, this provider's clinic is limiting in-person visits due to COVID-19. Therapeutic services will resume to in-person appointments once deemed appropriate. Jonathan [name] expressed understanding regarding the rationale for telepsychological services. In addition, this provider explained the telepsychological services informed consent document would be considered an addendum to the initial consent document/service agreement. Jonathan [name] verbally consented to proceed.   Chief Complaint/HPI: Jonathan [name] was referred by Dr. Jearld [name] due to depression with  emotional eating behaviors. Per the note for the visit with Dr. Jearld [name] on Aug 28, 2018, "Jonathan [name] is struggling with emotional eating and using food for comfort to the extent that it is negatively impacting his health. He often snacks when he is not hungry.  Jonathan [name] sometimes feels he is out of control and then feels guilty that he made poor food choices. Jonathan [name] has some binging behaviors. He is attempting to work on behavior modification techniques to help reduce his emotional eating. He shows no sign of suicidal or homicidal ideations. PHQ-9 Score is 19." Jonathan [name] disclosed he started gaining weight after age 75 and his heaviest weight ever was 318 lbs. He noted he will start cooking for himself. Furthermore, Jonathan [name] reported experiencing the following: significant food cravings issues , snacking frequently in the evenings, frequently drinking liquids with calories, frequently making poor food choices, frequently eating larger portions than normal , binge eating behaviors, struggling with emotional eating and waking up frquently in the middle of the night to eat.   During today's appointment, Jonathan [name] reported the onset of emotional eating was likely in childhood. He described the frequency as every day and he described craving "any thing." Jonathan [name] denied a history of restricting, purging and engagement in other compensatory strategies, and has never been diagnosed with an eating disorder. He also denied receiving treatment for emotional eating. Prior to starting with the clinic, he reported engaging in binge eating. This was explored and Jonathan [name] reported, "I ate what I wanted to, when I wanted to." Jonathan [name] described himself as "very disciplined" and he noted, "I stuck to it [referring to the structured meal plan] to a T, 100%." He shared he lost five pounds.   Jonathan [name] denied any other problems of concerns. He was unable to identify what makes emotional eating worse. However, he indicated the meal plan helps make  emotional eating better. He denied episodes of emotional eating since starting with the clinic and noted he has not consumed sweets recently. Buren noted, "My main problem was I didn't know what to eat and that's what I need to learn." He denied his eating habits impacting relationships and recreational activities. Prior to starting with the clinic, Jonathan [name] recalled consuming approximately three sodas (regular or diet) every day. Since starting with the clinic, he noted he only consumes water. Furthermore, Jonathan [name] was verbally administered a questionnaire assessing various behaviors related to emotional eating. Jonathan [name] endorsed the following: overeat when you are celebrating, experience food cravings on a regular basis, find food is comforting to you, overeat when you are angry or upset, overeat when you are worried about something, not worry about what you eat when you are in a good mood, eat to help you stay awake and eat as a reward.  Mental Status Examination:  Appearance: neat Behavior: cooperative Mood: euthymic Affect: mood congruent Speech: normal in rate, volume, and tone Eye Contact: appropriate Psychomotor Activity: appropriate Thought Process: linear, logical, and goal directed  Content/Perceptual Disturbances: denies suicidal and homicidal ideation, plan, and intent and no hallucinations, delusions, bizarre thinking or behavior reported or observed Orientation: time, person, place and purpose of appointment Cognition/Sensorium: memory, attention, language, and fund of knowledge intact  Insight: good Judgment: good  Family & Psychosocial History: Jonathan [name] reported he has been married for 23 years and has one son (age 42). He indicated he is not currently employed and noted, "I had to retire early. 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 Additionally, Jonathan [name] shared his highest level of education obtained is an associate's degree. Currently, Jonathan [name]'s social support  system consists of friends and wife. Moreover, Jonathan [name] stated he resides with his wife and son.   Medical History:  Past Medical History:  Diagnosis Date   Anxiety    Arthritis  Asthma    BPH (benign prostatic hyperplasia)    Chest pain    Jonathan [name] (erectile dysfunction)    Elevated PSA    9.33   Esophageal reflux    Hearing loss    History of hip replacement    History of knee replacement    Hypertension    Joint pain    Lactose intolerance    Numbness in both legs    Pre-diabetes    Prostate cancer [name]) Jan 2016   prostatectomy   Sleep apnea    Vitamin D deficiency    Past Surgical History:  Procedure Laterality Date   CORONARY CALCIUM SCORE / CARDIAC CT ANGIOGRAM  10/24/2017   Cor Ca Score = 0 - Low risk. Cor CTA: only mild plaque noted in mRCA with mLAD intramyocardial bridging noted.   FOOT SURGERY     bilateral foot   NM MYOVIEW LTD  07/2007   Normal nuclear stress test.  Mild fixed defect in the anterior wall likely related to soft tissue attenuation.  Normal EF.  Fair exercise capacity.   PROSTATE BIOPSY  06/13/2012   right lat mid gleason 3+3=6   PROSTATECTOMY  2016   TOTAL HIP ARTHROPLASTY  2008 &2007   Right and left   Current Outpatient Medications on File Prior to Visit  Medication Sig Dispense Refill   amLODipine (NORVASC) 5 MG tablet Take 1 tablet (5 mg total) by mouth daily. 90 tablet 0   Ascorbic Acid (VITAMIN C) 1000 MG tablet Take 1,000 mg by mouth daily.     atenolol-chlorthalidone (TENORETIC) 50-25 MG tablet TAKE 1 TABLET BY MOUTH EVERY DAY 90 tablet 2   Cholecalciferol (VITAMIN D3) 250 MCG (10000 UT) TABS Take by mouth.     Echinacea 500 MG CAPS Take by mouth.     ibuprofen (ADVIL) 200 MG tablet Take 200 mg by mouth every 6 (six) hours as needed.     potassium chloride (K-DUR) 10 MEQ tablet Take 1 tablet (10 mEq total) by mouth daily. 30 tablet 0   Vitamin D, Ergocalciferol, (DRISDOL) 1.25 MG (50000 UT) CAPS capsule  Take 1 capsule (50,000 Units total) by mouth every 7 (seven) days. 4 capsule 0   zinc gluconate 50 MG tablet Take 50 mg by mouth daily.     No current facility-administered medications on file prior to visit.   Arnav denied a history of head injuries and loss of consciousness.   Mental Health History: Jonathan [name] denied a history of mental health treatment, including therapy, psychotropic medications, and hospitalizations. He has never met with a psychiatrist. Jonathan [name] also denied a family history of mental health concerns. Additionally, Jonathan [name] denied a trauma history, including psychological, physical  and sexual abuse, as well as neglect. Notably, chart review revealed a history of anxiety. Jonathan [name] described his typical mood as "pretty easy going, low key." He denied consuming alcohol, tobacco use, and illicit/recreational substance use. He previously consumed caffeine in the form of soda. Jonathan [name] denied experiencing the following: hopelessness, memory concerns, obsessions and compulsions, hallucinations and delusions, paranoia, mania, worry thoughts and decreased motivation. He also denied history of and current suicidal ideation, plan, and intent; history of and current homicidal ideation, plan, and intent; and history of and current engagement in self-harm. Notably, Jonathan [name] endorsed item 9 (i.e., "Do you feel that your weight problem is so hopeless that sometimes life doesn't seem worth living?") on the modified PHQ-9 during his initial appointment with Dr. Jearld [name] on Aug 28, 2018. Kamir explained he  endorsed that item due to hopelessness related to weight and denied endorsement due to suicidal ideation. He noted, "That's when I knew I had to get some help. I was getting frustrated with gaining weight." He added, "I'm in love with me."  The following strengths were reported by Jonathan [name]: leadership, organization, and very disciplined. The following strengths were observed by this provider: ability to express  thoughts and feelings during the therapeutic session, ability to establish and benefit from a therapeutic relationship, ability to learn and practice coping skills, willingness to work toward established goal(s) with the clinic and ability to engage in reciprocal conversation.  Legal History: Harless denied a history of legal involvement.   Structured Assessment Results: The Patient Health Questionnaire-9 (PHQ-9) is a self-report measure that assesses symptoms and severity of depression over the [name] of the last two weeks. Krystian obtained a score of 2 suggesting minimal depression. Syaire finds the endorsed symptoms to be not difficult at all. Decreased interest 0  Down, depressed, hopeless 0  Altered sleeping 1  Tired, decreased energy 1  Change in appetite 0  Feeling bad or failure about yourself 0  Trouble concentrating 0  Moving slowly or fidgety/restless 0  Suicidal thoughts 0  PHQ-9 Score 2    The Generalized Anxiety Disorder-7 (GAD-7) is a brief self-report measure that assesses symptoms of anxiety over the [name] of the last two weeks. Johny obtained a score of 1 suggesting minimal anxiety. Cadon finds the endorsed symptoms to be not difficult at all. Nervous, anxious, on edge 1  Control/stop worrying 0  Worrying too much- different things 0  Trouble relaxing 0  Restless 0  Easily annoyed or irritable 0  Afraid-awful might happen 0  GAD-7 Score 1   Interventions: A chart review was conducted prior to the clinical intake interview. The PHQ-9, and GAD-7 were verbally  administered as well as a Mood and Food questionnaire to assess various behaviors related to emotional eating. Throughout session, empathic reflections and validation was provided. Continuing treatment with this provider was discussed and a treatment goal was established. Psychoeducation regarding emotional versus physical hunger was provided. [place] was sent a handout via e-mail to utilize between now and the next  appointment to increase awareness of hunger patterns and subsequent eating. Jahzeel provided verbal consent during today's appointment for this provider to send the handout via e-mail. Jadden expressed concern regarding his ability to be successful while traveling to [place] for his father's funeral. Thus, this provider discussed the utilization of the provided handout to assist with making better choices.    Provisional DSM-5 Diagnosis: 311 (F32.8) Other Specified Depressive Disorder, Emotional Eating Behaviors  Plan: Key appears able and willing to participate as evidenced by collaboration on a treatment goal, engagement in reciprocal conversation, and asking questions as needed for clarification. The next appointment will be scheduled in two weeks, which will be via [employer]. The following treatment goal was established: decrease emotional eating. Once this provider's office resumes in-person appointments and it is deemed appropriate, Arrie will be notified. For the aforementioned goal, Naji can benefit from biweekly individual therapy sessions that are brief in duration for approximately four to six sessions. The treatment modality will be individual therapeutic services, including an eclectic therapeutic approach utilizing techniques from Cognitive Behavioral Therapy, Patient Centered Therapy, Dialectical Behavior Therapy, Acceptance and Commitment Therapy, Interpersonal Therapy, and Cognitive Restructuring. Therapeutic approach will include various interventions as appropriate, such as validation, support, mindfulness, thought defusion, reframing, psychoeducation, values assessment, and role  playing. This provider will regularly review the treatment plan and medical chart to keep informed of status changes. Autry expressed understanding and agreement with the initial treatment plan of care.

## 2018-09-11 ENCOUNTER — Ambulatory Visit (INDEPENDENT_AMBULATORY_CARE_PROVIDER_SITE_OTHER): Payer: Medicare Other | Admitting: Psychology

## 2018-09-11 ENCOUNTER — Encounter (INDEPENDENT_AMBULATORY_CARE_PROVIDER_SITE_OTHER): Payer: Self-pay | Admitting: Bariatrics

## 2018-09-11 ENCOUNTER — Other Ambulatory Visit: Payer: Self-pay

## 2018-09-11 ENCOUNTER — Ambulatory Visit (INDEPENDENT_AMBULATORY_CARE_PROVIDER_SITE_OTHER): Payer: Medicare Other | Admitting: Bariatrics

## 2018-09-11 VITALS — BP 115/69 | HR 60 | Temp 98.1°F | Ht 70.0 in | Wt 314.0 lb

## 2018-09-11 DIAGNOSIS — G4733 Obstructive sleep apnea (adult) (pediatric): Secondary | ICD-10-CM | POA: Diagnosis not present

## 2018-09-11 DIAGNOSIS — F3289 Other specified depressive episodes: Secondary | ICD-10-CM | POA: Diagnosis not present

## 2018-09-11 DIAGNOSIS — E876 Hypokalemia: Secondary | ICD-10-CM

## 2018-09-11 DIAGNOSIS — E119 Type 2 diabetes mellitus without complications: Secondary | ICD-10-CM

## 2018-09-11 DIAGNOSIS — E559 Vitamin D deficiency, unspecified: Secondary | ICD-10-CM | POA: Diagnosis not present

## 2018-09-11 DIAGNOSIS — Z9189 Other specified personal risk factors, not elsewhere classified: Secondary | ICD-10-CM

## 2018-09-11 DIAGNOSIS — Z6841 Body Mass Index (BMI) 40.0 and over, adult: Secondary | ICD-10-CM

## 2018-09-11 DIAGNOSIS — I10 Essential (primary) hypertension: Secondary | ICD-10-CM

## 2018-09-11 DIAGNOSIS — Z9989 Dependence on other enabling machines and devices: Secondary | ICD-10-CM

## 2018-09-11 MED ORDER — POTASSIUM CHLORIDE ER 10 MEQ PO TBCR: 10.0000 meq | EXTENDED_RELEASE_TABLET | Freq: Every day | ORAL | 0 refills | Status: DC

## 2018-09-11 MED ORDER — VITAMIN D (ERGOCALCIFEROL) 1.25 MG (50000 UNIT) PO CAPS: 50000.0000 [IU] | ORAL_CAPSULE | ORAL | 0 refills | Status: DC

## 2018-09-12 DIAGNOSIS — E669 Obesity, unspecified: Secondary | ICD-10-CM | POA: Insufficient documentation

## 2018-09-12 DIAGNOSIS — G4733 Obstructive sleep apnea (adult) (pediatric): Secondary | ICD-10-CM | POA: Insufficient documentation

## 2018-09-12 DIAGNOSIS — Z9989 Dependence on other enabling machines and devices: Secondary | ICD-10-CM | POA: Insufficient documentation

## 2018-09-12 NOTE — Progress Notes (Signed)
**Note Jonathan [name]-Identified via Obfuscation** Office: [phone]  /  Fax: [phone]   HPI:   Chief Complaint: OBESITY Jonathan [name] is here to discuss his progress with his obesity treatment plan. He is on the Category 4 plan and is following his eating plan approximately 100 % of the time. He states he is exercising 0 minutes 0 times per week. Valerie states that he is doing well and he has lost five pounds. He did not struggle with the plan. His weight is (!) 314 lb (142.4 kg) today and has had a weight loss of 5 pounds over a period of 2 weeks since his last visit. He has lost 5 lbs since starting treatment with [name].  OSA (obstructive sleep apnea) on CPAP Jonathan [name] has a diagnosis of obstructive sleep apnea. He is doing well and is restful on CPAP.  Hypertension Jonathan [name] is a 63 y.o. male with hypertension. He is taking Atenolol-Chlorthalidone. Jonathan [name] denies chest pain or shortness of breath on exertion. He is working weight loss to help control his blood pressure with the goal of decreasing his risk of heart attack and stroke. Jonathan [name] blood pressure is well controlled.  [name] has a diagnosis of vitamin D deficiency. His last vitamin D level was at 17.1 Jonathan [name] is currently taking vit D and denies nausea, vomiting or muscle weakness.  At risk for osteopenia and osteoporosis Jonathan [name] is at higher risk of osteopenia and osteoporosis due to vitamin D deficiency.   Diabetes II Jonathan [name] has a diagnosis of diabetes type II. Jonathan [name] denies polyphagia. Last A1c was at 6.5 and last insulin level was at 46.3 He has been working on intensive lifestyle modifications including diet, exercise, and weight loss to help control his blood glucose levels.   Low Potassium Jonathan [name] has a diagnosis of hypokalemia and his last potassium level was at 3.4 He is on Atenolol-Chlorthalidone.   ASSESSMENT AND PLAN:  OSA on CPAP  Essential hypertension  Vitamin D deficiency - Plan: Vitamin D, Ergocalciferol, (DRISDOL) 1.25 MG  (50000 UT) CAPS capsule  Type 2 diabetes mellitus without complication, without long-term current use of insulin (HCC)  Hypokalemia - Plan: potassium chloride (K-DUR) 10 MEQ tablet  At risk for osteoporosis  Class 3 severe obesity with serious comorbidity and body mass index (BMI) of 45.0 to 49.9 in adult, unspecified obesity type (Jonathan [name])  PLAN:  OSA (obstructive sleep apnea) on CPAP Jonathan [name] will continue his use of CPAP and will follow up at the agreed upon time.  Hypertension We discussed sodium restriction, working on healthy weight loss, and a regular exercise program as the means to achieve improved blood pressure control. Jonathan [name] agreed with this plan and agreed to follow up as directed. We will continue to monitor his blood pressure as well as his progress with the above lifestyle modifications. He will continue his medications as prescribed and will watch for signs of hypotension as he continues his lifestyle modifications.  [name] was informed that low vitamin D levels contributes to fatigue and are associated with obesity, breast, and colon cancer. He agrees to continue to take prescription Vit D @50 ,000 IU every week #4 with no refills and will follow up for routine testing of vitamin D, at least 2-3 times per year. He was informed of the risk of over-replacement of vitamin D and agrees to not increase his dose unless he discusses this with [name] first. Jonathan [name] will follow up as directed.  At risk for osteopenia and osteoporosis Jonathan [name] was given  extended  (15 minutes) osteoporosis prevention counseling today. Jonathan [name] is at risk for osteopenia and osteoporosis due to his vitamin D deficiency. He was encouraged to take his vitamin D and follow his higher calcium diet and increase strengthening exercise to help strengthen his bones and decrease his risk of osteopenia and osteoporosis.  Diabetes II Jonathan [name] has been given extensive diabetes education by myself today  including ideal fasting and post-prandial blood glucose readings, individual ideal Hgb A1c goals and hypoglycemia prevention. We discussed the importance of good blood sugar control to decrease the likelihood of diabetic complications such as nephropathy, neuropathy, limb loss, blindness, coronary artery disease, and death. We discussed the importance of intensive lifestyle modification including diet, exercise and weight loss as the first line treatment for diabetes. Jonathan [name] will continue to work on his weight loss. He will work on decreasing simple carbohydrates in his diet. Jonathan [name] will follow up at the agreed upon time.  Low Potassium Jonathan [name] agrees to take Potassium 10 mEq once daily #30 with no refills and follow up as directed.  Obesity Jonathan [name] is currently in the action stage of change. As such, his goal is to continue with weight loss efforts He has agreed to follow the Category 4 plan Jonathan [name] will begin resistance band exercises for weight loss and overall health benefits. We discussed the following Behavioral Modification Strategies today: increase H2O intake to 64 ounces, no skipping meals, keeping healthy foods in the home, increasing lean protein intake, decreasing simple carbohydrates, increasing vegetables, decrease eating out and work on meal planning and easy cooking plans  Jonathan [name] has agreed to follow up with our clinic in 2 weeks. He was informed of the importance of frequent follow up visits to maximize his success with intensive lifestyle modifications for his multiple health conditions.  ALLERGIES: Allergies  Allergen Reactions  . Codeine     REACTION: emesis  . Cozaar [Losartan Potassium] Hives    MEDICATIONS: Current Outpatient Medications on File Prior to Visit  Medication Sig Dispense Refill  . amLODipine (NORVASC) 5 MG tablet Take 1 tablet (5 mg total) by mouth daily. 90 tablet 0  . Ascorbic Acid (VITAMIN C) 1000 MG tablet Take 1,000 mg by mouth daily.    [name]  atenolol-chlorthalidone (TENORETIC) 50-25 MG tablet TAKE 1 TABLET BY MOUTH EVERY DAY 90 tablet 2  . Cholecalciferol (VITAMIN D3) 250 MCG (10000 UT) TABS Take by mouth.    . Echinacea 500 MG CAPS Take by mouth.    [name] ibuprofen (ADVIL) 200 MG tablet Take 200 mg by mouth every 6 (six) hours as needed.    . zinc gluconate 50 MG tablet Take 50 mg by mouth daily.     No current facility-administered medications on file prior to visit.     PAST MEDICAL HISTORY: Past Medical History:  Diagnosis Date  . Anxiety   . Arthritis   . Asthma   . BPH (benign prostatic hyperplasia)   . Chest pain   . ED (erectile dysfunction)   . Elevated PSA    9.33  . Esophageal reflux   . Hearing loss   . History of hip replacement   . History of knee replacement   . Hypertension   . Joint pain   . Lactose intolerance   . Numbness in both legs   . Pre-diabetes   . Prostate cancer Jonathan [name]) Jan 2016   prostatectomy  . Sleep apnea   . Vitamin D deficiency     PAST SURGICAL HISTORY: Past Surgical History:  Procedure Laterality Date  . CORONARY CALCIUM SCORE / CARDIAC CT ANGIOGRAM  10/24/2017   Cor Ca Score = 0 - Low risk. Cor CTA: only mild plaque noted in mRCA with mLAD intramyocardial bridging noted.  [name] FOOT SURGERY     bilateral foot  . NM MYOVIEW LTD  07/2007   Normal nuclear stress test.  Mild fixed defect in the anterior wall likely related to soft tissue attenuation.  Normal EF.  Fair exercise capacity.  [name] PROSTATE BIOPSY  06/13/2012   right lat mid gleason 3+3=6  . PROSTATECTOMY  2016  . TOTAL HIP ARTHROPLASTY  2008 &2007   Right and left    SOCIAL HISTORY: Social History   Tobacco Use  . Smoking status: Never Smoker  . Smokeless tobacco: Never Used  Substance Use Topics  . Alcohol use: No  . Drug use: No    FAMILY HISTORY: Family History  Problem Relation Age of Onset  . Hypertension Mother   . Colon cancer Mother   . Obesity Mother   . Prostate cancer Father 67       alive now  age 25  . Asthma Father   . Heart disease Father        Pt is not sure of details (no MI)  . Alcoholism Father   . Obesity Father   . Lung cancer Maternal Grandmother        non-smoker  . Tuberculosis Brother     ROS: Review of Systems  Constitutional: Positive for weight loss.  Respiratory: Negative for shortness of breath (on exertion).   Cardiovascular: Negative for chest pain.  Gastrointestinal: Negative for nausea and vomiting.  Musculoskeletal:       Negative for muscle weakness  Endo/Heme/Allergies:       Negative for polyphagia    PHYSICAL EXAM: Blood pressure 115/69, pulse 60, temperature 98.1 F (36.7 C), temperature source Oral, height 5\' 10"  (1.778 m), weight (!) 314 lb (142.4 kg), SpO2 97 %. Body mass index is 45.05 kg/m. Physical Exam  RECENT LABS AND TESTS: BMET    Component Value Date/Time   NA 136 08/28/2018 1114   K 3.4 (L) 08/28/2018 1114   CL 90 (L) 08/28/2018 1114   CO2 27 08/28/2018 1114   GLUCOSE 101 (H) 08/28/2018 1114   GLUCOSE 110 (H) 04/06/2018 0848   BUN 15 08/28/2018 1114   CREATININE 0.86 08/28/2018 1114   CALCIUM 9.4 08/28/2018 1114   GFRNONAA 94 08/28/2018 1114   GFRAA 108 08/28/2018 1114   Lab Results  Component Value Date   HGBA1C 6.5 (H) 08/28/2018   HGBA1C 6.5 04/06/2018   HGBA1C 6.0 04/02/2016   HGBA1C 6.1 04/09/2014   Lab Results  Component Value Date   INSULIN 46.3 (H) 08/28/2018   CBC    Component Value Date/Time   WBC 9.7 04/06/2018 0848   RBC 5.02 04/06/2018 0848   HGB 13.4 04/06/2018 0848   HCT 41.3 04/06/2018 0848   PLT 302.0 04/06/2018 0848   MCV 82.2 04/06/2018 0848   MCH 25.5 (L) 04/20/2011 0953   MCHC 32.5 04/06/2018 0848   RDW 17.4 (H) 04/06/2018 0848   LYMPHSABS 2.0 04/06/2018 0848   MONOABS 1.0 04/06/2018 0848   EOSABS 0.2 04/06/2018 0848   BASOSABS 0.0 04/06/2018 0848   Iron/TIBC/Ferritin/ %Sat    Component Value Date/Time   IRON 150 12/15/2011 1507   FERRITIN 9.2 (L) 12/15/2011 1507    IRONPCTSAT 35.3 12/15/2011 1507   Lipid Panel  Component Value Date/Time   CHOL 175 04/06/2018 0848   TRIG 149.0 04/06/2018 0848   HDL 43.50 04/06/2018 0848   CHOLHDL 4 04/06/2018 0848   VLDL 29.8 04/06/2018 0848   LDLCALC 102 (H) 04/06/2018 0848   Hepatic Function Panel     Component Value Date/Time   PROT 7.7 08/28/2018 1114   ALBUMIN 4.0 08/28/2018 1114   AST 18 08/28/2018 1114   ALT 20 08/28/2018 1114   ALKPHOS 74 08/28/2018 1114   BILITOT 0.3 08/28/2018 1114   BILIDIR 0.1 04/02/2016 0720   IBILI 0.3 04/17/2010 2157      Component Value Date/Time   TSH 3.05 04/06/2018 0848   TSH 1.63 02/16/2018 1655   TSH 3.70 04/02/2016 0720     Ref. Range 08/28/2018 11:14  Vitamin D, 25-Hydroxy Latest Ref Range: 30.0 - 100.0 ng/mL 17.1 (L)    OBESITY BEHAVIORAL INTERVENTION VISIT  Today's visit was # 2   Starting weight: 319 lbs Starting date: 08/28/2018 Today's weight : 314 lbs  Today's date: 09/11/2018 Total lbs lost to date: 5    09/11/2018  Height 5\' 10"  (1.778 m)  Weight 314 lb (142.4 kg) (A)  BMI (Calculated) 45.05  BLOOD PRESSURE - SYSTOLIC 456  BLOOD PRESSURE - DIASTOLIC 69   Body Fat % 42 %  Total Body Water (lbs) 136 lbs    ASK: We discussed the diagnosis of obesity with Jonathan [name] today and Nickolas agreed to give [name] permission to discuss obesity behavioral modification therapy today.  ASSESS: Tagg has the diagnosis of obesity and his BMI today is 45.05 Coston is in the action stage of change   ADVISE: Grafton was educated on the multiple health risks of obesity as well as the benefit of weight loss to improve his health. He was advised of the need for long term treatment and the importance of lifestyle modifications to improve his current health and to decrease his risk of future health problems.  AGREE: Multiple dietary modification options and treatment options were discussed and  Riggin agreed to follow the recommendations documented in the  above note.  ARRANGE: Xyon was educated on the importance of frequent visits to treat obesity as outlined per CMS and USPSTF guidelines and agreed to schedule his next follow up appointment today.  Corey Skains, am acting as Location manager for [employer]. Owens Shark, DO  I have reviewed the above documentation for accuracy and completeness, and I agree with the above. -[name], DO

## 2018-09-21 NOTE — Progress Notes (Signed)
Office: [phone]  /  Fax: [phone]    Date: September 25, 2018   Appointment Start Time: 10:00am Duration: 22 minutes Provider: Glennie Isle, Psy.D. Type of Session: Individual Therapy  Location of Patient: Home Location of Provider: Provider's Home Type of Contact: Telepsychological Visit via [employer] WebEx   Session Content: Jonathan [name] is a 63 y.o. male presenting via [place] for a follow-up appointment to address the previously established treatment goal of decreasing emotional eating. Today's appointment was a telepsychological visit, as this provider's clinic is seeing a limited number of patients for in-person visits due to COVID-19. Therapeutic services will resume to in-person appointments once deemed appropriate. Jonathan [name] expressed understanding regarding the rationale for telepsychological services, and provided verbal consent for today's appointment. Prior to proceeding with today's appointment, Jonathan [name] physical location at the time of this appointment was obtained. Jonathan [name] reported he was at home and provided the address. In the event of technical difficulties, Jonathan [name] shared a phone number he could be reached at. Jonathan [name] and this provider participated in today's telepsychological service. Also, Barnabas denied anyone else being present in the room or on the WebEx appointment.  This provider conducted a brief check-in and verbally administered the PHQ-9 and GAD-7. Jonathan [name] shared "ongoing celebration" for his father. He also discussed following the structured meal plan, and denied episodes of emotional eating since the last appointment with this provider. Regarding the handout for physical versus emotional handout, Jonathan [name] reported he "glanced over it." This provider explored what stood out to him. He noted, "Nothing. It looks like it is for people who had problems with emotions and eating." He added, "I'm very disciplined. This part for me is a waste of time." This was explored further, and he  explained all he "needed" structured meal plan. Nevertheless, he was receptive to discussing triggers for emotional eating. Thus, psychoeducation regarding triggers for emotional eating was provided. This provider also discussed behavioral strategies for specific triggers, such as placing the utensil down when conversing to avoid mindless eating. This provider sent Jonathan [name] a MyChart message with the handout for triggers for emotional eating. Prior to sending the message, this provider explained the message would be visible to all providers, as it would be part of the electronic medical record. Jonathan [name] verbally acknowledged understanding, and verbally consented to this provider sending the MyChart message. Jonathan [name] of today's appointment focused on Jonathan [name] sharing about his motivation to follow the structured meal plan and lose weight. He reflected on "letting" himself go after he had to "take an early retirement." He recalled thinking, "What am I going to do now? Food used to be a comfort." Nonetheless, he noted that was in the past and now he is focused on his health. Overall, Jonathan [name] was receptive to today's session as evidenced by openness to sharing, responsiveness to feedback, and willingness to discuss triggers for emotional eating.  Mental Status Examination:  Appearance: neat Behavior: cooperative Mood: euthymic Affect: mood congruent Speech: normal in rate, volume, and tone Eye Contact: appropriate Psychomotor Activity: appropriate Thought Process: linear, logical, and goal directed  Content/Perceptual Disturbances: denies suicidal and homicidal ideation, plan, and intent and no hallucinations, delusions, bizarre thinking or behavior reported or observed Orientation: time, person, place and purpose of appointment Cognition/Sensorium: memory, attention, language, and fund of knowledge intact  Insight: good Judgment: good  Structured Assessment Results: The Patient Health Questionnaire-9 (PHQ-9)  is a self-report measure that assesses symptoms and severity of depression over the course of the last two weeks. Jonathan [name] obtained a score  of 0. Decreased interest 0  Down, depressed, hopeless 0  Altered sleeping 0  Tired, decreased energy 0  Change in appetite 0  Feeling bad or failure about yourself 0  Trouble concentrating 0  Moving slowly or fidgety/restless 0  Suicidal thoughts 0  PHQ-9 Score 0    The Generalized Anxiety Disorder-7 (GAD-7) is a brief self-report measure that assesses symptoms of anxiety over the course of the last two weeks. Jonathan [name] obtained a score of 0. Nervous, anxious, on edge 0  Control/stop worrying 0  Worrying too much- different things 0  Trouble relaxing 0  Restless 0  Easily annoyed or irritable 0  Afraid-awful might happen 0  GAD-7 Score 0   Interventions:  Conducted a brief chart review Verbal administration of PHQ-9 and GAD-7 for symptom monitoring Provided empathic reflections and validation Reviewed content from the previous session Psychoeducation provided regarding triggers for emotional eating Provided positive reinforcement Focused on rapport building Employed supportive psychotherapy interventions to facilitate reduced distress, and to improve coping skills with identified stressors  DSM-5 Diagnosis: 311 (F32.8) Other Specified Depressive Disorder, Emotional Eating Behaviors  Treatment Goal & Progress: During the initial appointment with this provider, the following treatment goal was established: decrease emotional eating. Progress is limited, as Jonathan [name] just began treatment with this provider. He discussed motivation to continue with the structured meal plan and denied episodes of emotional eating since the last appointment with this provider.  Plan: At this time, Jonathan [name] declined future appointments with this provider, but acknowledged understanding that he may request to re-initiate services as he continues with the clinic.

## 2018-09-25 ENCOUNTER — Other Ambulatory Visit: Payer: Self-pay

## 2018-09-25 ENCOUNTER — Ambulatory Visit (INDEPENDENT_AMBULATORY_CARE_PROVIDER_SITE_OTHER): Payer: Medicare Other | Admitting: Psychology

## 2018-09-25 ENCOUNTER — Encounter (INDEPENDENT_AMBULATORY_CARE_PROVIDER_SITE_OTHER): Payer: Self-pay

## 2018-09-25 DIAGNOSIS — F3289 Other specified depressive episodes: Secondary | ICD-10-CM

## 2018-09-27 ENCOUNTER — Encounter (INDEPENDENT_AMBULATORY_CARE_PROVIDER_SITE_OTHER): Payer: Self-pay | Admitting: Bariatrics

## 2018-09-27 ENCOUNTER — Ambulatory Visit (INDEPENDENT_AMBULATORY_CARE_PROVIDER_SITE_OTHER): Payer: Medicare Other | Admitting: Bariatrics

## 2018-09-27 ENCOUNTER — Other Ambulatory Visit: Payer: Self-pay

## 2018-09-27 VITALS — BP 118/72 | HR 60 | Temp 97.8°F | Ht 70.0 in | Wt 307.0 lb

## 2018-09-27 DIAGNOSIS — Z9189 Other specified personal risk factors, not elsewhere classified: Secondary | ICD-10-CM

## 2018-09-27 DIAGNOSIS — Z6841 Body Mass Index (BMI) 40.0 and over, adult: Secondary | ICD-10-CM

## 2018-09-27 DIAGNOSIS — F3289 Other specified depressive episodes: Secondary | ICD-10-CM

## 2018-09-27 DIAGNOSIS — K5909 Other constipation: Secondary | ICD-10-CM

## 2018-09-27 DIAGNOSIS — K760 Fatty (change of) liver, not elsewhere classified: Secondary | ICD-10-CM | POA: Diagnosis not present

## 2018-09-27 DIAGNOSIS — E559 Vitamin D deficiency, unspecified: Secondary | ICD-10-CM | POA: Diagnosis not present

## 2018-09-28 NOTE — Progress Notes (Signed)
Office: [phone]  /  Fax: [phone]   HPI:   Chief Complaint: OBESITY Jonathan [name] is here to discuss his progress with his obesity treatment plan. He is on the Category 4 plan and is following his eating plan approximately 100 % of the time. He states he is walking and doing stretch band exercises 15 minutes 2 to 3 times per week. Jonathan [name] is doing well and he has lost 7 pounds, since his last visit. He is not struggling of stress eating or appetite. His weight is (!) 307 lb (139.3 kg) today and has had a weight loss of 7 pounds over a period of 2 weeks since his last visit. He has lost 12 lbs since starting treatment with [name].  Fatty Liver Jonathan [name] has a diagnosis of fatty liver. His BMI is over 40. He denies abdominal pain.  [name] has a diagnosis of vitamin D deficiency. He is currently taking vit D and denies nausea, vomiting or muscle weakness.  At risk for osteopenia and osteoporosis Jonathan [name] is at higher risk of osteopenia and osteoporosis due to vitamin D deficiency.   Constipation Jonathan [name] notes constipation for the last few weeks, worse since attempting weight loss. He states BM are less frequent and are not hard and painful. He denies incomplete emptying.  Depression with emotional eating behaviors Jonathan [name] is struggling with emotional eating and using food for comfort to the extent that it is negatively impacting his health. He often snacks when he is not hungry. Jonathan [name] sometimes feels he is out of control and then feels guilty that he made poor food choices. Jonathan [name] is seeing Dr. Mallie Mussel our bariatric psychologist. He has been working on behavior modification techniques to help reduce his emotional eating and has been somewhat successful. He shows no sign of suicidal or homicidal ideations.  ASSESSMENT AND PLAN:  Fatty liver  Vitamin D deficiency  Other constipation  Other depression - with emotional eating   At risk for osteoporosis  Class 3 severe  obesity with serious comorbidity and body mass index (BMI) of 45.0 to 49.9 in adult, unspecified obesity type ([place])  PLAN:  Fatty Liver Jonathan [name] will increase exercise (cardio and resistance). Weight loss 5 to 10% of body weight and will move toward getting 150 minutes of exercise weekly ( cardio and/or resistance ) .   [name] was informed that low vitamin D levels contributes to fatigue and are associated with obesity, breast, and colon cancer. He will continue to take prescription Vit D @50 ,000 IU every week and will follow up for routine testing of vitamin D, at least 2-3 times per year. He was informed of the risk of over-replacement of vitamin D and agrees to not increase his dose unless he discusses this with [name] first.  At risk for osteopenia and osteoporosis Jonathan [name] was given extended  (15 minutes) osteoporosis prevention counseling today. Jonathan [name] is at risk for osteopenia and osteoporosis due to his vitamin D deficiency. He was encouraged to take his vitamin D and follow his higher calcium diet and increase strengthening exercise to help strengthen his bones and decrease his risk of osteopenia and osteoporosis.  Constipation Jonathan [name] was informed decrease bowel movement frequency is normal while losing weight, but stools should not be hard or painful. He was advised to increase his H20 intake and increase raw vegetables. Kyrel will use Miralax OTC as needed.  Depression with Emotional Eating Behaviors We discussed behavior modification techniques today to help Jonathan [name] deal with his emotional  eating and depression. Jonathan [name] has seen Dr. Mallie Mussel, and he will follow up if needed.  Obesity Jonathan [name] is currently in the action stage of change. As such, his goal is to continue with weight loss efforts He has agreed to follow the Category 4 plan Jonathan [name] has been instructed to work up to a goal of 150 minutes of combined cardio and strengthening exercise per week for weight loss and  overall health benefits. We discussed the following Behavioral Modification Strategies today: planning for success, increase H2O intake, no skipping meals, keeping healthy foods in the home, increasing lean protein intake, decreasing simple carbohydrates, increasing vegetables, decrease eating out and work on meal planning and intentional eating Handouts for making smart fruit choices, was given to patient today.  Jonathan [name] has agreed to follow up with our clinic in 2 weeks. He was informed of the importance of frequent follow up visits to maximize his success with intensive lifestyle modifications for his multiple health conditions.  ALLERGIES: Allergies  Allergen Reactions   Codeine     REACTION: emesis   Cozaar [Losartan Potassium] Hives    MEDICATIONS: Current Outpatient Medications on File Prior to Visit  Medication Sig Dispense Refill   amLODipine (NORVASC) 5 MG tablet Take 1 tablet (5 mg total) by mouth daily. 90 tablet 0   Ascorbic Acid (VITAMIN C) 1000 MG tablet Take 1,000 mg by mouth daily.     atenolol-chlorthalidone (TENORETIC) 50-25 MG tablet TAKE 1 TABLET BY MOUTH EVERY DAY 90 tablet 2   Cholecalciferol (VITAMIN D3) 250 MCG (10000 UT) TABS Take by mouth.     Echinacea 500 MG CAPS Take by mouth.     ibuprofen (ADVIL) 200 MG tablet Take 200 mg by mouth every 6 (six) hours as needed.     potassium chloride (K-DUR) 10 MEQ tablet Take 1 tablet (10 mEq total) by mouth daily. 30 tablet 0   Vitamin D, Ergocalciferol, (DRISDOL) 1.25 MG (50000 UT) CAPS capsule Take 1 capsule (50,000 Units total) by mouth every 7 (seven) days. 4 capsule 0   zinc gluconate 50 MG tablet Take 50 mg by mouth daily.     No current facility-administered medications on file prior to visit.     PAST MEDICAL HISTORY: Past Medical History:  Diagnosis Date   Anxiety    Arthritis    Asthma    BPH (benign prostatic hyperplasia)    Chest pain    ED (erectile dysfunction)    Elevated PSA     9.33   Esophageal reflux    Hearing loss    History of hip replacement    History of knee replacement    Hypertension    Joint pain    Lactose intolerance    Numbness in both legs    Pre-diabetes    Prostate cancer [facility]) Jan 2016   prostatectomy   Sleep apnea    Vitamin D deficiency     PAST SURGICAL HISTORY: Past Surgical History:  Procedure Laterality Date   CORONARY CALCIUM SCORE / CARDIAC CT ANGIOGRAM  10/24/2017   Cor Ca Score = 0 - Low risk. Cor CTA: only mild plaque noted in mRCA with mLAD intramyocardial bridging noted.   FOOT SURGERY     bilateral foot   NM MYOVIEW LTD  07/2007   Normal nuclear stress test.  Mild fixed defect in the anterior wall likely related to soft tissue attenuation.  Normal EF.  Fair exercise capacity.   PROSTATE BIOPSY  06/13/2012   right lat mid  gleason 3+3=6   PROSTATECTOMY  2016   TOTAL HIP ARTHROPLASTY  2008 &2007   Right and left    SOCIAL HISTORY: Social History   Tobacco Use   Smoking status: Never Smoker   Smokeless tobacco: Never Used  Substance Use Topics   Alcohol use: No   Drug use: No    FAMILY HISTORY: Family History  Problem Relation Age of Onset   Hypertension Mother    Colon cancer Mother    Obesity Mother    Prostate cancer Father 10       alive now age 36   Asthma Father    Heart disease Father        Pt is not sure of details (no MI)   Alcoholism Father    Obesity Father    Lung cancer Maternal Grandmother        non-smoker   Tuberculosis Brother     ROS: Review of Systems  Constitutional: Positive for weight loss.  Gastrointestinal: Positive for constipation. Negative for nausea and vomiting.  Musculoskeletal:       Negative for muscle weakness  Psychiatric/Behavioral: Positive for depression. Negative for suicidal ideas.    PHYSICAL EXAM: Blood pressure 118/72, pulse 60, temperature 97.8 F (36.6 C), temperature source Oral, height 5\' 10"  (1.778 m), weight (!)  307 lb (139.3 kg), SpO2 97 %. Body mass index is 44.05 kg/m. Physical Exam Vitals signs reviewed.  Constitutional:      Appearance: Normal appearance. He is well-developed. He is obese.  Cardiovascular:     Rate and Rhythm: Normal rate.  Pulmonary:     Effort: Pulmonary effort is normal.  Musculoskeletal: Normal range of motion.  Skin:    General: Skin is warm and dry.  Neurological:     Mental Status: He is alert and oriented to person, place, and time.  Psychiatric:        Mood and Affect: Mood normal.        Behavior: Behavior normal.     RECENT LABS AND TESTS: BMET    Component Value Date/Time   NA 136 08/28/2018 1114   K 3.4 (L) 08/28/2018 1114   CL 90 (L) 08/28/2018 1114   CO2 27 08/28/2018 1114   GLUCOSE 101 (H) 08/28/2018 1114   GLUCOSE 110 (H) 04/06/2018 0848   BUN 15 08/28/2018 1114   CREATININE 0.86 08/28/2018 1114   CALCIUM 9.4 08/28/2018 1114   GFRNONAA 94 08/28/2018 1114   GFRAA 108 08/28/2018 1114   Lab Results  Component Value Date   HGBA1C 6.5 (H) 08/28/2018   HGBA1C 6.5 04/06/2018   HGBA1C 6.0 04/02/2016   HGBA1C 6.1 04/09/2014   Lab Results  Component Value Date   INSULIN 46.3 (H) 08/28/2018   CBC    Component Value Date/Time   WBC 9.7 04/06/2018 0848   RBC 5.02 04/06/2018 0848   HGB 13.4 04/06/2018 0848   HCT 41.3 04/06/2018 0848   PLT 302.0 04/06/2018 0848   MCV 82.2 04/06/2018 0848   MCH 25.5 (L) 04/20/2011 0953   MCHC 32.5 04/06/2018 0848   RDW 17.4 (H) 04/06/2018 0848   LYMPHSABS 2.0 04/06/2018 0848   MONOABS 1.0 04/06/2018 0848   EOSABS 0.2 04/06/2018 0848   BASOSABS 0.0 04/06/2018 0848   Iron/TIBC/Ferritin/ %Sat    Component Value Date/Time   IRON 150 12/15/2011 1507   FERRITIN 9.2 (L) 12/15/2011 1507   IRONPCTSAT 35.3 12/15/2011 1507   Lipid Panel     Component Value Date/Time  CHOL 175 04/06/2018 0848   TRIG 149.0 04/06/2018 0848   HDL 43.50 04/06/2018 0848   CHOLHDL 4 04/06/2018 0848   VLDL 29.8 04/06/2018  0848   LDLCALC 102 (H) 04/06/2018 0848   Hepatic Function Panel     Component Value Date/Time   PROT 7.7 08/28/2018 1114   ALBUMIN 4.0 08/28/2018 1114   AST 18 08/28/2018 1114   ALT 20 08/28/2018 1114   ALKPHOS 74 08/28/2018 1114   BILITOT 0.3 08/28/2018 1114   BILIDIR 0.1 04/02/2016 0720   IBILI 0.3 04/17/2010 2157      Component Value Date/Time   TSH 3.05 04/06/2018 0848   TSH 1.63 02/16/2018 1655   TSH 3.70 04/02/2016 0720     Ref. Range 08/28/2018 11:14  Vitamin D, 25-Hydroxy Latest Ref Range: 30.0 - 100.0 ng/mL 17.1 (L)    OBESITY BEHAVIORAL INTERVENTION VISIT  Today's visit was # 3   Starting weight: 319 lbs Starting date: 08/28/2018 Today's weight : 307 lbs Today's date: 09/27/2018 Total lbs lost to date: 12    09/27/2018  Height 5\' 10"  (1.778 m)  Weight 307 lb (139.3 kg) (A)  BMI (Calculated) 44.05  BLOOD PRESSURE - SYSTOLIC 656  BLOOD PRESSURE - DIASTOLIC 72   Body Fat % 81.2 %  Total Body Water (lbs) 131.6 lbs    ASK: We discussed the diagnosis of obesity with Rolene Course today and [name] agreed to give [name] permission to discuss obesity behavioral modification therapy today.  ASSESS: Jahmeer has the diagnosis of obesity and his BMI today is 44.05 Icarus is in the action stage of change   ADVISE: Jamis was educated on the multiple health risks of obesity as well as the benefit of weight loss to improve his health. He was advised of the need for long term treatment and the importance of lifestyle modifications to improve his current health and to decrease his risk of future health problems.  AGREE: Multiple dietary modification options and treatment options were discussed and  Chason agreed to follow the recommendations documented in the above note.  ARRANGE: Timoty was educated on the importance of frequent visits to treat obesity as outlined per CMS and USPSTF guidelines and agreed to schedule his next follow up appointment today.  Corey Skains, am acting as Location manager for [employer]. Owens Shark, DO

## 2018-10-02 ENCOUNTER — Other Ambulatory Visit (INDEPENDENT_AMBULATORY_CARE_PROVIDER_SITE_OTHER): Payer: Self-pay | Admitting: Bariatrics

## 2018-10-02 DIAGNOSIS — E559 Vitamin D deficiency, unspecified: Secondary | ICD-10-CM

## 2018-10-04 ENCOUNTER — Other Ambulatory Visit (INDEPENDENT_AMBULATORY_CARE_PROVIDER_SITE_OTHER): Payer: Self-pay | Admitting: Bariatrics

## 2018-10-04 DIAGNOSIS — E876 Hypokalemia: Secondary | ICD-10-CM

## 2018-10-11 ENCOUNTER — Encounter (INDEPENDENT_AMBULATORY_CARE_PROVIDER_SITE_OTHER): Payer: Self-pay | Admitting: Bariatrics

## 2018-10-11 ENCOUNTER — Ambulatory Visit (INDEPENDENT_AMBULATORY_CARE_PROVIDER_SITE_OTHER): Payer: Medicare Other | Admitting: Bariatrics

## 2018-10-11 ENCOUNTER — Other Ambulatory Visit: Payer: Self-pay

## 2018-10-11 VITALS — BP 107/61 | HR 60 | Temp 98.4°F | Ht 70.0 in | Wt 304.0 lb

## 2018-10-11 DIAGNOSIS — E559 Vitamin D deficiency, unspecified: Secondary | ICD-10-CM

## 2018-10-11 DIAGNOSIS — Z6841 Body Mass Index (BMI) 40.0 and over, adult: Secondary | ICD-10-CM

## 2018-10-11 DIAGNOSIS — E876 Hypokalemia: Secondary | ICD-10-CM

## 2018-10-11 DIAGNOSIS — F3289 Other specified depressive episodes: Secondary | ICD-10-CM

## 2018-10-11 DIAGNOSIS — Z9189 Other specified personal risk factors, not elsewhere classified: Secondary | ICD-10-CM | POA: Diagnosis not present

## 2018-10-11 MED ORDER — VITAMIN D (ERGOCALCIFEROL) 1.25 MG (50000 UNIT) PO CAPS: 50000.0000 [IU] | ORAL_CAPSULE | ORAL | 0 refills | Status: DC

## 2018-10-11 MED ORDER — POTASSIUM CHLORIDE ER 10 MEQ PO TBCR: 10.0000 meq | EXTENDED_RELEASE_TABLET | Freq: Every day | ORAL | 0 refills | Status: DC

## 2018-10-12 NOTE — Progress Notes (Signed)
Office: [phone]  /  Fax: [phone]   HPI:   Chief Complaint: OBESITY Jonathan [name] is here to discuss his progress with his obesity treatment plan. He is on the Category 4 plan and is following his eating plan approximately 100 % of the time. He states he is exercising with resistance bands and walking 20 to 30 minutes 2 to 3 times per week. Jonathan [name]'s weight is down 3 pounds. He is not struggling with any cravings. His weight is (!) 304 lb (137.9 kg) today and has had a weight loss of 3 pounds over a period of 2 weeks since his last visit. He has lost 15 lbs since starting treatment with [name].  Hypertension Jonathan [name] is a 63 y.o. male with hypertension. His blood pressure is 107/61. Jonathan [name] denies lightheadedness. He is working weight loss to help control his blood pressure with the goal of decreasing his risk of heart attack and stroke. Jonathan [name] blood pressure is well controlled.  Diabetes II Jonathan [name] has a diagnosis of diabetes type II. His last A1c was at 6.5 and last insulin level was at 46.3 He has been working on intensive lifestyle modifications including diet, exercise, and weight loss to help control his blood glucose levels. Jonathan [name] denies polyphagia.  Depression with emotional eating behaviors Jonathan [name] is struggling with emotional eating and using food for comfort to the extent that it is negatively impacting his health. He often snacks when he is not hungry. Jonathan [name] sometimes feels he is out of control and then feels guilty that he made poor food choices. Jonathan [name] is seeing Dr. Mallie [name] (bariatric psychologist). He has been working on behavior modification techniques to help reduce his emotional eating and has been somewhat successful. He shows no sign of suicidal or homicidal ideations.  [name] is taking vitamin D. Last vitamin D level was 17.1. Denies muscle weakness or nausea/vomiting.    ASSESSMENT AND PLAN:  Hypokalemia - Plan: potassium chloride  (K-DUR) 10 MEQ tablet  Vitamin D deficiency - Plan: Vitamin D, Ergocalciferol, (DRISDOL) 1.25 MG (50000 UT) CAPS capsule  Other depression  At risk for osteoporosis  Class 3 severe obesity with serious comorbidity and body mass index (BMI) of 40.0 to 44.9 in adult, unspecified obesity type (Jonathan [name])  PLAN:  Hypertension We discussed sodium restriction, working on healthy weight loss, and a regular exercise program as the means to achieve improved blood pressure control. Jonathan [name] agreed with this plan and agreed to follow up as directed. We will continue to monitor his blood pressure as well as his progress with the above lifestyle modifications. He will continue his medications as prescribed and will watch for signs of hypotension as he continues his lifestyle modifications.  Diabetes II Jonathan [name] has been given extensive diabetes education by myself today including ideal fasting and post-prandial blood glucose readings, individual ideal Hgb A1c goals and hypoglycemia prevention. We discussed the importance of good blood sugar control to decrease the likelihood of diabetic complications such as nephropathy, neuropathy, limb loss, blindness, coronary artery disease, and death. We discussed the importance of intensive lifestyle modification including diet, exercise and weight loss as the first line treatment for diabetes. Jonathan [name] agrees to follow up at the agreed upon time.  Depression with Emotional Eating Behaviors We discussed behavior modification techniques today to help Jonathan [name] deal with his emotional eating and depression. He will continue to see Dr. Mallie [name].  Vitamin D deficiency Continue Vitamin D. See Rx.   Obesity Jonathan [name] is currently in the  action stage of change. As such, his goal is to continue with weight loss efforts He has agreed to follow the Category 4 plan Jonathan [name] has been instructed to work up to a goal of 150 minutes of combined cardio and strengthening exercise per week for weight  loss and overall health benefits. We discussed the following Behavioral Modification Strategies today: increase H2O intake, no skipping meals, keeping healthy foods in the home, increasing lean protein intake, decreasing simple carbohydrates, increasing vegetables, decrease eating out, work on meal planning and intentional eating and decrease liquid calories. Jonathan [name] will read nutrition labels.  Jonathan [name] has agreed to follow up with our clinic in 2 weeks. He was informed of the importance of frequent follow up visits to maximize his success with intensive lifestyle modifications for his multiple health conditions.  ALLERGIES: Allergies  Allergen Reactions  . Codeine     REACTION: emesis  . Cozaar [Losartan Potassium] Hives    MEDICATIONS: Current Outpatient Medications on File Prior to Visit  Medication Sig Dispense Refill  . amLODipine (NORVASC) 5 MG tablet Take 1 tablet (5 mg total) by mouth daily. 90 tablet 0  . Ascorbic Acid (VITAMIN C) 1000 MG tablet Take 1,000 mg by mouth daily.    [name] atenolol-chlorthalidone (TENORETIC) 50-25 MG tablet TAKE 1 TABLET BY MOUTH EVERY DAY 90 tablet 2  . Cholecalciferol (VITAMIN D3) 250 MCG (10000 UT) TABS Take by mouth.    . Echinacea 500 MG CAPS Take by mouth.    [name] ibuprofen (ADVIL) 200 MG tablet Take 200 mg by mouth every 6 (six) hours as needed.    . zinc gluconate 50 MG tablet Take 50 mg by mouth daily.     No current facility-administered medications on file prior to visit.     PAST MEDICAL HISTORY: Past Medical History:  Diagnosis Date  . Anxiety   . Arthritis   . Asthma   . BPH (benign prostatic hyperplasia)   . Chest pain   . ED (erectile dysfunction)   . Elevated PSA    9.33  . Esophageal reflux   . Hearing loss   . History of hip replacement   . History of knee replacement   . Hypertension   . Joint pain   . Lactose intolerance   . Numbness in both legs   . Pre-diabetes   . Prostate cancer [facility]) Jan 2016   prostatectomy  .  Sleep apnea   . Vitamin D deficiency     PAST SURGICAL HISTORY: Past Surgical History:  Procedure Laterality Date  . CORONARY CALCIUM SCORE / CARDIAC CT ANGIOGRAM  10/24/2017   Cor Ca Score = 0 - Low risk. Cor CTA: only mild plaque noted in mRCA with mLAD intramyocardial bridging noted.  [name] FOOT SURGERY     bilateral foot  . NM MYOVIEW LTD  07/2007   Normal nuclear stress test.  Mild fixed defect in the anterior wall likely related to soft tissue attenuation.  Normal EF.  Fair exercise capacity.  [name] PROSTATE BIOPSY  06/13/2012   right lat mid gleason 3+3=6  . PROSTATECTOMY  2016  . TOTAL HIP ARTHROPLASTY  2008 &2007   Right and left    SOCIAL HISTORY: Social History   Tobacco Use  . Smoking status: Never Smoker  . Smokeless tobacco: Never Used  Substance Use Topics  . Alcohol use: No  . Drug use: No    FAMILY HISTORY: Family History  Problem Relation Age of Onset  . Hypertension Mother   .  Colon cancer Mother   . Obesity Mother   . Prostate cancer Father 79       alive now age 20  . Asthma Father   . Heart disease Father        Pt is not sure of details (no MI)  . Alcoholism Father   . Obesity Father   . Lung cancer Maternal Grandmother        non-smoker  . Tuberculosis Brother     ROS: Review of Systems  Constitutional: Positive for weight loss.  Neurological:       Negative for lightheadedness  Endo/Heme/Allergies:       Negative for polyphagia  Psychiatric/Behavioral: Positive for depression. Negative for suicidal ideas.    PHYSICAL EXAM: Blood pressure 107/61, pulse 60, temperature 98.4 F (36.9 C), temperature source Oral, height 5\' 10"  (1.778 m), weight (!) 304 lb (137.9 kg), SpO2 98 %. Body mass index is 43.62 kg/m. Physical Exam Vitals signs reviewed.  Constitutional:      Appearance: Normal appearance. He is well-developed. He is obese.  Cardiovascular:     Rate and Rhythm: Normal rate.  Pulmonary:     Effort: Pulmonary effort is normal.   Musculoskeletal: Normal range of motion.  Skin:    General: Skin is warm and dry.  Neurological:     Mental Status: He is alert and oriented to person, place, and time.  Psychiatric:        Mood and Affect: Mood normal.        Behavior: Behavior normal.        Thought Content: Thought content does not include homicidal or suicidal ideation.     RECENT LABS AND TESTS: BMET    Component Value Date/Time   NA 136 08/28/2018 1114   K 3.4 (L) 08/28/2018 1114   CL 90 (L) 08/28/2018 1114   CO2 27 08/28/2018 1114   GLUCOSE 101 (H) 08/28/2018 1114   GLUCOSE 110 (H) 04/06/2018 0848   BUN 15 08/28/2018 1114   CREATININE 0.86 08/28/2018 1114   CALCIUM 9.4 08/28/2018 1114   GFRNONAA 94 08/28/2018 1114   GFRAA 108 08/28/2018 1114   Lab Results  Component Value Date   HGBA1C 6.5 (H) 08/28/2018   HGBA1C 6.5 04/06/2018   HGBA1C 6.0 04/02/2016   HGBA1C 6.1 04/09/2014   Lab Results  Component Value Date   INSULIN 46.3 (H) 08/28/2018   CBC    Component Value Date/Time   WBC 9.7 04/06/2018 0848   RBC 5.02 04/06/2018 0848   HGB 13.4 04/06/2018 0848   HCT 41.3 04/06/2018 0848   PLT 302.0 04/06/2018 0848   MCV 82.2 04/06/2018 0848   MCH 25.5 (L) 04/20/2011 0953   MCHC 32.5 04/06/2018 0848   RDW 17.4 (H) 04/06/2018 0848   LYMPHSABS 2.0 04/06/2018 0848   MONOABS 1.0 04/06/2018 0848   EOSABS 0.2 04/06/2018 0848   BASOSABS 0.0 04/06/2018 0848   Iron/TIBC/Ferritin/ %Sat    Component Value Date/Time   IRON 150 12/15/2011 1507   FERRITIN 9.2 (L) 12/15/2011 1507   IRONPCTSAT 35.3 12/15/2011 1507   Lipid Panel     Component Value Date/Time   CHOL 175 04/06/2018 0848   TRIG 149.0 04/06/2018 0848   HDL 43.50 04/06/2018 0848   CHOLHDL 4 04/06/2018 0848   VLDL 29.8 04/06/2018 0848   LDLCALC 102 (H) 04/06/2018 0848   Hepatic Function Panel     Component Value Date/Time   PROT 7.7 08/28/2018 1114   ALBUMIN 4.0 08/28/2018 1114  AST 18 08/28/2018 1114   ALT 20 08/28/2018 1114    ALKPHOS 74 08/28/2018 1114   BILITOT 0.3 08/28/2018 1114   BILIDIR 0.1 04/02/2016 0720   IBILI 0.3 04/17/2010 2157      Component Value Date/Time   TSH 3.05 04/06/2018 0848   TSH 1.63 02/16/2018 1655   TSH 3.70 04/02/2016 0720      OBESITY BEHAVIORAL INTERVENTION VISIT  Today's visit was # 4   Starting weight: 319 lbs Starting date: 08/28/2018 Today's weight : 304 lbs Today's date: 10/11/2018 Total lbs lost to date: 15    10/11/2018  Height 5\' 10"  (1.778 m)  Weight 304 lb (137.9 kg) (A)  BMI (Calculated) 43.62  BLOOD PRESSURE - SYSTOLIC 798  BLOOD PRESSURE - DIASTOLIC 61   Body Fat % 92.1 %  Total Body Water (lbs) 130.8 lbs    ASK: We discussed the diagnosis of obesity with Jonathan [name] today and Jonathan [name] agreed to give [name] permission to discuss obesity behavioral modification therapy today.  ASSESS: Jonathan [name] has the diagnosis of obesity and his BMI today is 43.62 Jonathan [name] is in the action stage of change   ADVISE: Jonathan [name] was educated on the multiple health risks of obesity as well as the benefit of weight loss to improve his health. He was advised of the need for long term treatment and the importance of lifestyle modifications to improve his current health and to decrease his risk of future health problems.  AGREE: Multiple dietary modification options and treatment options were discussed and  [name] agreed to follow the recommendations documented in the above note.  ARRANGE: Jonathan [name] was educated on the importance of frequent visits to treat obesity as outlined per CMS and USPSTF guidelines and agreed to schedule his next follow up appointment today.  Jonathan [name], am acting as Location manager for [employer]. Owens Shark, DO

## 2018-10-26 ENCOUNTER — Ambulatory Visit (INDEPENDENT_AMBULATORY_CARE_PROVIDER_SITE_OTHER): Payer: Medicare Other | Admitting: Bariatrics

## 2018-10-30 ENCOUNTER — Encounter (INDEPENDENT_AMBULATORY_CARE_PROVIDER_SITE_OTHER): Payer: Self-pay | Admitting: Bariatrics

## 2018-10-30 NOTE — Telephone Encounter (Signed)
Please review

## 2018-11-01 ENCOUNTER — Other Ambulatory Visit (INDEPENDENT_AMBULATORY_CARE_PROVIDER_SITE_OTHER): Payer: Self-pay | Admitting: Bariatrics

## 2018-11-01 ENCOUNTER — Other Ambulatory Visit: Payer: Self-pay | Admitting: Adult Health

## 2018-11-01 DIAGNOSIS — I10 Essential (primary) hypertension: Secondary | ICD-10-CM

## 2018-11-01 DIAGNOSIS — E559 Vitamin D deficiency, unspecified: Secondary | ICD-10-CM

## 2018-11-01 NOTE — Telephone Encounter (Signed)
Sent to the pharmacy by e-scribe. 

## 2018-11-03 ENCOUNTER — Other Ambulatory Visit (INDEPENDENT_AMBULATORY_CARE_PROVIDER_SITE_OTHER): Payer: Self-pay | Admitting: Bariatrics

## 2018-11-03 DIAGNOSIS — E876 Hypokalemia: Secondary | ICD-10-CM

## 2018-11-04 ENCOUNTER — Encounter: Payer: Self-pay | Admitting: Family Medicine

## 2018-11-04 ENCOUNTER — Ambulatory Visit (INDEPENDENT_AMBULATORY_CARE_PROVIDER_SITE_OTHER): Payer: Medicare Other | Admitting: Family Medicine

## 2018-11-04 ENCOUNTER — Other Ambulatory Visit: Payer: Self-pay

## 2018-11-04 VITALS — Wt 295.0 lb

## 2018-11-04 DIAGNOSIS — L0100 Impetigo, unspecified: Secondary | ICD-10-CM

## 2018-11-04 DIAGNOSIS — L739 Follicular disorder, unspecified: Secondary | ICD-10-CM

## 2018-11-04 MED ORDER — CEPHALEXIN 500 MG PO CAPS: 500.0000 mg | ORAL_CAPSULE | Freq: Three times a day (TID) | ORAL | 0 refills | Status: AC

## 2018-11-04 MED ORDER — MUPIROCIN 2 % EX OINT: 1.0000 "application " | TOPICAL_OINTMENT | Freq: Three times a day (TID) | CUTANEOUS | 0 refills | Status: DC

## 2018-11-04 NOTE — Progress Notes (Signed)
Virtual Visit via Video Note  I connected with pt on 11/04/18 at  9:20 AM EDT by a video enabled telemedicine application and verified that I am speaking with the correct person using two identifiers.  Location patient: home Location provider:work or home office Persons participating in the virtual visit: patient, provider  I discussed the limitations of evaluation and management by telemedicine and the availability of in person appointments. The patient expressed understanding and agreed to proceed.  Telemedicine visit is a necessity given the COVID-19 restrictions in place at the current time.  HPI: 63 y/o male being seen today for rash on face. Onset approx 2 d/a, notes a rash around mouth/nose associated with starting to wear a new CPAP mask, Little red bumps were noted, these coalesced some and have drained some in the area where the mask has rubbed, esp where velcro was.  Used otc antibact ointment but not seeming to help.  Area hurts. Using gauze now to soak up drainage. Has felt well otherwise: no fever, malaise, or fatigue.   Past Medical History:  Diagnosis Date  . Anxiety   . Arthritis   . Asthma   . BPH (benign prostatic hyperplasia)   . Chest pain   . ED (erectile dysfunction)   . Elevated PSA    9.33  . Esophageal reflux   . Hearing loss   . History of hip replacement   . History of knee replacement   . Hypertension   . Joint pain   . Lactose intolerance   . Numbness in both legs   . Pre-diabetes   . Prostate cancer [facility]) Jan 2016   prostatectomy  . Sleep apnea   . Vitamin D deficiency     Past Surgical History:  Procedure Laterality Date  . CORONARY CALCIUM SCORE / CARDIAC CT ANGIOGRAM  10/24/2017   Cor Ca Score = 0 - Low risk. Cor CTA: only mild plaque noted in mRCA with mLAD intramyocardial bridging noted.  [name] FOOT SURGERY     bilateral foot  . NM MYOVIEW LTD  07/2007   Normal nuclear stress test.  Mild fixed defect in the anterior wall likely  related to soft tissue attenuation.  Normal EF.  Fair exercise capacity.  [name] PROSTATE BIOPSY  06/13/2012   right lat mid gleason 3+3=6  . PROSTATECTOMY  2016  . TOTAL HIP ARTHROPLASTY  2008 &2007   Right and left    Family History  Problem Relation Age of Onset  . Hypertension Mother   . Colon cancer Mother   . Obesity Mother   . Prostate cancer Father 72       alive now age 53  . Asthma Father   . Heart disease Father        Pt is not sure of details (no MI)  . Alcoholism Father   . Obesity Father   . Lung cancer Maternal Grandmother        non-smoker  . Tuberculosis Brother      Current Outpatient Medications:  .  amLODipine (NORVASC) 5 MG tablet, TAKE 1 TABLET BY MOUTH EVERY DAY, Disp: 90 tablet, Rfl: 1 .  Ascorbic Acid (VITAMIN C) 1000 MG tablet, Take 1,000 mg by mouth daily., Disp: , Rfl:  .  atenolol-chlorthalidone (TENORETIC) 50-25 MG tablet, TAKE 1 TABLET BY MOUTH EVERY DAY, Disp: 90 tablet, Rfl: 2 .  Cholecalciferol (VITAMIN D3) 250 MCG (10000 UT) TABS, Take by mouth., Disp: , Rfl:  .  Echinacea 500 MG CAPS, Take  by mouth., Disp: , Rfl:  .  ibuprofen (ADVIL) 200 MG tablet, Take 200 mg by mouth every 6 (six) hours as needed., Disp: , Rfl:  .  potassium chloride (K-DUR) 10 MEQ tablet, Take 1 tablet (10 mEq total) by mouth daily., Disp: 30 tablet, Rfl: 0 .  Vitamin D, Ergocalciferol, (DRISDOL) 1.25 MG (50000 UT) CAPS capsule, Take 1 capsule (50,000 Units total) by mouth every 7 (seven) days., Disp: 4 capsule, Rfl: 0 .  zinc gluconate 50 MG tablet, Take 50 mg by mouth daily., Disp: , Rfl:   EXAM:  VITALS per patient if applicable: Wt 295 lb (133.8 kg)   BMI 42.33 kg/m    GENERAL: alert, oriented, appears well and in no acute distress  HEENT: atraumatic, conjunttiva clear, no obvious abnormalities on inspection of external nose and ears Face: multiple erythematous papules on chin and beneath lower lip, a bit of oozing noted.    NECK: normal movements of the head and  neck  LUNGS: on inspection no signs of respiratory distress, breathing rate appears normal, no obvious gross SOB, gasping or wheezing  CV: no obvious cyanosis  MS: moves all visible extremities without noticeable abnormality  PSYCH/NEURO: pleasant and cooperative, no obvious depression or anxiety, speech and thought processing grossly intact  LABS: none today    Chemistry      Component Value Date/Time   NA 136 08/28/2018 1114   K 3.4 (L) 08/28/2018 1114   CL 90 (L) 08/28/2018 1114   CO2 27 08/28/2018 1114   BUN 15 08/28/2018 1114   CREATININE 0.86 08/28/2018 1114      Component Value Date/Time   CALCIUM 9.4 08/28/2018 1114   ALKPHOS 74 08/28/2018 1114   AST 18 08/28/2018 1114   ALT 20 08/28/2018 1114   BILITOT 0.3 08/28/2018 1114     Lab Results  Component Value Date   WBC 9.7 04/06/2018   HGB 13.4 04/06/2018   HCT 41.3 04/06/2018   MCV 82.2 04/06/2018   PLT 302.0 04/06/2018    ASSESSMENT AND PLAN:  Discussed the following assessment and plan:  1) Folliculitis/impetigo-type rash: rx'd bactroban ointment tid and keflex 500mg  tid x 5d. Warm compresses to area.  I discussed the assessment and treatment plan with the patient. The patient was provided an opportunity to ask questions and all were answered. The patient agreed with the plan and demonstrated an understanding of the instructions.   The patient was advised to call back or seek an in-person evaluation if the symptoms worsen or if the condition fails to improve as anticipated.  F/u:  If not improving.  Signed:  Crissie Sickles, MD           11/04/2018

## 2018-11-16 ENCOUNTER — Ambulatory Visit (INDEPENDENT_AMBULATORY_CARE_PROVIDER_SITE_OTHER): Payer: Medicare Other | Admitting: Bariatrics

## 2018-11-16 ENCOUNTER — Other Ambulatory Visit: Payer: Self-pay

## 2018-11-16 ENCOUNTER — Encounter (INDEPENDENT_AMBULATORY_CARE_PROVIDER_SITE_OTHER): Payer: Self-pay | Admitting: Bariatrics

## 2018-11-16 VITALS — BP 115/61 | HR 60 | Temp 97.4°F | Ht 70.0 in | Wt 294.0 lb

## 2018-11-16 DIAGNOSIS — E119 Type 2 diabetes mellitus without complications: Secondary | ICD-10-CM

## 2018-11-16 DIAGNOSIS — E876 Hypokalemia: Secondary | ICD-10-CM

## 2018-11-16 DIAGNOSIS — E559 Vitamin D deficiency, unspecified: Secondary | ICD-10-CM | POA: Diagnosis not present

## 2018-11-16 DIAGNOSIS — Z6841 Body Mass Index (BMI) 40.0 and over, adult: Secondary | ICD-10-CM | POA: Diagnosis not present

## 2018-11-16 DIAGNOSIS — E66813 Obesity, class 3: Secondary | ICD-10-CM

## 2018-11-16 MED ORDER — POTASSIUM CHLORIDE ER 10 MEQ PO TBCR: 10.0000 meq | EXTENDED_RELEASE_TABLET | Freq: Every day | ORAL | 0 refills | Status: DC

## 2018-11-16 MED ORDER — VITAMIN D (ERGOCALCIFEROL) 1.25 MG (50000 UNIT) PO CAPS: 50000.0000 [IU] | ORAL_CAPSULE | ORAL | 0 refills | Status: DC

## 2018-11-20 ENCOUNTER — Encounter (INDEPENDENT_AMBULATORY_CARE_PROVIDER_SITE_OTHER): Payer: Self-pay | Admitting: Bariatrics

## 2018-11-20 NOTE — Progress Notes (Signed)
Office: [phone]  /  Fax: [phone]   HPI:   Chief Complaint: OBESITY Jonathan [name] is here to discuss his progress with his obesity treatment plan. He is on the Category 4 plan and is following his eating plan approximately 75% of the time. He states he is exercising 0 minutes 0 times per week. Jonathan [name] is down 10 lbs and doing well overall. He contracted COVID-19 and recovered (contracted 10/20/2018). He had a positive test and was quarantined for 14 days. His weight is 294 lb (133.4 kg) today and has had a weight loss of 10 pounds over a period of 5 weeks since his last visit. He has lost 25 lbs since starting treatment with [name].  [name] has a diagnosis of Vitamin D deficiency. His last Vitamin D level was 17.1 on 08/28/2018. He is currently taking prescription Vit D and denies nausea, vomiting or muscle weakness.  Diabetes II Jonathan [name] has a diagnosis of diabetes type II and is on no medications. Jonathan [name] does not report checking his blood sugars. Last A1c was 6.5 on 08/28/2018 and insulin was 46.3. He has been working on intensive lifestyle modifications including diet, exercise, and weight loss to help control his blood glucose levels.  [name]'s last potassium was 3.4 on 08/28/2018. He reports no paresthesias.  ASSESSMENT AND PLAN:  Vitamin D deficiency - Plan: Vitamin D, Ergocalciferol, (DRISDOL) 1.25 MG (50000 UT) CAPS capsule  Type 2 diabetes mellitus without complication, without long-term current use of insulin (HCC)  Hypokalemia - Plan: potassium chloride (K-DUR) 10 MEQ tablet  Class 3 severe obesity with serious comorbidity and body mass index (BMI) of 40.0 to 44.9 in adult, unspecified obesity type ([place])  PLAN:  [name] was informed that low Vitamin D levels contributes to fatigue and are associated with obesity, breast, and colon cancer. He agrees to continue to take prescription Vit D @ 50,000 IU every week #4 with 0  refills and will follow-up for routine testing of Vitamin D, at least 2-3 times per year. He was informed of the risk of over-replacement of Vitamin D and agrees to not increase his dose unless he discusses this with [name] first. Jonathan [name] agrees to follow-up with our clinic in 2 weeks.  Diabetes II Jonathan [name] has been given extensive diabetes education by myself today including ideal fasting and post-prandial blood glucose readings, individual ideal HgA1c goals  and hypoglycemia prevention. We discussed the importance of good blood sugar control to decrease the likelihood of diabetic complications such as nephropathy, neuropathy, limb loss, blindness, coronary artery disease, and death. We discussed the importance of intensive lifestyle modification including diet, exercise and weight loss as the first line treatment for diabetes. Jonathan [name] was instructed to decrease carbohydrates, increase protein, and will follow-up at the agreed upon time to monitor his progress.  [name] was given a prescription for K-Dur 1 tablet daily #30 with 0 refills. He agrees to follow-up with our clinic in 2 weeks.  Obesity Jonathan [name] is currently in the action stage of change. As such, his goal is to continue with weight loss efforts. He has agreed to follow the Category 4 plan. Jonathan [name] will work on meal planning and intentional eating. Jonathan [name] has been instructed to resume walking for weight loss and overall health benefits. We discussed the following Behavioral Modification Strategies today: increasing lean protein intake, decreasing simple carbohydrates, increasing vegetables, increase H20 intake, decrease eating out, no skipping meals, work on meal planning and easy cooking plans, keeping healthy  foods in the home, and planning for success.  Jonathan [name] has agreed to follow-up with our clinic in 2 weeks. He was informed of the importance of frequent follow-up visits to maximize his success with intensive lifestyle modifications  for his multiple health conditions.  ALLERGIES: Allergies  Allergen Reactions  . Codeine     REACTION: emesis  . Cozaar [Losartan Potassium] Hives    MEDICATIONS: Current Outpatient Medications on File Prior to Visit  Medication Sig Dispense Refill  . amLODipine (NORVASC) 5 MG tablet TAKE 1 TABLET BY MOUTH EVERY DAY 90 tablet 1  . Ascorbic Acid (VITAMIN C) 1000 MG tablet Take 1,000 mg by mouth daily.    [name] atenolol-chlorthalidone (TENORETIC) 50-25 MG tablet TAKE 1 TABLET BY MOUTH EVERY DAY 90 tablet 2  . Cholecalciferol (VITAMIN D3) 250 MCG (10000 UT) TABS Take by mouth.    . Echinacea 500 MG CAPS Take by mouth.    [name] ibuprofen (ADVIL) 200 MG tablet Take 200 mg by mouth every 6 (six) hours as needed.    . mupirocin ointment (BACTROBAN) 2 % Apply 1 application topically 3 (three) times daily. 15 g 0  . zinc gluconate 50 MG tablet Take 50 mg by mouth daily.     No current facility-administered medications on file prior to visit.     PAST MEDICAL HISTORY: Past Medical History:  Diagnosis Date  . Anxiety   . Arthritis   . Asthma   . BPH (benign prostatic hyperplasia)   . Chest pain   . ED (erectile dysfunction)   . Elevated PSA    9.33  . Esophageal reflux   . Hearing loss   . History of hip replacement   . History of knee replacement   . Hypertension   . Joint pain   . Lactose intolerance   . Numbness in both legs   . Pre-diabetes   . Prostate cancer Jonathan [name]) Jan 2016   prostatectomy  . Sleep apnea   . Vitamin D deficiency     PAST SURGICAL HISTORY: Past Surgical History:  Procedure Laterality Date  . CORONARY CALCIUM SCORE / CARDIAC CT ANGIOGRAM  10/24/2017   Cor Ca Score = 0 - Low risk. Cor CTA: only mild plaque noted in mRCA with mLAD intramyocardial bridging noted.  [name] FOOT SURGERY     bilateral foot  . NM MYOVIEW LTD  07/2007   Normal nuclear stress test.  Mild fixed defect in the anterior wall likely related to soft tissue attenuation.  Normal EF.  Fair  exercise capacity.  [name] PROSTATE BIOPSY  06/13/2012   right lat mid gleason 3+3=6  . PROSTATECTOMY  2016  . TOTAL HIP ARTHROPLASTY  2008 &2007   Right and left    SOCIAL HISTORY: Social History   Tobacco Use  . Smoking status: Never Smoker  . Smokeless tobacco: Never Used  Substance Use Topics  . Alcohol use: No  . Drug use: No    FAMILY HISTORY: Family History  Problem Relation Age of Onset  . Hypertension Mother   . Colon cancer Mother   . Obesity Mother   . Prostate cancer Father 71       alive now age 46  . Asthma Father   . Heart disease Father        Pt is not sure of details (no MI)  . Alcoholism Father   . Obesity Father   . Lung cancer Maternal Grandmother        non-smoker  .  Tuberculosis Brother    ROS: Review of Systems  Gastrointestinal: Negative for nausea and vomiting.  Musculoskeletal:       Negative for muscle weakness.  Neurological:       Negative for paresthesias.   PHYSICAL EXAM: Blood pressure 115/61, pulse 60, temperature (!) 97.4 F (36.3 C), temperature source Oral, height 5\' 10"  (1.778 m), weight 294 lb (133.4 kg), SpO2 97 %. Body mass index is 42.18 kg/m. Physical Exam Vitals signs reviewed.  Constitutional:      Appearance: Normal appearance. He is obese.  Cardiovascular:     Rate and Rhythm: Normal rate.     Pulses: Normal pulses.  Pulmonary:     Effort: Pulmonary effort is normal.     Breath sounds: Normal breath sounds.  Musculoskeletal: Normal range of motion.  Skin:    General: Skin is warm and dry.  Neurological:     Mental Status: He is alert and oriented to person, place, and time.  Psychiatric:        Behavior: Behavior normal.   RECENT LABS AND TESTS: BMET    Component Value Date/Time   NA 136 08/28/2018 1114   K 3.4 (L) 08/28/2018 1114   CL 90 (L) 08/28/2018 1114   CO2 27 08/28/2018 1114   GLUCOSE 101 (H) 08/28/2018 1114   GLUCOSE 110 (H) 04/06/2018 0848   BUN 15 08/28/2018 1114   CREATININE 0.86  08/28/2018 1114   CALCIUM 9.4 08/28/2018 1114   GFRNONAA 94 08/28/2018 1114   GFRAA 108 08/28/2018 1114   Lab Results  Component Value Date   HGBA1C 6.5 (H) 08/28/2018   HGBA1C 6.5 04/06/2018   HGBA1C 6.0 04/02/2016   HGBA1C 6.1 04/09/2014   Lab Results  Component Value Date   INSULIN 46.3 (H) 08/28/2018   CBC    Component Value Date/Time   WBC 9.7 04/06/2018 0848   RBC 5.02 04/06/2018 0848   HGB 13.4 04/06/2018 0848   HCT 41.3 04/06/2018 0848   PLT 302.0 04/06/2018 0848   MCV 82.2 04/06/2018 0848   MCH 25.5 (L) 04/20/2011 0953   MCHC 32.5 04/06/2018 0848   RDW 17.4 (H) 04/06/2018 0848   LYMPHSABS 2.0 04/06/2018 0848   MONOABS 1.0 04/06/2018 0848   EOSABS 0.2 04/06/2018 0848   BASOSABS 0.0 04/06/2018 0848   Iron/TIBC/Ferritin/ %Sat    Component Value Date/Time   IRON 150 12/15/2011 1507   FERRITIN 9.2 (L) 12/15/2011 1507   IRONPCTSAT 35.3 12/15/2011 1507   Lipid Panel     Component Value Date/Time   CHOL 175 04/06/2018 0848   TRIG 149.0 04/06/2018 0848   HDL 43.50 04/06/2018 0848   CHOLHDL 4 04/06/2018 0848   VLDL 29.8 04/06/2018 0848   LDLCALC 102 (H) 04/06/2018 0848   Hepatic Function Panel     Component Value Date/Time   PROT 7.7 08/28/2018 1114   ALBUMIN 4.0 08/28/2018 1114   AST 18 08/28/2018 1114   ALT 20 08/28/2018 1114   ALKPHOS 74 08/28/2018 1114   BILITOT 0.3 08/28/2018 1114   BILIDIR 0.1 04/02/2016 0720   IBILI 0.3 04/17/2010 2157      Component Value Date/Time   TSH 3.05 04/06/2018 0848   TSH 1.63 02/16/2018 1655   TSH 3.70 04/02/2016 0720   Results for WYATTE, DAMES (MRN [id number]) as of 11/20/2018 10:00  Ref. Range 08/28/2018 11:14  Vitamin D, 25-Hydroxy Latest Ref Range: 30.0 - 100.0 ng/mL 17.1 (L)   OBESITY BEHAVIORAL INTERVENTION VISIT  Today's visit was #5  Starting weight: 319 lbs Starting date: 08/28/2018 Today's weight: 294 lbs Today's date: 11/16/2018 Total lbs lost to date: 25 At least 15 minutes were spent on  discussing the following behavioral intervention visit.    11/16/2018  Height 5\' 10"  (1.778 m)  Weight 294 lb (133.4 kg)  BMI (Calculated) 42.18  BLOOD PRESSURE - SYSTOLIC 972  BLOOD PRESSURE - DIASTOLIC 61   Body Fat % 82.0 %  Total Body Water (lbs) 123 lbs   ASK: We discussed the diagnosis of obesity with Rolene Course today and [name] agreed to give [name] permission to discuss obesity behavioral modification therapy today.  ASSESS: Lucus has the diagnosis of obesity and his BMI today is 42.2. Eleuterio is in the action stage of change.   ADVISE: Ashir was educated on the multiple health risks of obesity as well as the benefit of weight loss to improve his health. He was advised of the need for long term treatment and the importance of lifestyle modifications to improve his current health and to decrease his risk of future health problems.  AGREE: Multiple dietary modification options and treatment options were discussed and  Hunt agreed to follow the recommendations documented in the above note.  ARRANGE: Yug was educated on the importance of frequent visits to treat obesity as outlined per CMS and USPSTF guidelines and agreed to schedule his next follow up appointment today.  [name], am acting as Location manager for [employer], DO  I have reviewed the above documentation for accuracy and completeness, and I agree with the above. -[name], DO

## 2018-11-30 ENCOUNTER — Encounter (INDEPENDENT_AMBULATORY_CARE_PROVIDER_SITE_OTHER): Payer: Self-pay | Admitting: Bariatrics

## 2018-11-30 ENCOUNTER — Other Ambulatory Visit: Payer: Self-pay

## 2018-11-30 ENCOUNTER — Ambulatory Visit (INDEPENDENT_AMBULATORY_CARE_PROVIDER_SITE_OTHER): Payer: Medicare Other | Admitting: Bariatrics

## 2018-11-30 VITALS — BP 128/72 | HR 60 | Temp 98.4°F | Ht 70.0 in | Wt 293.0 lb

## 2018-11-30 DIAGNOSIS — Z6841 Body Mass Index (BMI) 40.0 and over, adult: Secondary | ICD-10-CM

## 2018-11-30 DIAGNOSIS — E559 Vitamin D deficiency, unspecified: Secondary | ICD-10-CM | POA: Diagnosis not present

## 2018-11-30 DIAGNOSIS — E119 Type 2 diabetes mellitus without complications: Secondary | ICD-10-CM

## 2018-11-30 MED ORDER — VITAMIN D (ERGOCALCIFEROL) 1.25 MG (50000 UNIT) PO CAPS: 50000.0000 [IU] | ORAL_CAPSULE | ORAL | 0 refills | Status: DC

## 2018-12-04 ENCOUNTER — Encounter (INDEPENDENT_AMBULATORY_CARE_PROVIDER_SITE_OTHER): Payer: Self-pay | Admitting: Bariatrics

## 2018-12-04 NOTE — Telephone Encounter (Signed)
Please review

## 2018-12-05 NOTE — Progress Notes (Signed)
Office: [phone]  /  Fax: [phone]   HPI:   Chief Complaint: OBESITY Jonathan [name] is here to discuss his progress with his obesity treatment plan. He is on the Category 4 plan and is following his eating plan approximately 100 % of the time. He states he is walking 30 minutes 3 times per week and he is doing resistance bands 5 minutes 3 times per week. Jonathan [name] is down 1 pound, but he is doing well overall. He is not getting adequate sleep. His weight is 293 lb (132.9 kg) today and has had a weight loss of 1 pound over a period of 2 weeks since his last visit. He has lost 26 lbs since starting treatment with [name].  [name] has a diagnosis of vitamin D deficiency. He is currently taking vit D and denies nausea, vomiting or muscle weakness.  Diabetes II Jonathan [name] has a diagnosis of diabetes type II. Jonathan [name] denies polyphagia. Last A1c was at 6.5 He has been working on intensive lifestyle modifications including diet, exercise, and weight loss to help control his blood glucose levels.   ASSESSMENT AND PLAN:  Vitamin D deficiency - Plan: Vitamin D, Ergocalciferol, (DRISDOL) 1.25 MG (50000 UT) CAPS capsule  Type 2 diabetes mellitus without complication, without long-term current use of insulin (HCC)  Class 3 severe obesity with serious comorbidity and body mass index (BMI) of 40.0 to 44.9 in adult, unspecified obesity type ([place])  PLAN:  [name] was informed that low vitamin D levels contributes to fatigue and are associated with obesity, breast, and colon cancer. Jonathan [name] agrees to continue to take prescription Vit D @50 ,000 IU every week #4 with no refills and he will follow up for routine testing of vitamin D, at least 2-3 times per year. He was informed of the risk of over-replacement of vitamin D and agrees to not increase his dose unless he discusses this with [name] first. Jonathan [name] agrees to follow up with our clinic in 2 weeks.  Diabetes II Jonathan [name] has  been given extensive diabetes education by myself today including ideal fasting and post-prandial blood glucose readings, individual ideal Hgb A1c goals and hypoglycemia prevention. We discussed the importance of good blood sugar control to decrease the likelihood of diabetic complications such as nephropathy, neuropathy, limb loss, blindness, coronary artery disease, and death. We discussed the importance of intensive lifestyle modification including diet, exercise and weight loss as the first line treatment for diabetes. Jonathan [name] agrees to continue his diabetes medications and will follow up at the agreed upon time.  Obesity Jonathan [name] is currently in the action stage of change. As such, his goal is to continue with weight loss efforts He has agreed to keep a food journal with 550 to 700 calories and 45+ grams of protein daily and follow the Category 4 plan Jonathan [name] will continue activity (cardio and resistance bands) for weight loss and overall health benefits. We discussed the following Behavioral Modification Strategies today: planning for success, increase H2O intake, no skipping meals, keeping healthy foods in the home, increasing lean protein intake, decreasing simple carbohydrates, increasing vegetables, decrease eating out and work on meal planning and easy cooking plans Jonathan [name] will check on CPAP and he will get a good mask. He will begin a MV for men.   Jonathan [name] has agreed to follow up with our clinic in 2 weeks. He was informed of the importance of frequent follow up visits to maximize his success with intensive lifestyle modifications for his multiple health  conditions.  ALLERGIES: Allergies  Allergen Reactions  . Codeine     REACTION: emesis  . Cozaar [Losartan Potassium] Hives    MEDICATIONS: Current Outpatient Medications on File Prior to Visit  Medication Sig Dispense Refill  . amLODipine (NORVASC) 5 MG tablet TAKE 1 TABLET BY MOUTH EVERY DAY 90 tablet 1  . Ascorbic Acid (VITAMIN C)  1000 MG tablet Take 1,000 mg by mouth daily.    [name] atenolol-chlorthalidone (TENORETIC) 50-25 MG tablet TAKE 1 TABLET BY MOUTH EVERY DAY 90 tablet 2  . Cholecalciferol (VITAMIN D3) 250 MCG (10000 UT) TABS Take by mouth.    . Echinacea 500 MG CAPS Take by mouth.    [name] ibuprofen (ADVIL) 200 MG tablet Take 200 mg by mouth every 6 (six) hours as needed.    . mupirocin ointment (BACTROBAN) 2 % Apply 1 application topically 3 (three) times daily. 15 g 0  . potassium chloride (K-DUR) 10 MEQ tablet Take 1 tablet (10 mEq total) by mouth daily. 30 tablet 0  . zinc gluconate 50 MG tablet Take 50 mg by mouth daily.     No current facility-administered medications on file prior to visit.     PAST MEDICAL HISTORY: Past Medical History:  Diagnosis Date  . Anxiety   . Arthritis   . Asthma   . BPH (benign prostatic hyperplasia)   . Chest pain   . ED (erectile dysfunction)   . Elevated PSA    9.33  . Esophageal reflux   . Hearing loss   . History of hip replacement   . History of knee replacement   . Hypertension   . Joint pain   . Lactose intolerance   . Numbness in both legs   . Pre-diabetes   . Prostate cancer [facility]) Jan 2016   prostatectomy  . Sleep apnea   . Vitamin D deficiency     PAST SURGICAL HISTORY: Past Surgical History:  Procedure Laterality Date  . CORONARY CALCIUM SCORE / CARDIAC CT ANGIOGRAM  10/24/2017   Cor Ca Score = 0 - Low risk. Cor CTA: only mild plaque noted in mRCA with mLAD intramyocardial bridging noted.  [name] FOOT SURGERY     bilateral foot  . NM MYOVIEW LTD  07/2007   Normal nuclear stress test.  Mild fixed defect in the anterior wall likely related to soft tissue attenuation.  Normal EF.  Fair exercise capacity.  [name] PROSTATE BIOPSY  06/13/2012   right lat mid gleason 3+3=6  . PROSTATECTOMY  2016  . TOTAL HIP ARTHROPLASTY  2008 &2007   Right and left    SOCIAL HISTORY: Social History   Tobacco Use  . Smoking status: Never Smoker  . Smokeless tobacco: Never  Used  Substance Use Topics  . Alcohol use: No  . Drug use: No    FAMILY HISTORY: Family History  Problem Relation Age of Onset  . Hypertension Mother   . Colon cancer Mother   . Obesity Mother   . Prostate cancer Father 65       alive now age 5  . Asthma Father   . Heart disease Father        Pt is not sure of details (no MI)  . Alcoholism Father   . Obesity Father   . Lung cancer Maternal Grandmother        non-smoker  . Tuberculosis Brother     ROS: Review of Systems  Constitutional: Positive for weight loss.  Gastrointestinal: Negative for nausea and  vomiting.  Musculoskeletal:       Negative for muscle weakness  Endo/Heme/Allergies:       Negative for polyphagia    PHYSICAL EXAM: Blood pressure 128/72, pulse 60, temperature 98.4 F (36.9 C), temperature source Oral, height 5\' 10"  (1.778 m), weight 293 lb (132.9 kg), SpO2 94 %. Body mass index is 42.04 kg/m. Physical Exam Vitals signs reviewed.  Constitutional:      Appearance: Normal appearance. He is well-developed. He is obese.  Cardiovascular:     Rate and Rhythm: Normal rate.  Pulmonary:     Effort: Pulmonary effort is normal.  Musculoskeletal: Normal range of motion.  Skin:    General: Skin is warm and dry.  Neurological:     Mental Status: He is alert and oriented to person, place, and time.  Psychiatric:        Mood and Affect: Mood normal.        Behavior: Behavior normal.     RECENT LABS AND TESTS: BMET    Component Value Date/Time   NA 136 08/28/2018 1114   K 3.4 (L) 08/28/2018 1114   CL 90 (L) 08/28/2018 1114   CO2 27 08/28/2018 1114   GLUCOSE 101 (H) 08/28/2018 1114   GLUCOSE 110 (H) 04/06/2018 0848   BUN 15 08/28/2018 1114   CREATININE 0.86 08/28/2018 1114   CALCIUM 9.4 08/28/2018 1114   GFRNONAA 94 08/28/2018 1114   GFRAA 108 08/28/2018 1114   Lab Results  Component Value Date   HGBA1C 6.5 (H) 08/28/2018   HGBA1C 6.5 04/06/2018   HGBA1C 6.0 04/02/2016   HGBA1C 6.1  04/09/2014   Lab Results  Component Value Date   INSULIN 46.3 (H) 08/28/2018   CBC    Component Value Date/Time   WBC 9.7 04/06/2018 0848   RBC 5.02 04/06/2018 0848   HGB 13.4 04/06/2018 0848   HCT 41.3 04/06/2018 0848   PLT 302.0 04/06/2018 0848   MCV 82.2 04/06/2018 0848   MCH 25.5 (L) 04/20/2011 0953   MCHC 32.5 04/06/2018 0848   RDW 17.4 (H) 04/06/2018 0848   LYMPHSABS 2.0 04/06/2018 0848   MONOABS 1.0 04/06/2018 0848   EOSABS 0.2 04/06/2018 0848   BASOSABS 0.0 04/06/2018 0848   Iron/TIBC/Ferritin/ %Sat    Component Value Date/Time   IRON 150 12/15/2011 1507   FERRITIN 9.2 (L) 12/15/2011 1507   IRONPCTSAT 35.3 12/15/2011 1507   Lipid Panel     Component Value Date/Time   CHOL 175 04/06/2018 0848   TRIG 149.0 04/06/2018 0848   HDL 43.50 04/06/2018 0848   CHOLHDL 4 04/06/2018 0848   VLDL 29.8 04/06/2018 0848   LDLCALC 102 (H) 04/06/2018 0848   Hepatic Function Panel     Component Value Date/Time   PROT 7.7 08/28/2018 1114   ALBUMIN 4.0 08/28/2018 1114   AST 18 08/28/2018 1114   ALT 20 08/28/2018 1114   ALKPHOS 74 08/28/2018 1114   BILITOT 0.3 08/28/2018 1114   BILIDIR 0.1 04/02/2016 0720   IBILI 0.3 04/17/2010 2157      Component Value Date/Time   TSH 3.05 04/06/2018 0848   TSH 1.63 02/16/2018 1655   TSH 3.70 04/02/2016 0720      OBESITY BEHAVIORAL INTERVENTION VISIT  Today's visit was # 6   Starting weight: 319 lbs Starting date: 08/28/2018 Today's weight : 293 lbs Today's date: 11/30/2018 Total lbs lost to date: 26    11/30/2018  Height 5\' 10"  (1.778 m)  Weight 293 lb (132.9 kg)  BMI (  Calculated) 42.04  BLOOD PRESSURE - SYSTOLIC 0000000  BLOOD PRESSURE - DIASTOLIC 72   Body Fat % 123XX123 %  Total Body Water (lbs) 125.5 lbs    ASK: We discussed the diagnosis of obesity with Jonathan [name] today and Jonathan [name] agreed to give [name] permission to discuss obesity behavioral modification therapy today.  ASSESS: Jonathan [name] has the diagnosis of obesity  and his BMI today is 42.04 Zichen is in the action stage of change   ADVISE: Jonathan [name] was educated on the multiple health risks of obesity as well as the benefit of weight loss to improve his health. He was advised of the need for long term treatment and the importance of lifestyle modifications to improve his current health and to decrease his risk of future health problems.  AGREE: Multiple dietary modification options and treatment options were discussed and  Jonathan [name] agreed to follow the recommendations documented in the above note.  ARRANGE: Jonathan [name] was educated on the importance of frequent visits to treat obesity as outlined per CMS and USPSTF guidelines and agreed to schedule his next follow up appointment today.  Corey Skains, am acting as Location manager for [employer]. Owens Shark, DO  I have reviewed the above documentation for accuracy and completeness, and I agree with the above. -[name], DO

## 2018-12-06 ENCOUNTER — Encounter (INDEPENDENT_AMBULATORY_CARE_PROVIDER_SITE_OTHER): Payer: Self-pay | Admitting: Bariatrics

## 2018-12-07 ENCOUNTER — Other Ambulatory Visit (INDEPENDENT_AMBULATORY_CARE_PROVIDER_SITE_OTHER): Payer: Self-pay | Admitting: Bariatrics

## 2018-12-07 DIAGNOSIS — E559 Vitamin D deficiency, unspecified: Secondary | ICD-10-CM

## 2018-12-09 ENCOUNTER — Other Ambulatory Visit (INDEPENDENT_AMBULATORY_CARE_PROVIDER_SITE_OTHER): Payer: Self-pay | Admitting: Bariatrics

## 2018-12-09 DIAGNOSIS — E876 Hypokalemia: Secondary | ICD-10-CM

## 2018-12-11 ENCOUNTER — Ambulatory Visit (INDEPENDENT_AMBULATORY_CARE_PROVIDER_SITE_OTHER): Payer: Medicare Other | Admitting: Pulmonary Disease

## 2018-12-11 ENCOUNTER — Other Ambulatory Visit: Payer: Self-pay

## 2018-12-11 ENCOUNTER — Encounter: Payer: Self-pay | Admitting: Pulmonary Disease

## 2018-12-11 VITALS — BP 124/70 | HR 60 | Temp 98.3°F | Ht 71.0 in | Wt 296.8 lb

## 2018-12-11 DIAGNOSIS — G4733 Obstructive sleep apnea (adult) (pediatric): Secondary | ICD-10-CM

## 2018-12-11 DIAGNOSIS — Z9989 Dependence on other enabling machines and devices: Secondary | ICD-10-CM | POA: Diagnosis not present

## 2018-12-11 NOTE — Progress Notes (Signed)
Jonathan [name]    LK:[id number]    1955-08-18  Primary Care Physician:Nafziger, Tommi Rumps, NP  Referring Physician: Dorothyann Peng, NP [address] [place],  [place] [id number]  Chief complaint:   Shortness of breath-better Obstructive sleep apnea-good compliance with CPAP   HPI: Has been doing well since his last visit Good compliance with CPAP Over 25 pound weight loss since last visit  Sleep quality is better  He recently had a cardiopulmonary exercise study-consistent with deconditioning Has been working on weight loss and optimizing his diet, down over 25 pounds  Progressive shortness of breath over the last couple years  Loud breathing even when taking a nap during the day  Has gained a lot of weight since retiring from the [employer]-he is changing many things to help him lose weight Denies a headache, he does have a dry throat of the mornings History of prostate cancer Orthopnea  Occupation: Worked for the postal service Exposures: No exposure to mold Smoking history: Non-smoker  Outpatient Encounter Medications as of 12/11/2018  Medication Sig  . amLODipine (NORVASC) 5 MG tablet TAKE 1 TABLET BY MOUTH EVERY DAY  . Ascorbic Acid (VITAMIN C) 1000 MG tablet Take 1,000 mg by mouth daily.  [name] atenolol-chlorthalidone (TENORETIC) 50-25 MG tablet TAKE 1 TABLET BY MOUTH EVERY DAY  . Cholecalciferol (VITAMIN D3) 250 MCG (10000 UT) TABS Take by mouth.  . Echinacea 500 MG CAPS Take by mouth.  [name] ibuprofen (ADVIL) 200 MG tablet Take 200 mg by mouth every 6 (six) hours as needed.  . mupirocin ointment (BACTROBAN) 2 % Apply 1 application topically 3 (three) times daily.  . potassium chloride (K-DUR) 10 MEQ tablet Take 1 tablet (10 mEq total) by mouth daily.  . Vitamin D, Ergocalciferol, (DRISDOL) 1.25 MG (50000 UT) CAPS capsule Take 1 capsule (50,000 Units total) by mouth every 7 (seven) days.  [name] zinc gluconate 50 MG tablet Take 50 mg by mouth daily.   No  facility-administered encounter medications on file as of 12/11/2018.     Allergies as of 12/11/2018 - Review Complete 12/11/2018  Allergen Reaction Noted  . Codeine    . Cozaar [losartan potassium] Hives 09/08/2011    Past Medical History:  Diagnosis Date  . Anxiety   . Arthritis   . Asthma   . BPH (benign prostatic hyperplasia)   . Chest pain   . ED (erectile dysfunction)   . Elevated PSA    9.33  . Esophageal reflux   . Hearing loss   . History of hip replacement   . History of knee replacement   . Hypertension   . Joint pain   . Lactose intolerance   . Numbness in both legs   . Pre-diabetes   . Prostate cancer [facility]) Jan 2016   prostatectomy  . Sleep apnea   . Vitamin D deficiency     Past Surgical History:  Procedure Laterality Date  . CORONARY CALCIUM SCORE / CARDIAC CT ANGIOGRAM  10/24/2017   Cor Ca Score = 0 - Low risk. Cor CTA: only mild plaque noted in mRCA with mLAD intramyocardial bridging noted.  [name] FOOT SURGERY     bilateral foot  . NM MYOVIEW LTD  07/2007   Normal nuclear stress test.  Mild fixed defect in the anterior wall likely related to soft tissue attenuation.  Normal EF.  Fair exercise capacity.  [name] PROSTATE BIOPSY  06/13/2012   right lat mid gleason 3+3=6  .  PROSTATECTOMY  2016  . TOTAL HIP ARTHROPLASTY  2008 &2007   Right and left    Family History  Problem Relation Age of Onset  . Hypertension Mother   . Colon cancer Mother   . Obesity Mother   . Prostate cancer Father 66       alive now age 34  . Asthma Father   . Heart disease Father        Pt is not sure of details (no MI)  . Alcoholism Father   . Obesity Father   . Lung cancer Maternal Grandmother        non-smoker  . Tuberculosis Brother     Social History   Socioeconomic History  . Marital status: Married    Spouse name: Not on file  . Number of children: 2  . Years of education: Not on file  . Highest education level: Not on file  Occupational History  . Occupation:  Education administrator: [employer]  Social Needs  . Financial resource strain: Not on file  . Food insecurity    Worry: Not on file    Inability: Not on file  . Transportation needs    Medical: Not on file    Non-medical: Not on file  Tobacco Use  . Smoking status: Never Smoker  . Smokeless tobacco: Never Used  Substance and Sexual Activity  . Alcohol use: No  . Drug use: No  . Sexual activity: Not on file  Lifestyle  . Physical activity    Days per week: Not on file    Minutes per session: Not on file  . Stress: Not on file  Relationships  . Social Herbalist on phone: Not on file    Gets together: Not on file    Attends religious service: Not on file    Active member of club or organization: Not on file    Attends meetings of clubs or organizations: Not on file    Relationship status: Not on file  . Intimate partner violence    Fear of current or ex partner: Not on file    Emotionally abused: Not on file    Physically abused: Not on file    Forced sexual activity: Not on file  Other Topics Concern  . Not on file  Social History Narrative  . Not on file    Review of systems: No fever, no chills Breathing is better   Physical Exam:  Vitals:   12/11/18 1512  BP: 124/70  Pulse: 60  Temp: 98.3 F (36.8 C)  SpO2: 98%   Gen:      No distress HEENT: Neck supple with no JVD, no thyromegaly Lungs:    Clear breath sounds bilaterally CV:         S1-S2 appreciated Abd:   Bowel sounds appreciated Ext:    No edema; adequate peripheral perfusion   Results of the Epworth flowsheet 03/21/2018 11/22/2017  Sitting and reading 0 3  Watching TV 0 3  Sitting, inactive in a public place (e.g. a theatre or a meeting) 0 3  As a passenger in a car for an hour without a break 0 3  Lying down to rest in the afternoon when circumstances permit 3 3  Sitting and talking to someone 0 2  Sitting quietly after a lunch without alcohol 0 2  In a car, while stopped for a few minutes  in traffic 0 0  Total score 3 19  Data Reviewed: [name]  Recent CT, coronary CT noted .  Compliance data reveals excellent compliance with AHI of 5.3  PFT shows no obstruction, no significant bronchodilator response, moderate restriction with mild reduction in diffusing capacity  Assessment:  .  Obstructive sleep apnea, adequately treated with CPAP therapy  .  Morbid obesity -Has lost over 25 pounds -Continues to stay physically active  Pulmonary hypertension  Excessive daytime sleepiness continues to improve   Plan/Recommendations: .  Continue CPAP use  .  Continue aggressive weight loss measures  .  Regular exercises  .  I will see him back in the office in about 6 months   Sherrilyn Rist MD [facility] 12/11/2018, 3:19 PM  CC: Dorothyann Peng, NP

## 2018-12-11 NOTE — Patient Instructions (Signed)
CPAP with good compliance and good efficiency on your download  Continue with CPAP  Continue weight loss efforts  I will see you back in the office in about 6 months  Call with any significant concerns

## 2018-12-14 ENCOUNTER — Ambulatory Visit (INDEPENDENT_AMBULATORY_CARE_PROVIDER_SITE_OTHER): Payer: Medicare Other | Admitting: Bariatrics

## 2018-12-14 ENCOUNTER — Other Ambulatory Visit: Payer: Self-pay

## 2018-12-14 ENCOUNTER — Encounter (INDEPENDENT_AMBULATORY_CARE_PROVIDER_SITE_OTHER): Payer: Self-pay | Admitting: Bariatrics

## 2018-12-14 VITALS — BP 116/74 | HR 60 | Temp 98.3°F | Ht 71.0 in | Wt 290.0 lb

## 2018-12-14 DIAGNOSIS — E559 Vitamin D deficiency, unspecified: Secondary | ICD-10-CM | POA: Diagnosis not present

## 2018-12-14 DIAGNOSIS — E119 Type 2 diabetes mellitus without complications: Secondary | ICD-10-CM

## 2018-12-14 DIAGNOSIS — Z6841 Body Mass Index (BMI) 40.0 and over, adult: Secondary | ICD-10-CM | POA: Diagnosis not present

## 2018-12-14 NOTE — Progress Notes (Signed)
Office: [phone]  /  Fax: [phone]   HPI:   Chief Complaint: OBESITY Jonathan [name] is here to discuss his progress with his obesity treatment plan. He is on the Category 4 plan and journaling 500-700 calories and 45 grams of protein at supper. He states he is walking 15-45 minutes 3 times per week and exercising on the elliptical and using resistance bands 5 minutes 3 times per week. Jonathan [name] is down 3 lbs and has done well overall. He is down from a size 4X to a size 2X. His weight is 290 lb (131.5 kg) today and has had a weight loss of 3 pounds over a period of 2 weeks since his last visit. He has lost 29 lbs since starting treatment with [name].  [name] has a diagnosis of Vitamin D deficiency. Last Vitamin D was 17.1 on 08/28/2018. He is currently taking Vit D and denies nausea, vomiting or muscle weakness.  Diabetes II Jonathan [name] has a diagnosis of diabetes type II and is on no medications. Jonathan [name] does not report checking his blood sugars. Last A1c was 6.5 on 08/28/2018 with an insulin of 46.3. He has been working on intensive lifestyle modifications including diet, exercise, and weight loss to help control his blood glucose levels.  ASSESSMENT AND PLAN:  Vitamin D deficiency  Type 2 diabetes mellitus without complication, without long-term current use of insulin (HCC)  Class 3 severe obesity with serious comorbidity and body mass index (BMI) of 40.0 to 44.9 in adult, unspecified obesity type ([place])  PLAN:  Vitamin D Deficiency [name] was informed that low Vitamin D levels contributes to fatigue and are associated with obesity, breast, and colon cancer. He agrees to continue taking Vit D and will follow-up for routine testing of Vitamin D, at least 2-3 times per year. He was informed of the risk of over-replacement of Vitamin D and agrees to not increase his dose unless he discusses this with [name] first. [name] agrees to follow-up with our clinic in 2 weeks.  Diabetes II  Jonathan [name] has been given extensive diabetes education by myself today including ideal fasting and post-prandial blood glucose readings, individual ideal HgA1c goals  and hypoglycemia prevention. We discussed the importance of good blood sugar control to decrease the likelihood of diabetic complications such as nephropathy, neuropathy, limb loss, blindness, coronary artery disease, and death. We discussed the importance of intensive lifestyle modification including diet, exercise and weight loss as the first line treatment for diabetes. [name] was instructed to decrease carbohydrates and increase protein.  Obesity Jonathan [name] is currently in the action stage of change. As such, his goal is to continue with weight loss efforts. He has agreed to follow the Category 4 plan and journal 500-700 calories + 45 grams of protein at supper. Jonathan [name] will work on meal planning, intentional eating, and increasing water intake to at least 64 ounces a day. Jonathan [name] has been instructed to do more walking and doing elliptical at home for weight loss and overall health benefits. We discussed the following Behavioral Modification Strategies today: increasing lean protein intake, decreasing simple carbohydrates, increasing vegetables, increase H20 intake, decrease eating out, no skipping meals, work on meal planning and easy cooking plans, keeping healthy foods in the home, and planning for success.  Jonathan [name] has agreed to follow-up with our clinic in 2 weeks fasting. He was informed of the importance of frequent follow-up visits to maximize his success with intensive lifestyle modifications for his multiple health conditions.  ALLERGIES: Allergies  Allergen  Reactions  . Codeine     REACTION: emesis  . Cozaar [Losartan Potassium] Hives    MEDICATIONS: Current Outpatient Medications on File Prior to Visit  Medication Sig Dispense Refill  . amLODipine (NORVASC) 5 MG tablet TAKE 1 TABLET BY MOUTH EVERY DAY 90 tablet 1  .  Ascorbic Acid (VITAMIN C) 1000 MG tablet Take 1,000 mg by mouth daily.    [name] atenolol-chlorthalidone (TENORETIC) 50-25 MG tablet TAKE 1 TABLET BY MOUTH EVERY DAY 90 tablet 2  . Cholecalciferol (VITAMIN D3) 250 MCG (10000 UT) TABS Take by mouth.    . Echinacea 500 MG CAPS Take by mouth.    [name] ibuprofen (ADVIL) 200 MG tablet Take 200 mg by mouth every 6 (six) hours as needed.    . mupirocin ointment (BACTROBAN) 2 % Apply 1 application topically 3 (three) times daily. 15 g 0  . potassium chloride (K-DUR) 10 MEQ tablet Take 1 tablet (10 mEq total) by mouth daily. 30 tablet 0  . Vitamin D, Ergocalciferol, (DRISDOL) 1.25 MG (50000 UT) CAPS capsule Take 1 capsule (50,000 Units total) by mouth every 7 (seven) days. 4 capsule 0  . zinc gluconate 50 MG tablet Take 50 mg by mouth daily.     No current facility-administered medications on file prior to visit.     PAST MEDICAL HISTORY: Past Medical History:  Diagnosis Date  . Anxiety   . Arthritis   . Asthma   . BPH (benign prostatic hyperplasia)   . Chest pain   . ED (erectile dysfunction)   . Elevated PSA    9.33  . Esophageal reflux   . Hearing loss   . History of hip replacement   . History of knee replacement   . Hypertension   . Joint pain   . Lactose intolerance   . Numbness in both legs   . Pre-diabetes   . Prostate cancer [facility]) Jan 2016   prostatectomy  . Sleep apnea   . Vitamin D deficiency     PAST SURGICAL HISTORY: Past Surgical History:  Procedure Laterality Date  . CORONARY CALCIUM SCORE / CARDIAC CT ANGIOGRAM  10/24/2017   Cor Ca Score = 0 - Low risk. Cor CTA: only mild plaque noted in mRCA with mLAD intramyocardial bridging noted.  [name] FOOT SURGERY     bilateral foot  . NM MYOVIEW LTD  07/2007   Normal nuclear stress test.  Mild fixed defect in the anterior wall likely related to soft tissue attenuation.  Normal EF.  Fair exercise capacity.  [name] PROSTATE BIOPSY  06/13/2012   right lat mid gleason 3+3=6  . PROSTATECTOMY   2016  . TOTAL HIP ARTHROPLASTY  2008 &2007   Right and left    SOCIAL HISTORY: Social History   Tobacco Use  . Smoking status: Never Smoker  . Smokeless tobacco: Never Used  Substance Use Topics  . Alcohol use: No  . Drug use: No    FAMILY HISTORY: Family History  Problem Relation Age of Onset  . Hypertension Mother   . Colon cancer Mother   . Obesity Mother   . Prostate cancer Father 71       alive now age 62  . Asthma Father   . Heart disease Father        Pt is not sure of details (no MI)  . Alcoholism Father   . Obesity Father   . Lung cancer Maternal Grandmother        non-smoker  . Tuberculosis  Brother    ROS: Review of Systems  Gastrointestinal: Negative for nausea and vomiting.  Musculoskeletal:       Negative for muscle weakness.   PHYSICAL EXAM: Blood pressure 116/74, pulse 60, temperature 98.3 F (36.8 C), temperature source Oral, height 5\' 11"  (1.803 m), weight 290 lb (131.5 kg), SpO2 97 %. Body mass index is 40.45 kg/m. Physical Exam Vitals signs reviewed.  Constitutional:      Appearance: Normal appearance. He is obese.  Cardiovascular:     Rate and Rhythm: Normal rate.     Pulses: Normal pulses.  Pulmonary:     Effort: Pulmonary effort is normal.     Breath sounds: Normal breath sounds.  Musculoskeletal: Normal range of motion.  Skin:    General: Skin is warm and dry.  Neurological:     Mental Status: He is alert and oriented to person, place, and time.  Psychiatric:        Behavior: Behavior normal.   RECENT LABS AND TESTS: BMET    Component Value Date/Time   NA 136 08/28/2018 1114   K 3.4 (L) 08/28/2018 1114   CL 90 (L) 08/28/2018 1114   CO2 27 08/28/2018 1114   GLUCOSE 101 (H) 08/28/2018 1114   GLUCOSE 110 (H) 04/06/2018 0848   BUN 15 08/28/2018 1114   CREATININE 0.86 08/28/2018 1114   CALCIUM 9.4 08/28/2018 1114   GFRNONAA 94 08/28/2018 1114   GFRAA 108 08/28/2018 1114   Lab Results  Component Value Date   HGBA1C 6.5  (H) 08/28/2018   HGBA1C 6.5 04/06/2018   HGBA1C 6.0 04/02/2016   HGBA1C 6.1 04/09/2014   Lab Results  Component Value Date   INSULIN 46.3 (H) 08/28/2018   CBC    Component Value Date/Time   WBC 9.7 04/06/2018 0848   RBC 5.02 04/06/2018 0848   HGB 13.4 04/06/2018 0848   HCT 41.3 04/06/2018 0848   PLT 302.0 04/06/2018 0848   MCV 82.2 04/06/2018 0848   MCH 25.5 (L) 04/20/2011 0953   MCHC 32.5 04/06/2018 0848   RDW 17.4 (H) 04/06/2018 0848   LYMPHSABS 2.0 04/06/2018 0848   MONOABS 1.0 04/06/2018 0848   EOSABS 0.2 04/06/2018 0848   BASOSABS 0.0 04/06/2018 0848   Iron/TIBC/Ferritin/ %Sat    Component Value Date/Time   IRON 150 12/15/2011 1507   FERRITIN 9.2 (L) 12/15/2011 1507   IRONPCTSAT 35.3 12/15/2011 1507   Lipid Panel     Component Value Date/Time   CHOL 175 04/06/2018 0848   TRIG 149.0 04/06/2018 0848   HDL 43.50 04/06/2018 0848   CHOLHDL 4 04/06/2018 0848   VLDL 29.8 04/06/2018 0848   LDLCALC 102 (H) 04/06/2018 0848   Hepatic Function Panel     Component Value Date/Time   PROT 7.7 08/28/2018 1114   ALBUMIN 4.0 08/28/2018 1114   AST 18 08/28/2018 1114   ALT 20 08/28/2018 1114   ALKPHOS 74 08/28/2018 1114   BILITOT 0.3 08/28/2018 1114   BILIDIR 0.1 04/02/2016 0720   IBILI 0.3 04/17/2010 2157      Component Value Date/Time   TSH 3.05 04/06/2018 0848   TSH 1.63 02/16/2018 1655   TSH 3.70 04/02/2016 0720   Results for DOMENIQUE, CUERVO (MRN [id number]) as of 12/14/2018 14:48  Ref. Range 08/28/2018 11:14  Vitamin D, 25-Hydroxy Latest Ref Range: 30.0 - 100.0 ng/mL 17.1 (L)   OBESITY BEHAVIORAL INTERVENTION VISIT  Today's visit was #7  Starting weight: 319 lbs Starting date: 08/28/2018 Today's weight: 290 lbs  Today's date: 12/14/2018 Total lbs lost to date: 29 At least 15 minutes were spent on discussing the following behavioral intervention visit.    12/14/2018  Height 5\' 11"  (1.803 m)  Weight 290 lb (131.5 kg)  BMI (Calculated) 40.46  BLOOD  PRESSURE - SYSTOLIC [id number]  BLOOD PRESSURE - DIASTOLIC 74   Body Fat % 41 %  Total Body Water (lbs) 126 lbs   ASK: We discussed the diagnosis of obesity with Rolene Course today and [name] agreed to give [name] permission to discuss obesity behavioral modification therapy today.  ASSESS: Selvin has the diagnosis of obesity and his BMI today is 41.6. Cuinn is in the action stage of change.   ADVISE: Jerome was educated on the multiple health risks of obesity as well as the benefit of weight loss to improve his health. He was advised of the need for long term treatment and the importance of lifestyle modifications to improve his current health and to decrease his risk of future health problems.  AGREE: Multiple dietary modification options and treatment options were discussed and  Wynton agreed to follow the recommendations documented in the above note.  ARRANGE: Naveen was educated on the importance of frequent visits to treat obesity as outlined per CMS and USPSTF guidelines and agreed to schedule his next follow up appointment today.  [name], am acting as Location manager for [employer], DO  I have reviewed the above documentation for accuracy and completeness, and I agree with the above. -[name], DO

## 2018-12-31 ENCOUNTER — Other Ambulatory Visit (INDEPENDENT_AMBULATORY_CARE_PROVIDER_SITE_OTHER): Payer: Self-pay | Admitting: Bariatrics

## 2018-12-31 DIAGNOSIS — E559 Vitamin D deficiency, unspecified: Secondary | ICD-10-CM

## 2019-01-04 ENCOUNTER — Encounter (INDEPENDENT_AMBULATORY_CARE_PROVIDER_SITE_OTHER): Payer: Self-pay | Admitting: Bariatrics

## 2019-01-04 ENCOUNTER — Other Ambulatory Visit: Payer: Self-pay

## 2019-01-04 ENCOUNTER — Ambulatory Visit (INDEPENDENT_AMBULATORY_CARE_PROVIDER_SITE_OTHER): Payer: Medicare Other | Admitting: Bariatrics

## 2019-01-04 VITALS — BP 126/77 | Temp 98.2°F | Ht 71.0 in | Wt 285.0 lb

## 2019-01-04 DIAGNOSIS — E559 Vitamin D deficiency, unspecified: Secondary | ICD-10-CM

## 2019-01-04 DIAGNOSIS — Z6839 Body mass index (BMI) 39.0-39.9, adult: Secondary | ICD-10-CM

## 2019-01-04 DIAGNOSIS — I10 Essential (primary) hypertension: Secondary | ICD-10-CM | POA: Diagnosis not present

## 2019-01-04 DIAGNOSIS — E119 Type 2 diabetes mellitus without complications: Secondary | ICD-10-CM

## 2019-01-04 MED ORDER — VITAMIN D (ERGOCALCIFEROL) 1.25 MG (50000 UNIT) PO CAPS: 50000.0000 [IU] | ORAL_CAPSULE | ORAL | 0 refills | Status: DC

## 2019-01-04 MED ORDER — POTASSIUM CHLORIDE ER 10 MEQ PO TBCR: 10.0000 meq | EXTENDED_RELEASE_TABLET | Freq: Every day | ORAL | 0 refills | Status: DC

## 2019-01-05 LAB — COMPREHENSIVE METABOLIC PANEL
ALT: 20 IU/L (ref 0–44)
AST: 19 IU/L (ref 0–40)
Albumin/Globulin Ratio: 1 — ABNORMAL LOW (ref 1.2–2.2)
Albumin: 4 g/dL (ref 3.8–4.8)
Alkaline Phosphatase: 68 IU/L (ref 39–117)
BUN/Creatinine Ratio: 12 (ref 10–24)
BUN: 12 mg/dL (ref 8–27)
Bilirubin Total: 0.7 mg/dL (ref 0.0–1.2)
CO2: 31 mmol/L — ABNORMAL HIGH (ref 20–29)
Calcium: 9.8 mg/dL (ref 8.6–10.2)
Chloride: 93 mmol/L — ABNORMAL LOW (ref 96–106)
Creatinine, Ser: 0.99 mg/dL (ref 0.76–1.27)
GFR calc Af Amer: 94 mL/min/{1.73_m2} (ref >59)
GFR calc non Af Amer: 81 mL/min/{1.73_m2} (ref >59)
Globulin, Total: 4 g/dL (ref 1.5–4.5)
Glucose: 91 mg/dL (ref 65–99)
Potassium: 3.9 mmol/L (ref 3.5–5.2)
Sodium: 137 mmol/L (ref 134–144)
Total Protein: 8 g/dL (ref 6.0–8.5)

## 2019-01-05 LAB — HEMOGLOBIN A1C
Est. average glucose Bld gHb Est-mCnc: 128 mg/dL
Hgb A1c MFr Bld: 6.1 % — ABNORMAL HIGH (ref 4.8–5.6)

## 2019-01-05 LAB — VITAMIN D 25 HYDROXY (VIT D DEFICIENCY, FRACTURES): Vit D, 25-Hydroxy: 47.3 ng/mL (ref 30.0–100.0)

## 2019-01-08 ENCOUNTER — Encounter (INDEPENDENT_AMBULATORY_CARE_PROVIDER_SITE_OTHER): Payer: Self-pay | Admitting: Bariatrics

## 2019-01-08 DIAGNOSIS — E559 Vitamin D deficiency, unspecified: Secondary | ICD-10-CM | POA: Insufficient documentation

## 2019-01-08 NOTE — Progress Notes (Signed)
Office: [phone]  /  Fax: [phone]   HPI:   Chief Complaint: OBESITY Jonathan [name] is here to discuss his progress with his obesity treatment plan. He is on the Category 3 plan and journaling 500-700 calories + 45 grams of protein at supper and is following his eating plan approximately 33% of the time. He states he is exercising 0 minutes 0 times per week. Jonathan [name] is down 5 lbs and doing well overall. His weight is 285 lb (129.3 kg) today and has had a weight loss of 5 pounds over a period of 3 weeks since his last visit. He has lost 34 lbs since starting treatment with [name].  [name] has a diagnosis of Vitamin D deficiency. Last Vitamin D 17.1 on 08/28/2018. He is currently taking prescription Vit D and denies nausea, vomiting or muscle weakness.  Diabetes II Jonathan [name] has a diagnosis of diabetes type II. Jonathan [name] does not report checking his blood sugars. Last A1c was 6.5 on 08/28/2018 with an insulin of 46.3. He has been working on intensive lifestyle modifications including diet, exercise, and weight loss to help control his blood glucose levels. No polyphagia.  Hypertension Jonathan [name] is a 63 y.o. male with hypertension, which is well controlled.  Jonathan [name] denies chest pain or shortness of breath on exertion. He is working weight loss to help control his blood pressure with the goal of decreasing his risk of heart attack and stroke. Potassium is low secondary to diuretic. No lightheadedness.  ASSESSMENT AND PLAN:  Vitamin D deficiency - Plan: VITAMIN D 25 Hydroxy (Vit-D Deficiency, Fractures), Vitamin D, Ergocalciferol, (DRISDOL) 1.25 MG (50000 UT) CAPS capsule  Type 2 diabetes mellitus without complication, without long-term current use of insulin (Jonathan [name]) - Plan: Comprehensive metabolic panel, Hemoglobin A1c  Essential hypertension - Plan: potassium chloride (K-DUR) 10 MEQ tablet  Class 2 severe obesity with serious comorbidity and body mass index (BMI)  of 39.0 to 39.9 in adult, unspecified obesity type (Jonathan [name])  PLAN:  [name] was informed that low Vitamin D levels contributes to fatigue and are associated with obesity, breast, and colon cancer. He agrees to continue to take prescription Vit D @ 50,000 IU every week #4 with 0 refills and will follow-up for routine testing of Vitamin D, at least 2-3 times per year. He was informed of the risk of over-replacement of Vitamin D and agrees to not increase his dose unless he discusses this with [name] first. Jonathan [name] agrees to follow-up with our clinic in 2 weeks.  Diabetes II Jonathan [name] has been given extensive diabetes education by myself today including ideal fasting and post-prandial blood glucose readings, individual ideal HgA1c goals  and hypoglycemia prevention. We discussed the importance of good blood sugar control to decrease the likelihood of diabetic complications such as nephropathy, neuropathy, limb loss, blindness, coronary artery disease, and death. We discussed the importance of intensive lifestyle modification including diet, exercise and weight loss as the first line treatment for diabetes. Jonathan [name] will have insulin and A1c checked today and will follow-up at the agreed upon time.  Hypertension We discussed sodium restriction, working on healthy weight loss, and a regular exercise program as the means to achieve improved blood pressure control. Jonathan [name] agreed with this plan and agreed to follow up as directed. We will continue to monitor his blood pressure as well as his progress with the above lifestyle modifications. He will continue his medications as prescribed and was given a prescription for potassium chloride (  K-Dur) 10 mEq QD #30 with 0 refills and agrees to follow-up with our office in 2 weeks. He will watch for signs of hypotension as he continues his lifestyle modifications.  Obesity Jonathan [name] is currently in the action stage of change. As such, his goal is to continue with  weight loss efforts. He has agreed to follow the Category 4 plan and journal 500-700 calories + 45 grams of protein at supper. Credence has been instructed to work up to a goal of 150 minutes of combined cardio and strengthening exercise per week for weight loss and overall health benefits. We discussed the following Behavioral Modification Strategies today: increasing lean protein intake, decreasing simple carbohydrates, increasing vegetables, increase H20 intake, decrease eating out, no skipping meals, work on meal planning and easy cooking plans, keeping healthy foods in the home, and planning for success.  Jonathan [name] has agreed to follow up with our clinic in 2 weeks. He was informed of the importance of frequent follow up visits to maximize his success with intensive lifestyle modifications for his multiple health conditions.  ALLERGIES: Allergies  Allergen Reactions  . Codeine     REACTION: emesis  . Cozaar [Losartan Potassium] Hives    MEDICATIONS: Current Outpatient Medications on File Prior to Visit  Medication Sig Dispense Refill  . amLODipine (NORVASC) 5 MG tablet TAKE 1 TABLET BY MOUTH EVERY DAY 90 tablet 1  . Ascorbic Acid (VITAMIN C) 1000 MG tablet Take 1,000 mg by mouth daily.    [name] atenolol-chlorthalidone (TENORETIC) 50-25 MG tablet TAKE 1 TABLET BY MOUTH EVERY DAY 90 tablet 2  . Cholecalciferol (VITAMIN D3) 250 MCG (10000 UT) TABS Take by mouth.    . Echinacea 500 MG CAPS Take by mouth.    [name] ibuprofen (ADVIL) 200 MG tablet Take 200 mg by mouth every 6 (six) hours as needed.    . mupirocin ointment (BACTROBAN) 2 % Apply 1 application topically 3 (three) times daily. 15 g 0  . zinc gluconate 50 MG tablet Take 50 mg by mouth daily.     No current facility-administered medications on file prior to visit.     PAST MEDICAL HISTORY: Past Medical History:  Diagnosis Date  . Anxiety   . Arthritis   . Asthma   . BPH (benign prostatic hyperplasia)   . Chest pain   . ED (erectile  dysfunction)   . Elevated PSA    9.33  . Esophageal reflux   . Hearing loss   . History of hip replacement   . History of knee replacement   . Hypertension   . Joint pain   . Lactose intolerance   . Numbness in both legs   . Pre-diabetes   . Prostate cancer [facility]) Jan 2016   prostatectomy  . Sleep apnea   . Vitamin D deficiency     PAST SURGICAL HISTORY: Past Surgical History:  Procedure Laterality Date  . CORONARY CALCIUM SCORE / CARDIAC CT ANGIOGRAM  10/24/2017   Cor Ca Score = 0 - Low risk. Cor CTA: only mild plaque noted in mRCA with mLAD intramyocardial bridging noted.  [name] FOOT SURGERY     bilateral foot  . NM MYOVIEW LTD  07/2007   Normal nuclear stress test.  Mild fixed defect in the anterior wall likely related to soft tissue attenuation.  Normal EF.  Fair exercise capacity.  [name] PROSTATE BIOPSY  06/13/2012   right lat mid gleason 3+3=6  . PROSTATECTOMY  2016  . TOTAL HIP ARTHROPLASTY  2008 &  2007   Right and left    SOCIAL HISTORY: Social History   Tobacco Use  . Smoking status: Never Smoker  . Smokeless tobacco: Never Used  Substance Use Topics  . Alcohol use: No  . Drug use: No    FAMILY HISTORY: Family History  Problem Relation Age of Onset  . Hypertension Mother   . Colon cancer Mother   . Obesity Mother   . Prostate cancer Father 1       alive now age 20  . Asthma Father   . Heart disease Father        Pt is not sure of details (no MI)  . Alcoholism Father   . Obesity Father   . Lung cancer Maternal Grandmother        non-smoker  . Tuberculosis Brother    ROS: Review of Systems  Respiratory: Negative for shortness of breath.   Cardiovascular: Negative for chest pain.  Gastrointestinal: Negative for nausea.  Musculoskeletal:       Negative for muscle weakness.  Neurological:       Negative for lightheadedness.  Endo/Heme/Allergies:       Negative for polyphagia.   PHYSICAL EXAM: Blood pressure 126/77, temperature 98.2 F (36.8 C),  temperature source Oral, height 5\' 11"  (1.803 m), weight 285 lb (129.3 kg), SpO2 97 %. Body mass index is 39.75 kg/m. Physical Exam Vitals signs reviewed.  Constitutional:      Appearance: Normal appearance. He is obese.  Cardiovascular:     Rate and Rhythm: Normal rate.     Pulses: Normal pulses.  Pulmonary:     Effort: Pulmonary effort is normal.     Breath sounds: Normal breath sounds.  Musculoskeletal: Normal range of motion.  Skin:    General: Skin is warm and dry.  Neurological:     Mental Status: He is alert and oriented to person, place, and time.  Psychiatric:        Behavior: Behavior normal.   RECENT LABS AND TESTS: BMET    Component Value Date/Time   NA 137 01/04/2019 1504   K 3.9 01/04/2019 1504   CL 93 (L) 01/04/2019 1504   CO2 31 (H) 01/04/2019 1504   GLUCOSE 91 01/04/2019 1504   GLUCOSE 110 (H) 04/06/2018 0848   BUN 12 01/04/2019 1504   CREATININE 0.99 01/04/2019 1504   CALCIUM 9.8 01/04/2019 1504   GFRNONAA 81 01/04/2019 1504   GFRAA 94 01/04/2019 1504   Lab Results  Component Value Date   HGBA1C 6.1 (H) 01/04/2019   HGBA1C 6.5 (H) 08/28/2018   HGBA1C 6.5 04/06/2018   HGBA1C 6.0 04/02/2016   HGBA1C 6.1 04/09/2014   Lab Results  Component Value Date   INSULIN 46.3 (H) 08/28/2018   CBC    Component Value Date/Time   WBC 9.7 04/06/2018 0848   RBC 5.02 04/06/2018 0848   HGB 13.4 04/06/2018 0848   HCT 41.3 04/06/2018 0848   PLT 302.0 04/06/2018 0848   MCV 82.2 04/06/2018 0848   MCH 25.5 (L) 04/20/2011 0953   MCHC 32.5 04/06/2018 0848   RDW 17.4 (H) 04/06/2018 0848   LYMPHSABS 2.0 04/06/2018 0848   MONOABS 1.0 04/06/2018 0848   EOSABS 0.2 04/06/2018 0848   BASOSABS 0.0 04/06/2018 0848   Iron/TIBC/Ferritin/ %Sat    Component Value Date/Time   IRON 150 12/15/2011 1507   FERRITIN 9.2 (L) 12/15/2011 1507   IRONPCTSAT 35.3 12/15/2011 1507   Lipid Panel     Component Value Date/Time  CHOL 175 04/06/2018 0848   TRIG 149.0 04/06/2018  0848   HDL 43.50 04/06/2018 0848   CHOLHDL 4 04/06/2018 0848   VLDL 29.8 04/06/2018 0848   LDLCALC 102 (H) 04/06/2018 0848   Hepatic Function Panel     Component Value Date/Time   PROT 8.0 01/04/2019 1504   ALBUMIN 4.0 01/04/2019 1504   AST 19 01/04/2019 1504   ALT 20 01/04/2019 1504   ALKPHOS 68 01/04/2019 1504   BILITOT 0.7 01/04/2019 1504   BILIDIR 0.1 04/02/2016 0720   IBILI 0.3 04/17/2010 2157      Component Value Date/Time   TSH 3.05 04/06/2018 0848   TSH 1.63 02/16/2018 1655   TSH 3.70 04/02/2016 0720   Results for JUNIOR, FETTERHOFF (MRN [id number]) as of 01/08/2019 09:49  Ref. Range 08/28/2018 11:14  Vitamin D, 25-Hydroxy Latest Ref Range: 30.0 - 100.0 ng/mL 17.1 (L)   OBESITY BEHAVIORAL INTERVENTION VISIT  Today's visit was #8  Starting weight: 319 lbs Starting date: 08/28/2018 Today's weight: 285 lbs  Today's date: 01/04/2019 Total lbs lost to date: 34 At least 15 minutes were spent on discussing the following behavioral intervention visit.    01/04/2019  Height 5\' 11"  (1.803 m)  Weight 285 lb (129.3 kg)  BMI (Calculated) 39.77  BLOOD PRESSURE - SYSTOLIC [id number]  BLOOD PRESSURE - DIASTOLIC 77   Body Fat % 123456 %  Total Body Water (lbs) 118.6 lbs   ASK: We discussed the diagnosis of obesity with Jonathan [name] today and Jonathan [name] agreed to give [name] permission to discuss obesity behavioral modification therapy today.  ASSESS: Jonathan [name] has the diagnosis of obesity and his BMI today is 39.8. Jonathan [name] is in the action stage of change.  ADVISE: Jonathan [name] was educated on the multiple health risks of obesity as well as the benefit of weight loss to improve his health. He was advised of the need for long term treatment and the importance of lifestyle modifications to improve his current health and to decrease his risk of future health problems.  AGREE: Multiple dietary modification options and treatment options were discussed and  Jonathan [name] agreed to follow the recommendations  documented in the above note.  ARRANGE: Jonathan [name] was educated on the importance of frequent visits to treat obesity as outlined per CMS and USPSTF guidelines and agreed to schedule his next follow up appointment today.  Jonathan [name], am acting as Location manager for [employer], DO  I have reviewed the above documentation for accuracy and completeness, and I agree with the above. -[name], DO

## 2019-01-18 ENCOUNTER — Other Ambulatory Visit: Payer: Self-pay

## 2019-01-18 ENCOUNTER — Encounter (INDEPENDENT_AMBULATORY_CARE_PROVIDER_SITE_OTHER): Payer: Self-pay | Admitting: Bariatrics

## 2019-01-18 ENCOUNTER — Ambulatory Visit (INDEPENDENT_AMBULATORY_CARE_PROVIDER_SITE_OTHER): Payer: Medicare Other | Admitting: Bariatrics

## 2019-01-18 VITALS — BP 131/71 | HR 50 | Temp 97.9°F | Ht 71.0 in | Wt 287.0 lb

## 2019-01-18 DIAGNOSIS — R7303 Prediabetes: Secondary | ICD-10-CM

## 2019-01-18 DIAGNOSIS — Z9189 Other specified personal risk factors, not elsewhere classified: Secondary | ICD-10-CM | POA: Diagnosis not present

## 2019-01-18 DIAGNOSIS — E559 Vitamin D deficiency, unspecified: Secondary | ICD-10-CM

## 2019-01-18 DIAGNOSIS — Z6841 Body Mass Index (BMI) 40.0 and over, adult: Secondary | ICD-10-CM

## 2019-01-18 DIAGNOSIS — I10 Essential (primary) hypertension: Secondary | ICD-10-CM | POA: Diagnosis not present

## 2019-01-21 NOTE — Progress Notes (Signed)
Office: [phone]  /  Fax: [phone]   HPI:   Chief Complaint: OBESITY Jonathan [name] is here to discuss his progress with his obesity treatment plan. He is on the keep a food journal with 500-700 calories and 45 grams of protein at supper daily and follow the Category 4 plan and is following his eating plan approximately 100 % of the time. He states he is walking for 20-30 minutes 2 times per week. Jonathan [name] is up 2 lbs but he is doing well overall.  His weight is 287 lb (130.2 kg) today and has gained 2 lbs since his last visit. He has lost 32 lbs since starting treatment with [name].  Hypertension Jonathan [name] is a 63 y.o. male with hypertension. Jonathan [name]'s blood pressure was 131/77, well controlled. He denies chest pain. He is working on weight loss to help control his blood pressure with the goal of decreasing his risk of heart attack and stroke.   [name] has a diagnosis of vitamin D deficiency. He is currently taking OTC Vit D. Last Vit D level was 47.3. He denies nausea, vomiting or muscle weakness.  At risk for osteopenia and osteoporosis Jonathan [name] is at higher risk of osteopenia and osteoporosis due to vitamin D deficiency.   Pre-Diabetes Jonathan [name] has a diagnosis of pre-diabetes based on his elevated Hgb A1c and was informed this puts him at greater risk of developing diabetes. Last A1c was 6.1 and insulin of 46.3. He is not taking metformin currently and continues to work on diet and exercise to decrease risk of diabetes. He denies hypoglycemia.  ASSESSMENT AND PLAN:  Essential hypertension  Vitamin D deficiency  Prediabetes  At risk for osteoporosis  Class 3 severe obesity with serious comorbidity and body mass index (BMI) of 40.0 to 44.9 in adult, unspecified obesity type ([place])  PLAN:  Hypertension We discussed sodium restriction, working on healthy weight loss, and a regular exercise program as the means to achieve improved blood pressure control. Jonathan [name]  agreed with this plan and agreed to follow up as directed. We will continue to monitor his blood pressure as well as his progress with the above lifestyle modifications. Jonathan [name] agrees to continue his medications and will watch for signs of hypotension as he continues his lifestyle modifications. Jonathan [name] agrees to follow up with our clinic in 2 weeks.  [name] was informed that low vitamin D levels contributes to fatigue and are associated with obesity, breast, and colon cancer. Jonathan [name] agrees to continue taking OTC Vit D 2,000 IU daily and will follow up for routine testing of vitamin D, at least 2-3 times per year. He was informed of the risk of over-replacement of vitamin D and agrees to not increase his dose unless he discusses this with [name] first. Jonathan [name] agrees to follow up with our clinic in 2 weeks.  At risk for osteopenia and osteoporosis Jonathan [name] was given extended (15 minutes) osteoporosis prevention counseling today. Jonathan [name] is at risk for osteopenia and osteoporsis due to his vitamin D deficiency. He was encouraged to take his vitamin D and follow his higher calcium diet and increase strengthening exercise to help strengthen his bones and decrease his risk of osteopenia and osteoporosis.  Pre-Diabetes Jonathan [name] will continue to work on weight loss, exercise, increase protein, and decreasing simple carbohydrates in his diet to help decrease the risk of diabetes. We dicussed metformin including benefits and risks. He was informed that eating too many simple carbohydrates or too many calories at  one sitting increases the likelihood of GI side effects. Jonathan [name] agrees to follow up with our clinic in 2 weeks as directed to monitor his progress.  Obesity Jonathan [name] is currently in the action stage of change. As such, his goal is to continue with weight loss efforts He has agreed to follow a lower carbohydrate, vegetable and lean protein rich diet plan Jonathan [name] has been instructed to work up  to a goal of 150 minutes of combined cardio and strengthening exercise per week or continue walking for weight loss and overall health benefits. We discussed the following Behavioral Modification Strategies today: increasing lean protein intake, decreasing simple carbohydrates, increasing vegetables, decrease eating out, increase H20 intake, no skipping meals, work on meal planning and easy cooking plans, and keeping healthy foods in the home   Jonathan [name] has agreed to follow up with our clinic in 2 weeks. He was informed of the importance of frequent follow up visits to maximize his success with intensive lifestyle modifications for his multiple health conditions.  ALLERGIES: Allergies  Allergen Reactions  . Codeine     REACTION: emesis  . Cozaar [Losartan Potassium] Hives    MEDICATIONS: Current Outpatient Medications on File Prior to Visit  Medication Sig Dispense Refill  . amLODipine (NORVASC) 5 MG tablet TAKE 1 TABLET BY MOUTH EVERY DAY 90 tablet 1  . Ascorbic Acid (VITAMIN C) 1000 MG tablet Take 1,000 mg by mouth daily.    [name] atenolol-chlorthalidone (TENORETIC) 50-25 MG tablet TAKE 1 TABLET BY MOUTH EVERY DAY 90 tablet 2  . Cholecalciferol (VITAMIN D3) 250 MCG (10000 UT) TABS Take by mouth.    . Echinacea 500 MG CAPS Take by mouth.    [name] ibuprofen (ADVIL) 200 MG tablet Take 200 mg by mouth every 6 (six) hours as needed.    . mupirocin ointment (BACTROBAN) 2 % Apply 1 application topically 3 (three) times daily. 15 g 0  . potassium chloride (K-DUR) 10 MEQ tablet Take 1 tablet (10 mEq total) by mouth daily. 30 tablet 0  . Vitamin D, Ergocalciferol, (DRISDOL) 1.25 MG (50000 UT) CAPS capsule Take 1 capsule (50,000 Units total) by mouth every 7 (seven) days. 4 capsule 0  . zinc gluconate 50 MG tablet Take 50 mg by mouth daily.     No current facility-administered medications on file prior to visit.     PAST MEDICAL HISTORY: Past Medical History:  Diagnosis Date  . Anxiety   . Arthritis    . Asthma   . BPH (benign prostatic hyperplasia)   . Chest pain   . ED (erectile dysfunction)   . Elevated PSA    9.33  . Esophageal reflux   . Hearing loss   . History of hip replacement   . History of knee replacement   . Hypertension   . Joint pain   . Lactose intolerance   . Numbness in both legs   . Pre-diabetes   . Prostate cancer [facility]) Jan 2016   prostatectomy  . Sleep apnea   . Vitamin D deficiency     PAST SURGICAL HISTORY: Past Surgical History:  Procedure Laterality Date  . CORONARY CALCIUM SCORE / CARDIAC CT ANGIOGRAM  10/24/2017   Cor Ca Score = 0 - Low risk. Cor CTA: only mild plaque noted in mRCA with mLAD intramyocardial bridging noted.  [name] FOOT SURGERY     bilateral foot  . NM MYOVIEW LTD  07/2007   Normal nuclear stress test.  Mild fixed defect in the anterior wall  likely related to soft tissue attenuation.  Normal EF.  Fair exercise capacity.  [name] PROSTATE BIOPSY  06/13/2012   right lat mid gleason 3+3=6  . PROSTATECTOMY  2016  . TOTAL HIP ARTHROPLASTY  2008 &2007   Right and left    SOCIAL HISTORY: Social History   Tobacco Use  . Smoking status: Never Smoker  . Smokeless tobacco: Never Used  Substance Use Topics  . Alcohol use: No  . Drug use: No    FAMILY HISTORY: Family History  Problem Relation Age of Onset  . Hypertension Mother   . Colon cancer Mother   . Obesity Mother   . Prostate cancer Father 51       alive now age 67  . Asthma Father   . Heart disease Father        Pt is not sure of details (no MI)  . Alcoholism Father   . Obesity Father   . Lung cancer Maternal Grandmother        non-smoker  . Tuberculosis Brother     ROS: Review of Systems  Constitutional: Negative for weight loss.  Cardiovascular: Negative for chest pain.  Gastrointestinal: Negative for nausea and vomiting.  Musculoskeletal:       Negative muscle weakness  Endo/Heme/Allergies:       Negative hypoglycemia    PHYSICAL EXAM: Blood pressure 131/71,  pulse (!) 50, temperature 97.9 F (36.6 C), temperature source Oral, height 5\' 11"  (1.803 m), weight 287 lb (130.2 kg). Body mass index is 40.03 kg/m. Physical Exam Vitals signs reviewed.  Constitutional:      Appearance: Normal appearance. He is obese.  Cardiovascular:     Rate and Rhythm: Normal rate.     Pulses: Normal pulses.  Pulmonary:     Effort: Pulmonary effort is normal.     Breath sounds: Normal breath sounds.  Musculoskeletal: Normal range of motion.  Skin:    General: Skin is warm and dry.  Neurological:     Mental Status: He is alert and oriented to person, place, and time.  Psychiatric:        Mood and Affect: Mood normal.        Behavior: Behavior normal.     RECENT LABS AND TESTS: BMET    Component Value Date/Time   NA 137 01/04/2019 1504   K 3.9 01/04/2019 1504   CL 93 (L) 01/04/2019 1504   CO2 31 (H) 01/04/2019 1504   GLUCOSE 91 01/04/2019 1504   GLUCOSE 110 (H) 04/06/2018 0848   BUN 12 01/04/2019 1504   CREATININE 0.99 01/04/2019 1504   CALCIUM 9.8 01/04/2019 1504   GFRNONAA 81 01/04/2019 1504   GFRAA 94 01/04/2019 1504   Lab Results  Component Value Date   HGBA1C 6.1 (H) 01/04/2019   HGBA1C 6.5 (H) 08/28/2018   HGBA1C 6.5 04/06/2018   HGBA1C 6.0 04/02/2016   HGBA1C 6.1 04/09/2014   Lab Results  Component Value Date   INSULIN 46.3 (H) 08/28/2018   CBC    Component Value Date/Time   WBC 9.7 04/06/2018 0848   RBC 5.02 04/06/2018 0848   HGB 13.4 04/06/2018 0848   HCT 41.3 04/06/2018 0848   PLT 302.0 04/06/2018 0848   MCV 82.2 04/06/2018 0848   MCH 25.5 (L) 04/20/2011 0953   MCHC 32.5 04/06/2018 0848   RDW 17.4 (H) 04/06/2018 0848   LYMPHSABS 2.0 04/06/2018 0848   MONOABS 1.0 04/06/2018 0848   EOSABS 0.2 04/06/2018 0848   BASOSABS 0.0 04/06/2018 0848  Iron/TIBC/Ferritin/ %Sat    Component Value Date/Time   IRON 150 12/15/2011 1507   FERRITIN 9.2 (L) 12/15/2011 1507   IRONPCTSAT 35.3 12/15/2011 1507   Lipid Panel      Component Value Date/Time   CHOL 175 04/06/2018 0848   TRIG 149.0 04/06/2018 0848   HDL 43.50 04/06/2018 0848   CHOLHDL 4 04/06/2018 0848   VLDL 29.8 04/06/2018 0848   LDLCALC 102 (H) 04/06/2018 0848   Hepatic Function Panel     Component Value Date/Time   PROT 8.0 01/04/2019 1504   ALBUMIN 4.0 01/04/2019 1504   AST 19 01/04/2019 1504   ALT 20 01/04/2019 1504   ALKPHOS 68 01/04/2019 1504   BILITOT 0.7 01/04/2019 1504   BILIDIR 0.1 04/02/2016 0720   IBILI 0.3 04/17/2010 2157      Component Value Date/Time   TSH 3.05 04/06/2018 0848   TSH 1.63 02/16/2018 1655   TSH 3.70 04/02/2016 0720      OBESITY BEHAVIORAL INTERVENTION VISIT  Today's visit was # 9   Starting weight: 319 lbs Starting date: 08/28/2018 Today's weight : 287 lbs Today's date: 01/18/2019 Total lbs lost to date: 71    ASK: We discussed the diagnosis of obesity with Jonathan [name] today and Jonathan [name] agreed to give [name] permission to discuss obesity behavioral modification therapy today.  ASSESS: Damarcus has the diagnosis of obesity and his BMI today is 40.05 Gavriel is in the action stage of change   ADVISE: Peyson was educated on the multiple health risks of obesity as well as the benefit of weight loss to improve his health. He was advised of the need for long term treatment and the importance of lifestyle modifications to improve his current health and to decrease his risk of future health problems.  AGREE: Multiple dietary modification options and treatment options were discussed and  Toma agreed to follow the recommendations documented in the above note.  ARRANGE: Zahmari was educated on the importance of frequent visits to treat obesity as outlined per CMS and USPSTF guidelines and agreed to schedule his next follow up appointment today.  Wilhemena Durie, am acting as transcriptionist for [employer], DO  I have reviewed the above documentation for accuracy and completeness, and I agree with the  above. -[name], DO

## 2019-01-23 ENCOUNTER — Encounter (INDEPENDENT_AMBULATORY_CARE_PROVIDER_SITE_OTHER): Payer: Self-pay | Admitting: Bariatrics

## 2019-01-26 ENCOUNTER — Other Ambulatory Visit (INDEPENDENT_AMBULATORY_CARE_PROVIDER_SITE_OTHER): Payer: Self-pay | Admitting: Bariatrics

## 2019-01-26 DIAGNOSIS — E559 Vitamin D deficiency, unspecified: Secondary | ICD-10-CM

## 2019-01-28 ENCOUNTER — Other Ambulatory Visit (INDEPENDENT_AMBULATORY_CARE_PROVIDER_SITE_OTHER): Payer: Self-pay | Admitting: Bariatrics

## 2019-01-28 DIAGNOSIS — I10 Essential (primary) hypertension: Secondary | ICD-10-CM

## 2019-02-05 ENCOUNTER — Ambulatory Visit (INDEPENDENT_AMBULATORY_CARE_PROVIDER_SITE_OTHER): Payer: Medicare Other | Admitting: Bariatrics

## 2019-02-05 ENCOUNTER — Encounter: Payer: Self-pay | Admitting: Bariatrics

## 2019-02-05 ENCOUNTER — Other Ambulatory Visit: Payer: Self-pay

## 2019-02-05 ENCOUNTER — Encounter (INDEPENDENT_AMBULATORY_CARE_PROVIDER_SITE_OTHER): Payer: Self-pay | Admitting: Bariatrics

## 2019-02-05 VITALS — BP 123/71 | HR 49 | Temp 98.5°F | Ht 71.0 in | Wt 285.0 lb

## 2019-02-05 DIAGNOSIS — R5383 Other fatigue: Secondary | ICD-10-CM | POA: Diagnosis not present

## 2019-02-05 DIAGNOSIS — R7303 Prediabetes: Secondary | ICD-10-CM | POA: Diagnosis not present

## 2019-02-05 DIAGNOSIS — I10 Essential (primary) hypertension: Secondary | ICD-10-CM | POA: Diagnosis not present

## 2019-02-05 DIAGNOSIS — Z6839 Body mass index (BMI) 39.0-39.9, adult: Secondary | ICD-10-CM

## 2019-02-05 DIAGNOSIS — R0602 Shortness of breath: Secondary | ICD-10-CM

## 2019-02-06 ENCOUNTER — Encounter (INDEPENDENT_AMBULATORY_CARE_PROVIDER_SITE_OTHER): Payer: Self-pay | Admitting: Bariatrics

## 2019-02-06 NOTE — Progress Notes (Signed)
Office: [phone]  /  Fax: [phone]   HPI:   Chief Complaint: OBESITY Jonathan [name] is here to discuss his progress with his obesity treatment plan. He is following a lower carbohydrate, vegetable and lean protein rich diet plan and is following his eating plan approximately 95% of the time. He states he is doing cardio/weights 60 minutes 3 times per week. Jonathan [name] is down 2 lbs. He has been on the low carb eating plan, but is eating more. He reports drinking adequate water.  His weight is 285 lb (129.3 kg) today and has had a weight loss of 2 pounds over a period of 3 weeks since his last visit. He has lost 34 lbs since starting treatment with [name].  Pre-Diabetes Jonathan [name] has a diagnosis of prediabetes based on his elevated Hgb A1c and was informed this puts him at greater risk of developing diabetes. Last A1c 6.1 on 01/04/2019 with an insulin of 46.3 on 08/28/2018. He is not taking metformin currently and continues to work on diet and exercise to decrease risk of diabetes. He denies nausea or hypoglycemia.  Hypertension Jonathan [name] is a 63 y.o. male with hypertension. Jonathan [name] denies chest pain or shortness of breath on exertion. He is working weight loss to help control his blood pressure with the goal of decreasing his risk of heart attack and stroke. Corgan's blood pressure is well controlled.  Fatigue and Shortness of Breath Jonathan [name] notes increasing shortness of breath with exercising and seems to be worsening over time with weight gain. He notes getting out of breath sooner with activity than he used to.  Jonathan [name] denies shortness of breath at rest or orthopnea.  ASSESSMENT AND PLAN:  Prediabetes  Essential hypertension  Shortness of breath on exertion  Other fatigue  Class 2 severe obesity with serious comorbidity and body mass index (BMI) of 39.0 to 39.9 in adult, unspecified obesity type (Jonathan [name])  PLAN:  Pre-Diabetes Jonathan [name] will continue to work on weight loss,  exercise, and decreasing simple carbohydrates in his diet to help decrease the risk of diabetes. We dicussed metformin including benefits and risks. He was informed that eating too many simple carbohydrates or too many calories at one sitting increases the likelihood of GI side effects. Jonathan [name] will decrease carbohydrates and increase exercise. He will follow-up as directed.  Hypertension We discussed sodium restriction, working on healthy weight loss, and a regular exercise program as the means to achieve improved blood pressure control. Jonathan [name] agreed with this plan and agreed to follow up as directed. We will continue to monitor his blood pressure as well as his progress with the above lifestyle modifications. He will continue his medications as prescribed and will watch for signs of hypotension as he continues his lifestyle modifications.  Fatigue and Shortness of Breath Jonathan [name] shortness of breath appears to be obesity related and exercise induced. The indirect calorimeter results showed VO2 of 276 and a REE of 1924 (REE 2177 on 08/28/2018). He has agreed to work on weight loss and gradually increase exercise to treat his exercise induced shortness of breath (cardiac and resistance). If Jonathan [name] follows our instructions and loses weight without improvement of his shortness of breath, we will plan to refer to pulmonology. Jonathan [name] agrees to this plan.  I spent > than 50% of the 15 minute visit on counseling as documented in the note.  Obesity Jonathan [name] is currently in the action stage of change. As such, his goal is to continue with weight loss efforts. He has  agreed to follow the Category 3 plan with additional Category 3 breakfast options. Jonathan [name] will increase activity (cardio and resistance), increase protein, and drink water with lemon. Jonathan [name] has been instructed to work up to a goal of 150 minutes of combined cardio and strengthening exercise per week for weight loss and overall health benefits. We  discussed the following Behavioral Modification Strategies today: increasing lean protein intake, decreasing simple carbohydrates, increasing vegetables, increase H20 intake, decrease eating out, no skipping meals, work on meal planning and easy cooking plans, keeping healthy foods in the home, and planning for success.  Amon has agreed to follow-up with our clinic in 2 weeks. He was informed of the importance of frequent follow-up visits to maximize his success with intensive lifestyle modifications for his multiple health conditions.  ALLERGIES: Allergies  Allergen Reactions  . Codeine     REACTION: emesis  . Cozaar [Losartan Potassium] Hives    MEDICATIONS: Current Outpatient Medications on File Prior to Visit  Medication Sig Dispense Refill  . amLODipine (NORVASC) 5 MG tablet TAKE 1 TABLET BY MOUTH EVERY DAY 90 tablet 1  . Ascorbic Acid (VITAMIN C) 1000 MG tablet Take 1,000 mg by mouth daily.    [name] atenolol-chlorthalidone (TENORETIC) 50-25 MG tablet TAKE 1 TABLET BY MOUTH EVERY DAY 90 tablet 2  . Cholecalciferol (VITAMIN D3) 250 MCG (10000 UT) TABS Take by mouth.    . Echinacea 500 MG CAPS Take by mouth.    [name] ibuprofen (ADVIL) 200 MG tablet Take 200 mg by mouth every 6 (six) hours as needed.    . mupirocin ointment (BACTROBAN) 2 % Apply 1 application topically 3 (three) times daily. 15 g 0  . potassium chloride (K-DUR) 10 MEQ tablet Take 1 tablet (10 mEq total) by mouth daily. 30 tablet 0  . Vitamin D, Ergocalciferol, (DRISDOL) 1.25 MG (50000 UT) CAPS capsule Take 1 capsule (50,000 Units total) by mouth every 7 (seven) days. 4 capsule 0  . zinc gluconate 50 MG tablet Take 50 mg by mouth daily.     No current facility-administered medications on file prior to visit.     PAST MEDICAL HISTORY: Past Medical History:  Diagnosis Date  . Anxiety   . Arthritis   . Asthma   . BPH (benign prostatic hyperplasia)   . Chest pain   . ED (erectile dysfunction)   . Elevated PSA    9.33   . Esophageal reflux   . Hearing loss   . History of hip replacement   . History of knee replacement   . Hypertension   . Joint pain   . Lactose intolerance   . Numbness in both legs   . Pre-diabetes   . Prostate cancer [facility]) Jan 2016   prostatectomy  . Sleep apnea   . Vitamin D deficiency     PAST SURGICAL HISTORY: Past Surgical History:  Procedure Laterality Date  . CORONARY CALCIUM SCORE / CARDIAC CT ANGIOGRAM  10/24/2017   Cor Ca Score = 0 - Low risk. Cor CTA: only mild plaque noted in mRCA with mLAD intramyocardial bridging noted.  [name] FOOT SURGERY     bilateral foot  . NM MYOVIEW LTD  07/2007   Normal nuclear stress test.  Mild fixed defect in the anterior wall likely related to soft tissue attenuation.  Normal EF.  Fair exercise capacity.  [name] PROSTATE BIOPSY  06/13/2012   right lat mid gleason 3+3=6  . PROSTATECTOMY  2016  . TOTAL HIP ARTHROPLASTY  2008 &2007  Right and left    SOCIAL HISTORY: Social History   Tobacco Use  . Smoking status: Never Smoker  . Smokeless tobacco: Never Used  Substance Use Topics  . Alcohol use: No  . Drug use: No    FAMILY HISTORY: Family History  Problem Relation Age of Onset  . Hypertension Mother   . Colon cancer Mother   . Obesity Mother   . Prostate cancer Father 4       alive now age 6  . Asthma Father   . Heart disease Father        Pt is not sure of details (no MI)  . Alcoholism Father   . Obesity Father   . Lung cancer Maternal Grandmother        non-smoker  . Tuberculosis Brother    ROS: Review of Systems  Constitutional: Positive for malaise/fatigue.  Respiratory: Negative for shortness of breath.   Cardiovascular: Negative for chest pain and orthopnea.  Gastrointestinal: Negative for nausea.  Endo/Heme/Allergies:       Negative for hypoglycemia.   PHYSICAL EXAM: Blood pressure 123/71, pulse (!) 49, temperature 98.5 F (36.9 C), height 5\' 11"  (1.803 m), weight 285 lb (129.3 kg), SpO2 97 %. Body mass  index is 39.75 kg/m. Physical Exam Vitals signs reviewed.  Constitutional:      Appearance: Normal appearance. He is obese.  Cardiovascular:     Rate and Rhythm: Normal rate.     Pulses: Normal pulses.  Pulmonary:     Effort: Pulmonary effort is normal.     Breath sounds: Normal breath sounds.  Musculoskeletal: Normal range of motion.  Skin:    General: Skin is warm and dry.  Neurological:     Mental Status: He is alert and oriented to person, place, and time.  Psychiatric:        Behavior: Behavior normal.   RECENT LABS AND TESTS: BMET    Component Value Date/Time   NA 137 01/04/2019 1504   K 3.9 01/04/2019 1504   CL 93 (L) 01/04/2019 1504   CO2 31 (H) 01/04/2019 1504   GLUCOSE 91 01/04/2019 1504   GLUCOSE 110 (H) 04/06/2018 0848   BUN 12 01/04/2019 1504   CREATININE 0.99 01/04/2019 1504   CALCIUM 9.8 01/04/2019 1504   GFRNONAA 81 01/04/2019 1504   GFRAA 94 01/04/2019 1504   Lab Results  Component Value Date   HGBA1C 6.1 (H) 01/04/2019   HGBA1C 6.5 (H) 08/28/2018   HGBA1C 6.5 04/06/2018   HGBA1C 6.0 04/02/2016   HGBA1C 6.1 04/09/2014   Lab Results  Component Value Date   INSULIN 46.3 (H) 08/28/2018   CBC    Component Value Date/Time   WBC 9.7 04/06/2018 0848   RBC 5.02 04/06/2018 0848   HGB 13.4 04/06/2018 0848   HCT 41.3 04/06/2018 0848   PLT 302.0 04/06/2018 0848   MCV 82.2 04/06/2018 0848   MCH 25.5 (L) 04/20/2011 0953   MCHC 32.5 04/06/2018 0848   RDW 17.4 (H) 04/06/2018 0848   LYMPHSABS 2.0 04/06/2018 0848   MONOABS 1.0 04/06/2018 0848   EOSABS 0.2 04/06/2018 0848   BASOSABS 0.0 04/06/2018 0848   Iron/TIBC/Ferritin/ %Sat    Component Value Date/Time   IRON 150 12/15/2011 1507   FERRITIN 9.2 (L) 12/15/2011 1507   IRONPCTSAT 35.3 12/15/2011 1507   Lipid Panel     Component Value Date/Time   CHOL 175 04/06/2018 0848   TRIG 149.0 04/06/2018 0848   HDL 43.50 04/06/2018 0848  CHOLHDL 4 04/06/2018 0848   VLDL 29.8 04/06/2018 0848    LDLCALC 102 (H) 04/06/2018 0848   Hepatic Function Panel     Component Value Date/Time   PROT 8.0 01/04/2019 1504   ALBUMIN 4.0 01/04/2019 1504   AST 19 01/04/2019 1504   ALT 20 01/04/2019 1504   ALKPHOS 68 01/04/2019 1504   BILITOT 0.7 01/04/2019 1504   BILIDIR 0.1 04/02/2016 0720   IBILI 0.3 04/17/2010 2157      Component Value Date/Time   TSH 3.05 04/06/2018 0848   TSH 1.63 02/16/2018 1655   TSH 3.70 04/02/2016 0720   Results for SHEADON, POSAS (MRN [id number]) as of 02/06/2019 08:07  Ref. Range 01/04/2019 15:04  Vitamin D, 25-Hydroxy Latest Ref Range: 30.0 - 100.0 ng/mL 47.3   OBESITY BEHAVIORAL INTERVENTION VISIT  Today's visit was #10  Starting weight: 319 lbs Starting date: 08/28/2018 Today's weight: 285 lbs  Today's date: 02/05/2019 Total lbs lost to date: 34    02/05/2019  Height 5\' 11"  (1.803 m)  Weight 285 lb (129.3 kg)  BMI (Calculated) 39.77  BLOOD PRESSURE - SYSTOLIC [id number]  BLOOD PRESSURE - DIASTOLIC 71   Body Fat % [id number] %  Total Body Water (lbs) 122.4 lbs  RMR 1924   ASK: We discussed the diagnosis of obesity with Jonathan [name] today and [name] agreed to give [name] permission to discuss obesity behavioral modification therapy today.  ASSESS: Braylen has the diagnosis of obesity and his BMI today is 39.8. Allister is in the action stage of change.  ADVISE: Ohn was educated on the multiple health risks of obesity as well as the benefit of weight loss to improve his health. He was advised of the need for long term treatment and the importance of lifestyle modifications to improve his current health and to decrease his risk of future health problems.  AGREE: Multiple dietary modification options and treatment options were discussed and  Karon agreed to follow the recommendations documented in the above note.  ARRANGE: Danyale was educated on the importance of frequent visits to treat obesity as outlined per CMS and USPSTF guidelines and agreed to  schedule his next follow up appointment today.  [name], am acting as Location manager for [employer], DO  I have reviewed the above documentation for accuracy and completeness, and I agree with the above. -Jonathan Lesch, DO

## 2019-02-13 ENCOUNTER — Telehealth: Payer: Self-pay | Admitting: Pulmonary Disease

## 2019-02-13 ENCOUNTER — Telehealth: Payer: Self-pay | Admitting: *Deleted

## 2019-02-13 NOTE — Telephone Encounter (Signed)
Copied from [place] [phone]. Topic: General - Other >> Feb 13, 2019  1:42 PM Jonathan [name] wrote: Reason for CRM: pt called in to schedule his cpe, last 04/06/18. Made pt aware of PCP next available (February) he would like to know if there is a way that provider could complete cpe sooner?

## 2019-02-13 NOTE — Telephone Encounter (Signed)
Spoke with pt, he states the air in his CPAP seems less than when he first started. I pulled his report on airview and his AHI is 3.7. Dr. Ander Slade can you see if the patient needs to adjust his pressures on his CPAP. I will place download in your box.

## 2019-02-13 NOTE — Telephone Encounter (Signed)
Pt notified there is no availability and scheduled for January 2021.  Nothing further needed.

## 2019-02-14 NOTE — Telephone Encounter (Signed)
Governor Specking Download  Dr. Ander Slade, see if you can click on the link above to view patient's download.

## 2019-02-14 NOTE — Telephone Encounter (Signed)
AHI of 3.7 will be consistent with optimal treatment anything below 5 is optimal treatment I will try and review as soon as able to  Is it possible to have this download/compliance report scanned into media-part of his chart  This is the only way that I can review it without having to come physically to the office -As a general rule-this will help,  will not have people waiting until we can make it to the office

## 2019-02-19 ENCOUNTER — Telehealth: Payer: Self-pay | Admitting: Pulmonary Disease

## 2019-02-19 DIAGNOSIS — Z9989 Dependence on other enabling machines and devices: Secondary | ICD-10-CM

## 2019-02-19 DIAGNOSIS — G4733 Obstructive sleep apnea (adult) (pediatric): Secondary | ICD-10-CM

## 2019-02-19 NOTE — Telephone Encounter (Signed)
I reviewed his CPAP compliance  Machine seems to be getting to maximum pressure that auto titration was set for  We will change his pressure settings from 5-15 to 5-20  Order to DME to change machine pressures to 5-20

## 2019-02-20 NOTE — Telephone Encounter (Signed)
Dr. Olalere, please advise. Thanks 

## 2019-02-20 NOTE — Telephone Encounter (Signed)
DME order placed for cpap settings to be changed to 5-20. Called and spoke with Patient.  Dr.Olalere's recommendations given.  Understanding stated.  Nothing further at this time.

## 2019-02-22 NOTE — Telephone Encounter (Signed)
Dr. Olalere, please advise. 

## 2019-02-27 ENCOUNTER — Ambulatory Visit (INDEPENDENT_AMBULATORY_CARE_PROVIDER_SITE_OTHER): Payer: Medicare Other | Admitting: Bariatrics

## 2019-02-27 ENCOUNTER — Encounter (INDEPENDENT_AMBULATORY_CARE_PROVIDER_SITE_OTHER): Payer: Self-pay | Admitting: Bariatrics

## 2019-02-27 ENCOUNTER — Other Ambulatory Visit: Payer: Self-pay

## 2019-02-27 VITALS — BP 127/79 | HR 52 | Temp 98.5°F | Ht 71.0 in | Wt 280.0 lb

## 2019-02-27 DIAGNOSIS — E876 Hypokalemia: Secondary | ICD-10-CM | POA: Diagnosis not present

## 2019-02-27 DIAGNOSIS — G4733 Obstructive sleep apnea (adult) (pediatric): Secondary | ICD-10-CM | POA: Diagnosis not present

## 2019-02-27 DIAGNOSIS — Z6839 Body mass index (BMI) 39.0-39.9, adult: Secondary | ICD-10-CM | POA: Diagnosis not present

## 2019-02-27 DIAGNOSIS — R7303 Prediabetes: Secondary | ICD-10-CM | POA: Diagnosis not present

## 2019-02-27 DIAGNOSIS — Z9989 Dependence on other enabling machines and devices: Secondary | ICD-10-CM

## 2019-02-27 MED ORDER — POTASSIUM CHLORIDE ER 10 MEQ PO TBCR: 10.0000 meq | EXTENDED_RELEASE_TABLET | Freq: Every day | ORAL | 0 refills | Status: DC

## 2019-02-27 NOTE — Telephone Encounter (Signed)
Is patient still having problems despite change to pressure 5-20?

## 2019-02-27 NOTE — Progress Notes (Signed)
Office: [phone]  /  Fax: [phone]   HPI:   Chief Complaint: OBESITY [name] is here to discuss his progress with his obesity treatment plan. He is on the Category 3 plan and is following his eating plan approximately 95% of the time. He states he is doing strength/cardio 5.5 hours per week.  Jonathan [name] is down 5 lbs and doing well overall. He is doing well with his protein intake.  His weight is 280 lb (127 kg) today and has had a weight loss of 5 pounds over a period of 3 weeks since his last visit. He has lost 39 lbs since starting treatment with [name].  Obstructive Sleep Apnea (OSA) Jonathan [name] has a diagnosis of obstructive sleep apnea and is on CPAP. He reports wearing his CPAP.  Pre-Diabetes Jonathan [name] has a diagnosis of prediabetes (controlled) based on his elevated Hgb A1c and was informed this puts him at greater risk of developing diabetes. Last A1c 6.1 on 01/04/2019 with an insulin of 46.3 on 08/28/2018. He continues to work on diet and exercise to decrease risk of diabetes. He denies nausea or hypoglycemia.  [name] has a diagnosis of hypokalemia and denies any side effects.  ASSESSMENT AND PLAN:  Prediabetes  OSA on CPAP  Hypokalemia - Plan: potassium chloride (KLOR-CON) 10 MEQ tablet  Class 2 severe obesity with serious comorbidity and body mass index (BMI) of 39.0 to 39.9 in adult, unspecified obesity type (Jonathan [name])  PLAN:  Obstructive Sleep Apnea (OSA) Jonathan [name] is instructed to continue to use his CPAP. We discussed benefits and how wearing CPAP can impact weight loss.  Pre-Diabetes Jonathan [name] will continue to work on weight loss, exercise, and decreasing simple carbohydrates in his diet to help decrease the risk of diabetes. We dicussed metformin including benefits and risks. He was informed that eating too many simple carbohydrates or too many calories at one sitting increases the likelihood of GI side effects. Jonathan [name] will decrease carbohydrates and increase protein.   [name] was given a prescription for potassium 10 mEq 1 daily #30 with 0 refills. He agrees to follow-up with our clinic in 2-3 weeks.  Obesity Jonathan [name] is currently in the action stage of change. As such, his goal is to continue with weight loss efforts. He has agreed to follow the Category 3 plan. Jonathan [name] will continue to increase his water intake and will work on meal planning. Jonathan [name] has been instructed to continue strength/cardio, but will increase cardio for weight loss and overall health benefits. We discussed the following Behavioral Modification Stratagies today: increasing lean protein intake, decreasing simple carbohydrates, increasing vegetables, increase H20 intake, decrease eating out, no skipping meals, work on meal planning and easy cooking plans, and keeping healthy foods in the home.  Jonathan [name] has agreed to follow-up with our clinic in 2-3 weeks. He was informed of the importance of frequent follow-up visits to maximize his success with intensive lifestyle modifications for his multiple health conditions.  ALLERGIES: Allergies  Allergen Reactions  . Codeine     REACTION: emesis  . Cozaar [Losartan Potassium] Hives    MEDICATIONS: Current Outpatient Medications on File Prior to Visit  Medication Sig Dispense Refill  . amLODipine (NORVASC) 5 MG tablet TAKE 1 TABLET BY MOUTH EVERY DAY 90 tablet 1  . Ascorbic Acid (VITAMIN C) 1000 MG tablet Take 1,000 mg by mouth daily.    [name] atenolol-chlorthalidone (TENORETIC) 50-25 MG tablet TAKE 1 TABLET BY MOUTH EVERY DAY 90 tablet 2  . Cholecalciferol (VITAMIN D3) 250 MCG (10000  UT) TABS Take by mouth.    . Echinacea 500 MG CAPS Take by mouth.    [name] ibuprofen (ADVIL) 200 MG tablet Take 200 mg by mouth every 6 (six) hours as needed.    . mupirocin ointment (BACTROBAN) 2 % Apply 1 application topically 3 (three) times daily. 15 g 0  . Vitamin D, Ergocalciferol, (DRISDOL) 1.25 MG (50000 UT) CAPS capsule Take 1 capsule (50,000  Units total) by mouth every 7 (seven) days. 4 capsule 0  . zinc gluconate 50 MG tablet Take 50 mg by mouth daily.     No current facility-administered medications on file prior to visit.     PAST MEDICAL HISTORY: Past Medical History:  Diagnosis Date  . Anxiety   . Arthritis   . Asthma   . BPH (benign prostatic hyperplasia)   . Chest pain   . ED (erectile dysfunction)   . Elevated PSA    9.33  . Esophageal reflux   . Hearing loss   . History of hip replacement   . History of knee replacement   . Hypertension   . Joint pain   . Lactose intolerance   . Numbness in both legs   . Pre-diabetes   . Prostate cancer [facility]) Jan 2016   prostatectomy  . Sleep apnea   . Vitamin D deficiency     PAST SURGICAL HISTORY: Past Surgical History:  Procedure Laterality Date  . CORONARY CALCIUM SCORE / CARDIAC CT ANGIOGRAM  10/24/2017   Cor Ca Score = 0 - Low risk. Cor CTA: only mild plaque noted in mRCA with mLAD intramyocardial bridging noted.  [name] FOOT SURGERY     bilateral foot  . NM MYOVIEW LTD  07/2007   Normal nuclear stress test.  Mild fixed defect in the anterior wall likely related to soft tissue attenuation.  Normal EF.  Fair exercise capacity.  [name] PROSTATE BIOPSY  06/13/2012   right lat mid gleason 3+3=6  . PROSTATECTOMY  2016  . TOTAL HIP ARTHROPLASTY  2008 &2007   Right and left    SOCIAL HISTORY: Social History   Tobacco Use  . Smoking status: Never Smoker  . Smokeless tobacco: Never Used  Substance Use Topics  . Alcohol use: No  . Drug use: No    FAMILY HISTORY: Family History  Problem Relation Age of Onset  . Hypertension Mother   . Colon cancer Mother   . Obesity Mother   . Prostate cancer Father 22       alive now age 77  . Asthma Father   . Heart disease Father        Pt is not sure of details (no MI)  . Alcoholism Father   . Obesity Father   . Lung cancer Maternal Grandmother        non-smoker  . Tuberculosis Brother    ROS: Review of Systems   Respiratory:       Positive for OSA.  Gastrointestinal: Negative for nausea.  Endo/Heme/Allergies:       Negative for hypoglycemia.   PHYSICAL EXAM: Blood pressure 127/79, pulse (!) 52, temperature 98.5 F (36.9 C), height 5\' 11"  (1.803 m), weight 280 lb (127 kg), SpO2 95 %. Body mass index is 39.05 kg/m. Physical Exam Vitals signs reviewed.  Constitutional:      Appearance: Normal appearance. He is obese.  Cardiovascular:     Rate and Rhythm: Normal rate.     Pulses: Normal pulses.  Pulmonary:     Effort:  Pulmonary effort is normal.     Breath sounds: Normal breath sounds.  Musculoskeletal: Normal range of motion.  Skin:    General: Skin is warm and dry.  Neurological:     Mental Status: He is alert and oriented to person, place, and time.  Psychiatric:        Behavior: Behavior normal.   RECENT LABS AND TESTS: BMET    Component Value Date/Time   NA 137 01/04/2019 1504   K 3.9 01/04/2019 1504   CL 93 (L) 01/04/2019 1504   CO2 31 (H) 01/04/2019 1504   GLUCOSE 91 01/04/2019 1504   GLUCOSE 110 (H) 04/06/2018 0848   BUN 12 01/04/2019 1504   CREATININE 0.99 01/04/2019 1504   CALCIUM 9.8 01/04/2019 1504   GFRNONAA 81 01/04/2019 1504   GFRAA 94 01/04/2019 1504   Lab Results  Component Value Date   HGBA1C 6.1 (H) 01/04/2019   HGBA1C 6.5 (H) 08/28/2018   HGBA1C 6.5 04/06/2018   HGBA1C 6.0 04/02/2016   HGBA1C 6.1 04/09/2014   Lab Results  Component Value Date   INSULIN 46.3 (H) 08/28/2018   CBC    Component Value Date/Time   WBC 9.7 04/06/2018 0848   RBC 5.02 04/06/2018 0848   HGB 13.4 04/06/2018 0848   HCT 41.3 04/06/2018 0848   PLT 302.0 04/06/2018 0848   MCV 82.2 04/06/2018 0848   MCH 25.5 (L) 04/20/2011 0953   MCHC 32.5 04/06/2018 0848   RDW 17.4 (H) 04/06/2018 0848   LYMPHSABS 2.0 04/06/2018 0848   MONOABS 1.0 04/06/2018 0848   EOSABS 0.2 04/06/2018 0848   BASOSABS 0.0 04/06/2018 0848   Iron/TIBC/Ferritin/ %Sat    Component Value Date/Time    IRON 150 12/15/2011 1507   FERRITIN 9.2 (L) 12/15/2011 1507   IRONPCTSAT 35.3 12/15/2011 1507   Lipid Panel     Component Value Date/Time   CHOL 175 04/06/2018 0848   TRIG 149.0 04/06/2018 0848   HDL 43.50 04/06/2018 0848   CHOLHDL 4 04/06/2018 0848   VLDL 29.8 04/06/2018 0848   LDLCALC 102 (H) 04/06/2018 0848   Hepatic Function Panel     Component Value Date/Time   PROT 8.0 01/04/2019 1504   ALBUMIN 4.0 01/04/2019 1504   AST 19 01/04/2019 1504   ALT 20 01/04/2019 1504   ALKPHOS 68 01/04/2019 1504   BILITOT 0.7 01/04/2019 1504   BILIDIR 0.1 04/02/2016 0720   IBILI 0.3 04/17/2010 2157      Component Value Date/Time   TSH 3.05 04/06/2018 0848   TSH 1.63 02/16/2018 1655   TSH 3.70 04/02/2016 0720   Results for JERMON, HUMPERT (MRN [id number]) as of 02/27/2019 14:52  Ref. Range 01/04/2019 15:04  Vitamin D, 25-Hydroxy Latest Ref Range: 30.0 - 100.0 ng/mL 47.3   OBESITY BEHAVIORAL INTERVENTION VISIT  Today's visit was #11  Starting weight: 319 lbs Starting date: 08/28/2018 Today's weight: 280 lbs   Today's date: 02/27/2019 Total lbs lost to date: 39  At least 15 minutes were spent on discussing the following behavioral intervention visit.    02/27/2019  Height 5\' 11"  (1.803 m)  Weight 280 lb (127 kg)  BMI (Calculated) 39.07  BLOOD PRESSURE - SYSTOLIC [id number]  BLOOD PRESSURE - DIASTOLIC 79   Body Fat % 0000000 %  Total Body Water (lbs) 118 lbs   ASK: We discussed the diagnosis of obesity with Jonathan [name] today and Jonathan [name] agreed to give [name] permission to discuss obesity behavioral modification therapy today.  ASSESS: Cleaven has  the diagnosis of obesity and his BMI today is 39.2. Koltan is in the action stage of change.  ADVISE: Veto was educated on the multiple health risks of obesity as well as the benefit of weight loss to improve his health. He was advised of the need for long term treatment and the importance of lifestyle modifications to improve his current  health and to decrease his risk of future health problems.  AGREE: Multiple dietary modification options and treatment options were discussed and  Jonathan [name] agreed to follow the recommendations documented in the above note.  ARRANGE: Hiyab was educated on the importance of frequent visits to treat obesity as outlined per CMS and USPSTF guidelines and agreed to schedule his next follow up appointment today.  Jonathan [name], am acting as Location manager for [employer], DO   I have reviewed the above documentation for accuracy and completeness, and I agree with the above. -[name], DO

## 2019-02-27 NOTE — Telephone Encounter (Signed)
Pt returned call back. Pt states the pressure change helped tremendously and is no longer having issues. Pt denies any other concerns or issues at this time. Nothing further needed at this time.   Will forward to Dr. Ander Slade as an Juluis Rainier. Thank you.

## 2019-02-27 NOTE — Telephone Encounter (Signed)
lmtcb for pt.  Order placed on 11/10 for pressure changed.

## 2019-02-28 ENCOUNTER — Encounter (INDEPENDENT_AMBULATORY_CARE_PROVIDER_SITE_OTHER): Payer: Self-pay | Admitting: Bariatrics

## 2019-03-20 ENCOUNTER — Other Ambulatory Visit: Payer: Self-pay

## 2019-03-20 ENCOUNTER — Ambulatory Visit (INDEPENDENT_AMBULATORY_CARE_PROVIDER_SITE_OTHER): Payer: Medicare Other | Admitting: Bariatrics

## 2019-03-20 ENCOUNTER — Encounter (INDEPENDENT_AMBULATORY_CARE_PROVIDER_SITE_OTHER): Payer: Self-pay | Admitting: Bariatrics

## 2019-03-20 VITALS — BP 146/83 | HR 54 | Temp 97.8°F | Ht 71.0 in | Wt 280.0 lb

## 2019-03-20 DIAGNOSIS — I10 Essential (primary) hypertension: Secondary | ICD-10-CM

## 2019-03-20 DIAGNOSIS — Z6839 Body mass index (BMI) 39.0-39.9, adult: Secondary | ICD-10-CM | POA: Diagnosis not present

## 2019-03-20 DIAGNOSIS — E7849 Other hyperlipidemia: Secondary | ICD-10-CM | POA: Diagnosis not present

## 2019-03-20 NOTE — Progress Notes (Signed)
Office: [phone]  /  Fax: [phone]   HPI:   Chief Complaint: OBESITY Jonathan [name] is here to discuss his progress with his obesity treatment plan. He is on the Category 3 plan and is following his eating plan approximately 80% of the time. He states he is doing weights 30-45 minutes 3 times per week and cardio 20 minutes 3 times per week. Jonathan [name] has maintained his weight and is gaining muscle. His weight is 280 lb (127 kg) today and has not lost weight since his last visit. He has lost 39 lbs since starting treatment with [name].  Hypertension Jonathan [name] is a 63 y.o. male with hypertension and is taking Norvasc and Tenoretic.  Jonathan [name] denies chest pain or shortness of breath on exertion. He is working weight loss to help control his blood pressure with the goal of decreasing his risk of heart attack and stroke. Jonathan [name]'s blood pressure is controlled.  Hyperlipidemia due to dietary fat Jonathan [name] has hyperlipidemia and is on no medications. He has been trying to improve his cholesterol levels with intensive lifestyle modification including a low saturated fat diet, exercise and weight loss. He denies any chest pain, claudication or myalgias.  ASSESSMENT AND PLAN:  Essential hypertension  Other hyperlipidemia  Class 2 severe obesity with serious comorbidity and body mass index (BMI) of 39.0 to 39.9 in adult, unspecified obesity type ([place])  PLAN:  Hypertension Jonathan [name] is working on healthy weight loss and exercise to improve blood pressure control. He will continue his medications and will watch for signs of hypotension as he continues his lifestyle modifications.  Hyperlipidemia due to dietary fat Intensive lifestyle modifications as the first line treatment for hyperlipidemia. We discussed many lifestyle modifications today and Jonathan [name] will continue to work on diet, exercise and weight loss efforts. He will decrease trans and saturated fats and will increase PUFA's and MUFA's.   Obesity Jonathan [name] is currently in the action stage of change. As such, his goal is to continue with weight loss efforts. He has agreed to follow the Category 3 plan. Jonathan [name] will work on meal planning, increasing protein, and increasing his water intake. Jonathan [name] has been instructed to incorporate more cardio to increase muscle for weight loss and overall health benefits. We discussed the following Behavioral Modification Strategies today: increasing lean protein intake, decreasing simple carbohydrates, increasing vegetables, increase H20 intake, decrease eating out, no skipping meals, work on meal planning and easy cooking plans, keeping healthy foods in the home, and planning for success.  Jonathan [name] has agreed to follow-up with our clinic in 2-4 weeks. He was informed of the importance of frequent follow-up visits to maximize his success with intensive lifestyle modifications for his multiple health conditions.  ALLERGIES: Allergies  Allergen Reactions  . Codeine     REACTION: emesis  . Cozaar [Losartan Potassium] Hives    MEDICATIONS: Current Outpatient Medications on File Prior to Visit  Medication Sig Dispense Refill  . amLODipine (NORVASC) 5 MG tablet TAKE 1 TABLET BY MOUTH EVERY DAY 90 tablet 1  . Ascorbic Acid (VITAMIN C) 1000 MG tablet Take 1,000 mg by mouth daily.    [name] atenolol-chlorthalidone (TENORETIC) 50-25 MG tablet TAKE 1 TABLET BY MOUTH EVERY DAY 90 tablet 2  . Cholecalciferol (VITAMIN D3) 250 MCG (10000 UT) TABS Take by mouth.    . Echinacea 500 MG CAPS Take by mouth.    [name] ibuprofen (ADVIL) 200 MG tablet Take 200 mg by mouth every 6 (six) hours as needed.    [name]  mupirocin ointment (BACTROBAN) 2 % Apply 1 application topically 3 (three) times daily. 15 g 0  . potassium chloride (KLOR-CON) 10 MEQ tablet Take 1 tablet (10 mEq total) by mouth daily. 30 tablet 0  . Vitamin D, Ergocalciferol, (DRISDOL) 1.25 MG (50000 UT) CAPS capsule Take 1 capsule (50,000 Units total) by mouth every  7 (seven) days. 4 capsule 0  . zinc gluconate 50 MG tablet Take 50 mg by mouth daily.     No current facility-administered medications on file prior to visit.     PAST MEDICAL HISTORY: Past Medical History:  Diagnosis Date  . Anxiety   . Arthritis   . Asthma   . BPH (benign prostatic hyperplasia)   . Chest pain   . ED (erectile dysfunction)   . Elevated PSA    9.33  . Esophageal reflux   . Hearing loss   . History of hip replacement   . History of knee replacement   . Hypertension   . Joint pain   . Lactose intolerance   . Numbness in both legs   . Pre-diabetes   . Prostate cancer [facility]) Jan 2016   prostatectomy  . Sleep apnea   . Vitamin D deficiency     PAST SURGICAL HISTORY: Past Surgical History:  Procedure Laterality Date  . CORONARY CALCIUM SCORE / CARDIAC CT ANGIOGRAM  10/24/2017   Cor Ca Score = 0 - Low risk. Cor CTA: only mild plaque noted in mRCA with mLAD intramyocardial bridging noted.  [name] FOOT SURGERY     bilateral foot  . NM MYOVIEW LTD  07/2007   Normal nuclear stress test.  Mild fixed defect in the anterior wall likely related to soft tissue attenuation.  Normal EF.  Fair exercise capacity.  [name] PROSTATE BIOPSY  06/13/2012   right lat mid gleason 3+3=6  . PROSTATECTOMY  2016  . TOTAL HIP ARTHROPLASTY  2008 &2007   Right and left    SOCIAL HISTORY: Social History   Tobacco Use  . Smoking status: Never Smoker  . Smokeless tobacco: Never Used  Substance Use Topics  . Alcohol use: No  . Drug use: No    FAMILY HISTORY: Family History  Problem Relation Age of Onset  . Hypertension Mother   . Colon cancer Mother   . Obesity Mother   . Prostate cancer Father 62       alive now age 26  . Asthma Father   . Heart disease Father        Pt is not sure of details (no MI)  . Alcoholism Father   . Obesity Father   . Lung cancer Maternal Grandmother        non-smoker  . Tuberculosis Brother    ROS: Review of Systems  Respiratory: Negative for  shortness of breath.   Cardiovascular: Negative for chest pain and claudication.  Musculoskeletal: Negative for myalgias.   PHYSICAL EXAM: Blood pressure (!) 146/83, pulse (!) 54, temperature 97.8 F (36.6 C), height 5\' 11"  (1.803 m), weight 280 lb (127 kg), SpO2 96 %. Body mass index is 39.05 kg/m. Physical Exam Vitals signs reviewed.  Constitutional:      Appearance: Normal appearance. He is obese.  Cardiovascular:     Rate and Rhythm: Normal rate.     Pulses: Normal pulses.  Pulmonary:     Effort: Pulmonary effort is normal.     Breath sounds: Normal breath sounds.  Musculoskeletal: Normal range of motion.  Skin:    General:  Skin is warm and dry.  Neurological:     Mental Status: He is alert and oriented to person, place, and time.  Psychiatric:        Behavior: Behavior normal.   RECENT LABS AND TESTS: BMET    Component Value Date/Time   NA 137 01/04/2019 1504   K 3.9 01/04/2019 1504   CL 93 (L) 01/04/2019 1504   CO2 31 (H) 01/04/2019 1504   GLUCOSE 91 01/04/2019 1504   GLUCOSE 110 (H) 04/06/2018 0848   BUN 12 01/04/2019 1504   CREATININE 0.99 01/04/2019 1504   CALCIUM 9.8 01/04/2019 1504   GFRNONAA 81 01/04/2019 1504   GFRAA 94 01/04/2019 1504   Lab Results  Component Value Date   HGBA1C 6.1 (H) 01/04/2019   HGBA1C 6.5 (H) 08/28/2018   HGBA1C 6.5 04/06/2018   HGBA1C 6.0 04/02/2016   HGBA1C 6.1 04/09/2014   Lab Results  Component Value Date   INSULIN 46.3 (H) 08/28/2018   CBC    Component Value Date/Time   WBC 9.7 04/06/2018 0848   RBC 5.02 04/06/2018 0848   HGB 13.4 04/06/2018 0848   HCT 41.3 04/06/2018 0848   PLT 302.0 04/06/2018 0848   MCV 82.2 04/06/2018 0848   MCH 25.5 (L) 04/20/2011 0953   MCHC 32.5 04/06/2018 0848   RDW 17.4 (H) 04/06/2018 0848   LYMPHSABS 2.0 04/06/2018 0848   MONOABS 1.0 04/06/2018 0848   EOSABS 0.2 04/06/2018 0848   BASOSABS 0.0 04/06/2018 0848   Iron/TIBC/Ferritin/ %Sat    Component Value Date/Time   IRON 150  12/15/2011 1507   FERRITIN 9.2 (L) 12/15/2011 1507   IRONPCTSAT 35.3 12/15/2011 1507   Lipid Panel     Component Value Date/Time   CHOL 175 04/06/2018 0848   TRIG 149.0 04/06/2018 0848   HDL 43.50 04/06/2018 0848   CHOLHDL 4 04/06/2018 0848   VLDL 29.8 04/06/2018 0848   LDLCALC 102 (H) 04/06/2018 0848   Hepatic Function Panel     Component Value Date/Time   PROT 8.0 01/04/2019 1504   ALBUMIN 4.0 01/04/2019 1504   AST 19 01/04/2019 1504   ALT 20 01/04/2019 1504   ALKPHOS 68 01/04/2019 1504   BILITOT 0.7 01/04/2019 1504   BILIDIR 0.1 04/02/2016 0720   IBILI 0.3 04/17/2010 2157      Component Value Date/Time   TSH 3.05 04/06/2018 0848   TSH 1.63 02/16/2018 1655   TSH 3.70 04/02/2016 0720   Results for GIANN, SCHAAD (MRN [id number]) as of 03/20/2019 12:17  Ref. Range 01/04/2019 15:04  Vitamin D, 25-Hydroxy Latest Ref Range: 30.0 - 100.0 ng/mL 47.3   OBESITY BEHAVIORAL INTERVENTION VISIT  Today's visit was #12  Starting weight: 319 lbs Starting date: 08/28/2018 Today's weight: 280 lbs   Today's date: 03/20/2019 Total lbs lost to date: 39  At least 15 minutes were spent on discussing the following behavioral intervention visit.    03/20/2019  Height 5\' 11"  (1.803 m)  Weight 280 lb (127 kg)  BMI (Calculated) 39.07  BLOOD PRESSURE - SYSTOLIC [id number]  BLOOD PRESSURE - DIASTOLIC 83   Body Fat % 0000000 %  Total Body Water (lbs) 116.2 lbs   ASK: We discussed the diagnosis of obesity with Jonathan [name] today and [name] agreed to give [name] permission to discuss obesity behavioral modification therapy today.  ASSESS: Aldous has the diagnosis of obesity and his BMI today is 39.1. Elvis is in the action stage of change.   ADVISE: Joevanny was educated on  the multiple health risks of obesity as well as the benefit of weight loss to improve his health. He was advised of the need for long term treatment and the importance of lifestyle modifications to improve his current health  and to decrease his risk of future health problems.  AGREE: Multiple dietary modification options and treatment options were discussed and  Ayodele agreed to follow the recommendations documented in the above note.  ARRANGE: Andreu was educated on the importance of frequent visits to treat obesity as outlined per CMS and USPSTF guidelines and agreed to schedule his next follow up appointment today.  IMichaelene Song, am acting as Location manager for [employer], DO

## 2019-04-02 ENCOUNTER — Ambulatory Visit (INDEPENDENT_AMBULATORY_CARE_PROVIDER_SITE_OTHER): Payer: Medicare Other | Admitting: Bariatrics

## 2019-04-02 ENCOUNTER — Encounter (INDEPENDENT_AMBULATORY_CARE_PROVIDER_SITE_OTHER): Payer: Self-pay | Admitting: Bariatrics

## 2019-04-02 ENCOUNTER — Other Ambulatory Visit: Payer: Self-pay

## 2019-04-02 VITALS — BP 130/79 | HR 53 | Temp 98.3°F | Ht 71.0 in | Wt 279.0 lb

## 2019-04-02 DIAGNOSIS — Z6839 Body mass index (BMI) 39.0-39.9, adult: Secondary | ICD-10-CM

## 2019-04-02 DIAGNOSIS — R7303 Prediabetes: Secondary | ICD-10-CM

## 2019-04-02 DIAGNOSIS — E876 Hypokalemia: Secondary | ICD-10-CM

## 2019-04-02 MED ORDER — POTASSIUM CHLORIDE ER 10 MEQ PO TBCR: 10.0000 meq | EXTENDED_RELEASE_TABLET | Freq: Every day | ORAL | 0 refills | Status: DC

## 2019-04-03 ENCOUNTER — Encounter (INDEPENDENT_AMBULATORY_CARE_PROVIDER_SITE_OTHER): Payer: Self-pay | Admitting: Bariatrics

## 2019-04-03 NOTE — Progress Notes (Signed)
Office: [phone]  /  Fax: [phone]   HPI:  Chief Complaint: OBESITY Jonathan [name] is here to discuss his progress with his obesity treatment plan. He is on the Category 3 plan and states he is following his eating plan approximately 75% of the time. He states he is exercising with weights 45-60 minutes 1-2 times per week.  Jonathan [name] is down 1 lb and doing well overall. He has not been exercising as much due to a rash. He states he is doing well with his water and protein intake.  Today's visit was #13 Starting weight: 319 lbs Starting date: 08/28/2018 Today's weight: 279 lbs  Today's date: 04/02/2019 Total lbs lost to date: 40  Total lbs lost since last in-office visit: 1  [name] has a diagnosis of hypokalemia, which is controlled. He is taking Klor-Con daily.  Prediabetes Jonathan [name] has a diagnosis of prediabetes. Last A1c 6.1 on 01/04/2019 with an insulin of 46.3 on 08/28/2018. No polyphagia.  ASSESSMENT AND PLAN:  Hypokalemia - Plan: potassium chloride (KLOR-CON) 10 MEQ tablet  Prediabetes  Class 2 severe obesity with serious comorbidity and body mass index (BMI) of 39.0 to 39.9 in adult, unspecified obesity type ([place])  PLAN:  [name] was given a prescription for potassium (Klor-Con) 10 mEq by mouth daily #30 with 0 refills. He agrees to follow-up with our clinic in 2-3 weeks.  Pre-Diabetes Jonathan [name] will continue to work on weight loss, exercise, decreasing carbohydrates, and increasing healthy fats and protein to help decrease the risk of diabetes.   Obesity Jonathan [name] is currently in the action stage of change. As such, his goal is to continue with weight loss efforts. He has agreed to follow the Category 3 plan. Jonathan [name] will work on meal planning and intentional eating. Jonathan [name] has been instructed to work up to a goal of 150 minutes of combined cardio and strengthening exercise per week for weight loss and overall health benefits. We discussed the  following Behavioral Modification Strategies today: increasing lean protein intake, decreasing simple carbohydrates, increasing vegetables, increase H20 intake, decrease eating out, no skipping meals, work on meal planning and easy cooking plans, keeping healthy foods in the home, and planning for success.  Jonathan [name] has agreed to follow-up with our clinic in 2-3 weeks. He was informed of the importance of frequent follow-up visits to maximize his success with intensive lifestyle modifications for his multiple health conditions.  ALLERGIES: Allergies  Allergen Reactions  . Codeine     REACTION: emesis  . Cozaar [Losartan Potassium] Hives    MEDICATIONS: Current Outpatient Medications on File Prior to Visit  Medication Sig Dispense Refill  . amLODipine (NORVASC) 5 MG tablet TAKE 1 TABLET BY MOUTH EVERY DAY 90 tablet 1  . Ascorbic Acid (VITAMIN C) 1000 MG tablet Take 1,000 mg by mouth daily.    [name] atenolol-chlorthalidone (TENORETIC) 50-25 MG tablet TAKE 1 TABLET BY MOUTH EVERY DAY 90 tablet 2  . Cholecalciferol (VITAMIN D3) 250 MCG (10000 UT) TABS Take by mouth.    . Echinacea 500 MG CAPS Take by mouth.    [name] ibuprofen (ADVIL) 200 MG tablet Take 200 mg by mouth every 6 (six) hours as needed.    . mupirocin ointment (BACTROBAN) 2 % Apply 1 application topically 3 (three) times daily. 15 g 0  . Vitamin D, Ergocalciferol, (DRISDOL) 1.25 MG (50000 UT) CAPS capsule Take 1 capsule (50,000 Units total) by mouth every 7 (seven) days. 4 capsule 0  . zinc gluconate 50 MG tablet Take 50  mg by mouth daily.     No current facility-administered medications on file prior to visit.    PAST MEDICAL HISTORY: Past Medical History:  Diagnosis Date  . Anxiety   . Arthritis   . Asthma   . BPH (benign prostatic hyperplasia)   . Chest pain   . ED (erectile dysfunction)   . Elevated PSA    9.33  . Esophageal reflux   . Hearing loss   . History of hip replacement   . History of knee replacement   .  Hypertension   . Joint pain   . Lactose intolerance   . Numbness in both legs   . Pre-diabetes   . Prostate cancer [facility]) Jan 2016   prostatectomy  . Sleep apnea   . Vitamin D deficiency     PAST SURGICAL HISTORY: Past Surgical History:  Procedure Laterality Date  . CORONARY CALCIUM SCORE / CARDIAC CT ANGIOGRAM  10/24/2017   Cor Ca Score = 0 - Low risk. Cor CTA: only mild plaque noted in mRCA with mLAD intramyocardial bridging noted.  [name] FOOT SURGERY     bilateral foot  . NM MYOVIEW LTD  07/2007   Normal nuclear stress test.  Mild fixed defect in the anterior wall likely related to soft tissue attenuation.  Normal EF.  Fair exercise capacity.  [name] PROSTATE BIOPSY  06/13/2012   right lat mid gleason 3+3=6  . PROSTATECTOMY  2016  . TOTAL HIP ARTHROPLASTY  2008 &2007   Right and left    SOCIAL HISTORY: Social History   Tobacco Use  . Smoking status: Never Smoker  . Smokeless tobacco: Never Used  Substance Use Topics  . Alcohol use: No  . Drug use: No    FAMILY HISTORY: Family History  Problem Relation Age of Onset  . Hypertension Mother   . Colon cancer Mother   . Obesity Mother   . Prostate cancer Father 22       alive now age 37  . Asthma Father   . Heart disease Father        Pt is not sure of details (no MI)  . Alcoholism Father   . Obesity Father   . Lung cancer Maternal Grandmother        non-smoker  . Tuberculosis Brother    ROS: Review of Systems  Constitutional: Positive for weight loss.  Endo/Heme/Allergies:       Negative for polyphagia.   PHYSICAL EXAM: Blood pressure 130/79, pulse (!) 53, temperature 98.3 F (36.8 C), height 5\' 11"  (1.803 m), weight 279 lb (126.6 kg), SpO2 96 %. Body mass index is 38.91 kg/m. Physical Exam Vitals reviewed.  Constitutional:      Appearance: Normal appearance. He is obese.  Cardiovascular:     Rate and Rhythm: Normal rate.     Pulses: Normal pulses.  Pulmonary:     Effort: Pulmonary effort is normal.      Breath sounds: Normal breath sounds.  Musculoskeletal:        General: Normal range of motion.  Skin:    General: Skin is warm and dry.  Neurological:     Mental Status: He is alert and oriented to person, place, and time.  Psychiatric:        Behavior: Behavior normal.   RECENT LABS AND TESTS: BMET    Component Value Date/Time   NA 137 01/04/2019 1504   K 3.9 01/04/2019 1504   CL 93 (L) 01/04/2019 1504   CO2 31 (  H) 01/04/2019 1504   GLUCOSE 91 01/04/2019 1504   GLUCOSE 110 (H) 04/06/2018 0848   BUN 12 01/04/2019 1504   CREATININE 0.99 01/04/2019 1504   CALCIUM 9.8 01/04/2019 1504   GFRNONAA 81 01/04/2019 1504   GFRAA 94 01/04/2019 1504   Lab Results  Component Value Date   HGBA1C 6.1 (H) 01/04/2019   HGBA1C 6.5 (H) 08/28/2018   HGBA1C 6.5 04/06/2018   HGBA1C 6.0 04/02/2016   HGBA1C 6.1 04/09/2014   Lab Results  Component Value Date   INSULIN 46.3 (H) 08/28/2018   CBC    Component Value Date/Time   WBC 9.7 04/06/2018 0848   RBC 5.02 04/06/2018 0848   HGB 13.4 04/06/2018 0848   HCT 41.3 04/06/2018 0848   PLT 302.0 04/06/2018 0848   MCV 82.2 04/06/2018 0848   MCH 25.5 (L) 04/20/2011 0953   MCHC 32.5 04/06/2018 0848   RDW 17.4 (H) 04/06/2018 0848   LYMPHSABS 2.0 04/06/2018 0848   MONOABS 1.0 04/06/2018 0848   EOSABS 0.2 04/06/2018 0848   BASOSABS 0.0 04/06/2018 0848   Iron/TIBC/Ferritin/ %Sat    Component Value Date/Time   IRON 150 12/15/2011 1507   FERRITIN 9.2 (L) 12/15/2011 1507   IRONPCTSAT 35.3 12/15/2011 1507   Lipid Panel     Component Value Date/Time   CHOL 175 04/06/2018 0848   TRIG 149.0 04/06/2018 0848   HDL 43.50 04/06/2018 0848   CHOLHDL 4 04/06/2018 0848   VLDL 29.8 04/06/2018 0848   LDLCALC 102 (H) 04/06/2018 0848   Hepatic Function Panel     Component Value Date/Time   PROT 8.0 01/04/2019 1504   ALBUMIN 4.0 01/04/2019 1504   AST 19 01/04/2019 1504   ALT 20 01/04/2019 1504   ALKPHOS 68 01/04/2019 1504   BILITOT 0.7 01/04/2019  1504   BILIDIR 0.1 04/02/2016 0720   IBILI 0.3 04/17/2010 2157      Component Value Date/Time   TSH 3.05 04/06/2018 0848   TSH 1.63 02/16/2018 1655   TSH 3.70 04/02/2016 0720    OBESITY BEHAVIORAL INTERVENTION VISIT DOCUMENTATION FOR INSURANCE (~15 minutes)  ASK: We discussed the diagnosis of obesity with Rolene Course today and [name] agreed to give [name] permission to discuss obesity behavioral modification therapy today.  ASSESS: Normal has the diagnosis of obesity and his BMI today is 39.0. Xzavien is in the action stage of change.   ADVISE: Proctor was educated on the multiple health risks of obesity as well as the benefit of weight loss to improve his health. He was advised of the need for long term treatment and the importance of lifestyle modifications to improve his current health and to decrease his risk of future health problems.  AGREE: Multiple dietary modification options and treatment options were discussed and  Brayden agreed to follow the recommendations documented in the above note.  ARRANGE: Royel was educated on the importance of frequent visits to treat obesity as outlined per CMS and USPSTF guidelines and agreed to schedule his next follow up appointment today.  [name], am acting as Location manager for [employer], DO  I have reviewed the above documentation for accuracy and completeness, and I agree with the above. -[name], DO

## 2019-04-25 ENCOUNTER — Encounter (INDEPENDENT_AMBULATORY_CARE_PROVIDER_SITE_OTHER): Payer: Self-pay | Admitting: Bariatrics

## 2019-04-25 ENCOUNTER — Ambulatory Visit (INDEPENDENT_AMBULATORY_CARE_PROVIDER_SITE_OTHER): Payer: Medicare Other | Admitting: Bariatrics

## 2019-04-25 ENCOUNTER — Other Ambulatory Visit: Payer: Self-pay

## 2019-04-25 VITALS — BP 128/68 | HR 51 | Temp 98.0°F | Ht 71.0 in | Wt 281.0 lb

## 2019-04-25 DIAGNOSIS — I10 Essential (primary) hypertension: Secondary | ICD-10-CM | POA: Diagnosis not present

## 2019-04-25 DIAGNOSIS — E7849 Other hyperlipidemia: Secondary | ICD-10-CM | POA: Diagnosis not present

## 2019-04-25 DIAGNOSIS — Z6839 Body mass index (BMI) 39.0-39.9, adult: Secondary | ICD-10-CM | POA: Diagnosis not present

## 2019-04-26 NOTE — Progress Notes (Signed)
Chief Complaint:   OBESITY Jonathan [name] is here to discuss his progress with his obesity treatment plan along with follow-up of his obesity related diagnoses. Sullivan is on the Category 3 Plan and states he is following his eating plan approximately 80% of the time. Treyvon states he is working with weights 45 minutes 3 times per week and doing cardio 20 minutes 3 times per week.  Today's visit was #: 14 Starting weight: 319 lbs Starting date: 08/28/2018 Today's weight: 281 lbs Today's date: 04/25/2019 Total lbs lost to date: 38  Total lbs lost since last in-office visit: 0  Interim History: Emilson is up 2 lbs, but has been doing "a lot of weights but will get back to cardio." He reports doing well with his water and protein intake.  Subjective:   Essential hypertension. This is well controlled at 128/68. He is taking Tenoretic.  BP Readings from Last 3 Encounters:  04/25/19 128/68  04/02/19 130/79  03/20/19 (!) 146/83   Other hyperlipidemia. This is due to food intake. He is on no medications and wants to control with diet. Kaceton has hyperlipidemia and has been trying to improve his cholesterol levels with intensive lifestyle modification including a low saturated fat diet, exercise and weight loss. He denies any chest pain, claudication or myalgias.  Lab Results  Component Value Date   ALT 20 01/04/2019   AST 19 01/04/2019   ALKPHOS 68 01/04/2019   BILITOT 0.7 01/04/2019   Lab Results  Component Value Date   CHOL 175 04/06/2018   HDL 43.50 04/06/2018   LDLCALC 102 (H) 04/06/2018   TRIG 149.0 04/06/2018   CHOLHDL 4 04/06/2018   Assessment/Plan:   Essential hypertension. Anari is working on healthy weight loss and exercise to improve blood pressure control. We will watch for signs of hypotension as he continues his lifestyle modifications. He will decrease saturated fats and increase PUFA's and MUFA's.  Other hyperlipidemia. Cardiovascular risk and specific  lipid/LDL goals reviewed.  We discussed several lifestyle modifications today and Arkie will continue to work on diet, exercise and weight loss efforts. Orders and follow up as documented in patient record. He will continue his medications. He was instructed to not use salt in his diet.  Counseling Intensive lifestyle modifications are the first line treatment for this issue. . Dietary changes: Increase soluble fiber. Decrease simple carbohydrates. . Exercise changes: Moderate to vigorous-intensity aerobic activity 150 minutes per week if tolerated. . Lipid-lowering medications: see documented in medical record.  Class 2 severe obesity with serious comorbidity and body mass index (BMI) of 39.0 to 39.9 in adult, unspecified obesity type ([place]).  Jaxs is currently in the action stage of change. As such, his goal is to continue with weight loss efforts. He has agreed to the Category 3 Plan.   He will work on meal planning and intentional eating.  We discussed the following exercise goals today: Joel will increase cardio in workout (increase walking and other cardio).  We discussed the following behavioral modification strategies today: increasing lean protein intake, decreasing simple carbohydrates, increasing vegetables, increasing water intake, decreasing eating out, no skipping meals, meal planning and cooking strategies and keeping healthy foods in the home.  CLEBERT PEREDA has agreed to follow-up with our clinic in 2 weeks. He was informed of the importance of frequent follow-up visits to maximize his success with intensive lifestyle modifications for his multiple health conditions.   Objective:   Blood pressure 128/68, pulse (!) 51,  temperature 98 F (36.7 C), height 5\' 11"  (1.803 m), weight 281 lb (127.5 kg), SpO2 96 %. Body mass index is 39.19 kg/m.  General: Cooperative, alert, well developed, in no acute distress. HEENT: Conjunctivae and lids unremarkable. Neck: No  thyromegaly.  Cardiovascular: Regular rhythm.  Lungs: Normal work of breathing. Extremities: No edema.  Neurologic: No focal deficits.   Lab Results  Component Value Date   CREATININE 0.99 01/04/2019   BUN 12 01/04/2019   NA 137 01/04/2019   K 3.9 01/04/2019   CL 93 (L) 01/04/2019   CO2 31 (H) 01/04/2019   Lab Results  Component Value Date   ALT 20 01/04/2019   AST 19 01/04/2019   ALKPHOS 68 01/04/2019   BILITOT 0.7 01/04/2019   Lab Results  Component Value Date   HGBA1C 6.1 (H) 01/04/2019   HGBA1C 6.5 (H) 08/28/2018   HGBA1C 6.5 04/06/2018   HGBA1C 6.0 04/02/2016   HGBA1C 6.1 04/09/2014   Lab Results  Component Value Date   INSULIN 46.3 (H) 08/28/2018   Lab Results  Component Value Date   TSH 3.05 04/06/2018   Lab Results  Component Value Date   CHOL 175 04/06/2018   HDL 43.50 04/06/2018   LDLCALC 102 (H) 04/06/2018   TRIG 149.0 04/06/2018   CHOLHDL 4 04/06/2018   Lab Results  Component Value Date   WBC 9.7 04/06/2018   HGB 13.4 04/06/2018   HCT 41.3 04/06/2018   MCV 82.2 04/06/2018   PLT 302.0 04/06/2018   Lab Results  Component Value Date   IRON 150 12/15/2011   FERRITIN 9.2 (L) 12/15/2011    Obesity Behavioral Intervention Documentation for Insurance:   Approximately 15 minutes were spent on the discussion below.  ASK: We discussed the diagnosis of obesity with [name] today and Delvante agreed to give [name] permission to discuss obesity behavioral modification therapy today.  ASSESS: Quamane has the diagnosis of obesity and his BMI today is 39.3. Flemon is in the action stage of change.   ADVISE: Harjot was educated on the multiple health risks of obesity as well as the benefit of weight loss to improve his health. He was advised of the need for long term treatment and the importance of lifestyle modifications to improve his current health and to decrease his risk of future health problems.  AGREE: Multiple dietary modification options and  treatment options were discussed and Zyheim agreed to follow the recommendations documented in the above note.  ARRANGE: Paton was educated on the importance of frequent visits to treat obesity as outlined per CMS and USPSTF guidelines and agreed to schedule his next follow up appointment today.  Attestation Statements:   Reviewed by clinician on day of visit: allergies, medications, problem list, medical history, surgical history, family history, social history, and previous encounter notes.  Time spent on visit including pre-visit chart review and post-visit care was 22 minutes.   [name], am acting as Location manager for [employer], DO   I have reviewed the above documentation for accuracy and completeness, and I agree with the above. [name], DO

## 2019-04-30 ENCOUNTER — Encounter (INDEPENDENT_AMBULATORY_CARE_PROVIDER_SITE_OTHER): Payer: Self-pay | Admitting: Bariatrics

## 2019-05-01 ENCOUNTER — Other Ambulatory Visit: Payer: Self-pay | Admitting: Adult Health

## 2019-05-09 ENCOUNTER — Other Ambulatory Visit: Payer: Self-pay

## 2019-05-09 ENCOUNTER — Encounter: Payer: Self-pay | Admitting: Adult Health

## 2019-05-09 ENCOUNTER — Ambulatory Visit (INDEPENDENT_AMBULATORY_CARE_PROVIDER_SITE_OTHER): Payer: Medicare Other | Admitting: Adult Health

## 2019-05-09 VITALS — BP 130/72 | HR 50 | Temp 96.9°F | Ht 70.0 in | Wt 286.0 lb

## 2019-05-09 DIAGNOSIS — E7849 Other hyperlipidemia: Secondary | ICD-10-CM | POA: Diagnosis not present

## 2019-05-09 DIAGNOSIS — Z0000 Encounter for general adult medical examination without abnormal findings: Secondary | ICD-10-CM

## 2019-05-09 DIAGNOSIS — R7303 Prediabetes: Secondary | ICD-10-CM

## 2019-05-09 DIAGNOSIS — Z6839 Body mass index (BMI) 39.0-39.9, adult: Secondary | ICD-10-CM

## 2019-05-09 DIAGNOSIS — Z9989 Dependence on other enabling machines and devices: Secondary | ICD-10-CM

## 2019-05-09 DIAGNOSIS — E559 Vitamin D deficiency, unspecified: Secondary | ICD-10-CM

## 2019-05-09 DIAGNOSIS — Z23 Encounter for immunization: Secondary | ICD-10-CM | POA: Diagnosis not present

## 2019-05-09 DIAGNOSIS — G4733 Obstructive sleep apnea (adult) (pediatric): Secondary | ICD-10-CM | POA: Diagnosis not present

## 2019-05-09 DIAGNOSIS — C61 Malignant neoplasm of prostate: Secondary | ICD-10-CM

## 2019-05-09 DIAGNOSIS — I10 Essential (primary) hypertension: Secondary | ICD-10-CM

## 2019-05-09 LAB — CBC WITH DIFFERENTIAL/PLATELET
Basophils Absolute: 0 10*3/uL (ref 0.0–0.1)
Basophils Relative: 0.2 % (ref 0.0–3.0)
Eosinophils Absolute: 0.3 10*3/uL (ref 0.0–0.7)
Eosinophils Relative: 2.5 % (ref 0.0–5.0)
HCT: 41.6 % (ref 39.0–52.0)
Hemoglobin: 13.5 g/dL (ref 13.0–17.0)
Lymphocytes Relative: 22.2 % (ref 12.0–46.0)
Lymphs Abs: 2.5 10*3/uL (ref 0.7–4.0)
MCHC: 32.5 g/dL (ref 30.0–36.0)
MCV: 81.5 fl (ref 78.0–100.0)
Monocytes Absolute: 1.1 10*3/uL — ABNORMAL HIGH (ref 0.1–1.0)
Monocytes Relative: 9.8 % (ref 3.0–12.0)
Neutro Abs: 7.3 10*3/uL (ref 1.4–7.7)
Neutrophils Relative %: 65.3 % (ref 43.0–77.0)
Platelets: 282 10*3/uL (ref 150.0–400.0)
RBC: 5.1 Mil/uL (ref 4.22–5.81)
RDW: 17.2 % — ABNORMAL HIGH (ref 11.5–15.5)
WBC: 11.2 10*3/uL — ABNORMAL HIGH (ref 4.0–10.5)

## 2019-05-09 LAB — VITAMIN D 25 HYDROXY (VIT D DEFICIENCY, FRACTURES): VITD: 35.21 ng/mL (ref 30.00–100.00)

## 2019-05-09 LAB — COMPREHENSIVE METABOLIC PANEL
ALT: 14 U/L (ref 0–53)
AST: 16 U/L (ref 0–37)
Albumin: 4 g/dL (ref 3.5–5.2)
Alkaline Phosphatase: 55 U/L (ref 39–117)
BUN: 20 mg/dL (ref 6–23)
CO2: 36 mEq/L — ABNORMAL HIGH (ref 19–32)
Calcium: 9.8 mg/dL (ref 8.4–10.5)
Chloride: 92 mEq/L — ABNORMAL LOW (ref 96–112)
Creatinine, Ser: 0.95 mg/dL (ref 0.40–1.50)
GFR: 97.05 mL/min (ref >60.00)
Glucose, Bld: 96 mg/dL (ref 70–99)
Potassium: 3.4 mEq/L — ABNORMAL LOW (ref 3.5–5.1)
Sodium: 135 mEq/L (ref 135–145)
Total Bilirubin: 0.5 mg/dL (ref 0.2–1.2)
Total Protein: 7.6 g/dL (ref 6.0–8.3)

## 2019-05-09 LAB — HEMOGLOBIN A1C: Hgb A1c MFr Bld: 5.9 % (ref 4.6–6.5)

## 2019-05-09 LAB — LIPID PANEL
Cholesterol: 167 mg/dL (ref 0–200)
HDL: 43.5 mg/dL (ref >39.00)
LDL Cholesterol: 101 mg/dL — ABNORMAL HIGH (ref 0–99)
NonHDL: 123.5
Total CHOL/HDL Ratio: 4
Triglycerides: 112 mg/dL (ref 0.0–149.0)
VLDL: 22.4 mg/dL (ref 0.0–40.0)

## 2019-05-09 LAB — TSH: TSH: 3.71 u[IU]/mL (ref 0.35–4.50)

## 2019-05-09 NOTE — Patient Instructions (Signed)
It was great seeing you today!   You are doing amazing. Keep up the good work   We will follow up with you regarding your blood work

## 2019-05-09 NOTE — Progress Notes (Signed)
Subjective:    Patient ID: Jonathan [name], male    DOB: 1955/08/07, 64 y.o.   MRN: [id number]  HPI Patient presents for yearly preventative medicine examination. He is a pleasant 64 year old male who  has a past medical history of Anxiety, Arthritis, Asthma, BPH (benign prostatic hyperplasia), Chest pain, ED (erectile dysfunction), Elevated PSA, Esophageal reflux, Hearing loss, History of hip replacement, History of knee replacement, Hypertension, Joint pain, Lactose intolerance, Numbness in both legs, Pre-diabetes, Prostate cancer ([place]) (Jan 2016), Sleep apnea, and Vitamin D deficiency.  Essential Hypertension -he is currently prescribed atenolol/chlorthalidone 50-25 mg daily.  He denies dizziness, lightheadedness, chest pain, or shortness of breath.  He does take K-Dur 10 mEq due to hypokalemia BP Readings from Last 3 Encounters:  05/09/19 130/72  04/25/19 128/68  04/02/19 130/79    Hyperlipidemia -had mildly elevated LDL on last lipid panel.  Is not currently on any medication Lab Results  Component Value Date   CHOL 175 04/06/2018   HDL 43.50 04/06/2018   LDLCALC 102 (H) 04/06/2018   TRIG 149.0 04/06/2018   CHOLHDL 4 04/06/2018    Obesity -he started working with healthy weight and wellness clinic in May 2020 at which time his starting weight was 319 pounds.  Over the course of 14 visits as of this date he has been able to lose 38 pounds.  Is working with weights about 45 minutes 3 times a week and doing cardio 20 minutes 3 times per week.  Following their eating plan approximately 80% of the time  Wt Readings from Last 3 Encounters:  05/09/19 286 lb (129.7 kg)  04/25/19 281 lb (127.5 kg)  04/02/19 279 lb (126.6 kg)     OSA -is using his CPAP nightly.  Is getting good sleep and denies fatigue during the day  Pre diabetes  -not currently on any medication.  He has been working on intensive lifestyle modifications including diet, exercise, and weight loss to help control his  blood glucose levels  Lab Results  Component Value Date   HGBA1C 6.1 (H) 01/04/2019   Vitamin D Deficiency -currently prescribed vitamin D 50,000 units weekly  H/o Prostate Cancer -was diagnosed in 2014 when his PSA was 9.3 he had a radical prostatectomy at this time.  He is followed by urology in [facility] every six month  All immunizations and health maintenance protocols were reviewed with the patient and needed orders were placed. He is due for shingles vaccination. He went to get it at the pharmacy and was told that there was going to be a cost of $150 for him to get it. He would like to get it here.   Appropriate screening laboratory values were ordered for the patient including screening of hyperlipidemia, renal function and hepatic function. If indicated by BPH, a PSA was ordered.  Medication reconciliation,  past medical history, social history, problem list and allergies were reviewed in detail with the patient  Goals were established with regard to weight loss, exercise, and  diet in compliance with medications  Wt Readings from Last 3 Encounters:  05/09/19 286 lb (129.7 kg)  04/25/19 281 lb (127.5 kg)  04/02/19 279 lb (126.6 kg)   End of life planning was discussed.  Overall, he reports " I am feeling really good!"   Review of Systems  Constitutional: Negative.   HENT: Negative.   Eyes: Negative.   Respiratory: Negative.   Cardiovascular: Negative.   Gastrointestinal: Negative.   Endocrine: Negative.  Genitourinary: Negative.   Musculoskeletal: Negative.   Skin: Negative.   Allergic/Immunologic: Negative.   Neurological: Negative.   Hematological: Negative.   Psychiatric/Behavioral: Negative.   All other systems reviewed and are negative.  Past Medical History:  Diagnosis Date  . Anxiety   . Arthritis   . Asthma   . BPH (benign prostatic hyperplasia)   . Chest pain   . ED (erectile dysfunction)   . Elevated PSA    9.33  . Esophageal  reflux   . Hearing loss   . History of hip replacement   . History of knee replacement   . Hypertension   . Joint pain   . Lactose intolerance   . Numbness in both legs   . Pre-diabetes   . Prostate cancer [facility]) Jan 2016   prostatectomy  . Sleep apnea   . Vitamin D deficiency     Social History   Socioeconomic History  . Marital status: Married    Spouse name: Not on file  . Number of children: 2  . Years of education: Not on file  . Highest education level: Not on file  Occupational History  . Occupation: Education administrator: [employer]  Tobacco Use  . Smoking status: Never Smoker  . Smokeless tobacco: Never Used  Substance and Sexual Activity  . Alcohol use: No  . Drug use: No  . Sexual activity: Not on file  Other Topics Concern  . Not on file  Social History Narrative  . Not on file   Social Determinants of Health   Financial Resource Strain:   . Difficulty of Paying Living Expenses: Not on file  Food Insecurity:   . Worried About Charity fundraiser in the Last Year: Not on file  . Ran Out of Food in the Last Year: Not on file  Transportation Needs:   . Lack of Transportation (Medical): Not on file  . Lack of Transportation (Non-Medical): Not on file  Physical Activity:   . Days of Exercise per Week: Not on file  . Minutes of Exercise per Session: Not on file  Stress:   . Feeling of Stress : Not on file  Social Connections:   . Frequency of Communication with Friends and Family: Not on file  . Frequency of Social Gatherings with Friends and Family: Not on file  . Attends Religious Services: Not on file  . Active Member of Clubs or Organizations: Not on file  . Attends Archivist Meetings: Not on file  . Marital Status: Not on file  Intimate Partner Violence:   . Fear of Current or Ex-Partner: Not on file  . Emotionally Abused: Not on file  . Physically Abused: Not on file  . Sexually Abused: Not on file    Past Surgical History:  Procedure  Laterality Date  . CORONARY CALCIUM SCORE / CARDIAC CT ANGIOGRAM  10/24/2017   Cor Ca Score = 0 - Low risk. Cor CTA: only mild plaque noted in mRCA with mLAD intramyocardial bridging noted.  [name] FOOT SURGERY     bilateral foot  . NM MYOVIEW LTD  07/2007   Normal nuclear stress test.  Mild fixed defect in the anterior wall likely related to soft tissue attenuation.  Normal EF.  Fair exercise capacity.  [name] PROSTATE BIOPSY  06/13/2012   right lat mid gleason 3+3=6  . PROSTATECTOMY  2016  . TOTAL HIP ARTHROPLASTY  2008 &2007   Right and left    Family History  Problem Relation Age of Onset  . Hypertension Mother   . Colon cancer Mother   . Obesity Mother   . Prostate cancer Father 38       alive now age 74  . Asthma Father   . Heart disease Father        Pt is not sure of details (no MI)  . Alcoholism Father   . Obesity Father   . Lung cancer Maternal Grandmother        non-smoker  . Tuberculosis Brother     Allergies  Allergen Reactions  . Codeine     REACTION: emesis  . Cozaar [Losartan Potassium] Hives    Current Outpatient Medications on File Prior to Visit  Medication Sig Dispense Refill  . Ascorbic Acid (VITAMIN C) 1000 MG tablet Take 1,000 mg by mouth daily.    [name] atenolol-chlorthalidone (TENORETIC) 50-25 MG tablet TAKE 1 TABLET BY MOUTH EVERY DAY 90 tablet 2  . Cholecalciferol (VITAMIN D3) 250 MCG (10000 UT) TABS Take by mouth.    . Echinacea 500 MG CAPS Take by mouth.    [name] ibuprofen (ADVIL) 200 MG tablet Take 200 mg by mouth every 6 (six) hours as needed.    . potassium chloride (KLOR-CON) 10 MEQ tablet Take 1 tablet (10 mEq total) by mouth daily. 30 tablet 0  . Vitamin D, Ergocalciferol, (DRISDOL) 1.25 MG (50000 UT) CAPS capsule Take 1 capsule (50,000 Units total) by mouth every 7 (seven) days. 4 capsule 0  . zinc gluconate 50 MG tablet Take 50 mg by mouth daily.     No current facility-administered medications on file prior to visit.    BP 130/72   Pulse (!) 50    Temp (!) 96.9 F (36.1 C) (Other (Comment))   Ht 5\' 10"  (1.778 m)   Wt 286 lb (129.7 kg)   SpO2 95%   BMI 41.04 kg/m       Objective:   Physical Exam Vitals and nursing note reviewed.  Constitutional:      General: He is not in acute distress.    Appearance: He is well-developed. He is obese. He is not diaphoretic.  HENT:     Head: Normocephalic and atraumatic.     Right Ear: Tympanic membrane, ear canal and external ear normal. There is no impacted cerumen.     Left Ear: Tympanic membrane, ear canal and external ear normal. There is no impacted cerumen.     Nose: Nose normal. No congestion or rhinorrhea.     Mouth/Throat:     Mouth: Mucous membranes are moist.     Pharynx: Oropharynx is clear. No oropharyngeal exudate or posterior oropharyngeal erythema.  Eyes:     General:        Right eye: No discharge.        Left eye: No discharge.     Conjunctiva/sclera: Conjunctivae normal.     Pupils: Pupils are equal, round, and reactive to light.  Neck:     Thyroid: No thyromegaly.     Trachea: No tracheal deviation.  Cardiovascular:     Rate and Rhythm: Normal rate and regular rhythm.     Pulses: Normal pulses.     Heart sounds: Normal heart sounds. No murmur. No friction rub. No gallop.   Pulmonary:     Effort: Pulmonary effort is normal. No respiratory distress.     Breath sounds: Normal breath sounds. No stridor. No wheezing, rhonchi or rales.  Chest:     Chest wall:  No tenderness.  Abdominal:     General: Abdomen is flat. Bowel sounds are normal. There is no distension.     Palpations: Abdomen is soft. There is no mass.     Tenderness: There is no abdominal tenderness. There is no right CVA tenderness, left CVA tenderness, guarding or rebound.     Hernia: No hernia is present.  Musculoskeletal:        General: No swelling, tenderness, deformity or signs of injury. Normal range of motion.     Right lower leg: No edema.     Left lower leg: No edema.  Lymphadenopathy:       Cervical: No cervical adenopathy.  Skin:    General: Skin is warm and dry.     Capillary Refill: Capillary refill takes less than 2 seconds.     Coloration: Skin is not jaundiced or pale.     Findings: No bruising, erythema, lesion or rash.  Neurological:     General: No focal deficit present.     Mental Status: He is alert and oriented to person, place, and time. Mental status is at baseline.     Cranial Nerves: No cranial nerve deficit.     Coordination: Coordination normal.  Psychiatric:        Mood and Affect: Mood normal.        Behavior: Behavior normal.        Thought Content: Thought content normal.        Judgment: Judgment normal.       Assessment & Plan:  1. Routine general medical examination at a health care facility - Continue with weight loss through heart healthy diet and exercise - Follow up in one year or sooner if needed - CBC with Differential/Platelet - Comprehensive metabolic panel - Hemoglobin A1c - Lipid panel - TSH - Vitamin D, 25-hydroxy  2. Essential hypertension - No change in BP medications  - CBC with Differential/Platelet - Comprehensive metabolic panel - Hemoglobin A1c - Lipid panel - TSH - Vitamin D, 25-hydroxy  3. Other hyperlipidemia  - CBC with Differential/Platelet - Comprehensive metabolic panel - Hemoglobin A1c - Lipid panel - TSH - Vitamin D, 25-hydroxy  4. OSA on CPAP - Continue with CPAP - CBC with Differential/Platelet - Comprehensive metabolic panel - Hemoglobin A1c - Lipid panel - TSH - Vitamin D, 25-hydroxy  5. Vitamin D deficiency  - CBC with Differential/Platelet - Comprehensive metabolic panel - Hemoglobin A1c - Lipid panel - TSH - Vitamin D, 25-hydroxy  6. Prediabetes - Has improved. Likely does not need medication - Continue with extensive lifestyle modifications  - CBC with Differential/Platelet - Comprehensive metabolic panel - Hemoglobin A1c - Lipid panel - TSH - Vitamin D,  25-hydroxy  7. Prostate CA [facility]) - Follow up with Urology as directed   8. Class 2 severe obesity with serious comorbidity and body mass index (BMI) of 39.0 to 39.9 in adult, unspecified obesity type ([place]) - Congratulated on weight loss.  - Follow up with weight loss clinic  - CBC with Differential/Platelet - Comprehensive metabolic panel - Hemoglobin A1c - Lipid panel - TSH - Vitamin D, 25-hydroxy  9. Need for shingles vaccine - We discussed that he would likely have a cost associated with the shingles vaccination if he got it in our office. He is ok with this and will get his first vaccination today  - Varicella-zoster vaccine IM   Dorothyann Peng, NP   This visit occurred during the SARS-CoV-2 public health emergency.  Safety protocols were in place, including screening questions prior to the visit, additional usage of staff PPE, and extensive cleaning of exam room while observing appropriate contact time as indicated for disinfecting solutions.

## 2019-05-10 ENCOUNTER — Ambulatory Visit (INDEPENDENT_AMBULATORY_CARE_PROVIDER_SITE_OTHER): Payer: Medicare Other | Admitting: Bariatrics

## 2019-05-10 ENCOUNTER — Encounter (INDEPENDENT_AMBULATORY_CARE_PROVIDER_SITE_OTHER): Payer: Self-pay | Admitting: Bariatrics

## 2019-05-10 VITALS — BP 129/74 | HR 52 | Temp 97.7°F | Ht 71.0 in | Wt 279.0 lb

## 2019-05-10 DIAGNOSIS — Z6839 Body mass index (BMI) 39.0-39.9, adult: Secondary | ICD-10-CM | POA: Diagnosis not present

## 2019-05-10 DIAGNOSIS — E876 Hypokalemia: Secondary | ICD-10-CM

## 2019-05-10 DIAGNOSIS — E559 Vitamin D deficiency, unspecified: Secondary | ICD-10-CM | POA: Diagnosis not present

## 2019-05-10 MED ORDER — POTASSIUM CHLORIDE ER 10 MEQ PO TBCR: 10.0000 meq | EXTENDED_RELEASE_TABLET | Freq: Every day | ORAL | 0 refills | Status: DC

## 2019-05-10 NOTE — Progress Notes (Signed)
Chief Complaint:   OBESITY Jonathan [name] is here to discuss his progress with his obesity treatment plan along with follow-up of his obesity related diagnoses. Jonathan [name] is on the Category 3 Plan and states he is following his eating plan approximately 75% of the time. [name] states he is doing weights 30 minutes 3 times per week and walking 30 minutes 3 times per week.  Today's visit was #: 15 Starting weight: 319 lbs Starting date: 08/28/2018 Today's weight: 279 lbs Today's date: 05/10/2019 Total lbs lost to date: 40 Total lbs lost since last in-office visit: 2  Interim History: Jonathan [name] is down 2 lbs. He is doing okay and is doing well with his water intake.   Subjective:   Hypokalemia. Last potassium level low at 3.4 on 05/09/2019. He denies cramping.  Vitamin D deficiency. Last Vitamin D level 35.21 on 05/09/2019. No nausea, vomiting, or muscle weakness.  Assessment/Plan:   Hypokalemia. He was given a prescription for potassium chloride (KLOR-CON) 10 MEQ tablet 1 tablet by mouth daily #30 with 0 refills.  Vitamin D deficiency. Low Vitamin D level contributes to fatigue and are associated with obesity, breast, and colon cancer. He agrees to continue to take Vitamin D and will follow-up for routine testing of Vitamin D, at least 2-3 times per year to avoid over-replacement.  Class 2 severe obesity with serious comorbidity and body mass index (BMI) of 39.0 to 39.9 in adult, unspecified obesity type ([place]).  Jonathan [name] is currently in the action stage of change. As such, his goal is to continue with weight loss efforts. He has agreed to the Category 3 Plan.   He will work on meal planning and intentional eating.  Exercise goals: Jonathan [name] will continue weights and walking 30 minutes 3 times per week.  Behavioral modification strategies: increasing lean protein intake, decreasing simple carbohydrates, increasing vegetables, increasing water intake, decreasing eating out, no skipping  meals, meal planning and cooking strategies and keeping healthy foods in the home.  Jonathan [name] has agreed to follow-up with our clinic in 2 weeks. He was informed of the importance of frequent follow-up visits to maximize his success with intensive lifestyle modifications for his multiple health conditions.   Objective:   Blood pressure 129/74, pulse (!) 52, temperature 97.7 F (36.5 C), temperature source Oral, height 5\' 11"  (1.803 m), weight 279 lb (126.6 kg), SpO2 94 %. Body mass index is 38.91 kg/m.  General: Cooperative, alert, well developed, in no acute distress. HEENT: Conjunctivae and lids unremarkable. Cardiovascular: Regular rhythm.  Lungs: Normal work of breathing. Neurologic: No focal deficits.   Lab Results  Component Value Date   CREATININE 0.95 05/09/2019   BUN 20 05/09/2019   NA 135 05/09/2019   K 3.4 (L) 05/09/2019   CL 92 (L) 05/09/2019   CO2 36 (H) 05/09/2019   Lab Results  Component Value Date   ALT 14 05/09/2019   AST 16 05/09/2019   ALKPHOS 55 05/09/2019   BILITOT 0.5 05/09/2019   Lab Results  Component Value Date   HGBA1C 5.9 05/09/2019   HGBA1C 6.1 (H) 01/04/2019   HGBA1C 6.5 (H) 08/28/2018   HGBA1C 6.5 04/06/2018   HGBA1C 6.0 04/02/2016   Lab Results  Component Value Date   INSULIN 46.3 (H) 08/28/2018   Lab Results  Component Value Date   TSH 3.71 05/09/2019   Lab Results  Component Value Date   CHOL 167 05/09/2019   HDL 43.50 05/09/2019   LDLCALC 101 (H) 05/09/2019  TRIG 112.0 05/09/2019   CHOLHDL 4 05/09/2019   Lab Results  Component Value Date   WBC 11.2 (H) 05/09/2019   HGB 13.5 05/09/2019   HCT 41.6 05/09/2019   MCV 81.5 05/09/2019   PLT 282.0 05/09/2019   Lab Results  Component Value Date   IRON 150 12/15/2011   FERRITIN 9.2 (L) 12/15/2011    Obesity Behavioral Intervention Documentation for Insurance:   Approximately 15 minutes were spent on the discussion below.  ASK: We discussed the diagnosis of obesity with  Jonathan [name] today and Jonathan [name] agreed to give [name] permission to discuss obesity behavioral modification therapy today.  ASSESS: [name] has the diagnosis of obesity and his BMI today is 39.0. [name] is in the action stage of change.   ADVISE: Jonathan [name] was educated on the multiple health risks of obesity as well as the benefit of weight loss to improve his health. He was advised of the need for long term treatment and the importance of lifestyle modifications to improve his current health and to decrease his risk of future health problems.  AGREE: Multiple dietary modification options and treatment options were discussed and Jonathan [name] agreed to follow the recommendations documented in the above note.  ARRANGE: Jonathan [name] was educated on the importance of frequent visits to treat obesity as outlined per CMS and USPSTF guidelines and agreed to schedule his next follow up appointment today.  Attestation Statements:   Reviewed by clinician on day of visit: allergies, medications, problem list, medical history, surgical history, family history, social history, and previous encounter notes.  Jonathan [name], am acting as Location manager for [employer], DO   I have reviewed the above documentation for accuracy and completeness, and I agree with the above. Jonathan Lesch, DO

## 2019-05-31 ENCOUNTER — Ambulatory Visit (INDEPENDENT_AMBULATORY_CARE_PROVIDER_SITE_OTHER): Payer: Medicare Other | Admitting: Bariatrics

## 2019-06-04 ENCOUNTER — Ambulatory Visit (INDEPENDENT_AMBULATORY_CARE_PROVIDER_SITE_OTHER): Payer: Medicare Other | Admitting: Bariatrics

## 2019-06-04 ENCOUNTER — Other Ambulatory Visit: Payer: Self-pay

## 2019-06-04 ENCOUNTER — Encounter (INDEPENDENT_AMBULATORY_CARE_PROVIDER_SITE_OTHER): Payer: Self-pay | Admitting: Bariatrics

## 2019-06-04 VITALS — BP 126/76 | HR 48 | Temp 97.6°F | Ht 71.0 in | Wt 280.0 lb

## 2019-06-04 DIAGNOSIS — I10 Essential (primary) hypertension: Secondary | ICD-10-CM | POA: Diagnosis not present

## 2019-06-04 DIAGNOSIS — Z6839 Body mass index (BMI) 39.0-39.9, adult: Secondary | ICD-10-CM

## 2019-06-04 DIAGNOSIS — E559 Vitamin D deficiency, unspecified: Secondary | ICD-10-CM | POA: Diagnosis not present

## 2019-06-04 NOTE — Progress Notes (Signed)
Chief Complaint:   OBESITY Jonathan [name] is here to discuss his progress with his obesity treatment plan along with follow-up of his obesity related diagnoses. Jonathan [name] is on the Category 3 Plan and states he is following his eating plan approximately 50% of the time. Jonathan [name] states he is exercising 0 minutes 0 times per week.  Today's visit was #: 16 Starting weight: 319 lbs Starting date: 08/28/2018 Today's weight: 280 lbs  Today's date: 06/04/2019 Total lbs lost to date: 39 Total lbs lost since last in-office visit: 0  Interim History: Jonathan [name] is up 1 lb. He has been having a lot of hip and joint pain and is going to see his orthopedist. He reports doing well with his water intake.   Subjective:   Vitamin D deficiency. Jonathan [name] is taking OTC Vitamin D. Last Vitamin D level 35.21 on 05/09/2019.  Essential hypertension. Blood pressure is well controlled.  BP Readings from Last 3 Encounters:  06/04/19 126/76  05/10/19 129/74  05/09/19 130/72   Lab Results  Component Value Date   CREATININE 0.95 05/09/2019   CREATININE 0.99 01/04/2019   CREATININE 0.86 08/28/2018   Assessment/Plan:   Vitamin D deficiency. Low Vitamin D level contributes to fatigue and are associated with obesity, breast, and colon cancer. He was given a prescription for Vitamin D @50 ,000 IU every week #4 with 0 refills and will follow-up for routine testing of Vitamin D, at least 2-3 times per year to avoid over-replacement.  Essential hypertension. Jonathan [name] is working on healthy weight loss and exercise to improve blood pressure control. We will watch for signs of hypotension as he continues his lifestyle modifications. He will continue his medications as directed.  Class 2 severe obesity with serious comorbidity and body mass index (BMI) of 39.0 to 39.9 in adult, unspecified obesity type (Jonathan [name]).  Jonathan [name] is currently in the action stage of change. As such, his goal is to continue with weight loss efforts. He  has agreed to the Category 3 Plan.   He will work on meal planning and intentional eating.  Exercise goals: Jonathan [name] will follow-up with his orthopedist.  Behavioral modification strategies: increasing lean protein intake, decreasing simple carbohydrates, increasing vegetables, increasing water intake, decreasing eating out, no skipping meals, meal planning and cooking strategies, keeping healthy foods in the home and planning for success.  Jonathan [name] has agreed to follow-up with our clinic in 3 weeks. He was informed of the importance of frequent follow-up visits to maximize his success with intensive lifestyle modifications for his multiple health conditions.   Objective:   Blood pressure 126/76, pulse (!) 48, temperature 97.6 F (36.4 C), height 5\' 11"  (1.803 m), weight 280 lb (127 kg), SpO2 99 %. Body mass index is 39.05 kg/m.  General: Cooperative, alert, well developed, in no acute distress. HEENT: Conjunctivae and lids unremarkable. Cardiovascular: Regular rhythm.  Lungs: Normal work of breathing. Neurologic: No focal deficits.   Lab Results  Component Value Date   CREATININE 0.95 05/09/2019   BUN 20 05/09/2019   NA 135 05/09/2019   K 3.4 (L) 05/09/2019   CL 92 (L) 05/09/2019   CO2 36 (H) 05/09/2019   Lab Results  Component Value Date   ALT 14 05/09/2019   AST 16 05/09/2019   ALKPHOS 55 05/09/2019   BILITOT 0.5 05/09/2019   Lab Results  Component Value Date   HGBA1C 5.9 05/09/2019   HGBA1C 6.1 (H) 01/04/2019   HGBA1C 6.5 (H) 08/28/2018   HGBA1C 6.5 04/06/2018  HGBA1C 6.0 04/02/2016   Lab Results  Component Value Date   INSULIN 46.3 (H) 08/28/2018   Lab Results  Component Value Date   TSH 3.71 05/09/2019   Lab Results  Component Value Date   CHOL 167 05/09/2019   HDL 43.50 05/09/2019   LDLCALC 101 (H) 05/09/2019   TRIG 112.0 05/09/2019   CHOLHDL 4 05/09/2019   Lab Results  Component Value Date   WBC 11.2 (H) 05/09/2019   HGB 13.5 05/09/2019   HCT  41.6 05/09/2019   MCV 81.5 05/09/2019   PLT 282.0 05/09/2019   Lab Results  Component Value Date   IRON 150 12/15/2011   FERRITIN 9.2 (L) 12/15/2011   Obesity Behavioral Intervention Documentation for Insurance:   Approximately 15 minutes were spent on the discussion below.  ASK: We discussed the diagnosis of obesity with Jonathan [name] today and Jonathan [name] agreed to give [name] permission to discuss obesity behavioral modification therapy today.  ASSESS: Jonathan [name] has the diagnosis of obesity and his BMI today is 39.1. Jonathan [name] is in the action stage of change.   ADVISE: Jonathan [name] was educated on the multiple health risks of obesity as well as the benefit of weight loss to improve his health. He was advised of the need for long term treatment and the importance of lifestyle modifications to improve his current health and to decrease his risk of future health problems.  AGREE: Multiple dietary modification options and treatment options were discussed and Jonathan [name] agreed to follow the recommendations documented in the above note.  ARRANGE: Jonathan [name] was educated on the importance of frequent visits to treat obesity as outlined per CMS and USPSTF guidelines and agreed to schedule his next follow up appointment today.  Attestation Statements:   Reviewed by clinician on day of visit: allergies, medications, problem list, medical history, surgical history, family history, social history, and previous encounter notes.  [name], am acting as Location manager for [employer], DO   I have reviewed the above documentation for accuracy and completeness, and I agree with the above. Jonathan Lesch, DO

## 2019-06-07 ENCOUNTER — Other Ambulatory Visit (INDEPENDENT_AMBULATORY_CARE_PROVIDER_SITE_OTHER): Payer: Self-pay

## 2019-06-07 ENCOUNTER — Telehealth (INDEPENDENT_AMBULATORY_CARE_PROVIDER_SITE_OTHER): Payer: Self-pay | Admitting: Bariatrics

## 2019-06-07 DIAGNOSIS — E559 Vitamin D deficiency, unspecified: Secondary | ICD-10-CM

## 2019-06-07 MED ORDER — VITAMIN D (ERGOCALCIFEROL) 1.25 MG (50000 UNIT) PO CAPS: 50000.0000 [IU] | ORAL_CAPSULE | ORAL | 0 refills | Status: DC

## 2019-06-07 NOTE — Telephone Encounter (Signed)
Rx sent pharm, pt notified.  Kristofor Michalowski R CMA

## 2019-06-18 ENCOUNTER — Ambulatory Visit: Payer: Medicare Other | Admitting: Pulmonary Disease

## 2019-06-20 DIAGNOSIS — R109 Unspecified abdominal pain: Secondary | ICD-10-CM | POA: Diagnosis not present

## 2019-06-21 ENCOUNTER — Other Ambulatory Visit: Payer: Self-pay | Admitting: Gastroenterology

## 2019-06-21 DIAGNOSIS — R109 Unspecified abdominal pain: Secondary | ICD-10-CM

## 2019-06-25 ENCOUNTER — Other Ambulatory Visit: Payer: Self-pay

## 2019-06-25 ENCOUNTER — Ambulatory Visit (INDEPENDENT_AMBULATORY_CARE_PROVIDER_SITE_OTHER): Payer: Medicare Other | Admitting: Bariatrics

## 2019-06-25 ENCOUNTER — Encounter (INDEPENDENT_AMBULATORY_CARE_PROVIDER_SITE_OTHER): Payer: Self-pay | Admitting: Bariatrics

## 2019-06-25 VITALS — BP 132/66 | HR 46 | Temp 97.7°F | Ht 71.0 in | Wt 284.0 lb

## 2019-06-25 DIAGNOSIS — Z6839 Body mass index (BMI) 39.0-39.9, adult: Secondary | ICD-10-CM | POA: Diagnosis not present

## 2019-06-25 DIAGNOSIS — E876 Hypokalemia: Secondary | ICD-10-CM

## 2019-06-25 DIAGNOSIS — E559 Vitamin D deficiency, unspecified: Secondary | ICD-10-CM | POA: Diagnosis not present

## 2019-06-25 MED ORDER — VITAMIN D (ERGOCALCIFEROL) 1.25 MG (50000 UNIT) PO CAPS: 50000.0000 [IU] | ORAL_CAPSULE | ORAL | 0 refills | Status: DC

## 2019-06-25 MED ORDER — POTASSIUM CHLORIDE ER 10 MEQ PO TBCR: 10.0000 meq | EXTENDED_RELEASE_TABLET | Freq: Every day | ORAL | 0 refills | Status: DC

## 2019-06-25 NOTE — Progress Notes (Signed)
Chief Complaint:   OBESITY Jonathan [name] is here to discuss his progress with his obesity treatment plan along with follow-up of his obesity related diagnoses. Jonathan [name] is on the Category 3 Plan and states he is following his eating plan approximately 25% of the time. [name] states he is exercising 0 minutes 0 times per week.  Today's visit was #: 71 Starting weight: 319 lbs Starting date: 08/28/2018 Today's weight: 284 lbs Today's date: 06/25/2019 Total lbs lost to date: 35 Total lbs lost since last in-office visit: 0  Interim History: [name] is up 4 lbs from his last visit. He has been under a lot of stress due to the death of a friend.  Subjective:   Vitamin D deficiency.  Last Vitamin D 35.21 on 05/09/2019.  Hypokalemia. Bruce takes atenolol and chlorthalidone.  Assessment/Plan:   Vitamin D deficiency. Low Vitamin D level contributes to fatigue and are associated with obesity, breast, and colon cancer. He was given a prescription for Vitamin D, Ergocalciferol, (DRISDOL) 1.25 MG (50000 UNIT) CAPS capsule every week #4 with 0 refills and will follow-up for routine testing of Vitamin D, at least 2-3 times per year to avoid over-replacement.   Hypokalemia. Power was given a prescription for potassium chloride (KLOR-CON) 10 MEQ tablet 1 daily #30 with 0 refills. He will continue his medications as directed.  Class 2 severe obesity with serious comorbidity and body mass index (BMI) of 39.0 to 39.9 in adult, unspecified obesity type ([place]).  Jonathan [name] is currently in the action stage of change. As such, his goal is to continue with weight loss efforts. He has agreed to the Category 3 Plan.   He will work on meal planning, intentional eating, and increasing his protein and water intake.  Exercise goals: Jonathan [name] started back to the gym last week.  Behavioral modification strategies: increasing lean protein intake, decreasing simple carbohydrates, increasing vegetables, increasing  water intake, decreasing eating out, no skipping meals, meal planning and cooking strategies, keeping healthy foods in the home and planning for success.  Jonathan [name] has agreed to follow-up with our clinic in 2 weeks. He was informed of the importance of frequent follow-up visits to maximize his success with intensive lifestyle modifications for his multiple health conditions.   Objective:   Blood pressure 132/66, pulse (!) 46, temperature 97.7 F (36.5 C), height 5\' 11"  (1.803 m), weight 284 lb (128.8 kg), SpO2 100 %. Body mass index is 39.61 kg/m.  General: Cooperative, alert, well developed, in no acute distress. HEENT: Conjunctivae and lids unremarkable. Cardiovascular: Regular rhythm.  Lungs: Normal work of breathing. Neurologic: No focal deficits.   Lab Results  Component Value Date   CREATININE 0.95 05/09/2019   BUN 20 05/09/2019   NA 135 05/09/2019   K 3.4 (L) 05/09/2019   CL 92 (L) 05/09/2019   CO2 36 (H) 05/09/2019   Lab Results  Component Value Date   ALT 14 05/09/2019   AST 16 05/09/2019   ALKPHOS 55 05/09/2019   BILITOT 0.5 05/09/2019   Lab Results  Component Value Date   HGBA1C 5.9 05/09/2019   HGBA1C 6.1 (H) 01/04/2019   HGBA1C 6.5 (H) 08/28/2018   HGBA1C 6.5 04/06/2018   HGBA1C 6.0 04/02/2016   Lab Results  Component Value Date   INSULIN 46.3 (H) 08/28/2018   Lab Results  Component Value Date   TSH 3.71 05/09/2019   Lab Results  Component Value Date   CHOL 167 05/09/2019   HDL 43.50 05/09/2019  LDLCALC 101 (H) 05/09/2019   TRIG 112.0 05/09/2019   CHOLHDL 4 05/09/2019   Lab Results  Component Value Date   WBC 11.2 (H) 05/09/2019   HGB 13.5 05/09/2019   HCT 41.6 05/09/2019   MCV 81.5 05/09/2019   PLT 282.0 05/09/2019   Lab Results  Component Value Date   IRON 150 12/15/2011   FERRITIN 9.2 (L) 12/15/2011   Obesity Behavioral Intervention Documentation for Insurance:   Approximately 15 minutes were spent on the discussion  below.  ASK: We discussed the diagnosis of obesity with Jonathan [name] today and Adit agreed to give [name] permission to discuss obesity behavioral modification therapy today.  ASSESS: Jonathan [name] has the diagnosis of obesity and his BMI today is 39.7. Jonathan [name] is in the action stage of change.   ADVISE: Jonathan [name] was educated on the multiple health risks of obesity as well as the benefit of weight loss to improve his health. He was advised of the need for long term treatment and the importance of lifestyle modifications to improve his current health and to decrease his risk of future health problems.  AGREE: Multiple dietary modification options and treatment options were discussed and Jonathan [name] agreed to follow the recommendations documented in the above note.  ARRANGE: Jonathan [name] was educated on the importance of frequent visits to treat obesity as outlined per CMS and USPSTF guidelines and agreed to schedule his next follow up appointment today.  Attestation Statements:   Reviewed by clinician on day of visit: allergies, medications, problem list, medical history, surgical history, family history, social history, and previous encounter notes.  Jonathan [name], am acting as Location manager for [employer], DO   I have reviewed the above documentation for accuracy and completeness, and I agree with the above. Jonathan Lesch, DO

## 2019-06-27 ENCOUNTER — Other Ambulatory Visit: Payer: Self-pay

## 2019-06-27 ENCOUNTER — Ambulatory Visit: Payer: Medicare Other | Admitting: Pulmonary Disease

## 2019-06-27 ENCOUNTER — Encounter: Payer: Self-pay | Admitting: Pulmonary Disease

## 2019-06-27 VITALS — BP 122/76 | HR 51 | Temp 97.7°F | Ht 71.0 in | Wt 289.0 lb

## 2019-06-27 DIAGNOSIS — Z9989 Dependence on other enabling machines and devices: Secondary | ICD-10-CM

## 2019-06-27 DIAGNOSIS — G4733 Obstructive sleep apnea (adult) (pediatric): Secondary | ICD-10-CM

## 2019-06-27 NOTE — Progress Notes (Signed)
Jonathan [name]    [id number]    1956/03/16  Primary Care Physician:Nafziger, Tommi Rumps, NP  Referring Physician: Dorothyann Peng, NP [place],  [place] [id number]  Chief complaint:   Shortness of breath  Obstructive sleep apnea  HPI: Has been doing well since her last visit Compliant with CPAP 100% compliant Feels better generally  He occasionally goes through spells of insomnia  He feels well generally Sleep quality is better with CPAP  Has lost over 30 pounds recently  Progressive shortness of breath over the last couple years  Loud breathing even when taking a nap during the day  Has gained a lot of weight since retiring from the [employer]-he is changing many things to help him lose weight Denies a headache, he does have a dry throat of the mornings History of prostate cancer Orthopnea  Occupation: Worked for the postal service Exposures: No exposure to mold Smoking history: Non-smoker  Outpatient Encounter Medications as of 06/27/2019  Medication Sig  . Ascorbic Acid (VITAMIN C) 1000 MG tablet Take 1,000 mg by mouth daily.  [name] atenolol-chlorthalidone (TENORETIC) 50-25 MG tablet TAKE 1 TABLET BY MOUTH EVERY DAY  . Cholecalciferol (VITAMIN D3) 250 MCG (10000 UT) TABS Take by mouth.  . Echinacea 500 MG CAPS Take by mouth.  [name] ibuprofen (ADVIL) 200 MG tablet Take 200 mg by mouth every 6 (six) hours as needed.  . potassium chloride (KLOR-CON) 10 MEQ tablet Take 1 tablet (10 mEq total) by mouth daily.  . Vitamin D, Ergocalciferol, (DRISDOL) 1.25 MG (50000 UNIT) CAPS capsule Take 1 capsule (50,000 Units total) by mouth every 7 (seven) days.  [name] zinc gluconate 50 MG tablet Take 50 mg by mouth daily.   No facility-administered encounter medications on file as of 06/27/2019.    Allergies as of 06/27/2019 - Review Complete 06/27/2019  Allergen Reaction Noted  . Codeine    . Cozaar [losartan potassium] Hives 09/08/2011    Past Medical History:    Diagnosis Date  . Anxiety   . Arthritis   . Asthma   . BPH (benign prostatic hyperplasia)   . Chest pain   . ED (erectile dysfunction)   . Elevated PSA    9.33  . Esophageal reflux   . Hearing loss   . History of hip replacement   . History of knee replacement   . Hypertension   . Joint pain   . Lactose intolerance   . Numbness in both legs   . Pre-diabetes   . Prostate cancer [facility]) Jan 2016   prostatectomy  . Sleep apnea   . Vitamin D deficiency     Past Surgical History:  Procedure Laterality Date  . CORONARY CALCIUM SCORE / CARDIAC CT ANGIOGRAM  10/24/2017   Cor Ca Score = 0 - Low risk. Cor CTA: only mild plaque noted in mRCA with mLAD intramyocardial bridging noted.  [name] FOOT SURGERY     bilateral foot  . NM MYOVIEW LTD  07/2007   Normal nuclear stress test.  Mild fixed defect in the anterior wall likely related to soft tissue attenuation.  Normal EF.  Fair exercise capacity.  [name] PROSTATE BIOPSY  06/13/2012   right lat mid gleason 3+3=6  . PROSTATECTOMY  2016  . TOTAL HIP ARTHROPLASTY  2008 &2007   Right and left    Family History  Problem Relation Age of Onset  . Hypertension Mother   . Colon cancer Mother   .  Obesity Mother   . Prostate cancer Father 86       alive now age 49  . Asthma Father   . Heart disease Father        Pt is not sure of details (no MI)  . Alcoholism Father   . Obesity Father   . Lung cancer Maternal Grandmother        non-smoker  . Tuberculosis Brother     Social History   Socioeconomic History  . Marital status: Married    Spouse name: Not on file  . Number of children: 2  . Years of education: Not on file  . Highest education level: Not on file  Occupational History  . Occupation: Education administrator: [employer]  Tobacco Use  . Smoking status: Never Smoker  . Smokeless tobacco: Never Used  Substance and Sexual Activity  . Alcohol use: No  . Drug use: No  . Sexual activity: Not on file  Other Topics Concern  . Not on file   Social History Narrative  . Not on file   Social Determinants of Health   Financial Resource Strain:   . Difficulty of Paying Living Expenses:   Food Insecurity:   . Worried About Charity fundraiser in the Last Year:   . Arboriculturist in the Last Year:   Transportation Needs:   . Film/video editor (Medical):   [name] Lack of Transportation (Non-Medical):   Physical Activity:   . Days of Exercise per Week:   . Minutes of Exercise per Session:   Stress:   . Feeling of Stress :   Social Connections:   . Frequency of Communication with Friends and Family:   . Frequency of Social Gatherings with Friends and Family:   . Attends Religious Services:   . Active Member of Clubs or Organizations:   . Attends Archivist Meetings:   [name] Marital Status:   Intimate Partner Violence:   . Fear of Current or Ex-Partner:   . Emotionally Abused:   [name] Physically Abused:   . Sexually Abused:     Review of systems: Review of Systems  Constitutional: Negative for fever and chills.  HENT: Negative Eyes: No visual blurring Respiratory: Shortness of breath with exertion, slightly better Cardiovascular: orthopnea, bradycardia Musculoskeletal: Negative for myalgias, back pain and joint pain.  All other systems reviewed and are negative.  Physical Exam:  Vitals:   06/27/19 1638  BP: 122/76  Pulse: (!) 51  Temp: 97.7 F (36.5 C)  SpO2: 98%   Gen:      No acute distress HEENT/neck: Mallampati 3, neck is supple with no JVD Lungs:    Clear breath sounds bilaterally CV:         Regular rate and rhythm; no murmurs    Results of the Epworth flowsheet 03/21/2018 11/22/2017  Sitting and reading 0 3  Watching TV 0 3  Sitting, inactive in a public place (e.g. a theatre or a meeting) 0 3  As a passenger in a car for an hour without a break 0 3  Lying down to rest in the afternoon when circumstances permit 3 3  Sitting and talking to someone 0 2  Sitting quietly after a lunch without  alcohol 0 2  In a car, while stopped for a few minutes in traffic 0 0  Total score 3 19    Data Reviewed: [name]  Recent CT, coronary CT noted  .  Cardiopulmonary exercise  study result noted -Compliance revealed 67% compliance, residual AHI of 5.7 -We discussed improving compliance  PFT shows no obstruction, no significant bronchodilator response, moderate restriction with mild reduction in diffusing capacity  Compliance data reveals 100% compliance with CPAP, sleeps about 8 hours, machine set between 5 and 20 95 percentile pressure of 13.9, AHI of 2.9  Assessment:  .  Obstructive sleep apnea adequately treated with CPAP therapy .  obstructive sleep apnea  .  Morbid obesity -is working on weight loss,working-has lost about 30 pounds since her last visit  .  Pulmonary hypertension .  His daytime sleepiness is improved significantly  Plan/Recommendations: .  Continue CPAP -Encouraged about improving compliance  .  Aggressive weight loss measures -Encouraged about diet and exercise -He is doing an excellent job at this  .  Regular exercises  I will see him back in the office in about 6 months    Sherrilyn Rist MD [facility] 06/27/2019, 4:49 PM  CC: Dorothyann Peng, NP

## 2019-06-27 NOTE — Patient Instructions (Signed)
Continue using CPAP on a regular basis  Call with any significant concerns  I will see you in 6 months

## 2019-06-28 ENCOUNTER — Ambulatory Visit: Payer: Medicare Other | Admitting: Pulmonary Disease

## 2019-07-04 ENCOUNTER — Ambulatory Visit
Admission: RE | Admit: 2019-07-04 | Discharge: 2019-07-04 | Disposition: A | Discharge status: Home / Self Care | Payer: Medicare Other | Source: Ambulatory Visit | Attending: Gastroenterology | Admitting: Gastroenterology

## 2019-07-04 ENCOUNTER — Other Ambulatory Visit (INDEPENDENT_AMBULATORY_CARE_PROVIDER_SITE_OTHER): Payer: Self-pay | Admitting: Bariatrics

## 2019-07-04 ENCOUNTER — Other Ambulatory Visit: Payer: Self-pay

## 2019-07-04 DIAGNOSIS — E559 Vitamin D deficiency, unspecified: Secondary | ICD-10-CM

## 2019-07-04 DIAGNOSIS — R109 Unspecified abdominal pain: Secondary | ICD-10-CM | POA: Diagnosis not present

## 2019-07-11 ENCOUNTER — Other Ambulatory Visit: Payer: Self-pay

## 2019-07-12 ENCOUNTER — Ambulatory Visit (INDEPENDENT_AMBULATORY_CARE_PROVIDER_SITE_OTHER): Payer: Medicare Other

## 2019-07-12 DIAGNOSIS — Z23 Encounter for immunization: Secondary | ICD-10-CM | POA: Diagnosis not present

## 2019-07-12 NOTE — Progress Notes (Signed)
Patient was given 2nd Shingles vaccine in right deltoid. Patient tolerated injection well.

## 2019-07-18 ENCOUNTER — Ambulatory Visit (INDEPENDENT_AMBULATORY_CARE_PROVIDER_SITE_OTHER): Payer: Medicare Other | Admitting: Bariatrics

## 2019-07-20 ENCOUNTER — Other Ambulatory Visit: Payer: Self-pay

## 2019-07-20 ENCOUNTER — Encounter (HOSPITAL_COMMUNITY): Payer: Self-pay

## 2019-07-20 ENCOUNTER — Ambulatory Visit (HOSPITAL_COMMUNITY)
Admission: EM | Admit: 2019-07-20 | Discharge: 2019-07-20 | Disposition: A | Discharge status: Home / Self Care | Payer: Medicare Other | Attending: Urgent Care | Admitting: Urgent Care

## 2019-07-20 ENCOUNTER — Inpatient Hospital Stay: Admission: RE | Admit: 2019-07-20 | Payer: Medicare Other | Source: Ambulatory Visit

## 2019-07-20 DIAGNOSIS — Z79899 Other long term (current) drug therapy: Secondary | ICD-10-CM | POA: Insufficient documentation

## 2019-07-20 DIAGNOSIS — I10 Essential (primary) hypertension: Secondary | ICD-10-CM | POA: Diagnosis not present

## 2019-07-20 DIAGNOSIS — E559 Vitamin D deficiency, unspecified: Secondary | ICD-10-CM | POA: Insufficient documentation

## 2019-07-20 DIAGNOSIS — N390 Urinary tract infection, site not specified: Secondary | ICD-10-CM | POA: Diagnosis not present

## 2019-07-20 DIAGNOSIS — Z6839 Body mass index (BMI) 39.0-39.9, adult: Secondary | ICD-10-CM | POA: Diagnosis not present

## 2019-07-20 DIAGNOSIS — M545 Low back pain: Secondary | ICD-10-CM | POA: Diagnosis not present

## 2019-07-20 DIAGNOSIS — K219 Gastro-esophageal reflux disease without esophagitis: Secondary | ICD-10-CM | POA: Diagnosis not present

## 2019-07-20 DIAGNOSIS — Z885 Allergy status to narcotic agent status: Secondary | ICD-10-CM | POA: Diagnosis not present

## 2019-07-20 DIAGNOSIS — Z20822 Contact with and (suspected) exposure to covid-19: Secondary | ICD-10-CM | POA: Insufficient documentation

## 2019-07-20 DIAGNOSIS — Z8546 Personal history of malignant neoplasm of prostate: Secondary | ICD-10-CM | POA: Insufficient documentation

## 2019-07-20 DIAGNOSIS — R319 Hematuria, unspecified: Secondary | ICD-10-CM

## 2019-07-20 DIAGNOSIS — G4733 Obstructive sleep apnea (adult) (pediatric): Secondary | ICD-10-CM | POA: Diagnosis not present

## 2019-07-20 DIAGNOSIS — Z9079 Acquired absence of other genital organ(s): Secondary | ICD-10-CM | POA: Diagnosis not present

## 2019-07-20 DIAGNOSIS — Z8249 Family history of ischemic heart disease and other diseases of the circulatory system: Secondary | ICD-10-CM | POA: Insufficient documentation

## 2019-07-20 DIAGNOSIS — E785 Hyperlipidemia, unspecified: Secondary | ICD-10-CM | POA: Insufficient documentation

## 2019-07-20 LAB — POCT URINALYSIS DIP (DEVICE)
Bilirubin Urine: NEGATIVE
Glucose, UA: NEGATIVE mg/dL
Ketones, ur: NEGATIVE mg/dL
Nitrite: NEGATIVE
Protein, ur: 100 mg/dL — AB
Specific Gravity, Urine: 1.01 (ref 1.005–1.030)
Urobilinogen, UA: 1 mg/dL (ref 0.0–1.0)
pH: 5.5 (ref 5.0–8.0)

## 2019-07-20 MED ORDER — SULFAMETHOXAZOLE-TRIMETHOPRIM 800-160 MG PO TABS: 1.0000 | ORAL_TABLET | Freq: Two times a day (BID) | ORAL | 0 refills | Status: AC

## 2019-07-20 MED ORDER — CEFTRIAXONE SODIUM 1 G IJ SOLR
1.0000 g | Freq: Once | INTRAMUSCULAR | Status: AC
  Administered 2019-07-20: 20:00:00 1 g via INTRAMUSCULAR

## 2019-07-20 MED ORDER — LIDOCAINE HCL (PF) 1 % IJ SOLN
INTRAMUSCULAR | Status: AC
  Filled 2019-07-20: qty 2

## 2019-07-20 MED ORDER — ONDANSETRON HCL 4 MG PO TABS: 4.0000 mg | ORAL_TABLET | Freq: Three times a day (TID) | ORAL | 0 refills | Status: DC | PRN

## 2019-07-20 MED ORDER — CEFTRIAXONE SODIUM 1 G IJ SOLR
INTRAMUSCULAR | Status: AC
  Filled 2019-07-20: qty 10

## 2019-07-20 NOTE — Discharge Instructions (Addendum)
We have given you a shot of antibiotics I want you to start the Bactrim first dose tonight if you are able to receive otherwise start this tomorrow morning.  If you are feeling worse later this evening or tomorrow would like for you to report to the emergency department for further evaluation.  Specifically if you have worsening low back pain, high fever, nausea with vomiting does not respond to medication, shortness of breath or chest pain.  We have sent a urine culture and will let you know if there needs to be any needs changes to your treatment.  If your Covid-19 test is positive, you will receive a phone call from [facility] regarding your results. Negative test results are not called. Both positive and negative results area always visible on MyChart. If you do not have a MyChart account, sign up instructions are in your discharge papers.   Persons who are directed to care for themselves at home may discontinue isolation under the following conditions:   At least 10 days have passed since symptom onset and  At least 24 hours have passed without running a fever (this means without the use of fever-reducing medications) and  Other symptoms have improved.  Persons infected with COVID-19 who never develop symptoms may discontinue isolation and other precautions 10 days after the date of their first positive COVID-19 test.

## 2019-07-20 NOTE — ED Provider Notes (Signed)
[place]    CSN: [id number] Arrival date & time: 07/20/19  1802      History   Chief Complaint Chief Complaint  Patient presents with  . Hematuria    HPI Jonathan [name] is a 64 y.o. male.   Patient with history of prostate cancer and history of prostatectomy(2016)  reports to urgent care for 3 days of painful urination and blood in his urine.  He reports starting Tuesday night when he woke up he had burning pain at the end of his urination with blood in his urine.  He also reports feeling that he has urinated a little more frequently.  He has continue to endorse pain at the end of urination with blood in his urine since that episode.  He has also had chills and body aches but no fever.  He has had nausea but no vomiting.  He is also reporting low back pain.  Rates the low back pain is 7/10.  Pain does not radiate.  He does not have a history of kidney stones.  He denies penile discharge or penile pain.  Denies pain with defecation or any rectal pain.  He does report that since the time of his prostatectomy he has had issues with frequent urination however has not had issues with painful urination or blood in his urine.  He is also been evaluated for lower abdominal pain over the last few months and was scheduled to have CT scan of his abdomen pelvis.  Overall patient reports that he does not feel that poorly is been tolerating liquids well.  However he was recommended to come in for evaluation as neighbor of his is a Marine scientist and took his blood pressure felt that he had lower blood pressures with a systolic number being closer to 100.  Patient reports his systolic blood pressures are normally in the 120s.  He does report he takes blood pressure medicine.  He denies cold-like or upper respiratory symptoms.  Denies chest pain or shortness of breath.  Patient reports he is not on home oxygen but uses CPAP at night.     Past Medical History:  Diagnosis Date  . Anxiety   .  Arthritis   . Asthma   . BPH (benign prostatic hyperplasia)   . Chest pain   . ED (erectile dysfunction)   . Elevated PSA    9.33  . Esophageal reflux   . Hearing loss   . History of hip replacement   . History of knee replacement   . Hypertension   . Joint pain   . Lactose intolerance   . Numbness in both legs   . Pre-diabetes   . Prostate cancer [facility]) Jan 2016   prostatectomy  . Sleep apnea   . Vitamin D deficiency     Patient Active Problem List   Diagnosis Date Noted  . Vitamin D deficiency 01/08/2019  . OSA on CPAP 09/12/2018  . Class 3 severe obesity with serious comorbidity and body mass index (BMI) of 45.0 to 49.9 in adult [facility]) 09/12/2018  . Chronic pulmonary hypertension ([place]) - noted on Echo 11/09/2017  . Morbid obesity ([place]) 11/02/2017  . Bradycardia 11/02/2017  . Hyperlipidemia due to dietary fat intake 09/11/2017  . Orthopnea 09/08/2017  . DOE (dyspnea on exertion) 09/08/2017  . Chest pain with moderate risk for cardiac etiology 09/08/2017  . Malignant neoplasm of prostate ([place]) 04/16/2014  . Weight gain 04/10/2014  . Cephalalgia 04/10/2014  . Back pain without  radiation 01/22/2014  . Prostate CA ([place]) 07/26/2012  . Anemia 12/15/2011  . ALLERGIC RHINITIS 04/17/2009  . ERECTILE DYSFUNCTION 01/19/2008  . ANXIETY 07/24/2007  . Essential hypertension 12/09/2006    Past Surgical History:  Procedure Laterality Date  . CORONARY CALCIUM SCORE / CARDIAC CT ANGIOGRAM  10/24/2017   Cor Ca Score = 0 - Low risk. Cor CTA: only mild plaque noted in mRCA with mLAD intramyocardial bridging noted.  [name] FOOT SURGERY     bilateral foot  . NM MYOVIEW LTD  07/2007   Normal nuclear stress test.  Mild fixed defect in the anterior wall likely related to soft tissue attenuation.  Normal EF.  Fair exercise capacity.  [name] PROSTATE BIOPSY  06/13/2012   right lat mid gleason 3+3=6  . PROSTATECTOMY  2016  . TOTAL HIP ARTHROPLASTY  2008 &2007   Right and left       Home  Medications    Prior to Admission medications   Medication Sig Start Date End Date Taking? Authorizing Provider  Ascorbic Acid (VITAMIN C) 1000 MG tablet Take 1,000 mg by mouth daily.    [provider]  atenolol-chlorthalidone (TENORETIC) 50-25 MG tablet TAKE 1 TABLET BY MOUTH EVERY DAY 05/01/19   Nafziger, Tommi Rumps, NP  Cholecalciferol (VITAMIN D3) 250 MCG (10000 UT) TABS Take by mouth.    [provider]  Echinacea 500 MG CAPS Take by mouth.    [provider]  ibuprofen (ADVIL) 200 MG tablet Take 200 mg by mouth every 6 (six) hours as needed.    [provider]  ondansetron (ZOFRAN) 4 MG tablet Take 1 tablet (4 mg total) by mouth every 8 (eight) hours as needed for nausea or vomiting. 07/20/19   Jonathan [name], Jonathan Beards, PA-C  potassium chloride (KLOR-CON) 10 MEQ tablet Take 1 tablet (10 mEq total) by mouth daily. 06/25/19   [name] A, DO  sulfamethoxazole-trimethoprim (BACTRIM DS) 800-160 MG tablet Take 1 tablet by mouth 2 (two) times daily for 7 days. 07/20/19 07/27/19  Jonathan [name], Jonathan Beards, PA-C  Vitamin D, Ergocalciferol, (DRISDOL) 1.25 MG (50000 UNIT) CAPS capsule Take 1 capsule (50,000 Units total) by mouth every 7 (seven) days. 06/25/19   [name] A, DO  zinc gluconate 50 MG tablet Take 50 mg by mouth daily.    [provider]    Family History Family History  Problem Relation Age of Onset  . Hypertension Mother   . Colon cancer Mother   . Obesity Mother   . Prostate cancer Father 83       alive now age 53  . Asthma Father   . Heart disease Father        Pt is not sure of details (no MI)  . Alcoholism Father   . Obesity Father   . Lung cancer Maternal Grandmother        non-smoker  . Tuberculosis Brother     Social History Social History   Tobacco Use  . Smoking status: Never Smoker  . Smokeless tobacco: Never Used  Substance Use Topics  . Alcohol use: No  . Drug use: No     Allergies   Codeine and Cozaar [losartan potassium]    Review of Systems Review of Systems  Constitutional: Positive for chills. Negative for fever.  HENT: Negative.   Respiratory: Negative for cough and shortness of breath.   Cardiovascular: Negative for chest pain.  Gastrointestinal: Positive for abdominal pain and nausea. Negative for constipation, diarrhea and vomiting.  Genitourinary:  Positive for dysuria, frequency and hematuria. Negative for decreased urine volume, difficulty urinating, discharge, flank pain, penile pain, penile swelling, scrotal swelling, testicular pain and urgency.  Musculoskeletal: Positive for arthralgias, back pain and myalgias.  Skin: Negative for color change and rash.  Neurological: Negative.      Physical Exam Triage Vital Signs ED Triage Vitals [07/20/19 1901]  Enc Vitals Group     BP 112/71     Pulse Rate 86     Resp 18     Temp 98.2 F (36.8 C)     Temp Source Oral     SpO2 90 %     Weight 280 lb (127 kg)     Height 5\' 11"  (1.803 m)     Head Circumference      Peak Flow      Pain Score 7     Pain Loc      Pain Edu?      Excl. in [place]?    No data found.  Updated Vital Signs BP 112/71   Pulse 86   Temp 98.2 F (36.8 C) (Oral)   Resp 18   Ht 5\' 11"  (1.803 m)   Wt 280 lb (127 kg)   SpO2 90%   BMI 39.05 kg/m   Repeat SPO2 at 94 to 95% on room air  Visual Acuity Right Eye Distance:   Left Eye Distance:   Bilateral Distance:    Right Eye Near:   Left Eye Near:    Bilateral Near:     Physical Exam Vitals and nursing note reviewed.  Constitutional:      Appearance: Normal appearance. He is well-developed. He is not ill-appearing.  HENT:     Head: Normocephalic and atraumatic.  Eyes:     Conjunctiva/sclera: Conjunctivae normal.  Cardiovascular:     Rate and Rhythm: Normal rate and regular rhythm.     Heart sounds: No murmur.  Pulmonary:     Effort: Pulmonary effort is normal. No respiratory distress.     Breath sounds: Normal breath sounds. No wheezing, rhonchi or rales.   Abdominal:     Palpations: Abdomen is soft.     Tenderness: There is abdominal tenderness (mild suprapubic ). There is no right CVA tenderness or left CVA tenderness.  Musculoskeletal:     Cervical back: Neck supple.  Skin:    General: Skin is warm and dry.  Neurological:     General: No focal deficit present.     Mental Status: He is alert and oriented to person, place, and time.      UC Treatments / Results  Labs (all labs ordered are listed, but only abnormal results are displayed) Labs Reviewed  POCT URINALYSIS DIP (DEVICE) - Abnormal; Notable for the following components:      Result Value   Hgb urine dipstick MODERATE (*)    Protein, ur 100 (*)    Leukocytes,Ua SMALL (*)    All other components within normal limits  URINE CULTURE  SARS CORONAVIRUS 2 (TAT 6-24 HRS)    EKG   Radiology No results found.  Procedures Procedures (including critical care time)  Medications Ordered in UC Medications  cefTRIAXone (ROCEPHIN) injection 1 g (1 g Intramuscular Given 07/20/19 1939)    Initial Impression / Assessment and Plan / UC Course  I have reviewed the triage vital signs and the nursing notes.  Pertinent labs & imaging results that were available during my care of the patient were reviewed by me and considered  in my medical decision making (see chart for details).     #Urinary tract infection Patient is a 64 year old male presenting with lower urinary symptoms with hematuria and associated nausea and back pain.  UA not grossly positive for infection however given associated symptoms and history will initiate antibiotic therapy at this time.  Though exam is not consistent with pyelonephritis at this time given any infection in this patient would be considered complicated, we will give 1 g Rocephin in clinic and follow on Bactrim therapy.  Urine culture was sent.  Patient had initial 90% SPO2 reading however on reevaluation he saturated up to 96% with consistently 94 to  95%, however does not have a history of requiring oxygen and recent SPO2 at their office visits was as high as 98%.  Patient is not reporting shortness of breath and has a benign lung exam.  No major history of pulmonary disease with exception of pulmonary hypertension.  Patient is afebrile and nontachycardic blood pressure within close range of his normal baseline, so doubt oxygen saturations a systemic response to infection at this time.   Discussed this finding with patient that he should monitor his breathing your to have increasing shortness of breath that he should report back to urgent care or to the emergency department.  Given this finding recommended that the patient be Covid tested.  Patient verbalizes agreement and understanding of the plan. -We will follow up with patient pending urine cultures likely recommend follow-up with urology if culture is completely normal.   Final Clinical Impressions(s) / UC Diagnoses   Final diagnoses:  Urinary tract infection with hematuria, site unspecified     Discharge Instructions     We have given you a shot of antibiotics I want you to start the Bactrim first dose tonight if you are able to receive otherwise start this tomorrow morning.  If you are feeling worse later this evening or tomorrow would like for you to report to the emergency department for further evaluation.  Specifically if you have worsening low back pain, high fever, nausea with vomiting does not respond to medication, shortness of breath or chest pain.  We have sent a urine culture and will let you know if there needs to be any needs changes to your treatment.  If your Covid-19 test is positive, you will receive a phone call from [facility] regarding your results. Negative test results are not called. Both positive and negative results area always visible on MyChart. If you do not have a MyChart account, sign up instructions are in your discharge papers.   Persons who are  directed to care for themselves at home may discontinue isolation under the following conditions:  . At least 10 days have passed since symptom onset and . At least 24 hours have passed without running a fever (this means without the use of fever-reducing medications) and . Other symptoms have improved.  Persons infected with COVID-19 who never develop symptoms may discontinue isolation and other precautions 10 days after the date of their first positive COVID-19 test.      ED Prescriptions    Medication Sig Dispense Auth. Provider   sulfamethoxazole-trimethoprim (BACTRIM DS) 800-160 MG tablet Take 1 tablet by mouth 2 (two) times daily for 7 days. 14 tablet Sharah Finnell, Jonathan Beards, PA-C   ondansetron (ZOFRAN) 4 MG tablet Take 1 tablet (4 mg total) by mouth every 8 (eight) hours as needed for nausea or vomiting. 6 tablet Castor Gittleman, Jonathan Beards, PA-C  PDMP not reviewed this encounter.   Purnell Shoemaker, PA-C 07/21/19 [phone]

## 2019-07-20 NOTE — ED Triage Notes (Signed)
Pt c/o frequency, dysuria, hematuriax3 days. Pt c/o 7/10 lower back pain.

## 2019-07-22 LAB — SARS CORONAVIRUS 2 (TAT 6-24 HRS): SARS Coronavirus 2: NEGATIVE

## 2019-07-23 LAB — URINE CULTURE: Culture: >=100000 — AB

## 2019-07-25 ENCOUNTER — Other Ambulatory Visit: Payer: Self-pay

## 2019-07-25 ENCOUNTER — Ambulatory Visit (INDEPENDENT_AMBULATORY_CARE_PROVIDER_SITE_OTHER): Payer: Medicare Other | Admitting: Family Medicine

## 2019-07-25 VITALS — BP 115/70 | HR 44 | Temp 97.7°F | Ht 71.0 in | Wt 283.0 lb

## 2019-07-25 DIAGNOSIS — E876 Hypokalemia: Secondary | ICD-10-CM

## 2019-07-25 DIAGNOSIS — R001 Bradycardia, unspecified: Secondary | ICD-10-CM

## 2019-07-25 DIAGNOSIS — E559 Vitamin D deficiency, unspecified: Secondary | ICD-10-CM | POA: Diagnosis not present

## 2019-07-25 DIAGNOSIS — Z6839 Body mass index (BMI) 39.0-39.9, adult: Secondary | ICD-10-CM | POA: Diagnosis not present

## 2019-07-25 MED ORDER — POTASSIUM CHLORIDE ER 10 MEQ PO TBCR: 10.0000 meq | EXTENDED_RELEASE_TABLET | Freq: Every day | ORAL | 0 refills | Status: DC

## 2019-07-25 MED ORDER — VITAMIN D (ERGOCALCIFEROL) 1.25 MG (50000 UNIT) PO CAPS: 50000.0000 [IU] | ORAL_CAPSULE | ORAL | 0 refills | Status: DC

## 2019-07-25 NOTE — Progress Notes (Signed)
Chief Complaint:   OBESITY Jonathan [name] is here to discuss his progress with his obesity treatment plan along with follow-up of his obesity related diagnoses. Jonathan [name] is on the Category 3 Plan and states he is following his eating plan approximately 0% of the time. Jonathan [name] states he is doing 0 minutes 0 times per week.  Today's visit was #: 18 Starting weight: 319 lbs Starting date: 08/28/2018 Today's weight: 283 lbs Today's date: 07/25/2019 Total lbs lost to date: 36 Total lbs lost since last in-office visit: 3  Interim History: Jonathan [name] was diagnosed with a UTI on 07/20/2019 and was given Bactrim. He had minimal appetite while ill. He has been trying to eat soft food but most are slightly high in carbohydrates.  Subjective:   1. Jonathan [name] Jonathan [name] pulse is consistently in the 40's. He voices he was on other medications for hypertension previously, but his blood pressure was not controlled.  2. [name] last potassium level was 3.4. He is KCI 10 meq daily.  3. [name] denies nausea, vomiting, or muscle weakness, but he notes fatigue. He is on prescription Vit D.  Assessment/Plan:   1. Jonathan [name] We will follow up on Jonathan [name] heart rate at his next appointment; may want to consider transitioning to different medications.  2. [name] will refill KCI 10 meq for 1 month. We will recheck labs at his next appointment.  - potassium chloride (KLOR-CON) 10 MEQ tablet; Take 1 tablet (10 mEq total) by mouth daily.  Dispense: 30 tablet; Refill: 0  3. Vitamin D deficiency Low Vitamin D level contributes to fatigue and are associated with obesity, breast, and colon cancer. We will refill prescription Vitamin D for 1 month. Jonathan [name] will follow-up for routine testing of Vitamin D, at least 2-3 times per year to avoid over-replacement. We will recheck labs at his next appointment.  - Vitamin D, Ergocalciferol, (DRISDOL) 1.25 MG (50000 UNIT) CAPS capsule; Take  1 capsule (50,000 Units total) by mouth every 7 (seven) days.  Dispense: 4 capsule; Refill: 0  4. Class 2 severe obesity with serious comorbidity and body mass index (BMI) of 39.0 to 39.9 in adult, unspecified obesity type (HCC) Jonathan [name] is currently in the action stage of change. As such, his goal is to continue with weight loss efforts. He has agreed to the Category 3 Plan.   Exercise goals: No exercise has been prescribed at this time.  Behavioral modification strategies: increasing lean protein intake, increasing vegetables, meal planning and cooking strategies and planning for success.  Jonathan [name] has agreed to follow-up with our clinic in 2 weeks. He was informed of the importance of frequent follow-up visits to maximize his success with intensive lifestyle modifications for his multiple health conditions.   Objective:   Blood pressure 115/70, pulse (!) 44, temperature 97.7 F (36.5 C), temperature source Oral, height 5\' 11"  (1.803 m), weight 283 lb (128.4 kg), SpO2 97 %. Body mass index is 39.47 kg/m.  General: Cooperative, alert, well developed, in no acute distress. HEENT: Conjunctivae and lids unremarkable. Cardiovascular: Regular rhythm.  Lungs: Normal work of breathing. Neurologic: No focal deficits.   Lab Results  Component Value Date   CREATININE 0.95 05/09/2019   BUN 20 05/09/2019   NA 135 05/09/2019   K 3.4 (L) 05/09/2019   CL 92 (L) 05/09/2019   CO2 36 (H) 05/09/2019   Lab Results  Component Value Date   ALT 14 05/09/2019   AST 16 05/09/2019   ALKPHOS 55 05/09/2019  BILITOT 0.5 05/09/2019   Lab Results  Component Value Date   HGBA1C 5.9 05/09/2019   HGBA1C 6.1 (H) 01/04/2019   HGBA1C 6.5 (H) 08/28/2018   HGBA1C 6.5 04/06/2018   HGBA1C 6.0 04/02/2016   Lab Results  Component Value Date   INSULIN 46.3 (H) 08/28/2018   Lab Results  Component Value Date   TSH 3.71 05/09/2019   Lab Results  Component Value Date   CHOL 167 05/09/2019   HDL 43.50  05/09/2019   LDLCALC 101 (H) 05/09/2019   TRIG 112.0 05/09/2019   CHOLHDL 4 05/09/2019   Lab Results  Component Value Date   WBC 11.2 (H) 05/09/2019   HGB 13.5 05/09/2019   HCT 41.6 05/09/2019   MCV 81.5 05/09/2019   PLT 282.0 05/09/2019   Lab Results  Component Value Date   IRON 150 12/15/2011   FERRITIN 9.2 (L) 12/15/2011    Obesity Behavioral Intervention Documentation for Insurance:   Approximately 15 minutes were spent on the discussion below.  ASK: We discussed the diagnosis of obesity with Jonathan [name] today and Jonathan [name] agreed to give [name] permission to discuss obesity behavioral modification therapy today.  ASSESS: Jonathan [name] has the diagnosis of obesity and his BMI today is 39.49. Jonathan [name] is in the action stage of change.   ADVISE: Jonathan [name] was educated on the multiple health risks of obesity as well as the benefit of weight loss to improve his health. He was advised of the need for long term treatment and the importance of lifestyle modifications to improve his current health and to decrease his risk of future health problems.  AGREE: Multiple dietary modification options and treatment options were discussed and Jonathan [name] agreed to follow the recommendations documented in the above note.  ARRANGE: Jonathan [name] was educated on the importance of frequent visits to treat obesity as outlined per CMS and USPSTF guidelines and agreed to schedule his next follow up appointment today.  Attestation Statements:   Reviewed by clinician on day of visit: allergies, medications, problem list, medical history, surgical history, family history, social history, and previous encounter notes.   I, Trixie Dredge, am acting as transcriptionist for April Manson, MD. I have reviewed the above documentation for accuracy and completeness, and I agree with the above. - Ilene Qua, MD

## 2019-07-27 DIAGNOSIS — R300 Dysuria: Secondary | ICD-10-CM | POA: Diagnosis not present

## 2019-08-01 ENCOUNTER — Ambulatory Visit
Admission: RE | Admit: 2019-08-01 | Discharge: 2019-08-01 | Disposition: A | Discharge status: Home / Self Care | Payer: Medicare Other | Source: Ambulatory Visit | Attending: Gastroenterology | Admitting: Gastroenterology

## 2019-08-01 DIAGNOSIS — K5730 Diverticulosis of large intestine without perforation or abscess without bleeding: Secondary | ICD-10-CM | POA: Diagnosis not present

## 2019-08-01 MED ORDER — IOPAMIDOL (ISOVUE-300) INJECTION 61%
125.0000 mL | Freq: Once | INTRAVENOUS | Status: AC | PRN
  Administered 2019-08-01: 125 mL via INTRAVENOUS

## 2019-08-13 DIAGNOSIS — G4733 Obstructive sleep apnea (adult) (pediatric): Secondary | ICD-10-CM | POA: Diagnosis not present

## 2019-08-14 ENCOUNTER — Telehealth: Payer: Self-pay | Admitting: Adult Health

## 2019-08-14 ENCOUNTER — Ambulatory Visit (INDEPENDENT_AMBULATORY_CARE_PROVIDER_SITE_OTHER): Payer: Medicare Other | Admitting: Bariatrics

## 2019-08-14 ENCOUNTER — Other Ambulatory Visit: Payer: Self-pay

## 2019-08-14 ENCOUNTER — Other Ambulatory Visit: Payer: Self-pay | Admitting: Adult Health

## 2019-08-14 ENCOUNTER — Other Ambulatory Visit: Payer: Self-pay | Admitting: Gastroenterology

## 2019-08-14 ENCOUNTER — Encounter (INDEPENDENT_AMBULATORY_CARE_PROVIDER_SITE_OTHER): Payer: Self-pay | Admitting: Bariatrics

## 2019-08-14 VITALS — BP 125/61 | HR 50 | Temp 98.0°F | Ht 71.0 in | Wt 283.0 lb

## 2019-08-14 DIAGNOSIS — R1010 Upper abdominal pain, unspecified: Secondary | ICD-10-CM

## 2019-08-14 DIAGNOSIS — I10 Essential (primary) hypertension: Secondary | ICD-10-CM

## 2019-08-14 DIAGNOSIS — E559 Vitamin D deficiency, unspecified: Secondary | ICD-10-CM

## 2019-08-14 DIAGNOSIS — Z6839 Body mass index (BMI) 39.0-39.9, adult: Secondary | ICD-10-CM

## 2019-08-14 MED ORDER — SCOPOLAMINE 1 MG/3DAYS TD PT72: 1.0000 | MEDICATED_PATCH | TRANSDERMAL | 0 refills | Status: DC

## 2019-08-14 MED ORDER — VITAMIN D (ERGOCALCIFEROL) 1.25 MG (50000 UNIT) PO CAPS: 50000.0000 [IU] | ORAL_CAPSULE | ORAL | 0 refills | Status: DC

## 2019-08-14 NOTE — Telephone Encounter (Signed)
Order for Scopolamine Patch  sent to pharmacy

## 2019-08-14 NOTE — Telephone Encounter (Signed)
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 He was told by the instructor for first time users they need to get it from their PCP.  Pharmacy: [facility] [address] FAX: [phone]   Pt can be reached at [phone]

## 2019-08-14 NOTE — Telephone Encounter (Signed)
SENT TO THE PHARMACY BY E-SCRIBE. LEFT A MESSAGE INFORMING THE PT.  NOTHING FURTHER NEEDED.

## 2019-08-14 NOTE — Progress Notes (Signed)
Chief Complaint:   OBESITY Jonathan [name] is here to discuss his progress with his obesity treatment plan along with follow-up of his obesity related diagnoses. [name] is on the Category 3 Plan and states he is following his eating plan approximately 50% of the time. [name] states he is doing cardio/weights 60 minutes 3 times per week.  Today's visit was #: 86 Starting weight: 319 lbs Starting date: 08/28/2018 Today's weight: 283 lbs Today's date: 08/14/2019 Total lbs lost to date: 36 Total lbs lost since last in-office visit: 0  Interim History: Jonathan [name]'s weight remains the same, but he has done well overall.  Subjective:   Vitamin D deficiency. No nausea, vomiting, or muscle weakness. Last Vitamin D 35.21 on 05/09/2019.  Essential hypertension. Jehovah is taking Tenoretic. Blood pressure is under good control.  BP Readings from Last 3 Encounters:  08/14/19 125/61  07/25/19 115/70  07/20/19 112/71   Lab Results  Component Value Date   CREATININE 0.95 05/09/2019   CREATININE 0.99 01/04/2019   CREATININE 0.86 08/28/2018   Assessment/Plan:   Vitamin D deficiency. Low Vitamin D level contributes to fatigue and are associated with obesity, breast, and colon cancer. He was given a prescription for Vitamin D, Ergocalciferol, (DRISDOL) 1.25 MG (50000 UNIT) CAPS capsule every week #4 with 0 refills and will follow-up for routine testing of Vitamin D, at least 2-3 times per year to avoid over-replacement.     Essential hypertension. [name] is working on healthy weight loss and exercise to improve blood pressure control. We will watch for signs of hypotension as he continues his lifestyle modifications. He will continue his medication as directed.  Class 2 severe obesity with serious comorbidity and body mass index (BMI) of 39.0 to 39.9 in adult, unspecified obesity type ([place]).  [name] is currently in the action stage of change. As such, his goal is to continue with weight loss  efforts. He has agreed to the Category 3 Plan.   He will work on meal planning and intentional eating.  Exercise goals: All adults should avoid inactivity. Some physical activity is better than none, and adults who participate in any amount of physical activity gain some health benefits.  Behavioral modification strategies: increasing lean protein intake, decreasing simple carbohydrates, increasing vegetables, increasing water intake, decreasing eating out, no skipping meals, meal planning and cooking strategies, keeping healthy foods in the home and planning for success.  [name] has agreed to follow-up with our clinic in 2 weeks. He was informed of the importance of frequent follow-up visits to maximize his success with intensive lifestyle modifications for his multiple health conditions.   Objective:   Blood pressure 125/61, pulse (!) 50, temperature 98 F (36.7 C), height 5\' 11"  (1.803 m), weight 283 lb (128.4 kg), SpO2 97 %. Body mass index is 39.47 kg/m.  General: Cooperative, alert, well developed, in no acute distress. HEENT: Conjunctivae and lids unremarkable. Cardiovascular: Regular rhythm.  Lungs: Normal work of breathing. Neurologic: No focal deficits.   Lab Results  Component Value Date   CREATININE 0.95 05/09/2019   BUN 20 05/09/2019   NA 135 05/09/2019   K 3.4 (L) 05/09/2019   CL 92 (L) 05/09/2019   CO2 36 (H) 05/09/2019   Lab Results  Component Value Date   ALT 14 05/09/2019   AST 16 05/09/2019   ALKPHOS 55 05/09/2019   BILITOT 0.5 05/09/2019   Lab Results  Component Value Date   HGBA1C 5.9 05/09/2019   HGBA1C 6.1 (H)  01/04/2019   HGBA1C 6.5 (H) 08/28/2018   HGBA1C 6.5 04/06/2018   HGBA1C 6.0 04/02/2016   Lab Results  Component Value Date   INSULIN 46.3 (H) 08/28/2018   Lab Results  Component Value Date   TSH 3.71 05/09/2019   Lab Results  Component Value Date   CHOL 167 05/09/2019   HDL 43.50 05/09/2019   LDLCALC 101 (H) 05/09/2019   TRIG  112.0 05/09/2019   CHOLHDL 4 05/09/2019   Lab Results  Component Value Date   WBC 11.2 (H) 05/09/2019   HGB 13.5 05/09/2019   HCT 41.6 05/09/2019   MCV 81.5 05/09/2019   PLT 282.0 05/09/2019   Lab Results  Component Value Date   IRON 150 12/15/2011   FERRITIN 9.2 (L) 12/15/2011   Obesity Behavioral Intervention Documentation for Insurance:   Approximately 15 minutes were spent on the discussion below.  ASK: We discussed the diagnosis of obesity with Jonathan [name] today and [name] agreed to give [name] permission to discuss obesity behavioral modification therapy today.  ASSESS: Jonathan [name] has the diagnosis of obesity and his BMI today is 39.6. Jonathan [name] is in the action stage of change.   ADVISE: Jonathan [name] was educated on the multiple health risks of obesity as well as the benefit of weight loss to improve his health. He was advised of the need for long term treatment and the importance of lifestyle modifications to improve his current health and to decrease his risk of future health problems.  AGREE: Multiple dietary modification options and treatment options were discussed and Jonathan [name] agreed to follow the recommendations documented in the above note.  ARRANGE: Jonathan [name] was educated on the importance of frequent visits to treat obesity as outlined per CMS and USPSTF guidelines and agreed to schedule his next follow up appointment today.  Attestation Statements:   Reviewed by clinician on day of visit: allergies, medications, problem list, medical history, surgical history, family history, social history, and previous encounter notes.  [name], am acting as Location manager for [employer], DO   I have reviewed the above documentation for accuracy and completeness, and I agree with the above. [name], DO

## 2019-08-14 NOTE — Progress Notes (Signed)
Order for Scopolamine Patch

## 2019-08-16 ENCOUNTER — Ambulatory Visit
Admission: RE | Admit: 2019-08-16 | Discharge: 2019-08-16 | Disposition: A | Discharge status: Home / Self Care | Payer: Medicare Other | Source: Ambulatory Visit | Attending: Gastroenterology | Admitting: Gastroenterology

## 2019-08-16 DIAGNOSIS — K7689 Other specified diseases of liver: Secondary | ICD-10-CM | POA: Diagnosis not present

## 2019-08-16 DIAGNOSIS — R1010 Upper abdominal pain, unspecified: Secondary | ICD-10-CM

## 2019-08-19 ENCOUNTER — Other Ambulatory Visit (INDEPENDENT_AMBULATORY_CARE_PROVIDER_SITE_OTHER): Payer: Self-pay | Admitting: Family Medicine

## 2019-08-19 DIAGNOSIS — E559 Vitamin D deficiency, unspecified: Secondary | ICD-10-CM

## 2019-08-21 ENCOUNTER — Other Ambulatory Visit (INDEPENDENT_AMBULATORY_CARE_PROVIDER_SITE_OTHER): Payer: Self-pay | Admitting: Family Medicine

## 2019-08-21 DIAGNOSIS — E876 Hypokalemia: Secondary | ICD-10-CM

## 2019-08-28 ENCOUNTER — Ambulatory Visit: Payer: Medicare Other | Admitting: Cardiology

## 2019-08-30 ENCOUNTER — Ambulatory Visit (INDEPENDENT_AMBULATORY_CARE_PROVIDER_SITE_OTHER): Payer: Medicare Other | Admitting: Bariatrics

## 2019-08-30 ENCOUNTER — Other Ambulatory Visit: Payer: Self-pay

## 2019-08-30 ENCOUNTER — Encounter (INDEPENDENT_AMBULATORY_CARE_PROVIDER_SITE_OTHER): Payer: Self-pay | Admitting: Bariatrics

## 2019-08-30 VITALS — BP 126/71 | HR 51 | Temp 97.8°F | Ht 71.0 in | Wt 283.0 lb

## 2019-08-30 DIAGNOSIS — Z6839 Body mass index (BMI) 39.0-39.9, adult: Secondary | ICD-10-CM | POA: Diagnosis not present

## 2019-08-30 DIAGNOSIS — R7303 Prediabetes: Secondary | ICD-10-CM

## 2019-08-30 DIAGNOSIS — Z86010 Personal history of colonic polyps: Secondary | ICD-10-CM | POA: Diagnosis not present

## 2019-08-30 DIAGNOSIS — R109 Unspecified abdominal pain: Secondary | ICD-10-CM | POA: Diagnosis not present

## 2019-08-30 DIAGNOSIS — I10 Essential (primary) hypertension: Secondary | ICD-10-CM | POA: Diagnosis not present

## 2019-09-03 ENCOUNTER — Encounter (INDEPENDENT_AMBULATORY_CARE_PROVIDER_SITE_OTHER): Payer: Self-pay | Admitting: Bariatrics

## 2019-09-03 NOTE — Progress Notes (Signed)
Chief Complaint:   Jonathan [name] is here to discuss his progress with his Jonathan treatment plan along with follow-up of his Jonathan related diagnoses. Jonathan [name] is Jonathan the Category 3 Plan and states he is following his eating plan approximately 95% of the time. Jonathan [name] states he is doing cardio 60 minutes 3 times per week.  Today's visit was #: 20 Starting weight: 319 lbs Starting date: 08/28/2018 Today's weight: 283 lbs Today's date: 08/30/2019 Total lbs lost to date: 36 Total lbs lost since last in-office visit: 0  Interim History: Jonathan [name]'s weight remains the same. He states he has tried a lot of new things.  Subjective:   Essential hypertension. Blood pressure is controlled.  BP Readings from Last 3 Encounters:  08/30/19 126/71  08/14/19 125/61  07/25/19 115/70   Lab Results  Component Value Date   CREATININE 0.95 05/09/2019   CREATININE 0.99 01/04/2019   CREATININE 0.86 08/28/2018   Prediabetes. Jonathan [name] has a diagnosis of prediabetes based Jonathan his elevated HgA1c and was informed this puts him at greater risk of developing diabetes. He continues to work Jonathan diet and exercise to decrease his risk of diabetes. He denies nausea or hypoglycemia. Jonathan [name] is Jonathan no medication.  Lab Results  Component Value Date   HGBA1C 5.9 05/09/2019   Lab Results  Component Value Date   INSULIN 46.3 (H) 08/28/2018   Assessment/Plan:   Essential hypertension. Jonathan [name] is working Jonathan healthy weight loss and exercise to improve blood pressure control. We will watch for signs of hypotension as he continues his lifestyle modifications. He will continue his medications as directed.  Prediabetes. Jonathan [name] will continue to work Jonathan weight loss, exercise, increasing healthy fats and protein, and decreasing simple carbohydrates to help decrease the risk of diabetes.   Class 2 severe Jonathan with serious comorbidity and body mass index (BMI) of 39.0 to 39.9 in adult, unspecified Jonathan type  (Jonathan [name]).  Jonathan [name] is currently in the action stage of change. As such, his goal is to continue with weight loss efforts. He has agreed to following a lower carbohydrate, vegetable and lean protein rich diet plan.   He will work Jonathan meal planning, intentional eating, and increasing his water intake, Power Ade or Gatorade Zero.  Exercise goals: Jonathan [name] will increase his cardio for 1 hour.  Behavioral modification strategies: increasing lean protein intake, decreasing simple carbohydrates, increasing vegetables, increasing water intake, decreasing eating out, no skipping meals, meal planning and cooking strategies and keeping healthy foods in the home.  Jonathan [name] has agreed to follow-up with our clinic in 4 weeks. He was informed of the importance of frequent follow-up visits to maximize his success with intensive lifestyle modifications for his multiple health conditions.   Objective:   Blood pressure 126/71, pulse (!) 51, temperature 97.8 F (36.6 C), height 5\' 11"  (1.803 m), weight 283 lb (128.4 kg), SpO2 99 %. Body mass index is 39.47 kg/m.  General: Cooperative, alert, well developed, in no acute distress. HEENT: Conjunctivae and lids unremarkable. Cardiovascular: Regular rhythm.  Lungs: Normal work of breathing. Neurologic: No focal deficits.   Lab Results  Component Value Date   CREATININE 0.95 05/09/2019   BUN 20 05/09/2019   NA 135 05/09/2019   K 3.4 (L) 05/09/2019   CL 92 (L) 05/09/2019   CO2 36 (H) 05/09/2019   Lab Results  Component Value Date   ALT 14 05/09/2019   AST 16 05/09/2019   ALKPHOS 55 05/09/2019   BILITOT 0.5 05/09/2019  Lab Results  Component Value Date   HGBA1C 5.9 05/09/2019   HGBA1C 6.1 (H) 01/04/2019   HGBA1C 6.5 (H) 08/28/2018   HGBA1C 6.5 04/06/2018   HGBA1C 6.0 04/02/2016   Lab Results  Component Value Date   INSULIN 46.3 (H) 08/28/2018   Lab Results  Component Value Date   TSH 3.71 05/09/2019   Lab Results  Component Value Date   CHOL  167 05/09/2019   HDL 43.50 05/09/2019   LDLCALC 101 (H) 05/09/2019   TRIG 112.0 05/09/2019   CHOLHDL 4 05/09/2019   Lab Results  Component Value Date   WBC 11.2 (H) 05/09/2019   HGB 13.5 05/09/2019   HCT 41.6 05/09/2019   MCV 81.5 05/09/2019   PLT 282.0 05/09/2019   Lab Results  Component Value Date   IRON 150 12/15/2011   FERRITIN 9.2 (L) 12/15/2011   Jonathan Behavioral Intervention Documentation for Insurance:   Approximately 15 minutes were spent Jonathan the discussion below.  ASK: We discussed the diagnosis of Jonathan with Jonathan [name] today and Jonathan [name] agreed to give [name] permission to discuss Jonathan behavioral modification therapy today.  ASSESS: Jonathan [name] has the diagnosis of Jonathan and his BMI today is 39.6. Jonathan [name] is in the action stage of change.   ADVISE: Jonathan [name] was educated Jonathan the multiple health risks of Jonathan as well as the benefit of weight loss to improve his health. He was advised of the need for long term treatment and the importance of lifestyle modifications to improve his current health and to decrease his risk of future health problems.  AGREE: Multiple dietary modification options and treatment options were discussed and Jonathan [name] agreed to follow the recommendations documented in the above note.  ARRANGE: Jonathan [name] was educated Jonathan the importance of frequent visits to treat Jonathan as outlined per CMS and USPSTF guidelines and agreed to schedule his next follow up appointment today.  Attestation Statements:   Reviewed by clinician Jonathan day of visit: allergies, medications, problem list, medical history, surgical history, family history, social history, and previous encounter notes.  [name], am acting as Location manager for [employer], DO   I have reviewed the above documentation for accuracy and completeness, and I agree with the above. [name], DO

## 2019-09-05 ENCOUNTER — Ambulatory Visit: Payer: Medicare Other | Attending: Internal Medicine

## 2019-09-05 DIAGNOSIS — Z23 Encounter for immunization: Secondary | ICD-10-CM

## 2019-09-05 NOTE — Progress Notes (Signed)
   Covid-19 Vaccination Clinic  Name:  Jonathan [name]    MRN: [id number] DOB: 1956-04-10  09/05/2019  Jonathan [name] was observed post Covid-19 immunization for 15 minutes without incident. He was provided with Vaccine Information Sheet and instruction to access the V-Safe system.   Jonathan [name] was instructed to call 911 with any severe reactions post vaccine: [name] Difficulty breathing  . Swelling of face and throat  . A fast heartbeat  . A bad rash all over body  . Dizziness and weakness   Immunizations Administered    Name Date Dose VIS Date Route   JANSSEN COVID-19 VACCINE 09/05/2019  8:43 AM 0.5 mL 06/09/2019 Intramuscular   Manufacturer: Alphonsa Overall   Lot: [id number]   [place]: [id number]

## 2019-09-14 ENCOUNTER — Other Ambulatory Visit (INDEPENDENT_AMBULATORY_CARE_PROVIDER_SITE_OTHER): Payer: Self-pay | Admitting: Bariatrics

## 2019-09-14 DIAGNOSIS — E559 Vitamin D deficiency, unspecified: Secondary | ICD-10-CM

## 2019-09-27 ENCOUNTER — Ambulatory Visit (INDEPENDENT_AMBULATORY_CARE_PROVIDER_SITE_OTHER): Payer: Medicare Other | Admitting: Bariatrics

## 2019-09-27 ENCOUNTER — Other Ambulatory Visit: Payer: Self-pay

## 2019-09-27 ENCOUNTER — Encounter (INDEPENDENT_AMBULATORY_CARE_PROVIDER_SITE_OTHER): Payer: Self-pay | Admitting: Bariatrics

## 2019-09-27 VITALS — BP 119/67 | HR 77 | Temp 97.7°F | Ht 71.0 in | Wt 278.0 lb

## 2019-09-27 DIAGNOSIS — R7303 Prediabetes: Secondary | ICD-10-CM

## 2019-09-27 DIAGNOSIS — I10 Essential (primary) hypertension: Secondary | ICD-10-CM | POA: Diagnosis not present

## 2019-09-27 DIAGNOSIS — Z6838 Body mass index (BMI) 38.0-38.9, adult: Secondary | ICD-10-CM

## 2019-09-27 DIAGNOSIS — E559 Vitamin D deficiency, unspecified: Secondary | ICD-10-CM | POA: Diagnosis not present

## 2019-09-27 MED ORDER — VITAMIN D (ERGOCALCIFEROL) 1.25 MG (50000 UNIT) PO CAPS: 50000.0000 [IU] | ORAL_CAPSULE | ORAL | 0 refills | Status: DC

## 2019-09-27 NOTE — Progress Notes (Signed)
Chief Complaint:   OBESITY Jonathan [name] is here to discuss his progress with his obesity treatment plan along with follow-up of his obesity related diagnoses. Collis is following a lower carbohydrate, vegetable and lean protein rich diet plan and states he is following his eating plan approximately 100% of the time. Scottie states he is doing cardio 45 minutes 3 times per week and weights 15 minutes 3 times per week.  Today's visit was #: 21 Starting weight: 319 lbs Starting date: 08/28/2018 Today's weight: 278 lbs Today's date: 09/27/2019 Total lbs lost to date: 41 Total lbs lost since last in-office visit: 5  Interim History: Sajjad is down 5 lbs and doing well overall. He is doing better with the low carbohydrate plan and not eating after 7:30 p.m.  Subjective:   Vitamin D deficiency. No nausea, vomiting, or muscle weakness. Last Vitamin D was 47.3 on 01/04/2019.  Essential hypertension. Albaro is taking Tenoretic.   BP Readings from Last 3 Encounters:  09/27/19 119/67  08/30/19 126/71  08/14/19 125/61   Lab Results  Component Value Date   CREATININE 0.95 05/09/2019   CREATININE 0.99 01/04/2019   CREATININE 0.86 08/28/2018   Prediabetes. Shondale has a diagnosis of prediabetes based on his elevated HgA1c and was informed this puts him at greater risk of developing diabetes. He continues to work on diet and exercise to decrease his risk of diabetes. He denies nausea or hypoglycemia.  Lab Results  Component Value Date   HGBA1C 5.9 05/09/2019   Lab Results  Component Value Date   INSULIN 46.3 (H) 08/28/2018   Assessment/Plan:   Vitamin D deficiency. Low Vitamin D level contributes to fatigue and are associated with obesity, breast, and colon cancer. He was given a prescription for Vitamin D, Ergocalciferol, (DRISDOL) 1.25 MG (50000 UNIT) CAPS capsule every week #4 with 0 refills and will follow-up for routine testing of Vitamin D, at least 2-3 times per year to  avoid over-replacement.  Essential hypertension. Marcello is working on healthy weight loss and exercise to improve blood pressure control. We will watch for signs of hypotension as he continues his lifestyle modifications. He will continue his medication as directed. Comprehensive metabolic panel, Lipid Panel With LDL/HDL Ratio labs ordered today.  Prediabetes. Carolyn will continue to work on weight loss, exercise, and decreasing simple carbohydrates to help decrease the risk of diabetes. Hemoglobin A1c, Insulin, random ordered today.  Class 2 severe obesity with serious comorbidity and body mass index (BMI) of 38.0 to 38.9 in adult, unspecified obesity type ([place]).  Zebulin is currently in the action stage of change. As such, his goal is to continue with weight loss efforts. He has agreed to following a lower carbohydrate, vegetable and lean protein rich diet plan.   He will work on meal planning and intentional eating.   Exercise goals: All adults should avoid inactivity. Some physical activity is better than none, and adults who participate in any amount of physical activity gain some health benefits.  Behavioral modification strategies: increasing lean protein intake, decreasing simple carbohydrates, increasing vegetables, increasing water intake, decreasing eating out, no skipping meals, meal planning and cooking strategies, keeping healthy foods in the home and planning for success.  Duron has agreed to follow-up with our clinic in 2 weeks. He was informed of the importance of frequent follow-up visits to maximize his success with intensive lifestyle modifications for his multiple health conditions.   Radley was informed we would discuss his lab results at  his next visit unless there is a critical issue that needs to be addressed sooner. Dejion agreed to keep his next visit at the agreed upon time to discuss these results.  Objective:   Blood pressure 119/67, pulse 77, temperature 97.7 F  (36.5 C), height 5\' 11"  (1.803 m), weight 278 lb (126.1 kg), SpO2 100 %. Body mass index is 38.77 kg/m.  General: Cooperative, alert, well developed, in no acute distress. HEENT: Conjunctivae and lids unremarkable. Cardiovascular: Regular rhythm.  Lungs: Normal work of breathing. Neurologic: No focal deficits.   Lab Results  Component Value Date   CREATININE 0.95 05/09/2019   BUN 20 05/09/2019   NA 135 05/09/2019   K 3.4 (L) 05/09/2019   CL 92 (L) 05/09/2019   CO2 36 (H) 05/09/2019   Lab Results  Component Value Date   ALT 14 05/09/2019   AST 16 05/09/2019   ALKPHOS 55 05/09/2019   BILITOT 0.5 05/09/2019   Lab Results  Component Value Date   HGBA1C 5.9 05/09/2019   HGBA1C 6.1 (H) 01/04/2019   HGBA1C 6.5 (H) 08/28/2018   HGBA1C 6.5 04/06/2018   HGBA1C 6.0 04/02/2016   Lab Results  Component Value Date   INSULIN 46.3 (H) 08/28/2018   Lab Results  Component Value Date   TSH 3.71 05/09/2019   Lab Results  Component Value Date   CHOL 167 05/09/2019   HDL 43.50 05/09/2019   LDLCALC 101 (H) 05/09/2019   TRIG 112.0 05/09/2019   CHOLHDL 4 05/09/2019   Lab Results  Component Value Date   WBC 11.2 (H) 05/09/2019   HGB 13.5 05/09/2019   HCT 41.6 05/09/2019   MCV 81.5 05/09/2019   PLT 282.0 05/09/2019   Lab Results  Component Value Date   IRON 150 12/15/2011   FERRITIN 9.2 (L) 12/15/2011   Obesity Behavioral Intervention Documentation for Insurance:   Approximately 15 minutes were spent on the discussion below.  ASK: We discussed the diagnosis of obesity with [name] today and Macaulay agreed to give [name] permission to discuss obesity behavioral modification therapy today.  ASSESS: Sahaj has the diagnosis of obesity and his BMI today is 38.8. Saunders is in the action stage of change.   ADVISE: Ash was educated on the multiple health risks of obesity as well as the benefit of weight loss to improve his health. He was advised of the need for long term  treatment and the importance of lifestyle modifications to improve his current health and to decrease his risk of future health problems.  AGREE: Multiple dietary modification options and treatment options were discussed and Marisol agreed to follow the recommendations documented in the above note.  ARRANGE: Kourtland was educated on the importance of frequent visits to treat obesity as outlined per CMS and USPSTF guidelines and agreed to schedule his next follow up appointment today.  Attestation Statements:   Reviewed by clinician on day of visit: allergies, medications, problem list, medical history, surgical history, family history, social history, and previous encounter notes.  [name], am acting as Location manager for [employer], DO   I have reviewed the above documentation for accuracy and completeness, and I agree with the above. [name], DO

## 2019-09-28 LAB — COMPREHENSIVE METABOLIC PANEL
ALT: 17 IU/L (ref 0–44)
AST: 28 IU/L (ref 0–40)
Albumin/Globulin Ratio: 1.2 (ref 1.2–2.2)
Albumin: 4.1 g/dL (ref 3.8–4.8)
Alkaline Phosphatase: 49 IU/L (ref 48–121)
BUN/Creatinine Ratio: 12 (ref 10–24)
BUN: 11 mg/dL (ref 8–27)
Bilirubin Total: 0.7 mg/dL (ref 0.0–1.2)
CO2: 30 mmol/L — ABNORMAL HIGH (ref 20–29)
Calcium: 9.7 mg/dL (ref 8.6–10.2)
Chloride: 94 mmol/L — ABNORMAL LOW (ref 96–106)
Creatinine, Ser: 0.91 mg/dL (ref 0.76–1.27)
GFR calc Af Amer: 104 mL/min/{1.73_m2} (ref >59)
GFR calc non Af Amer: 90 mL/min/{1.73_m2} (ref >59)
Globulin, Total: 3.5 g/dL (ref 1.5–4.5)
Glucose: 86 mg/dL (ref 65–99)
Potassium: 3.6 mmol/L (ref 3.5–5.2)
Sodium: 138 mmol/L (ref 134–144)
Total Protein: 7.6 g/dL (ref 6.0–8.5)

## 2019-09-28 LAB — VITAMIN D 25 HYDROXY (VIT D DEFICIENCY, FRACTURES): Vit D, 25-Hydroxy: 54.3 ng/mL (ref 30.0–100.0)

## 2019-09-28 LAB — LIPID PANEL WITH LDL/HDL RATIO
Cholesterol, Total: 160 mg/dL (ref 100–199)
HDL: 51 mg/dL (ref >39)
LDL Chol Calc (NIH): 97 mg/dL (ref 0–99)
LDL/HDL Ratio: 1.9 ratio (ref 0.0–3.6)
Triglycerides: 60 mg/dL (ref 0–149)
VLDL Cholesterol Cal: 12 mg/dL (ref 5–40)

## 2019-09-28 LAB — HEMOGLOBIN A1C
Est. average glucose Bld gHb Est-mCnc: 117 mg/dL
Hgb A1c MFr Bld: 5.7 % — ABNORMAL HIGH (ref 4.8–5.6)

## 2019-09-28 LAB — INSULIN, RANDOM: INSULIN: 11.2 u[IU]/mL (ref 2.6–24.9)

## 2019-10-01 ENCOUNTER — Encounter (INDEPENDENT_AMBULATORY_CARE_PROVIDER_SITE_OTHER): Payer: Self-pay | Admitting: Bariatrics

## 2019-10-11 ENCOUNTER — Ambulatory Visit (INDEPENDENT_AMBULATORY_CARE_PROVIDER_SITE_OTHER): Payer: Medicare Other | Admitting: Bariatrics

## 2019-10-11 ENCOUNTER — Encounter (INDEPENDENT_AMBULATORY_CARE_PROVIDER_SITE_OTHER): Payer: Self-pay | Admitting: Bariatrics

## 2019-10-11 ENCOUNTER — Other Ambulatory Visit: Payer: Self-pay

## 2019-10-11 ENCOUNTER — Ambulatory Visit: Payer: Medicare Other | Admitting: Cardiology

## 2019-10-11 ENCOUNTER — Encounter: Payer: Self-pay | Admitting: Cardiology

## 2019-10-11 VITALS — BP 140/86 | HR 49 | Temp 97.6°F | Ht 71.0 in | Wt 262.0 lb

## 2019-10-11 VITALS — BP 123/76 | HR 49 | Temp 98.4°F | Ht 71.0 in | Wt 278.0 lb

## 2019-10-11 DIAGNOSIS — I272 Pulmonary hypertension, unspecified: Secondary | ICD-10-CM | POA: Diagnosis not present

## 2019-10-11 DIAGNOSIS — I10 Essential (primary) hypertension: Secondary | ICD-10-CM

## 2019-10-11 DIAGNOSIS — R079 Chest pain, unspecified: Secondary | ICD-10-CM | POA: Diagnosis not present

## 2019-10-11 DIAGNOSIS — G4733 Obstructive sleep apnea (adult) (pediatric): Secondary | ICD-10-CM | POA: Diagnosis not present

## 2019-10-11 DIAGNOSIS — E559 Vitamin D deficiency, unspecified: Secondary | ICD-10-CM | POA: Diagnosis not present

## 2019-10-11 DIAGNOSIS — R0609 Other forms of dyspnea: Secondary | ICD-10-CM

## 2019-10-11 DIAGNOSIS — E876 Hypokalemia: Secondary | ICD-10-CM

## 2019-10-11 DIAGNOSIS — Z9189 Other specified personal risk factors, not elsewhere classified: Secondary | ICD-10-CM | POA: Diagnosis not present

## 2019-10-11 DIAGNOSIS — E7849 Other hyperlipidemia: Secondary | ICD-10-CM

## 2019-10-11 DIAGNOSIS — Z6838 Body mass index (BMI) 38.0-38.9, adult: Secondary | ICD-10-CM

## 2019-10-11 DIAGNOSIS — R0600 Dyspnea, unspecified: Secondary | ICD-10-CM | POA: Diagnosis not present

## 2019-10-11 DIAGNOSIS — Z9989 Dependence on other enabling machines and devices: Secondary | ICD-10-CM

## 2019-10-11 MED ORDER — VITAMIN D (ERGOCALCIFEROL) 1.25 MG (50000 UNIT) PO CAPS: ORAL_CAPSULE | ORAL | 0 refills | Status: DC

## 2019-10-11 MED ORDER — POTASSIUM CHLORIDE ER 10 MEQ PO TBCR: 10.0000 meq | EXTENDED_RELEASE_TABLET | Freq: Every day | ORAL | 0 refills | Status: DC

## 2019-10-11 NOTE — Progress Notes (Signed)
Chief Complaint:   OBESITY Jonathan [name] is here to discuss his progress with his obesity treatment plan along with follow-up of his obesity related diagnoses. Jonathan [name] is following a lower carbohydrate, vegetable and lean protein rich diet plan and states he is following his eating plan approximately 100% of the time. Jonathan [name] states he is doing cardio 60 minutes 4 times a week and weights 30 minutes 4 times per week.  Today's visit was #: 22 Starting weight: 319 lbs Starting date: 08/28/2018 Today's weight: 278 lbs Today's date: 10/11/2019 Total lbs lost to date: 41 Total lbs lost since last in-office visit: 0  Interim History: Jonathan [name]'s weight remains the same, but he has done well overall.  Subjective:   Hypokalemia. Jonathan [name] is taking atenolol-chlorthalidone for blood pressure. Potassium was 3.6 on 09/27/2019.  Vitamin D deficiency. No nausea, vomiting, or muscle weakness.    Ref. Range 09/27/2019 10:19  Vitamin D, 25-Hydroxy Latest Ref Range: 30.0 - 100.0 ng/mL 54.3   At risk for osteoporosis. Jonathan [name] is at higher risk of osteopenia and osteoporosis due to Vitamin D deficiency.   Assessment/Plan:   Hypokalemia. Prescription was given for potassium chloride (KLOR-CON) 10 MEQ tablet 1 tablet daily #30 with 0 refills.  Vitamin D deficiency. Low Vitamin D level contributes to fatigue and are associated with obesity, breast, and colon cancer. He was given a prescription for Vitamin D, Ergocalciferol, (DRISDOL) 1.25 MG (50000 UNIT) CAPS capsule every 2 weeks #2 with 0 refills and will follow-up for routine testing of Vitamin D, at least 2-3 times per year to avoid over-replacement.   At risk for osteoporosis. Jonathan [name] was given approximately 15 minutes of osteoporosis prevention counseling today. Jonathan [name] is at risk for osteopenia and osteoporosis due to his Vitamin D deficiency. He was encouraged to take his Vitamin D and follow his higher calcium diet and increase strengthening exercise  to help strengthen his bones and decrease his risk of osteopenia and osteoporosis.  Repetitive spaced learning was employed today to elicit superior memory formation and behavioral change.  Class 2 severe obesity with serious comorbidity and body mass index (BMI) of 38.0 to 38.9 in adult, unspecified obesity type ([place]).  Jonathan [name] is currently in the action stage of change. As such, his goal is to continue with weight loss efforts. He has agreed to following a lower carbohydrate, vegetable and lean protein rich diet plan with veggies and protein rich foods.   We reviewed with the patient labs from 09/27/2019 including CMP, lipids, A1c, insulin, and Vitamin D.  Exercise goals: Jonathan [name] will do more exercise and will increase his walking.  Behavioral modification strategies: increasing lean protein intake, decreasing simple carbohydrates, increasing vegetables, increasing water intake, decreasing eating out, no skipping meals, meal planning and cooking strategies, keeping healthy foods in the home and planning for success.  Jonathan [name] has agreed to follow-up with our clinic in 2 weeks. He was informed of the importance of frequent follow-up visits to maximize his success with intensive lifestyle modifications for his multiple health conditions.   Objective:   Blood pressure 123/76, pulse (!) 49, temperature 98.4 F (36.9 C), height 5\' 11"  (1.803 m), weight 278 lb (126.1 kg), SpO2 95 %. Body mass index is 38.77 kg/m.  General: Cooperative, alert, well developed, in no acute distress. HEENT: Conjunctivae and lids unremarkable. Cardiovascular: Regular rhythm.  Lungs: Normal work of breathing. Neurologic: No focal deficits.   Lab Results  Component Value Date   CREATININE 0.91 09/27/2019   BUN 11  09/27/2019   NA 138 09/27/2019   K 3.6 09/27/2019   CL 94 (L) 09/27/2019   CO2 30 (H) 09/27/2019   Lab Results  Component Value Date   ALT 17 09/27/2019   AST 28 09/27/2019   ALKPHOS 49 09/27/2019    BILITOT 0.7 09/27/2019   Lab Results  Component Value Date   HGBA1C 5.7 (H) 09/27/2019   HGBA1C 5.9 05/09/2019   HGBA1C 6.1 (H) 01/04/2019   HGBA1C 6.5 (H) 08/28/2018   HGBA1C 6.5 04/06/2018   Lab Results  Component Value Date   INSULIN 11.2 09/27/2019   INSULIN 46.3 (H) 08/28/2018   Lab Results  Component Value Date   TSH 3.71 05/09/2019   Lab Results  Component Value Date   CHOL 160 09/27/2019   HDL 51 09/27/2019   LDLCALC 97 09/27/2019   TRIG 60 09/27/2019   CHOLHDL 4 05/09/2019   Lab Results  Component Value Date   WBC 11.2 (H) 05/09/2019   HGB 13.5 05/09/2019   HCT 41.6 05/09/2019   MCV 81.5 05/09/2019   PLT 282.0 05/09/2019   Lab Results  Component Value Date   IRON 150 12/15/2011   FERRITIN 9.2 (L) 12/15/2011   Attestation Statements:   Reviewed by clinician on day of visit: allergies, medications, problem list, medical history, surgical history, family history, social history, and previous encounter notes.  [name], am acting as Location manager for [employer], DO   I have reviewed the above documentation for accuracy and completeness, and I agree with the above. [name], DO

## 2019-10-11 NOTE — Patient Instructions (Signed)
Medication Instructions:  NO CHANGES *If you need a refill on your cardiac medications before your next appointment, please call your pharmacy*   Lab Work: NOT NEEDED   Testing/Procedures: Will be schedule at [place] has requested that you have an echocardiogram. Echocardiography is a painless test that uses sound waves to create images of your heart. It provides your doctor with information about the size and shape of your heart and how well your heart's chambers and valves are working. This procedure takes approximately one hour. There are no restrictions for this procedure.  AND WILL BE SCHEDULE AT [facility] [place] [facility]  ONCE INSURANCE AUTHORIZATION CORONARY CT ANGIOGRAM -Your physician has requested that you have cardiac CTA. Cardiac computed tomography (CT) is a painless test that uses an x-ray machine to take clear, detailed pictures of your heart. Please follow instruction sheet as given.     Follow-Up: At [facility], you and your health needs are our priority.  As part of our continuing mission to provide you with exceptional heart care, we have created designated Provider Care Teams.  These Care Teams include your primary Cardiologist (physician) and Advanced Practice Providers (APPs -  Physician Assistants and Nurse Practitioners) who all work together to provide you with the care you need, when you need it.    Your next appointment:   2 month(s)  The format for your next appointment:   In Person  Provider:   Glenetta Hew, MD   Other Instructions  Your cardiac CT will be scheduled at  the below location:   [facility] [address] [place], [place] [address] [phone]   If scheduled at [facility], please arrive at the [facility] main entrance of [facility] 30 minutes prior to test start time. Proceed to the [facility] Radiology Department (first floor) to check-in and  test prep.    Please follow these instructions carefully (unless otherwise directed):  NO LABS NEEDED AT PRESENT TIME.  BMP WAS DONE 09/27/19  On the Night Before the Test: . Be sure to Drink plenty of water. . Do not consume any caffeinated/decaffeinated beverages or chocolate 12 hours prior to your test. . Do not take any antihistamines 12 hours prior to your test.   On the Day of the Test: . Drink plenty of water. Do not drink any water within one hour of the test. . Do not eat any food 4 hours prior to the test. . You may take your regular medications prior to the test.        After the Test: . Drink plenty of water. . After receiving IV contrast, you may experience a mild flushed feeling. This is normal. . On occasion, you may experience a mild rash up to 24 hours after the test. This is not dangerous. If this occurs, you can take Benadryl 25 mg and increase your fluid intake. . If you experience trouble breathing, this can be serious. If it is severe call 911 IMMEDIATELY. If it is mild, please call our office.    Once we have confirmed authorization from your insurance company, we will call you to set up a date and time for your test. Based on how quickly your insurance processes prior authorizations requests, please allow up to 4 weeks to be contacted for scheduling your Cardiac CT appointment. Be advised that routine Cardiac CT appointments could be scheduled as many as 8 weeks after your provider  has ordered it.  For non-scheduling related questions, please contact the cardiac imaging nurse navigator should you have any questions/concerns: Marchia Bond, Cardiac Imaging Nurse Navigator Burley Saver, Interim Cardiac Imaging Nurse [place] [facility] Direct Office Dial: [phone]   For scheduling needs, including cancellations and rescheduling, please call Vivien Rota at [phone].

## 2019-10-11 NOTE — Progress Notes (Signed)
Primary Care Provider: Dorothyann Peng, NP Cardiologist: No primary care provider on file. Electrophysiologist: None  Clinic Note: Chief Complaint  Patient presents with  . Shortness of Breath    patient states when exercising has shortness of breath    HPI:    [name] is a 64 y.o. male with a PMH notable for obesity and hypertension who presents today for essentially 2 yr follow-up. He was recently seen back in 2019 at the request of Dorothyann Peng, NP for evaluation of exertional dyspnea and edema..   ? CPX Sept 2019: Mild- Moderate functional limitation - due primarily to weight & deconditioning. No evidence of clear Cardiac Limitation - but Hypertensive response to exercise would suggest Diastolic Dysfunction -- may need to increase BP control. No Chronotropic Incompetence ? 2D Echo June 2019: Normal function EF 55 to 60%.  ?? Normal Diastolic Fxn. No RWMA. Mod Pulm HTN - 59 mmHg.   [name] was last seen on January 09, 2018 in follow-up from studies.  He noted his exertional dyspnea had improved.  No chest tightness with rest exertion.  Did well a CPX and was able to get his heart rate up indicating no chronotropic competence.  Blood pressure normally running from systolics of 812 to 751 mmHg.   Was pending evaluation for OSA CPAP this is important based on pulmonary hypertension on echo. -> has now had Sleep Study & in on CPAP.  Recent Hospitalizations: None  Reviewed  CV studies:    The following studies were reviewed today: (if available, images/films reviewed: From Epic Chart or Care Everywhere) . No new studies:   Interval History:   [name] is here today for reevaluation for now having episodes of chest discomfort/tightness and exertional dyspnea over the last roughly 1 month.  He has been involved in the [employer] program and doing phenomenally well with weight loss.  He has been working out routinely with light weights  and treadmill.  But he notes that for the last just about a month he has been noticing that on the treadmill if he goes into stage III (previously not an issue) he starts noticing a tightness sensation across his chest with shortness of breath which he has to stop and that his heart rate come down.  Usually at about 2 minutes of recovery, things improve and he is able to start back to exercise, but not back that same level.  When he does get short of breath he feels lightheaded and dizzy, but not otherwise.  He actually has to stop because he feels like he may blackout.  Now he notes that even when he is doing nontreadmill activity, he will get the sense of tightness and shortness of breath with less stressful activity.  He feels his heart rate goes up faster than it usually would in the preceding couple months.  He has not had any resting symptoms of chest pain or pressure.  No PND orthopnea and no edema.  CV Review of Symptoms (Summary) Cardiovascular ROS: positive for - chest pain, dyspnea on exertion and Lightheadedness, dizziness and near syncope with exertion negative for - edema, irregular heartbeat, loss of consciousness, orthopnea, palpitations, paroxysmal nocturnal dyspnea, rapid heart rate, shortness of breath or TIA/amaurosis fugax.  Claudication  The patient does not have symptoms concerning for COVID-19 infection (fever, chills, cough, or new shortness of breath).  The patient is practicing social distancing & Masking.   May 2021-[employer] vaccine  REVIEWED OF SYSTEMS   Review of Systems  Constitutional: Positive for malaise/fatigue (Over the last couple weeks-months) and weight loss (Intentional with diet and exercise).  HENT: Negative for congestion and nosebleeds.   Respiratory: Positive for shortness of breath (With exertion as noted in HPI).   Cardiovascular: Negative for leg swelling.  Gastrointestinal: Negative for abdominal pain, blood in stool and melena.    Genitourinary: Negative for hematuria.  Musculoskeletal: Positive for joint pain (Routine pain but nothing significant).  Neurological: Negative for dizziness and headaches.  Psychiatric/Behavioral: Negative for depression and memory loss. The patient is not nervous/anxious and does not have insomnia.    I have reviewed and (if needed) personally updated the patient's problem list, medications, allergies, past medical and surgical history, social and family history.   PAST MEDICAL HISTORY   Past Medical History:  Diagnosis Date  . Anxiety   . Asthma   . BPH (benign prostatic hyperplasia)   . ED (erectile dysfunction)   . Elevated PSA    9.33  . Esophageal reflux   . Hearing loss   . History of hip replacement   . History of knee replacement   . Hypertension   . Lactose intolerance   . Numbness in both legs   . OSA on CPAP 2019  . Pre-diabetes   . Prostate cancer [facility]) Jan 2016   prostatectomy  . Vitamin D deficiency     PAST SURGICAL HISTORY   Past Surgical History:  Procedure Laterality Date  . CORONARY CALCIUM SCORE / CARDIAC CT ANGIOGRAM  10/24/2017   Cor Ca Score = 0 - Low risk. Cor CTA: only mild plaque noted in mRCA with mLAD intramyocardial bridging noted.  [name] FOOT SURGERY     bilateral foot  . NM MYOVIEW LTD  07/2007   Normal nuclear stress test.  Mild fixed defect in the anterior wall likely related to soft tissue attenuation.  Normal EF.  Fair exercise capacity.  [name] PROSTATE BIOPSY  06/13/2012   right lat mid gleason 3+3=6  . PROSTATECTOMY  2016  . TOTAL HIP ARTHROPLASTY  2008 &2007   Right and left    MEDICATIONS/ALLERGIES   Current Meds  Medication Sig  . Ascorbic Acid (VITAMIN C) 1000 MG tablet Take 1,000 mg by mouth daily.  [name] atenolol-chlorthalidone (TENORETIC) 50-25 MG tablet TAKE 1 TABLET BY MOUTH EVERY DAY  . [DISCONTINUED] Cholecalciferol (VITAMIN D3) 250 MCG (10000 UT) TABS Take by mouth.  . [DISCONTINUED] potassium chloride (KLOR-CON) 10 MEQ  tablet TAKE 1 TABLET BY MOUTH EVERY DAY    Allergies  Allergen Reactions  . Codeine     REACTION: emesis  . Cozaar [Losartan Potassium] Hives    SOCIAL HISTORY/FAMILY HISTORY   Reviewed in Epic:  Pertinent findings: He is now involved in the [employer] program.  Has dropped from 319 pounds down to current weight of 278 pounds (not sure what today's weight here indicates)  OBJCTIVE -PE, EKG, labs   Wt Readings from Last 3 Encounters:  10/11/19 278 lb (126.1 kg)  10/11/19 262 lb (118.8 kg)  09/27/19 278 lb (126.1 kg)  --?  Our weight here today cannot be accurate.  Physical Exam: BP 140/86   Pulse (!) 49   Temp 97.6 F (36.4 C)   Ht 5' 11"  (1.803 m)   Wt 262 lb (118.8 kg)   SpO2 91%   BMI 36.54 kg/m  Physical Exam Constitutional:      General: He is not in  acute distress.    Appearance: He is well-developed. He is obese.     Comments: Notably less weight than last visit  HENT:     Head: Normocephalic and atraumatic.  Cardiovascular:     Rate and Rhythm: Regular rhythm. Bradycardia present.  No extrasystoles are present.    Chest Wall: PMI is not displaced (Unable to assess).     Pulses: No decreased pulses.     Heart sounds: S1 normal and S2 normal. Heart sounds are distant. No murmur heard.  No friction rub. No gallop.   Pulmonary:     Effort: Pulmonary effort is normal. No accessory muscle usage.     Breath sounds: Normal breath sounds.  Neurological:     Mental Status: He is alert.     Adult ECG Report  Rate: 49 ;  Rhythm: sinus bradycardia and 1 degree AVB.  Left atrial abnormality. ~LVH with prolonged QRS (IVCD);   Narrative Interpretation: Stable  Recent Labs: Recently checked Lab Results  Component Value Date   CHOL 160 09/27/2019   HDL 51 09/27/2019   LDLCALC 97 09/27/2019   TRIG 60 09/27/2019   CHOLHDL 4 05/09/2019   Lab Results  Component Value Date   CREATININE 0.91 09/27/2019   BUN 11 09/27/2019   NA 138 09/27/2019     K 3.6 09/27/2019   CL 94 (L) 09/27/2019   CO2 30 (H) 09/27/2019   Lab Results  Component Value Date   TSH 3.71 05/09/2019    ASSESSMENT/PLAN   Problem List Items Addressed This Visit    Essential hypertension - Primary (Chronic)    Borderline elevated blood pressure today.  He is only on atenolol and chlorthalidone.  I suspect to be potentially add nondehydrating calcium channel blocker, especially in light of him having some mild pulmonary hypertension.  Reassess following echo and coronary CTA      DOE (dyspnea on exertion) (Chronic)    Previously evaluated with a coronary CT angiogram which we can reevaluate now because of recurrent chest pain.  Seems like he was doing relatively well on his treadmill analysis and he cannot do things.  Plan: 2D echo and coronary CTA      Relevant Orders   EKG 12-Lead (Completed)   CT CORONARY MORPH W/CTA COR W/SCORE W/CA W/CM &/OR WO/CM   CT CORONARY FRACTIONAL FLOW RESERVE DATA PREP   CT CORONARY FRACTIONAL FLOW RESERVE FLUID ANALYSIS   ECHOCARDIOGRAM COMPLETE   Chest pain with moderate risk for cardiac etiology (Chronic)    He had a normal coronary CT angiogram back in the past, but now is having exertional dyspnea and chest discomfort again.  Again it could be related to pulmonary hypertension, but with weight loss and being on CPAP, they will be less likely.  The fact that his exertional dyspnea and chest comfort is now new onset is concerning for potential CAD. With body habitus, I suspect a Myoview would not be a good option.   Plan: Ischemic evaluation with coronary CT angiogram, and EF evaluation with echo.      Relevant Orders   EKG 12-Lead (Completed)   CT CORONARY MORPH W/CTA COR W/SCORE W/CA W/CM &/OR WO/CM   CT CORONARY FRACTIONAL FLOW RESERVE DATA PREP   CT CORONARY FRACTIONAL FLOW RESERVE FLUID ANALYSIS   ECHOCARDIOGRAM COMPLETE   Hyperlipidemia due to dietary fat intake (Chronic)    Most recent LDL was 97.  He is  not on a statin.  There has been improvement with his  lifestyle modification, but still has borderline criteria for metabolic syndrome.  Plan: Pending results of coronary CTA, may be more aggressive with lipid management.  But previously coronary calcium score was 0 which would indicate that he is fine at this current level.      Morbid obesity (HCC) (Chronic)   Chronic pulmonary hypertension (HCC) - noted on Echo (Chronic)    Mild to moderate pulmonary hypertension noted on echo in the past.  Now he is having exertional dyspnea, which is relatively new onset over the past month or so.  He is on CPAP now for OSA.  With recurrent dyspnea, will check 2D echo to reassess pressures and EF.      Relevant Orders   ECHOCARDIOGRAM COMPLETE   OSA on CPAP (Chronic)    Now on CPAP.          COVID-19 Education: The signs and symptoms of COVID-19 were discussed with the patient and how to seek care for testing (follow up with PCP or arrange E-visit).   The importance of social distancing and COVID-19 vaccination was discussed today.  I spent a total of 67mnutes with the patient. >  50% of the time was spent in direct patient consultation.  Additional time spent with chart review  / charting (studies, outside notes, etc): 8 Total Time: 36 min   Current medicines are reviewed at length with the patient today.  (+/- concerns) none  Notice: This dictation was prepared with Dragon dictation along with smaller phrase technology. Any transcriptional errors that result from this process are unintentional and may not be corrected upon review.  Patient Instructions / Medication Changes & Studies & Tests Ordered   Patient Instructions  Medication Instructions:  NO CHANGES *If you need a refill on your cardiac medications before your next appointment, please call your pharmacy*   Lab Work: NOT NEEDED   Testing/Procedures: [place] you have an [place]. Echocardiography is a painless test that uses sound waves to create images of your heart. It provides your doctor with information about the size and shape of your heart and how well your heart's chambers and valves are working. This procedure takes approximately one hour. There are no restrictions for this procedure.  AND [place] [facility] [place] [facility] [place]  ONCE INSURANCE AUTHORIZATION CORONARY CT ANGIOGRAM -Your physician has requested that you have cardiac CTA. Cardiac computed tomography (CT) is a painless test that uses an x-ray machine to take clear, detailed pictures of your heart. Please follow instruction sheet as given.     Follow-Up: [facility] and [facility] are our [facility].  As part of our continuing mission to provide you with exceptional heart care, we have created designated Provider Care Teams.  These Care Teams include your primary Cardiologist (physician) and Advanced Practice Providers (APPs -  Physician Assistants and Nurse Practitioners) who all work together to provide you with the care you need, when you need it.    Your next appointment:   2 month(s)  The format for your next appointment:   In Person  Provider:   DGlenetta Hew MD   Other Instructions  Your cardiac CT will be scheduled at  the below location:   [facility] [phone]  [facility] 30 minutes prior to test start time. Proceed to the [facility] Radiology Department (first floor) to check-in and test prep.    Please follow these instructions carefully (unless otherwise directed):  NO LABS NEEDED AT PRESENT TIME.  BMP WAS DONE 09/27/19  On the Night Before the Test: . Be sure to Drink plenty of water. . Do not consume any  caffeinated/decaffeinated beverages or chocolate 12 hours prior to your test. . Do not take any antihistamines 12 hours prior to your test.   On the Day of the Test: . Drink plenty of water. Do not drink any water within one hour of the test. . Do not eat any food 4 hours prior to the test. . You may take your regular medications prior to the test.        After the Test: . Drink plenty of water. . After receiving IV contrast, you may experience a mild flushed feeling. This is normal. . On occasion, you may experience a mild rash up to 24 hours after the test. This is not dangerous. If this occurs, you can take Benadryl 25 mg and increase your fluid intake. . If you experience trouble breathing, this can be serious. If it is severe call 911 IMMEDIATELY. If it is mild, please call our office.    Once we have confirmed authorization from your insurance company, we will call you to set up a date and time for your test. Based on how quickly your insurance processes prior authorizations requests, please allow up to 4 weeks to be contacted for scheduling your Cardiac CT appointment. Be advised that routine Cardiac CT appointments could be scheduled as many as 8 weeks after your provider has ordered it.  For non-scheduling related questions, please contact the cardiac imaging nurse navigator should you have any questions/concerns: Marchia Bond, Cardiac Imaging Nurse Navigator Burley Saver, Interim Cardiac Imaging Nurse [facility] Direct Office Dial: [phone]   For scheduling needs, including cancellations and rescheduling, please call Vivien Rota at [phone].       Studies Ordered:   Orders Placed This Encounter  Procedures  . CT CORONARY MORPH W/CTA COR W/SCORE W/CA W/CM &/OR WO/CM  . CT CORONARY FRACTIONAL FLOW RESERVE DATA PREP  . CT CORONARY FRACTIONAL FLOW RESERVE FLUID ANALYSIS  . EKG 12-Lead  . ECHOCARDIOGRAM COMPLETE     Glenetta Hew,  M.D., M.S. Interventional Cardiologist   Pager # [phone] Phone # [phone] [address] [name], [place] [address]   Thank you for choosing Heartcare at [facility]!!

## 2019-10-14 ENCOUNTER — Encounter: Payer: Self-pay | Admitting: Cardiology

## 2019-10-14 NOTE — Assessment & Plan Note (Signed)
He had a normal coronary CT angiogram back in the past, but now is having exertional dyspnea and chest discomfort again.  Again it could be related to pulmonary hypertension, but with weight loss and being on CPAP, they will be less likely.  The fact that his exertional dyspnea and chest comfort is now new onset is concerning for potential CAD. With body habitus, I suspect a Myoview would not be a good option.   Plan: Ischemic evaluation with coronary CT angiogram, and EF evaluation with echo.

## 2019-10-14 NOTE — Assessment & Plan Note (Signed)
Most recent LDL was 97.  He is not on a statin.  There has been improvement with his lifestyle modification, but still has borderline criteria for metabolic syndrome.  Plan: Pending results of coronary CTA, may be more aggressive with lipid management.  But previously coronary calcium score was 0 which would indicate that he is fine at this current level.

## 2019-10-14 NOTE — Assessment & Plan Note (Signed)
Borderline elevated blood pressure today.  He is only on atenolol and chlorthalidone.  I suspect to be potentially add nondehydrating calcium channel blocker, especially in light of him having some mild pulmonary hypertension.  Reassess following echo and coronary CTA

## 2019-10-14 NOTE — Assessment & Plan Note (Signed)
Now on CPAP.

## 2019-10-14 NOTE — Assessment & Plan Note (Signed)
Previously evaluated with a coronary CT angiogram which we can reevaluate now because of recurrent chest pain.  Seems like he was doing relatively well on his treadmill analysis and he cannot do things.  Plan: 2D echo and coronary CTA

## 2019-10-14 NOTE — Assessment & Plan Note (Signed)
Mild to moderate pulmonary hypertension noted on echo in the past.  Now he is having exertional dyspnea, which is relatively new onset over the past month or so.  He is on CPAP now for OSA.  With recurrent dyspnea, will check 2D echo to reassess pressures and EF.

## 2019-10-14 NOTE — Assessment & Plan Note (Signed)
Has done very well with weight loss.  I suspect the weight that we recorded in our clinic today was not accurate since [facility]

## 2019-10-18 ENCOUNTER — Telehealth: Payer: Self-pay | Admitting: *Deleted

## 2019-10-18 DIAGNOSIS — R0609 Other forms of dyspnea: Secondary | ICD-10-CM

## 2019-10-18 DIAGNOSIS — R079 Chest pain, unspecified: Secondary | ICD-10-CM

## 2019-10-18 DIAGNOSIS — Z01818 Encounter for other preprocedural examination: Secondary | ICD-10-CM

## 2019-10-18 NOTE — Telephone Encounter (Signed)
-----  Message from Raiford Simmonds, RN sent at 10/18/2019  8:09 AM EDT ----- Regarding: m Okay thank you  will notify patient   Ivin Booty  RN  ----- Message ----- From: Roosvelt Maser Sent: 10/17/2019   2:50 PM EDT To: Raiford Simmonds, RN, Delman Cheadle Tai, RN Subject: Ct heart                                         Patient is scheduled on 11/16/19 @ 10am.  He will need labs drawn, his results from 6/17 will be to old  Thanks, Vivien Rota

## 2019-10-18 NOTE — Telephone Encounter (Signed)
Left message to call back .  patient needs an BMP  The week of July 26 ,2021 for upcoming CCTA

## 2019-10-19 NOTE — Telephone Encounter (Signed)
Patient returned call. I advised him of message below.  He verbalized understanding.  He said he will come in that week to get his labs done that week.

## 2019-10-21 ENCOUNTER — Other Ambulatory Visit (INDEPENDENT_AMBULATORY_CARE_PROVIDER_SITE_OTHER): Payer: Self-pay | Admitting: Bariatrics

## 2019-10-21 DIAGNOSIS — E559 Vitamin D deficiency, unspecified: Secondary | ICD-10-CM

## 2019-10-26 ENCOUNTER — Encounter (INDEPENDENT_AMBULATORY_CARE_PROVIDER_SITE_OTHER): Payer: Self-pay | Admitting: Bariatrics

## 2019-10-29 ENCOUNTER — Ambulatory Visit (INDEPENDENT_AMBULATORY_CARE_PROVIDER_SITE_OTHER): Payer: Medicare Other | Admitting: Family Medicine

## 2019-10-29 NOTE — Telephone Encounter (Signed)
Review.

## 2019-10-30 ENCOUNTER — Ambulatory Visit (INDEPENDENT_AMBULATORY_CARE_PROVIDER_SITE_OTHER): Payer: Medicare Other | Admitting: Bariatrics

## 2019-11-02 ENCOUNTER — Other Ambulatory Visit (HOSPITAL_COMMUNITY): Payer: Medicare Other

## 2019-11-06 DIAGNOSIS — R0600 Dyspnea, unspecified: Secondary | ICD-10-CM | POA: Diagnosis not present

## 2019-11-06 DIAGNOSIS — R079 Chest pain, unspecified: Secondary | ICD-10-CM | POA: Diagnosis not present

## 2019-11-06 DIAGNOSIS — Z01818 Encounter for other preprocedural examination: Secondary | ICD-10-CM | POA: Diagnosis not present

## 2019-11-07 LAB — BASIC METABOLIC PANEL
BUN/Creatinine Ratio: 20 (ref 10–24)
BUN: 17 mg/dL (ref 8–27)
CO2: 30 mmol/L — ABNORMAL HIGH (ref 20–29)
Calcium: 9.6 mg/dL (ref 8.6–10.2)
Chloride: 92 mmol/L — ABNORMAL LOW (ref 96–106)
Creatinine, Ser: 0.87 mg/dL (ref 0.76–1.27)
GFR calc Af Amer: 107 mL/min/{1.73_m2} (ref >59)
GFR calc non Af Amer: 92 mL/min/{1.73_m2} (ref >59)
Glucose: 87 mg/dL (ref 65–99)
Potassium: 3.7 mmol/L (ref 3.5–5.2)
Sodium: 136 mmol/L (ref 134–144)

## 2019-11-08 ENCOUNTER — Other Ambulatory Visit (INDEPENDENT_AMBULATORY_CARE_PROVIDER_SITE_OTHER): Payer: Self-pay | Admitting: Bariatrics

## 2019-11-08 DIAGNOSIS — E876 Hypokalemia: Secondary | ICD-10-CM

## 2019-11-13 ENCOUNTER — Other Ambulatory Visit: Payer: Self-pay

## 2019-11-13 ENCOUNTER — Ambulatory Visit (HOSPITAL_COMMUNITY): Payer: Medicare Other | Attending: Cardiology

## 2019-11-13 ENCOUNTER — Ambulatory Visit (INDEPENDENT_AMBULATORY_CARE_PROVIDER_SITE_OTHER): Payer: Medicare Other | Admitting: Bariatrics

## 2019-11-13 ENCOUNTER — Encounter (INDEPENDENT_AMBULATORY_CARE_PROVIDER_SITE_OTHER): Payer: Self-pay | Admitting: Bariatrics

## 2019-11-13 VITALS — BP 132/85 | HR 46 | Temp 98.0°F | Ht 71.0 in | Wt 275.0 lb

## 2019-11-13 DIAGNOSIS — R079 Chest pain, unspecified: Secondary | ICD-10-CM | POA: Insufficient documentation

## 2019-11-13 DIAGNOSIS — I272 Pulmonary hypertension, unspecified: Secondary | ICD-10-CM | POA: Diagnosis not present

## 2019-11-13 DIAGNOSIS — R0600 Dyspnea, unspecified: Secondary | ICD-10-CM | POA: Diagnosis not present

## 2019-11-13 DIAGNOSIS — R7303 Prediabetes: Secondary | ICD-10-CM | POA: Diagnosis not present

## 2019-11-13 DIAGNOSIS — I10 Essential (primary) hypertension: Secondary | ICD-10-CM

## 2019-11-13 DIAGNOSIS — Z6838 Body mass index (BMI) 38.0-38.9, adult: Secondary | ICD-10-CM | POA: Diagnosis not present

## 2019-11-13 DIAGNOSIS — R0609 Other forms of dyspnea: Secondary | ICD-10-CM

## 2019-11-13 HISTORY — PX: TRANSTHORACIC ECHOCARDIOGRAM: SHX275

## 2019-11-13 LAB — ECHOCARDIOGRAM COMPLETE
Area-P 1/2: 3.19 cm2
Height: 71 in
S' Lateral: 4 cm
Weight: 4400 oz

## 2019-11-13 NOTE — Progress Notes (Signed)
Chief Complaint:   OBESITY Jonathan [name] is here to discuss his progress with his obesity treatment plan along with follow-up of his obesity related diagnoses. Jonathan [name] is following a lower carbohydrate, vegetable and lean protein rich diet plan and states he is following his eating plan approximately 75% of the time. Jonathan [name] states he is doing cardio/weights 60 minutes 3 times per week.  Today's visit was #: 23 Starting weight: 319 lbs Starting date: 08/28/2018 Today's weight: 275 lbs Today's date: 11/13/2019 Total lbs lost to date: 44 Total lbs lost since last in-office visit: 3  Interim History: Jonathan [name] is down 3 lbs since his last appointment and has done well overall.  Subjective:   Prediabetes. Jonathan [name] has a diagnosis of prediabetes based on his elevated HgA1c and was informed this puts him at greater risk of developing diabetes. He continues to work on diet and exercise to decrease his risk of diabetes. He denies nausea or hypoglycemia. Jonathan [name] is on no medication.  Lab Results  Component Value Date   HGBA1C 5.7 (H) 09/27/2019   Lab Results  Component Value Date   INSULIN 11.2 09/27/2019   INSULIN 46.3 (H) 08/28/2018   Essential hypertension. Jonathan [name] is on Tenoretic. Blood pressure is reasonably controlled.  BP Readings from Last 3 Encounters:  11/13/19 132/85  10/11/19 123/76  10/11/19 140/86   Lab Results  Component Value Date   CREATININE 0.87 11/06/2019   CREATININE 0.91 09/27/2019   CREATININE 0.95 05/09/2019   Assessment/Plan:   Prediabetes. Jonathan [name] will continue to work on weight loss, exercise, and decreasing simple carbohydrates to help decrease the risk of diabetes.   Essential hypertension. Jonathan [name] is working on healthy weight loss and exercise to improve blood pressure control. We will watch for signs of hypotension as he continues his lifestyle modifications. He will continue his medication as directed.   Class 2 severe obesity with serious  comorbidity and body mass index (BMI) of 38.0 to 38.9 in adult, unspecified obesity type (Jonathan [name]).  Jonathan [name] is currently in the action stage of change. As such, his goal is to continue with weight loss efforts. He has agreed to following a lower carbohydrate, vegetable and lean protein rich diet plan.   He will work on meal planning, intentional eating, and will continue to read labels.  Exercise goals: All adults should avoid inactivity. Some physical activity is better than none, and adults who participate in any amount of physical activity gain some health benefits.  Behavioral modification strategies: increasing lean protein intake, decreasing simple carbohydrates, increasing vegetables, increasing water intake, decreasing eating out, no skipping meals, meal planning and cooking strategies, keeping healthy foods in the home and planning for success.  Jonathan [name] has agreed to follow-up with our clinic in 2-3 weeks. He was informed of the importance of frequent follow-up visits to maximize his success with intensive lifestyle modifications for his multiple health conditions.   Objective:   Blood pressure 132/85, pulse (!) 46, temperature 98 F (36.7 C), temperature source Oral, height 5\' 11"  (1.803 m), weight 275 lb (124.7 kg), SpO2 96 %. Body mass index is 38.35 kg/m.  General: Cooperative, alert, well developed, in no acute distress. HEENT: Conjunctivae and lids unremarkable. Cardiovascular: Regular rhythm.  Lungs: Normal work of breathing. Neurologic: No focal deficits.   Lab Results  Component Value Date   CREATININE 0.87 11/06/2019   BUN 17 11/06/2019   NA 136 11/06/2019   K 3.7 11/06/2019   CL 92 (L) 11/06/2019   CO2  30 (H) 11/06/2019   Lab Results  Component Value Date   ALT 17 09/27/2019   AST 28 09/27/2019   ALKPHOS 49 09/27/2019   BILITOT 0.7 09/27/2019   Lab Results  Component Value Date   HGBA1C 5.7 (H) 09/27/2019   HGBA1C 5.9 05/09/2019   HGBA1C 6.1 (H) 01/04/2019     HGBA1C 6.5 (H) 08/28/2018   HGBA1C 6.5 04/06/2018   Lab Results  Component Value Date   INSULIN 11.2 09/27/2019   INSULIN 46.3 (H) 08/28/2018   Lab Results  Component Value Date   TSH 3.71 05/09/2019   Lab Results  Component Value Date   CHOL 160 09/27/2019   HDL 51 09/27/2019   LDLCALC 97 09/27/2019   TRIG 60 09/27/2019   CHOLHDL 4 05/09/2019   Lab Results  Component Value Date   WBC 11.2 (H) 05/09/2019   HGB 13.5 05/09/2019   HCT 41.6 05/09/2019   MCV 81.5 05/09/2019   PLT 282.0 05/09/2019   Lab Results  Component Value Date   IRON 150 12/15/2011   FERRITIN 9.2 (L) 12/15/2011   Obesity Behavioral Intervention Documentation for Insurance:   Approximately 15 minutes were spent on the discussion below.  ASK: We discussed the diagnosis of obesity with Jonathan [name] today and Jonathan [name] agreed to give [name] permission to discuss obesity behavioral modification therapy today.  ASSESS: Jonathan [name] has the diagnosis of obesity and his BMI today is 38.4. Jonathan [name] is in the action stage of change.   ADVISE: Jonathan [name] was educated on the multiple health risks of obesity as well as the benefit of weight loss to improve his health. He was advised of the need for long term treatment and the importance of lifestyle modifications to improve his current health and to decrease his risk of future health problems.  AGREE: Multiple dietary modification options and treatment options were discussed and Jonathan [name] agreed to follow the recommendations documented in the above note.  ARRANGE: Jonathan [name] was educated on the importance of frequent visits to treat obesity as outlined per CMS and USPSTF guidelines and agreed to schedule his next follow up appointment today.  Attestation Statements:   Reviewed by clinician on day of visit: allergies, medications, problem list, medical history, surgical history, family history, social history, and previous encounter notes.  [name], am acting as Location manager  for [employer], DO   I have reviewed the above documentation for accuracy and completeness, and I agree with the above. [name], DO

## 2019-11-14 ENCOUNTER — Encounter (INDEPENDENT_AMBULATORY_CARE_PROVIDER_SITE_OTHER): Payer: Self-pay | Admitting: Bariatrics

## 2019-11-14 ENCOUNTER — Telehealth (HOSPITAL_COMMUNITY): Payer: Self-pay | Admitting: *Deleted

## 2019-11-14 DIAGNOSIS — G4733 Obstructive sleep apnea (adult) (pediatric): Secondary | ICD-10-CM | POA: Diagnosis not present

## 2019-11-14 NOTE — Telephone Encounter (Signed)

## 2019-11-16 ENCOUNTER — Ambulatory Visit (HOSPITAL_COMMUNITY)
Admission: RE | Admit: 2019-11-16 | Discharge: 2019-11-16 | Disposition: A | Discharge status: Home / Self Care | Payer: Medicare Other | Source: Ambulatory Visit | Attending: Cardiology | Admitting: Cardiology

## 2019-11-16 ENCOUNTER — Encounter: Payer: Medicare Other | Admitting: *Deleted

## 2019-11-16 ENCOUNTER — Encounter (HOSPITAL_COMMUNITY): Payer: Self-pay

## 2019-11-16 ENCOUNTER — Other Ambulatory Visit: Payer: Self-pay

## 2019-11-16 DIAGNOSIS — I288 Other diseases of pulmonary vessels: Secondary | ICD-10-CM | POA: Insufficient documentation

## 2019-11-16 DIAGNOSIS — R0600 Dyspnea, unspecified: Secondary | ICD-10-CM | POA: Diagnosis not present

## 2019-11-16 DIAGNOSIS — R079 Chest pain, unspecified: Secondary | ICD-10-CM | POA: Insufficient documentation

## 2019-11-16 DIAGNOSIS — Z006 Encounter for examination for normal comparison and control in clinical research program: Secondary | ICD-10-CM

## 2019-11-16 DIAGNOSIS — R0609 Other forms of dyspnea: Secondary | ICD-10-CM

## 2019-11-16 HISTORY — PX: OTHER SURGICAL HISTORY: SHX169

## 2019-11-16 MED ORDER — IOHEXOL 350 MG/ML SOLN
80.0000 mL | Freq: Once | INTRAVENOUS | Status: AC | PRN
  Administered 2019-11-16: 80 mL via INTRAVENOUS

## 2019-11-16 MED ORDER — NITROGLYCERIN 0.4 MG SL SUBL: 0.8000 mg | SUBLINGUAL_TABLET | Freq: Once | SUBLINGUAL | Status: DC

## 2019-11-16 MED ORDER — NITROGLYCERIN 0.4 MG SL SUBL
SUBLINGUAL_TABLET | SUBLINGUAL | Status: AC
  Filled 2019-11-16: qty 2

## 2019-11-16 NOTE — Research (Signed)
CADFEM Informed Consent                  Subject Name:  Jonathan [name]   Subject met inclusion and exclusion criteria.  The informed consent form, study requirements and expectations were reviewed with the subject and questions and concerns were addressed prior to the signing of the consent form.  The subject verbalized understanding of the trial requirements.  The subject agreed to participate in the CADFEM trial and signed the informed consent.  The informed consent was obtained prior to performance of any protocol-specific procedures for the subject.  A copy of the signed informed consent was given to the subject and a copy was placed in the subject's medical record.   [place] Serita Degroote, Research Assistant  11/16/2019 09:15 a,m

## 2019-11-18 ENCOUNTER — Other Ambulatory Visit (INDEPENDENT_AMBULATORY_CARE_PROVIDER_SITE_OTHER): Payer: Self-pay | Admitting: Bariatrics

## 2019-11-18 DIAGNOSIS — E559 Vitamin D deficiency, unspecified: Secondary | ICD-10-CM

## 2019-12-04 ENCOUNTER — Encounter (INDEPENDENT_AMBULATORY_CARE_PROVIDER_SITE_OTHER): Payer: Self-pay | Admitting: Bariatrics

## 2019-12-04 ENCOUNTER — Other Ambulatory Visit: Payer: Self-pay

## 2019-12-04 ENCOUNTER — Ambulatory Visit (INDEPENDENT_AMBULATORY_CARE_PROVIDER_SITE_OTHER): Payer: Medicare Other | Admitting: Bariatrics

## 2019-12-04 VITALS — BP 133/80 | HR 50 | Temp 98.0°F | Ht 71.0 in | Wt 274.0 lb

## 2019-12-04 DIAGNOSIS — Z6838 Body mass index (BMI) 38.0-38.9, adult: Secondary | ICD-10-CM

## 2019-12-04 DIAGNOSIS — E559 Vitamin D deficiency, unspecified: Secondary | ICD-10-CM

## 2019-12-04 DIAGNOSIS — I10 Essential (primary) hypertension: Secondary | ICD-10-CM | POA: Diagnosis not present

## 2019-12-04 MED ORDER — VITAMIN D (ERGOCALCIFEROL) 1.25 MG (50000 UNIT) PO CAPS: ORAL_CAPSULE | ORAL | 0 refills | Status: DC

## 2019-12-04 NOTE — Progress Notes (Signed)
Chief Complaint:   Jonathan [name] is here to discuss his progress with his Jonathan treatment plan along with follow-up of his Jonathan related diagnoses. Jonathan [name] is following a lower carbohydrate, vegetable and lean protein rich diet plan and states he is following his eating plan approximately 50% of the time. Jonathan [name] states he is walking 30 minutes 3 times per week.  Today's visit was #: 24 Starting weight: 319 lbs Starting date: 08/28/2018 Today's weight: 274 lbs Today's date: 12/04/2019 Total lbs lost to date: 45 Total lbs lost since last in-office visit: 1  Interim History: Jonathan [name] is down an additional 1 lb. He has been taking an antibiotic and had to eat more. He reports doing well with his water intake.  Subjective:   Essential hypertension. Jonathan [name] is taking Tenoretic.  BP Readings from Last 3 Encounters:  12/04/19 133/80  11/16/19 118/73  11/13/19 132/85   Lab Results  Component Value Date   CREATININE 0.87 11/06/2019   CREATININE 0.91 09/27/2019   CREATININE 0.95 05/09/2019   Vitamin D deficiency. No nausea, vomiting, or muscle weakness.    Ref. Range 09/27/2019 10:19  Vitamin D, 25-Hydroxy Latest Ref Range: 30.0 - 100.0 ng/mL 54.3   Assessment/Plan:   Essential hypertension. Jonathan [name] is working on healthy weight loss and exercise to improve blood pressure control. We will watch for signs of hypotension as he continues his lifestyle modifications.  He will continue his medication as directed.   Vitamin D deficiency. Low Vitamin D level contributes to fatigue and are associated with Jonathan, breast, and colon cancer. He was given a prescription for Vitamin D, Ergocalciferol, (DRISDOL) 1.25 MG (50000 UNIT) CAPS capsule every week #4 with 0 refills and will follow-up for routine testing of Vitamin D, at least 2-3 times per year to avoid over-replacement.   Class 2 severe Jonathan with serious comorbidity and body mass index (BMI) of 38.0 to 38.9 in adult,  unspecified Jonathan type (Jonathan [name]).  Jonathan [name] is currently in the action stage of change. As such, his goal is to continue with weight loss efforts. He has agreed to following a lower carbohydrate, vegetable and lean protein rich diet plan.   He will work on meal planning, intentional eating, and adhering more closely to the plan.  Exercise goals: All adults should avoid inactivity. Some physical activity is better than none, and adults who participate in any amount of physical activity gain some health benefits.  Behavioral modification strategies: increasing lean protein intake, decreasing simple carbohydrates, increasing vegetables, increasing water intake, decreasing eating out, no skipping meals, meal planning and cooking strategies, keeping healthy foods in the home and planning for success.  Jonathan [name] has agreed to follow-up with our clinic in 2-3 weeks. He was informed of the importance of frequent follow-up visits to maximize his success with intensive lifestyle modifications for his multiple health conditions.   Objective:   Blood pressure 133/80, pulse (!) 50, temperature 98 F (36.7 C), temperature source Oral, height 5\' 11"  (1.803 m), weight 274 lb (124.3 kg), SpO2 96 %. Body mass index is 38.22 kg/m.  General: Cooperative, alert, well developed, in no acute distress. HEENT: Conjunctivae and lids unremarkable. Cardiovascular: Regular rhythm.  Lungs: Normal work of breathing. Neurologic: No focal deficits.   Lab Results  Component Value Date   CREATININE 0.87 11/06/2019   BUN 17 11/06/2019   NA 136 11/06/2019   K 3.7 11/06/2019   CL 92 (L) 11/06/2019   CO2 30 (H) 11/06/2019   Lab  Results  Component Value Date   ALT 17 09/27/2019   AST 28 09/27/2019   ALKPHOS 49 09/27/2019   BILITOT 0.7 09/27/2019   Lab Results  Component Value Date   HGBA1C 5.7 (H) 09/27/2019   HGBA1C 5.9 05/09/2019   HGBA1C 6.1 (H) 01/04/2019   HGBA1C 6.5 (H) 08/28/2018   HGBA1C 6.5 04/06/2018    Lab Results  Component Value Date   INSULIN 11.2 09/27/2019   INSULIN 46.3 (H) 08/28/2018   Lab Results  Component Value Date   TSH 3.71 05/09/2019   Lab Results  Component Value Date   CHOL 160 09/27/2019   HDL 51 09/27/2019   LDLCALC 97 09/27/2019   TRIG 60 09/27/2019   CHOLHDL 4 05/09/2019   Lab Results  Component Value Date   WBC 11.2 (H) 05/09/2019   HGB 13.5 05/09/2019   HCT 41.6 05/09/2019   MCV 81.5 05/09/2019   PLT 282.0 05/09/2019   Lab Results  Component Value Date   IRON 150 12/15/2011   FERRITIN 9.2 (L) 12/15/2011   Jonathan Behavioral Intervention Documentation for Insurance:   Approximately 15 minutes were spent on the discussion below.  ASK: We discussed the diagnosis of Jonathan with Jonathan [name] today and Jonathan [name] agreed to give [name] permission to discuss Jonathan behavioral modification therapy today.  ASSESS: Jonathan [name] has the diagnosis of Jonathan and his BMI today is 38.3. [employer] is in the action stage of change.   ADVISE: Jonathan [name] was educated on the multiple health risks of Jonathan as well as the benefit of weight loss to improve his health. He was advised of the need for long term treatment and the importance of lifestyle modifications to improve his current health and to decrease his risk of future health problems.  AGREE: Multiple dietary modification options and treatment options were discussed and Jonathan [name] agreed to follow the recommendations documented in the above note.  ARRANGE: Jonathan [name] was educated on the importance of frequent visits to treat Jonathan as outlined per CMS and USPSTF guidelines and agreed to schedule his next follow up appointment today.  Attestation Statements:   Reviewed by clinician on day of visit: allergies, medications, problem list, medical history, surgical history, family history, social history, and previous encounter notes.  Jonathan [name], am acting as Location manager for [employer], DO   I have reviewed the above  documentation for accuracy and completeness, and I agree with the above. Jonathan Lesch, DO

## 2019-12-05 ENCOUNTER — Other Ambulatory Visit (INDEPENDENT_AMBULATORY_CARE_PROVIDER_SITE_OTHER): Payer: Self-pay | Admitting: Bariatrics

## 2019-12-05 DIAGNOSIS — E876 Hypokalemia: Secondary | ICD-10-CM

## 2019-12-11 ENCOUNTER — Other Ambulatory Visit (INDEPENDENT_AMBULATORY_CARE_PROVIDER_SITE_OTHER): Payer: Self-pay | Admitting: Bariatrics

## 2019-12-11 DIAGNOSIS — E559 Vitamin D deficiency, unspecified: Secondary | ICD-10-CM

## 2019-12-14 ENCOUNTER — Ambulatory Visit: Payer: Medicare Other | Admitting: Cardiology

## 2019-12-24 ENCOUNTER — Other Ambulatory Visit: Payer: Self-pay

## 2019-12-24 ENCOUNTER — Ambulatory Visit (INDEPENDENT_AMBULATORY_CARE_PROVIDER_SITE_OTHER): Payer: Medicare Other | Admitting: Bariatrics

## 2019-12-24 ENCOUNTER — Encounter (INDEPENDENT_AMBULATORY_CARE_PROVIDER_SITE_OTHER): Payer: Self-pay | Admitting: Bariatrics

## 2019-12-24 VITALS — BP 124/75 | HR 48 | Temp 97.9°F | Ht 71.0 in | Wt 274.0 lb

## 2019-12-24 DIAGNOSIS — E559 Vitamin D deficiency, unspecified: Secondary | ICD-10-CM | POA: Diagnosis not present

## 2019-12-24 DIAGNOSIS — E7849 Other hyperlipidemia: Secondary | ICD-10-CM | POA: Diagnosis not present

## 2019-12-24 MED ORDER — VITAMIN D (ERGOCALCIFEROL) 1.25 MG (50000 UNIT) PO CAPS: ORAL_CAPSULE | ORAL | 0 refills | Status: DC

## 2019-12-26 ENCOUNTER — Other Ambulatory Visit: Payer: Self-pay

## 2019-12-26 ENCOUNTER — Ambulatory Visit: Payer: Medicare Other | Admitting: Cardiology

## 2019-12-26 ENCOUNTER — Encounter: Payer: Self-pay | Admitting: Cardiology

## 2019-12-26 VITALS — BP 140/90 | HR 49 | Ht 71.0 in | Wt 282.0 lb

## 2019-12-26 DIAGNOSIS — R0600 Dyspnea, unspecified: Secondary | ICD-10-CM

## 2019-12-26 DIAGNOSIS — R079 Chest pain, unspecified: Secondary | ICD-10-CM | POA: Diagnosis not present

## 2019-12-26 DIAGNOSIS — R001 Bradycardia, unspecified: Secondary | ICD-10-CM

## 2019-12-26 DIAGNOSIS — I272 Pulmonary hypertension, unspecified: Secondary | ICD-10-CM | POA: Diagnosis not present

## 2019-12-26 DIAGNOSIS — E669 Obesity, unspecified: Secondary | ICD-10-CM

## 2019-12-26 DIAGNOSIS — I10 Essential (primary) hypertension: Secondary | ICD-10-CM

## 2019-12-26 DIAGNOSIS — R0609 Other forms of dyspnea: Secondary | ICD-10-CM

## 2019-12-26 MED ORDER — ATENOLOL-CHLORTHALIDONE 50-25 MG PO TABS: ORAL_TABLET | ORAL | 3 refills | Status: DC

## 2019-12-26 MED ORDER — AMLODIPINE BESYLATE 5 MG PO TABS: 5.0000 mg | ORAL_TABLET | Freq: Every day | ORAL | 3 refills | Status: DC

## 2019-12-26 NOTE — Progress Notes (Signed)
Chief Complaint:   OBESITY Jonathan [name] is here to discuss his progress with his obesity treatment plan along with follow-up of his obesity related diagnoses. Jonathan [name] is on following a lower carbohydrate, vegetable and lean protein rich diet plan and states he is following his eating plan approximately 75% of the time. Jonathan [name] states he is doing cardio and lifting weights for 30 minutes each 2 times per week.  Today's visit was #: 25 Starting weight: 319 lbs Starting date: 11/28/2018 Today's weight: 274 lbs Today's date: 12/24/2019 Total lbs lost to date: 45 lbs Total lbs lost since last in-office visit: 0  Interim History: Jonathan [name]'s weight remains the same.  He continues to work out (cardio and resistance).  Subjective:   1. Other hyperlipidemia Jonathan [name] has hyperlipidemia and has been trying to improve his cholesterol levels with intensive lifestyle modification including a low saturated fat diet, exercise and weight loss. He denies any chest pain, claudication or myalgias.  He is on no medications.  Lab Results  Component Value Date   ALT 17 09/27/2019   AST 28 09/27/2019   ALKPHOS 49 09/27/2019   BILITOT 0.7 09/27/2019   Lab Results  Component Value Date   CHOL 160 09/27/2019   HDL 51 09/27/2019   LDLCALC 97 09/27/2019   TRIG 60 09/27/2019   CHOLHDL 4 05/09/2019   2. [name]'s Vitamin D level was 54.3 on 09/27/2019. He is currently taking prescription vitamin D 50,000 IU each week. He denies nausea, vomiting or muscle weakness.  Assessment/Plan:   1. Other hyperlipidemia Cardiovascular risk and specific lipid/LDL goals reviewed.  We discussed several lifestyle modifications today and Jonathan [name] will continue to work on diet, exercise and weight loss efforts. Orders and follow up as documented in patient record.  Continue to work on diet and exercise.  Counseling Intensive lifestyle modifications are the first line treatment for this issue. . Dietary changes:  Increase soluble fiber. Decrease simple carbohydrates. . Exercise changes: Moderate to vigorous-intensity aerobic activity 150 minutes per week if tolerated. . Lipid-lowering medications: see documented in medical record.  2. Vitamin D deficiency Low Vitamin D level contributes to fatigue and are associated with obesity, breast, and colon cancer. He agrees to continue to take prescription Vitamin D @50 ,000 IU every week and will follow-up for routine testing of Vitamin D, at least 2-3 times per year to avoid over-replacement.  -Refill Vitamin D, Ergocalciferol, (DRISDOL) 1.25 MG (50000 UNIT) CAPS capsule; TAKE ONE CAPSULE BY MOUTH EVERY TWO WEEKS  Dispense: 2 capsule; Refill: 0  3. Class 2 severe obesity with serious comorbidity and body mass index (BMI) of 38.0 to 38.9 in adult, unspecified obesity type (HCC) Jonathan [name] is currently in the action stage of change. As such, his goal is to continue with weight loss efforts. He has agreed to following a lower carbohydrate, vegetable and lean protein rich diet plan.   Jonathan [name] will work on meal planning and intentional eating.  Exercise goals: Continue exercise.  Behavioral modification strategies: increasing lean protein intake, decreasing simple carbohydrates, increasing vegetables, increasing water intake, decreasing eating out, no skipping meals, meal planning and cooking strategies, keeping healthy foods in the home and planning for success.  Jonathan [name] has agreed to follow-up with our clinic in 4 weeks. He was informed of the importance of frequent follow-up visits to maximize his success with intensive lifestyle modifications for his multiple health conditions.   Objective:   Blood pressure 124/75, pulse (!) 48, temperature 97.9 F (36.6 C),  height 5\' 11"  (1.803 m), weight 274 lb (124.3 kg), SpO2 95 %. Body mass index is 38.22 kg/m.  General: Cooperative, alert, well developed, in no acute distress. HEENT: Conjunctivae and lids  unremarkable. Cardiovascular: Regular rhythm.  Lungs: Jonathan [name] work of breathing. Neurologic: No focal deficits.   Lab Results  Component Value Date   CREATININE 0.87 11/06/2019   BUN 17 11/06/2019   NA 136 11/06/2019   K 3.7 11/06/2019   CL 92 (L) 11/06/2019   CO2 30 (H) 11/06/2019   Lab Results  Component Value Date   ALT 17 09/27/2019   AST 28 09/27/2019   ALKPHOS 49 09/27/2019   BILITOT 0.7 09/27/2019   Lab Results  Component Value Date   HGBA1C 5.7 (H) 09/27/2019   HGBA1C 5.9 05/09/2019   HGBA1C 6.1 (H) 01/04/2019   HGBA1C 6.5 (H) 08/28/2018   HGBA1C 6.5 04/06/2018   Lab Results  Component Value Date   INSULIN 11.2 09/27/2019   INSULIN 46.3 (H) 08/28/2018   Lab Results  Component Value Date   TSH 3.71 05/09/2019   Lab Results  Component Value Date   CHOL 160 09/27/2019   HDL 51 09/27/2019   LDLCALC 97 09/27/2019   TRIG 60 09/27/2019   CHOLHDL 4 05/09/2019   Lab Results  Component Value Date   WBC 11.2 (H) 05/09/2019   HGB 13.5 05/09/2019   HCT 41.6 05/09/2019   MCV 81.5 05/09/2019   PLT 282.0 05/09/2019   Lab Results  Component Value Date   IRON 150 12/15/2011   FERRITIN 9.2 (L) 12/15/2011   Obesity Behavioral Intervention:   Approximately 15 minutes were spent on the discussion below.  ASK: We discussed the diagnosis of obesity with Jonathan [name] today and Jonathan [name] agreed to give [name] permission to discuss obesity behavioral modification therapy today.  ASSESS: Jonathan [name] has the diagnosis of obesity and his BMI today is 38.3. Jonathan [name] is in the action stage of change.   ADVISE: Jonathan [name] was educated on the multiple health risks of obesity as well as the benefit of weight loss to improve his health. He was advised of the need for long term treatment and the importance of lifestyle modifications to improve his current health and to decrease his risk of future health problems.  AGREE: Multiple dietary modification options and treatment options were discussed  and Jonathan [name] agreed to follow the recommendations documented in the above note.  ARRANGE: Jonathan [name] was educated on the importance of frequent visits to treat obesity as outlined per CMS and USPSTF guidelines and agreed to schedule his next follow up appointment today.  Attestation Statements:   Reviewed by clinician on day of visit: allergies, medications, problem list, medical history, surgical history, family history, social history, and previous encounter notes.  I, Water quality scientist, CMA, am acting as Location manager for [employer], DO  I have reviewed the above documentation for accuracy and completeness, and I agree with the above. [name], DO

## 2019-12-26 NOTE — Progress Notes (Signed)
Primary Care Provider: Dorothyann Peng, NP Cardiologist: No primary care provider on file. Electrophysiologist: None  Clinic Note: Chief Complaint  Patient presents with  . Follow-up    Test results  . Shortness of Breath    Still noting exertional dyspnea with chest pain    HPI:    Jonathan [name] is a 64 y.o. male with a PMH notable for obesity and hypertension who presents today for 2 month f/u to discuss results of coronary CTA to evaluate exertional dyspnea and chest tightness.   He was recently seen back in 2019 at the request of Dorothyann Peng, NP for evaluation of exertional dyspnea and edema..   ? CPX Sept 2019: Mild- Moderate functional limitation - due primarily to weight & deconditioning. No evidence of clear Cardiac Limitation - but Hypertensive response to exercise would suggest Diastolic Dysfunction -- may need to increase BP control. No Chronotropic Incompetence ? 2D Echo June 2019: Normal function EF 55 to 60%.  ?? Normal Diastolic Fxn. No RWMA. Mod Pulm HTN - 59 mmHg.  ? Seen 01/09/2018 to f/u. DOE improved. NO CP with rest/exertion. SBP normally 110-130 mmHg.  Pending OSA-CPAP (stressed importance 2/2 Pulm HTN on Echo). --> Now on CPAP.   Jonathan [name] was referred back & seen on 10/11/2019 for re-eval of episodes Chest Discomfort/ Tightness. Doing well with Wgt Loss working with [place]. Working out with light wgts & on TM. Noted chest tightness when  Reaching Stage III on TM forcing him to stop & let he HR come down. Able to restart after ~2-3 min, but not up to previous level. Sx associated with lightheadedness & dizziness. Was starting to note Sx with more routine activity.   Coronary CTA & Echo ordered.   Recent Hospitalizations: None  Reviewed  CV studies:    The following studies were reviewed today: (if available, images/films reviewed: From Epic Chart or Care Everywhere)  . TTE (11/13/2019): EF 55-60%. NO RWMA. Mild LV dilation & Gr  1 DD. MIld LA dilation. Normal valves. Normal CVP. [name] Cor CTA (11/16/2019): Cor Ca++ Score ZERO. NO evidence of CAD. Dominant RCA-<PDA & PLVB with minimal (<25%) calcified plaque. LM no plaque -< LAD & LCx. LAD w/ large D1 - no plaque. Non-dom LCx with 1 Large OM1 - no plaque. Normal PV drainage. Normal LAA & PA size.   Interval History:   Jonathan [name] is here noting that his exertional dyspnea and chest tightness is still there, but it is really only if he overexerts himself or does very intensive exercise.  It does not occur with routine activity.  He is really trying to adjust his diet and pick up his exercise.  He is hoping that he can lose his weight.  Overall though he is doing fairly well, feeling better than he did by last saw him.  He is cut out sweets and burgers etc., hoping that this will help his weight.  CV Review of Symptoms (Summary) Cardiovascular ROS: positive for - chest pain, dyspnea on exertion and Improved chest pain and dyspnea.  With more intense exercise..  If he goes that far he will get lightheaded. negative for - edema, irregular heartbeat, loss of consciousness, orthopnea, palpitations, paroxysmal nocturnal dyspnea, rapid heart rate, shortness of breath or TIA/amaurosis fugax.  Claudication  The patient DOES NOT have symptoms concerning for COVID-19 infection (fever, chills, cough, or new shortness of breath).  The patient is practicing social distancing & Masking.  May 2021-[employer] vaccine  REVIEWED OF SYSTEMS   Review of Systems  Constitutional: Positive for malaise/fatigue (Steadily improving) and weight loss (Intentional with diet and exercise).  HENT: Negative for congestion and nosebleeds.   Respiratory: Positive for shortness of breath (With exertion as noted in HPI).   Cardiovascular: Negative for leg swelling.  Gastrointestinal: Negative for abdominal pain, blood in stool and melena.  Genitourinary: Negative for hematuria.  Musculoskeletal:  Positive for joint pain (Routine pain but nothing significant).  Neurological: Negative for dizziness and headaches.  Psychiatric/Behavioral: Negative for depression and memory loss. The patient is not nervous/anxious and does not have insomnia.    I have reviewed and (if needed) personally updated the patient's problem list, medications, allergies, past medical and surgical history, social and family history.   PAST MEDICAL HISTORY   Past Medical History:  Diagnosis Date  . Anxiety   . Asthma   . BPH (benign prostatic hyperplasia)   . ED (erectile dysfunction)   . Elevated PSA    9.33  . Esophageal reflux   . Hearing loss   . History of hip replacement   . History of knee replacement   . Hypertension   . Lactose intolerance   . Numbness in both legs   . OSA on CPAP 2019  . Pre-diabetes   . Prostate cancer [facility]) Jan 2016   prostatectomy  . Vitamin D deficiency     PAST SURGICAL HISTORY   Past Surgical History:  Procedure Laterality Date  . CORONARY CALCIUM SCORE / CARDIAC CT ANGIOGRAM  11/16/2019   Cor Ca++ Score ZERO. NO evidence of CAD. Dominant RCA-<PDA & PLVB with minimal (<25%) calcified plaque. LM no plaque -< LAD & LCx. LAD w/ large D1 - no plaque. Non-dom LCx with 1 Large OM1 - no plaque. Normal PV drainage. Normal LAA & PA size.   [name] FOOT SURGERY     bilateral foot  . NM MYOVIEW LTD  07/2007   Normal nuclear stress test.  Mild fixed defect in the anterior wall likely related to soft tissue attenuation.  Normal EF.  Fair exercise capacity.  [name] PROSTATE BIOPSY  06/13/2012   right lat mid gleason 3+3=6  . PROSTATECTOMY  2016  . TOTAL HIP ARTHROPLASTY  2008 &2007   Right and left  . TRANSTHORACIC ECHOCARDIOGRAM  09/2017   EF 55-60%. ??  Normal diastolic function.  No R WMA.  Moderate pulmonary hypertension-PA P estimated 59 mmHg.  [name] TRANSTHORACIC ECHOCARDIOGRAM  11/13/2019    EF 55-60%. NO RWMA. Mild LV dilation & Gr 1 DD. MIld LA dilation. Normal valves. Normal CVP.      MEDICATIONS/ALLERGIES   Current Meds  Medication Sig  . Ascorbic Acid (VITAMIN C) 1000 MG tablet Take 1,000 mg by mouth daily.  [name] atenolol-chlorthalidone (TENORETIC) 50-25 MG tablet TAKE 1/2 TABLET OF 50/25 MG  BY MOUTH DAILY  . potassium chloride (KLOR-CON) 10 MEQ tablet TAKE 1 TABLET BY MOUTH EVERY DAY  . Vitamin D, Ergocalciferol, (DRISDOL) 1.25 MG (50000 UNIT) CAPS capsule TAKE ONE CAPSULE BY MOUTH EVERY TWO WEEKS  . [DISCONTINUED] atenolol-chlorthalidone (TENORETIC) 50-25 MG tablet TAKE 1 TABLET BY MOUTH EVERY DAY    Allergies  Allergen Reactions  . Codeine     REACTION: emesis  . Cozaar [Losartan Potassium] Hives    SOCIAL HISTORY/FAMILY HISTORY   Reviewed in Epic:  Pertinent findings: He is now involved in the [place] program.  Has dropped from 319 pounds down  to current weight of 278 pounds (not sure what today's weight here indicates)  OBJCTIVE -PE, EKG, labs   Wt Readings from Last 3 Encounters:  12/26/19 282 lb (127.9 kg)  12/24/19 274 lb (124.3 kg)  12/04/19 274 lb (124.3 kg)  --?  Our weight here today cannot be accurate.  Physical Exam: BP 140/90   Pulse (!) 49   Ht 5\' 11"  (1.803 m)   Wt 282 lb (127.9 kg)   SpO2 99%   BMI 39.33 kg/m  Physical Exam Constitutional:      General: He is not in acute distress.    Appearance: He is well-developed. He is obese.     Comments: Notably less weight than last visit  HENT:     Head: Normocephalic and atraumatic.  Cardiovascular:     Rate and Rhythm: Regular rhythm. Bradycardia present.  No extrasystoles are present.    Chest Wall: PMI is not displaced (Unable to assess).     Pulses: Normal pulses. No decreased pulses.     Heart sounds: S1 normal and S2 normal. Heart sounds are distant. No murmur heard.  No friction rub. No gallop.   Pulmonary:     Effort: Pulmonary effort is normal. No accessory muscle usage.     Breath sounds: Normal breath sounds.  Abdominal:     General:  Abdomen is flat. Bowel sounds are normal.     Palpations: Abdomen is soft. There is no mass (Obese, unable to assess HSM).  Musculoskeletal:        General: No swelling. Normal range of motion.  Neurological:     General: No focal deficit present.     Mental Status: He is alert and oriented to person, place, and time.  Psychiatric:        Mood and Affect: Mood normal.        Behavior: Behavior normal.        Thought Content: Thought content normal.        Judgment: Judgment normal.     Adult ECG Report Not checked  Recent Labs: Recently checked Lab Results  Component Value Date   CHOL 160 09/27/2019   HDL 51 09/27/2019   LDLCALC 97 09/27/2019   TRIG 60 09/27/2019   CHOLHDL 4 05/09/2019   Lab Results  Component Value Date   CREATININE 0.87 11/06/2019   BUN 17 11/06/2019   NA 136 11/06/2019   K 3.7 11/06/2019   CL 92 (L) 11/06/2019   CO2 30 (H) 11/06/2019   Lab Results  Component Value Date   TSH 3.71 05/09/2019    ASSESSMENT/PLAN   Problem List Items Addressed This Visit    Obesity (BMI 35.0-39.9 without comorbidity) (Chronic)    Thankfully, he does not have any true comorbidities.  He is doing a good job with weight loss.  I continue to congratulated his efforts at the health and wellness center.  Hopefully by backing off on the beta-blocker we will give him some more energy and exercise tolerance to continue in his efforts.      Essential hypertension (Chronic)    His blood pressure is elevated again today.  He indicates that it is usually better at home, however he had similar blood pressure readings when he went for his echocardiogram.  I am little bit concerned about possible chronotropic incompetence with him being on high-dose atenolol.  Plan: Reduce atenolol-chlorthalidone to 1/2 tablet daily (current dose 50-25 mg -> reduce to 25-12.5 mg)  He has had issues with  ARB use in the past-we will add amlodipine 5 mg daily (potentially for microvascular disease  but also for pulmonary vasodilation given his prior history of elevated pulmonary pressures)      Relevant Medications   amLODipine (NORVASC) 5 MG tablet   atenolol-chlorthalidone (TENORETIC) 50-25 MG tablet   DOE (dyspnea on exertion) (Chronic)    Is a normal echocardiogram as cardiogram and coronary CTA would argue against CAD and CHF.  Cannot exclude microvascular disease, however can also not exclude chronotropic incompetence with resting bradycardia.   Plan: Cut atenolol-chlorthalidone dose in half and add amlodipine 5 mg.      Chest pain with moderate risk for cardiac etiology - Primary (Chronic)    Relatively normal coronary CTA in 2019 and now again in 2021.  This was strongly argue against macrovascular CAD.  It is quite likely he has some microvascular disease as well as pulmonary hypertension.  This goes along with his obesity.  Plan: With possible chronotropic incompetence, with reduced dose of beta-blocker to allow addition of dihydropyridine calcium channel blocker (amlodipine)      Morbid obesity (HCC) (Chronic)   Chronic pulmonary hypertension ([place]) - noted on Echo (Chronic)    Not commented on as much during this current echocardiogram, however not likely to be reduced that much.  Continue CPAP for OSA, continue weight loss.  With reduction of beta-blocker dose, we are adding amlodipine for vasodilatory effect.      Relevant Medications   amLODipine (NORVASC) 5 MG tablet   atenolol-chlorthalidone (TENORETIC) 50-25 MG tablet   Bradycardia    Heart rate 48 and 53 during the most recent evaluations.  This leads me to believe that it is possible that his exertional dyspnea and lightheadedness could be related to chronotropic incompetence.  Plan: Reduce atenolol-chlorthalidone dose from 50-25mg  to 25-12.5 mg (1/2 tablet) -> Add amlodipine for additional blood pressure control         COVID-19 Education: The signs and symptoms of COVID-19 were discussed with the  patient and how to seek care for testing (follow up with PCP or arrange E-visit).   The importance of social distancing and COVID-19 vaccination was discussed today.  I spent a total of 31  minutes with the patient. >  50% of the time was spent in direct patient consultation.  Additional time spent with chart review  / charting (studies, outside notes, etc):15 Total Time: 73min  Current medicines are reviewed at length with the patient today.  (+/- concerns) none  Notice: This dictation was prepared with Dragon dictation along with smaller phrase technology. Any transcriptional errors that result from this process are unintentional and may not be corrected upon review.  Patient Instructions / Medication Changes & Studies & Tests Ordered   Patient Instructions  Medication Instructions:   DECREASE TO 1//2 TABLET OF TENORETIC 50/25 MG  DAILY   START TAKING AMLODIPINE 5 MG ONE TABLET DAILY    *If you need a refill on your cardiac medications before your next appointment, please call your pharmacy*   Lab Work: NOT NEEDED   Testing/Procedures: NOT NEEDED   Follow-Up: At [facility], you and your health needs are our priority.  As part of our continuing mission to provide you with exceptional heart care, we have created designated Provider Care Teams.  These Care Teams include your primary Cardiologist (physician) and Advanced Practice Providers (APPs -  Physician Assistants and Nurse Practitioners) who all work together to provide you with the care you need, when you  need it.   Your next appointment:   6 month(s)  The format for your next appointment:   In Person  Provider:   Glenetta Hew, MD       Studies Ordered:   No orders of the defined types were placed in this encounter.    Glenetta Hew, M.D., M.S. Interventional Cardiologist   Pager # [phone] Phone # [phone] [address] [place], [place] [address]   Thank you for choosing  Heartcare at [facility]!!

## 2019-12-26 NOTE — Patient Instructions (Signed)
Medication Instructions:   DECREASE TO 1//2 TABLET OF TENORETIC 50/25 MG  DAILY   START TAKING AMLODIPINE 5 MG ONE TABLET DAILY    *If you need a refill on your cardiac medications before your next appointment, please call your pharmacy*   Lab Work: NOT NEEDED   Testing/Procedures: NOT NEEDED   Follow-Up: At [facility], you and your health needs are our priority.  As part of our continuing mission to provide you with exceptional heart care, we have created designated Provider Care Teams.  These Care Teams include your primary Cardiologist (physician) and Advanced Practice Providers (APPs -  Physician Assistants and Nurse Practitioners) who all work together to provide you with the care you need, when you need it.   Your next appointment:   6 month(s)  The format for your next appointment:   In Person  Provider:   Glenetta Hew, MD

## 2019-12-27 ENCOUNTER — Encounter (INDEPENDENT_AMBULATORY_CARE_PROVIDER_SITE_OTHER): Payer: Self-pay | Admitting: Bariatrics

## 2019-12-30 ENCOUNTER — Encounter: Payer: Self-pay | Admitting: Cardiology

## 2019-12-30 NOTE — Assessment & Plan Note (Signed)
His blood pressure is elevated again today.  He indicates that it is usually better at home, however he had similar blood pressure readings when he went for his echocardiogram.  I am little bit concerned about possible chronotropic incompetence with him being on high-dose atenolol.  Plan: Reduce atenolol-chlorthalidone to 1/2 tablet daily (current dose 50-25 mg -> reduce to 25-12.5 mg)  He has had issues with ARB use in the past-we will add amlodipine 5 mg daily (potentially for microvascular disease but also for pulmonary vasodilation given his prior history of elevated pulmonary pressures)

## 2019-12-30 NOTE — Assessment & Plan Note (Signed)
Relatively normal coronary CTA in 2019 and now again in 2021.  This was strongly argue against macrovascular CAD.  It is quite likely he has some microvascular disease as well as pulmonary hypertension.  This goes along with his obesity.  Plan: With possible chronotropic incompetence, with reduced dose of beta-blocker to allow addition of dihydropyridine calcium channel blocker (amlodipine)

## 2019-12-30 NOTE — Assessment & Plan Note (Signed)
Is a normal echocardiogram as cardiogram and coronary CTA would argue against CAD and CHF.  Cannot exclude microvascular disease, however can also not exclude chronotropic incompetence with resting bradycardia.   Plan: Cut atenolol-chlorthalidone dose in half and add amlodipine 5 mg.

## 2019-12-30 NOTE — Assessment & Plan Note (Signed)
Thankfully, he does not have any true comorbidities.  He is doing a good job with weight loss.  I continue to congratulated his efforts at the health and wellness center.  Hopefully by backing off on the beta-blocker we will give him some more energy and exercise tolerance to continue in his efforts.

## 2019-12-30 NOTE — Assessment & Plan Note (Signed)
Not commented on as much during this current echocardiogram, however not likely to be reduced that much.  Continue CPAP for OSA, continue weight loss.  With reduction of beta-blocker dose, we are adding amlodipine for vasodilatory effect.

## 2019-12-30 NOTE — Assessment & Plan Note (Signed)
Heart rate 48 and 53 during the most recent evaluations.  This leads me to believe that it is possible that his exertional dyspnea and lightheadedness could be related to chronotropic incompetence.  Plan: Reduce atenolol-chlorthalidone dose from 50-25mg  to 25-12.5 mg (1/2 tablet) -> Add amlodipine for additional blood pressure control

## 2020-01-09 ENCOUNTER — Other Ambulatory Visit (INDEPENDENT_AMBULATORY_CARE_PROVIDER_SITE_OTHER): Payer: Self-pay | Admitting: Bariatrics

## 2020-01-09 ENCOUNTER — Encounter (INDEPENDENT_AMBULATORY_CARE_PROVIDER_SITE_OTHER): Payer: Self-pay | Admitting: Bariatrics

## 2020-01-09 DIAGNOSIS — E559 Vitamin D deficiency, unspecified: Secondary | ICD-10-CM

## 2020-01-10 ENCOUNTER — Other Ambulatory Visit (INDEPENDENT_AMBULATORY_CARE_PROVIDER_SITE_OTHER): Payer: Self-pay

## 2020-01-10 DIAGNOSIS — E876 Hypokalemia: Secondary | ICD-10-CM

## 2020-01-10 MED ORDER — POTASSIUM CHLORIDE ER 10 MEQ PO TBCR: 10.0000 meq | EXTENDED_RELEASE_TABLET | Freq: Every day | ORAL | 0 refills | Status: DC

## 2020-01-18 DIAGNOSIS — H5213 Myopia, bilateral: Secondary | ICD-10-CM | POA: Diagnosis not present

## 2020-01-23 ENCOUNTER — Ambulatory Visit (INDEPENDENT_AMBULATORY_CARE_PROVIDER_SITE_OTHER): Payer: Medicare Other | Admitting: Bariatrics

## 2020-01-28 ENCOUNTER — Ambulatory Visit (INDEPENDENT_AMBULATORY_CARE_PROVIDER_SITE_OTHER): Payer: Medicare Other | Admitting: Bariatrics

## 2020-01-28 ENCOUNTER — Encounter (INDEPENDENT_AMBULATORY_CARE_PROVIDER_SITE_OTHER): Payer: Self-pay | Admitting: Bariatrics

## 2020-01-28 ENCOUNTER — Other Ambulatory Visit: Payer: Self-pay

## 2020-01-28 VITALS — BP 131/81 | HR 47 | Temp 97.7°F | Ht 71.0 in | Wt 283.0 lb

## 2020-01-28 DIAGNOSIS — E559 Vitamin D deficiency, unspecified: Secondary | ICD-10-CM

## 2020-01-28 DIAGNOSIS — E876 Hypokalemia: Secondary | ICD-10-CM

## 2020-01-28 DIAGNOSIS — Z6839 Body mass index (BMI) 39.0-39.9, adult: Secondary | ICD-10-CM

## 2020-01-28 DIAGNOSIS — Z9189 Other specified personal risk factors, not elsewhere classified: Secondary | ICD-10-CM | POA: Diagnosis not present

## 2020-01-28 MED ORDER — VITAMIN D (ERGOCALCIFEROL) 1.25 MG (50000 UNIT) PO CAPS: ORAL_CAPSULE | ORAL | 0 refills | Status: DC

## 2020-01-28 MED ORDER — POTASSIUM CHLORIDE ER 10 MEQ PO TBCR: 10.0000 meq | EXTENDED_RELEASE_TABLET | Freq: Every day | ORAL | 0 refills | Status: DC

## 2020-01-29 ENCOUNTER — Encounter: Payer: Self-pay | Admitting: Adult Health

## 2020-01-29 ENCOUNTER — Ambulatory Visit (INDEPENDENT_AMBULATORY_CARE_PROVIDER_SITE_OTHER): Payer: Medicare Other | Admitting: Adult Health

## 2020-01-29 VITALS — BP 118/88 | HR 48 | Temp 97.9°F | Ht 71.0 in | Wt 288.0 lb

## 2020-01-29 DIAGNOSIS — G8929 Other chronic pain: Secondary | ICD-10-CM | POA: Diagnosis not present

## 2020-01-29 DIAGNOSIS — R1031 Right lower quadrant pain: Secondary | ICD-10-CM | POA: Diagnosis not present

## 2020-01-29 MED ORDER — CYCLOBENZAPRINE HCL 10 MG PO TABS: 10.0000 mg | ORAL_TABLET | Freq: Every day | ORAL | 0 refills | Status: DC

## 2020-01-29 NOTE — Progress Notes (Signed)
Subjective:    Patient ID: Jonathan [name], male    DOB: [birth year], 64 y.o.   MRN: [id number]  HPI  64 year old male who  has a past medical history of Anxiety, Asthma, BPH (benign prostatic hyperplasia), ED (erectile dysfunction), Elevated PSA, Esophageal reflux, Hearing loss, History of hip replacement, History of knee replacement, Hypertension, Lactose intolerance, Numbness in both legs, OSA on CPAP (2019), Pre-diabetes, Prostate cancer [facility]) (Jan 2016), and Vitamin D deficiency.  He presents to the office today for an acute issue of right sided groin pain that has been present for roughly one month intermittently. His pain usually happens 3-4 times a week and lasts " a few hours". Has not noticed anything that makes it worse or better, it usually just goes away.   Pain is described as " aching"   He has not changed up his workouts.   Wt Readings from Last 3 Encounters:  01/29/20 288 lb (130.6 kg)  01/28/20 283 lb (128.4 kg)  12/26/19 282 lb (127.9 kg)   He denies abdominal pain, nausea, vomiting, or diarrhea. Does battle with constipation, had a bowel movement today.   He has been using Tylenol and Motrin which seemed to help with the discomfort.  Review of Systems See HPI   Past Medical History:  Diagnosis Date   Anxiety    Asthma    BPH (benign prostatic hyperplasia)    ED (erectile dysfunction)    Elevated PSA    9.33   Esophageal reflux    Hearing loss    History of hip replacement    History of knee replacement    Hypertension    Lactose intolerance    Numbness in both legs    OSA on CPAP 2019   Pre-diabetes    Prostate cancer [facility]) Jan 2016   prostatectomy   Vitamin D deficiency     Social History   Socioeconomic History   Marital status: Married    Spouse name: Not on file   Number of children: 2   Years of education: Not on file   Highest education level: Not on file  Occupational History   Occupation: Education administrator:  [employer]  Tobacco Use   Smoking status: Never Smoker   Smokeless tobacco: Never Used  Scientific laboratory technician Use: Never used  Substance and Sexual Activity   Alcohol use: No   Drug use: No   Sexual activity: Not on file  Other Topics Concern   Not on file  Social History Narrative   Not on file   Social Determinants of Health   Financial Resource Strain:    Difficulty of Paying Living Expenses: Not on file  Food Insecurity:    Worried About Charity fundraiser in the Last Year: Not on file   [place] in the Last Year: Not on file  Transportation Needs:    Lack of Transportation (Medical): Not on file   Lack of Transportation (Non-Medical): Not on file  Physical Activity:    Days of Exercise per Week: Not on file   Minutes of Exercise per Session: Not on file  Stress:    Feeling of Stress : Not on file  Social Connections:    Frequency of Communication with Friends and Family: Not on file   Frequency of Social Gatherings with Friends and Family: Not on file   Attends Religious Services: Not on file   Active Member of Clubs or Organizations:  Not on file   Attends Club or Organization Meetings: Not on file   Marital Status: Not on file  Intimate Partner Violence:    Fear of Current or Ex-Partner: Not on file   Emotionally Abused: Not on file   Physically Abused: Not on file   Sexually Abused: Not on file    Past Surgical History:  Procedure Laterality Date   CORONARY CALCIUM SCORE / CARDIAC CT ANGIOGRAM  11/16/2019   Cor Ca++ Score ZERO. NO evidence of CAD. Dominant RCA-<PDA & PLVB with minimal (<25%) calcified plaque. LM no plaque -< LAD & LCx. LAD w/ large D1 - no plaque. Non-dom LCx with 1 Large OM1 - no plaque. Normal PV drainage. Normal LAA & PA size.    FOOT SURGERY     bilateral foot   NM MYOVIEW LTD  07/2007   Normal nuclear stress test.  Mild fixed defect in the anterior wall likely related to soft tissue attenuation.  Normal EF.   Fair exercise capacity.   PROSTATE BIOPSY  06/13/2012   right lat mid gleason 3+3=6   PROSTATECTOMY  2016   TOTAL HIP ARTHROPLASTY  2008 &2007   Right and left   TRANSTHORACIC ECHOCARDIOGRAM  09/2017   EF 55-60%. ??  Normal diastolic function.  No R WMA.  Moderate pulmonary hypertension-PA P estimated 59 mmHg.   TRANSTHORACIC ECHOCARDIOGRAM  11/13/2019    EF 55-60%. NO RWMA. Mild LV dilation & Gr 1 DD. MIld LA dilation. Normal valves. Normal CVP.    Family History  Problem Relation Age of Onset   Hypertension Mother    Colon cancer Mother    Obesity Mother    Prostate cancer Father 73       alive now age 70   Asthma Father    Heart disease Father        Pt is not sure of details (no MI)   Alcoholism Father    Obesity Father    Lung cancer Maternal Grandmother        non-smoker   Tuberculosis Brother     Allergies  Allergen Reactions   Codeine     REACTION: emesis   Cozaar [Losartan Potassium] Hives    Current Outpatient Medications on File Prior to Visit  Medication Sig Dispense Refill   amLODipine (NORVASC) 5 MG tablet Take 1 tablet (5 mg total) by mouth daily. 90 tablet 3   Ascorbic Acid (VITAMIN C) 1000 MG tablet Take 1,000 mg by mouth daily.     atenolol-chlorthalidone (TENORETIC) 50-25 MG tablet TAKE 1/2 TABLET OF 50/25 MG  BY MOUTH DAILY 90 tablet 3   potassium chloride (KLOR-CON) 10 MEQ tablet Take 1 tablet (10 mEq total) by mouth daily. 30 tablet 0   Vitamin D, Ergocalciferol, (DRISDOL) 1.25 MG (50000 UNIT) CAPS capsule TAKE ONE CAPSULE BY MOUTH EVERY TWO WEEKS 2 capsule 0   No current facility-administered medications on file prior to visit.    BP 118/88 (BP Location: Left Arm, Patient Position: Sitting, Cuff Size: Large)    Pulse (!) 48    Temp 97.9 F (36.6 C) (Oral)    Ht 5\' 11"  (1.803 m)    Wt 288 lb (130.6 kg)    BMI 40.17 kg/m       Objective:   Physical Exam Vitals and nursing note reviewed.  Constitutional:      Appearance:  Normal appearance.  Cardiovascular:     Rate and Rhythm: Normal rate and regular rhythm.  Abdominal:  General: Abdomen is flat. Bowel sounds are normal.     Palpations: Abdomen is soft.     Hernia: No hernia is present.  Musculoskeletal:        General: Tenderness present. Normal range of motion.     Comments: Tenderness with palpation in right groin.  Has some aching discomfort with straight leg raise as well as knee-to-chest.  No issues with internal or external rotation of the right leg.  Skin:    General: Skin is warm and dry.     Capillary Refill: Capillary refill takes less than 2 seconds.  Neurological:     General: No focal deficit present.     Mental Status: He is alert and oriented to person, place, and time.  Psychiatric:        Mood and Affect: Mood normal.        Behavior: Behavior normal.        Thought Content: Thought content normal.        Judgment: Judgment normal.        Assessment & Plan:  1. Groin pain, chronic, right -Peers to be a groin strain that he keeps reinjuring.  Will prescribe Flexeril to take at nighttime.  He understands that this medication may make him sleepy.  Can continue with Tylenol Motrin.  Also advised stretching exercises throughout the day - cyclobenzaprine (FLEXERIL) 10 MG tablet; Take 1 tablet (10 mg total) by mouth at bedtime.  Dispense: 10 tablet; Refill: 0 - Follow up as needed  Dorothyann Peng, NP

## 2020-01-30 ENCOUNTER — Encounter (INDEPENDENT_AMBULATORY_CARE_PROVIDER_SITE_OTHER): Payer: Self-pay | Admitting: Bariatrics

## 2020-01-30 NOTE — Progress Notes (Signed)
Chief Complaint:   OBESITY Jonathan [name] is here to discuss his progress with his obesity treatment plan along with follow-up of his obesity related diagnoses. Jonathan [name] is following a lower carbohydrate, vegetable and lean protein rich diet plan and states he is following his eating plan approximately 50% of the time. Jonathan [name] states he is doing cardio and weights 2 hours 1 time per week.  Today's visit was #: 26 Starting weight: 319 lbs Starting date: 08/28/2018 Today's weight: 283 lbs Today's date: 01/28/2020 Total lbs lost to date: 36 Total lbs lost since last in-office visit: 0  Interim History: Jonathan [name] is up 9 lbs. He had an episode of gout, which has affected his diet and exercise regimen. His body weight is up greater than 6 lbs per the bioimpedance scale.  Subjective:   Vitamin D deficiency. No nausea, vomiting, or muscle weakness.    Ref. Range 09/27/2019 10:19  Vitamin D, 25-Hydroxy Latest Ref Range: 30.0 - 100.0 ng/mL 54.3   Hypokalemia. Jonathan [name] denies muscle cramps.  At risk for activity intolerance. Jonathan [name] is at risk of exercise intolerance due to history of gout and obesity.  Assessment/Plan:   Vitamin D deficiency. Low Vitamin D level contributes to fatigue and are associated with obesity, breast, and colon cancer. He was given a prescription for Vitamin D, Ergocalciferol, (DRISDOL) 1.25 MG (50000 UNIT) CAPS capsule every 2 weeks #2 with 0 refills and will follow-up for routine testing of Vitamin D, at least 2-3 times per year to avoid over-replacement.   Hypokalemia. Prescription was given for potassium chloride (KLOR-CON) 10 MEQ tablet 1 daily #30 with 0 refills.  At risk for activity intolerance. Jonathan [name] was given approximately 15 minutes of exercise intolerance counseling today. He is 64 y.o. male and has risk factors exercise intolerance including obesity. We discussed intensive lifestyle modifications today with an emphasis on specific weight loss instructions  and strategies. Jonathan [name] will slowly increase activity as tolerated.  Repetitive spaced learning was employed today to elicit superior memory formation and behavioral change.  Class 2 severe obesity with serious comorbidity and body mass index (BMI) of 39.0 to 39.9 in adult, unspecified obesity type ([place]).  Jonathan [name] is currently in the action stage of change. As such, his goal is to continue with weight loss efforts. He has agreed to following a lower carbohydrate, vegetable and lean protein rich diet plan.   He will work on meal planning and will resume activities.  Exercise goals: Jonathan [name] will resume activities.  Behavioral modification strategies: increasing lean protein intake, decreasing simple carbohydrates, increasing vegetables, increasing water intake, decreasing eating out, no skipping meals, meal planning and cooking strategies, keeping healthy foods in the home and planning for success.  Jonathan [name] has agreed to follow-up with our clinic in 2 weeks. He was informed of the importance of frequent follow-up visits to maximize his success with intensive lifestyle modifications for his multiple health conditions.   Objective:   Blood pressure 131/81, pulse (!) 47, temperature 97.7 F (36.5 C), height 5\' 11"  (1.803 m), weight 283 lb (128.4 kg), SpO2 98 %. Body mass index is 39.47 kg/m.  General: Cooperative, alert, well developed, in no acute distress. HEENT: Conjunctivae and lids unremarkable. Cardiovascular: Regular rhythm.  Lungs: Normal work of breathing. Neurologic: No focal deficits.   Lab Results  Component Value Date   CREATININE 0.87 11/06/2019   BUN 17 11/06/2019   NA 136 11/06/2019   K 3.7 11/06/2019   CL 92 (L) 11/06/2019   CO2  30 (H) 11/06/2019   Lab Results  Component Value Date   ALT 17 09/27/2019   AST 28 09/27/2019   ALKPHOS 49 09/27/2019   BILITOT 0.7 09/27/2019   Lab Results  Component Value Date   HGBA1C 5.7 (H) 09/27/2019   HGBA1C 5.9 05/09/2019    HGBA1C 6.1 (H) 01/04/2019   HGBA1C 6.5 (H) 08/28/2018   HGBA1C 6.5 04/06/2018   Lab Results  Component Value Date   INSULIN 11.2 09/27/2019   INSULIN 46.3 (H) 08/28/2018   Lab Results  Component Value Date   TSH 3.71 05/09/2019   Lab Results  Component Value Date   CHOL 160 09/27/2019   HDL 51 09/27/2019   LDLCALC 97 09/27/2019   TRIG 60 09/27/2019   CHOLHDL 4 05/09/2019   Lab Results  Component Value Date   WBC 11.2 (H) 05/09/2019   HGB 13.5 05/09/2019   HCT 41.6 05/09/2019   MCV 81.5 05/09/2019   PLT 282.0 05/09/2019   Lab Results  Component Value Date   IRON 150 12/15/2011   FERRITIN 9.2 (L) 12/15/2011   Attestation Statements:   Reviewed by clinician on day of visit: allergies, medications, problem list, medical history, surgical history, family history, social history, and previous encounter notes.  [name], am acting as Location manager for [employer], DO   I have reviewed the above documentation for accuracy and completeness, and I agree with the above. [name], DO

## 2020-02-01 ENCOUNTER — Other Ambulatory Visit (INDEPENDENT_AMBULATORY_CARE_PROVIDER_SITE_OTHER): Payer: Self-pay | Admitting: Bariatrics

## 2020-02-01 DIAGNOSIS — E876 Hypokalemia: Secondary | ICD-10-CM

## 2020-02-04 ENCOUNTER — Other Ambulatory Visit: Payer: Self-pay | Admitting: Gastroenterology

## 2020-02-04 ENCOUNTER — Other Ambulatory Visit (INDEPENDENT_AMBULATORY_CARE_PROVIDER_SITE_OTHER): Payer: Self-pay | Admitting: Bariatrics

## 2020-02-04 DIAGNOSIS — R1310 Dysphagia, unspecified: Secondary | ICD-10-CM

## 2020-02-04 DIAGNOSIS — E559 Vitamin D deficiency, unspecified: Secondary | ICD-10-CM

## 2020-02-04 DIAGNOSIS — J029 Acute pharyngitis, unspecified: Secondary | ICD-10-CM

## 2020-02-12 ENCOUNTER — Ambulatory Visit
Admission: RE | Admit: 2020-02-12 | Discharge: 2020-02-12 | Disposition: A | Discharge status: Home / Self Care | Payer: Medicare Other | Source: Ambulatory Visit | Attending: Gastroenterology | Admitting: Gastroenterology

## 2020-02-12 ENCOUNTER — Other Ambulatory Visit: Payer: Self-pay | Admitting: Adult Health

## 2020-02-12 DIAGNOSIS — J029 Acute pharyngitis, unspecified: Secondary | ICD-10-CM

## 2020-02-12 DIAGNOSIS — R1310 Dysphagia, unspecified: Secondary | ICD-10-CM

## 2020-02-12 DIAGNOSIS — G4733 Obstructive sleep apnea (adult) (pediatric): Secondary | ICD-10-CM | POA: Diagnosis not present

## 2020-02-18 ENCOUNTER — Other Ambulatory Visit: Payer: Self-pay

## 2020-02-18 ENCOUNTER — Encounter (INDEPENDENT_AMBULATORY_CARE_PROVIDER_SITE_OTHER): Payer: Self-pay | Admitting: Bariatrics

## 2020-02-18 ENCOUNTER — Ambulatory Visit (INDEPENDENT_AMBULATORY_CARE_PROVIDER_SITE_OTHER): Payer: Medicare Other | Admitting: Bariatrics

## 2020-02-18 VITALS — BP 130/82 | HR 52 | Temp 97.5°F | Ht 71.0 in | Wt 279.0 lb

## 2020-02-18 DIAGNOSIS — I10 Essential (primary) hypertension: Secondary | ICD-10-CM | POA: Diagnosis not present

## 2020-02-18 DIAGNOSIS — Z6839 Body mass index (BMI) 39.0-39.9, adult: Secondary | ICD-10-CM

## 2020-02-18 DIAGNOSIS — E559 Vitamin D deficiency, unspecified: Secondary | ICD-10-CM | POA: Diagnosis not present

## 2020-02-18 MED ORDER — VITAMIN D (ERGOCALCIFEROL) 1.25 MG (50000 UNIT) PO CAPS: ORAL_CAPSULE | ORAL | 0 refills | Status: DC

## 2020-02-19 ENCOUNTER — Encounter (INDEPENDENT_AMBULATORY_CARE_PROVIDER_SITE_OTHER): Payer: Self-pay | Admitting: Bariatrics

## 2020-02-19 NOTE — Progress Notes (Signed)
Chief Complaint:   OBESITY Jonathan [name] is here to discuss his progress with his obesity treatment plan along with follow-up of his obesity related diagnoses. Jonathan [name] is following a lower carbohydrate, vegetable and lean protein rich diet plan and states he is following his eating plan approximately 90% of the time. Jonathan [name] states he is doing cardio 45 minutes 4 times per week and strengthening 25 minutes 4 times per week.  Today's visit was #: 27 Starting weight: 319 lbs Starting date: 08/28/2018 Today's weight: 279 lbs Today's date: 02/18/2020 Total lbs lost to date: 40 Total lbs lost since last in-office visit: 4  Interim History: Jonathan [name] is down an additional 4 lbs since his last visit and has done well overall.  Subjective:   Vitamin D deficiency. Jonathan [name] is on Vitamin D supplementation and reports taking his medication as directed.   Ref. Range 09/27/2019 10:19  Vitamin D, 25-Hydroxy Latest Ref Range: 30.0 - 100.0 ng/mL 54.3   Essential hypertension. Jonathan [name] reports taking medications as directed.   BP Readings from Last 3 Encounters:  02/18/20 130/82  01/29/20 118/88  01/28/20 131/81   Lab Results  Component Value Date   CREATININE 0.87 11/06/2019   CREATININE 0.91 09/27/2019   CREATININE 0.95 05/09/2019   Assessment/Plan:   Vitamin D deficiency. Low Vitamin D level contributes to fatigue and are associated with obesity, breast, and colon cancer. He was given a prescription for Vitamin D, Ergocalciferol, (DRISDOL) 1.25 MG (50000 UNIT) CAPS capsule every 2 weeks #2 with 0 refills and will follow-up for routine testing of Vitamin D, at least 2-3 times per year to avoid over-replacement.   Essential hypertension. Jonathan [name] is working on healthy weight loss and exercise to improve blood pressure control. We will watch for signs of hypotension as he continues his lifestyle modifications. He will continue medications as directed.   Class 2 severe obesity with serious  comorbidity and body mass index (BMI) of 39.0 to 39.9 in adult, unspecified obesity type (Jonathan [name]).  Jonathan [name] is currently in the action stage of change. As such, his goal is to continue with weight loss efforts. He has agreed to following a lower carbohydrate, vegetable and lean protein rich diet plan.   He will stay with good adherence to the low carb plan and will keep his water intake high.  Exercise goals: Jonathan [name] will continue his current exercise regimen.   Behavioral modification strategies: increasing lean protein intake, decreasing simple carbohydrates, increasing vegetables, increasing water intake, decreasing eating out, no skipping meals, meal planning and cooking strategies, keeping healthy foods in the home and planning for success.  Jonathan [name] has agreed to follow-up with our clinic fasting in 2 weeks. He was informed of the importance of frequent follow-up visits to maximize his success with intensive lifestyle modifications for his multiple health conditions.   Objective:   Blood pressure 130/82, pulse (!) 52, temperature (!) 97.5 F (36.4 C), temperature source Oral, height 5\' 11"  (1.803 m), weight 279 lb (126.6 kg), SpO2 96 %. Body mass index is 38.91 kg/m.  General: Cooperative, alert, well developed, in no acute distress. HEENT: Conjunctivae and lids unremarkable. Cardiovascular: Regular rhythm.  Lungs: Normal work of breathing. Neurologic: No focal deficits.   Lab Results  Component Value Date   CREATININE 0.87 11/06/2019   BUN 17 11/06/2019   NA 136 11/06/2019   K 3.7 11/06/2019   CL 92 (L) 11/06/2019   CO2 30 (H) 11/06/2019   Lab Results  Component Value Date  ALT 17 09/27/2019   AST 28 09/27/2019   ALKPHOS 49 09/27/2019   BILITOT 0.7 09/27/2019   Lab Results  Component Value Date   HGBA1C 5.7 (H) 09/27/2019   HGBA1C 5.9 05/09/2019   HGBA1C 6.1 (H) 01/04/2019   HGBA1C 6.5 (H) 08/28/2018   HGBA1C 6.5 04/06/2018   Lab Results  Component Value Date    INSULIN 11.2 09/27/2019   INSULIN 46.3 (H) 08/28/2018   Lab Results  Component Value Date   TSH 3.71 05/09/2019   Lab Results  Component Value Date   CHOL 160 09/27/2019   HDL 51 09/27/2019   LDLCALC 97 09/27/2019   TRIG 60 09/27/2019   CHOLHDL 4 05/09/2019   Lab Results  Component Value Date   WBC 11.2 (H) 05/09/2019   HGB 13.5 05/09/2019   HCT 41.6 05/09/2019   MCV 81.5 05/09/2019   PLT 282.0 05/09/2019   Lab Results  Component Value Date   IRON 150 12/15/2011   FERRITIN 9.2 (L) 12/15/2011   Obesity Behavioral Intervention:   Approximately 15 minutes were spent on the discussion below.  ASK: We discussed the diagnosis of obesity with Jonathan [name] today and Jonathan [name] agreed to give [name] permission to discuss obesity behavioral modification therapy today.  ASSESS: Jonathan [name] has the diagnosis of obesity and his BMI today is 39.0. Jonathan [name] is in the action stage of change.   ADVISE: Jonathan [name] was educated on the multiple health risks of obesity as well as the benefit of weight loss to improve his health. He was advised of the need for long term treatment and the importance of lifestyle modifications to improve his current health and to decrease his risk of future health problems.  AGREE: Multiple dietary modification options and treatment options were discussed and Jonathan [name] agreed to follow the recommendations documented in the above note.  ARRANGE: Jonathan [name] was educated on the importance of frequent visits to treat obesity as outlined per CMS and USPSTF guidelines and agreed to schedule his next follow up appointment today.  Attestation Statements:   Reviewed by clinician on day of visit: allergies, medications, problem list, medical history, surgical history, family history, social history, and previous encounter notes.  [name], am acting as Location manager for [employer], DO   I have reviewed the above documentation for accuracy and completeness, and I agree with the above. [name], DO

## 2020-02-25 DIAGNOSIS — L723 Sebaceous cyst: Secondary | ICD-10-CM | POA: Diagnosis not present

## 2020-02-25 DIAGNOSIS — H52203 Unspecified astigmatism, bilateral: Secondary | ICD-10-CM | POA: Diagnosis not present

## 2020-02-27 ENCOUNTER — Other Ambulatory Visit: Payer: Self-pay

## 2020-02-27 ENCOUNTER — Encounter: Payer: Self-pay | Admitting: Pulmonary Disease

## 2020-02-27 ENCOUNTER — Ambulatory Visit (INDEPENDENT_AMBULATORY_CARE_PROVIDER_SITE_OTHER): Payer: Medicare Other | Admitting: Pulmonary Disease

## 2020-02-27 VITALS — BP 138/80 | HR 60 | Temp 97.5°F | Ht 71.0 in | Wt 286.0 lb

## 2020-02-27 DIAGNOSIS — Z9989 Dependence on other enabling machines and devices: Secondary | ICD-10-CM | POA: Diagnosis not present

## 2020-02-27 DIAGNOSIS — G4733 Obstructive sleep apnea (adult) (pediatric): Secondary | ICD-10-CM

## 2020-02-27 NOTE — Progress Notes (Signed)
Jonathan [name]    [id number]    [birth year]  Primary Care Physician:Nafziger, Tommi Rumps, NP  Referring Physician: Dorothyann Peng, NP [place],  [place] [address]  Chief complaint:   Obstructive sleep apnea  HPI:  Remains very compliant with CPAP use Uses CPAP nightly Continues to benefit from CPAP use on a regular basis Continues to feel better  Had some changes made to his blood pressure medications recently, this is affecting his sleep quality at night but generally still manages to get a good number of hours of sleep at night  Has no significant concerns about CPAP currently  He wakes up multiple times at night because of prostate issues, needing to use the bathroom  Has lost over 30 pounds recently  Loud breathing even when taking a nap during the day  Has gained a lot of weight since retiring from the [employer]-he is changing many things to help him lose weight Denies a headache, he does have a dry throat of the mornings History of prostate cancer Orthopnea  Occupation: Worked for the postal service Exposures: No exposure to mold Smoking history: Non-smoker  Outpatient Encounter Medications as of 02/27/2020  Medication Sig  . amLODipine (NORVASC) 5 MG tablet Take 1 tablet (5 mg total) by mouth daily.  . Ascorbic Acid (VITAMIN C) 1000 MG tablet Take 1,000 mg by mouth daily.  [name] atenolol-chlorthalidone (TENORETIC) 50-25 MG tablet TAKE 1/2 TABLET OF 50/25 MG  BY MOUTH DAILY  . cyclobenzaprine (FLEXERIL) 10 MG tablet Take 1 tablet (10 mg total) by mouth at bedtime.  . potassium chloride (KLOR-CON) 10 MEQ tablet Take 1 tablet (10 mEq total) by mouth daily.  . Vitamin D, Ergocalciferol, (DRISDOL) 1.25 MG (50000 UNIT) CAPS capsule TAKE ONE CAPSULE BY MOUTH EVERY TWO WEEKS   No facility-administered encounter medications on file as of 02/27/2020.    Allergies as of 02/27/2020 - Review Complete 02/27/2020  Allergen Reaction Noted  . Codeine    .  Cozaar [losartan potassium] Hives 09/08/2011    Past Medical History:  Diagnosis Date  . Anxiety   . Asthma   . BPH (benign prostatic hyperplasia)   . ED (erectile dysfunction)   . Elevated PSA    9.33  . Esophageal reflux   . Hearing loss   . History of hip replacement   . History of knee replacement   . Hypertension   . Lactose intolerance   . Numbness in both legs   . OSA on CPAP 2019  . Pre-diabetes   . Prostate cancer [facility]) Jan 2016   prostatectomy  . Vitamin D deficiency     Past Surgical History:  Procedure Laterality Date  . CORONARY CALCIUM SCORE / CARDIAC CT ANGIOGRAM  11/16/2019   Cor Ca++ Score ZERO. NO evidence of CAD. Dominant RCA-<PDA & PLVB with minimal (<25%) calcified plaque. LM no plaque -< LAD & LCx. LAD w/ large D1 - no plaque. Non-dom LCx with 1 Large OM1 - no plaque. Normal PV drainage. Normal LAA & PA size.   [name] FOOT SURGERY     bilateral foot  . NM MYOVIEW LTD  07/2007   Normal nuclear stress test.  Mild fixed defect in the anterior wall likely related to soft tissue attenuation.  Normal EF.  Fair exercise capacity.  [name] PROSTATE BIOPSY  06/13/2012   right lat mid gleason 3+3=6  . PROSTATECTOMY  2016  . TOTAL HIP ARTHROPLASTY  2008 &2007  Right and left  . TRANSTHORACIC ECHOCARDIOGRAM  09/2017   EF 55-60%. ??  Normal diastolic function.  No R WMA.  Moderate pulmonary hypertension-PA P estimated 59 mmHg.  [name] TRANSTHORACIC ECHOCARDIOGRAM  11/13/2019    EF 55-60%. NO RWMA. Mild LV dilation & Gr 1 DD. MIld LA dilation. Normal valves. Normal CVP.    Family History  Problem Relation Age of Onset  . Hypertension Mother   . Colon cancer Mother   . Obesity Mother   . Prostate cancer Father 52       alive now age 39  . Asthma Father   . Heart disease Father        Pt is not sure of details (no MI)  . Alcoholism Father   . Obesity Father   . Lung cancer Maternal Grandmother        non-smoker  . Tuberculosis Brother     Social History    Socioeconomic History  . Marital status: Married    Spouse name: Not on file  . Number of children: 2  . Years of education: Not on file  . Highest education level: Not on file  Occupational History  . Occupation: Education administrator: [employer]  Tobacco Use  . Smoking status: Never Smoker  . Smokeless tobacco: Never Used  Vaping Use  . Vaping Use: Never used  Substance and Sexual Activity  . Alcohol use: No  . Drug use: No  . Sexual activity: Not on file  Other Topics Concern  . Not on file  Social History Narrative  . Not on file   Social Determinants of Health   Financial Resource Strain:   . Difficulty of Paying Living Expenses: Not on file  Food Insecurity:   . Worried About Charity fundraiser in the Last Year: Not on file  . Ran Out of Food in the Last Year: Not on file  Transportation Needs:   . Lack of Transportation (Medical): Not on file  . Lack of Transportation (Non-Medical): Not on file  Physical Activity:   . Days of Exercise per Week: Not on file  . Minutes of Exercise per Session: Not on file  Stress:   . Feeling of Stress : Not on file  Social Connections:   . Frequency of Communication with Friends and Family: Not on file  . Frequency of Social Gatherings with Friends and Family: Not on file  . Attends Religious Services: Not on file  . Active Member of Clubs or Organizations: Not on file  . Attends Archivist Meetings: Not on file  . Marital Status: Not on file  Intimate Partner Violence:   . Fear of Current or Ex-Partner: Not on file  . Emotionally Abused: Not on file  . Physically Abused: Not on file  . Sexually Abused: Not on file    Review of systems: Review of systems Denies any fever or chills No visual symptoms No shortness of breath with normal activity Was having some bradycardia with his blood pressure medications All other system review is normal  Physical Exam:  Vitals:   02/27/20 0948  BP: 138/80  Pulse: 60   Temp: (!) 97.5 F (36.4 C)  SpO2: 99%   Middle-age gentleman does not appear to be in distress Mallampati 3, neck is thick Clear breath sounds bilaterally S1-S2 appreciated Bowel sounds appreciated Mild peripheral edema   Results of the Epworth flowsheet 03/21/2018 11/22/2017  Sitting and reading 0 3  Watching TV 0  3  Sitting, inactive in a public place (e.g. a theatre or a meeting) 0 3  As a passenger in a car for an hour without a break 0 3  Lying down to rest in the afternoon when circumstances permit 3 3  Sitting and talking to someone 0 2  Sitting quietly after a lunch without alcohol 0 2  In a car, while stopped for a few minutes in traffic 0 0  Total score 3 19    Data Reviewed: [name]  Recent CT, coronary CT noted  .  Cardiopulmonary exercise study result noted -Compliance revealed 67% compliance, residual AHI of 5.7 -We discussed improving compliance  PFT shows no obstruction, no significant bronchodilator response, moderate restriction with mild reduction in diffusing capacity  Most recent compliance data of 2 02/25/2020 reveals 97% compliance Set between 5 and 20 95 percentile pressure of 14.4 Residual AHI of 2.7  Assessment:  .  Obstructive sleep apnea adequately treated with CPAP therapy  .  Morbid obesity  .  Hypertension   .  Pulmonary hypertension  .  Daytime sleepiness continues to improve with CPAP use   Plan/Recommendations: .  Continue using CPAP on a regular basis  .  Encourage weight loss measures  .  Regular exercise as tolerated  .  I will follow-up with him in about a year .  Encouraged to call with any significant concerns    Sherrilyn Rist MD [facility] 02/27/2020, 9:52 AM  CC: Dorothyann Peng, NP

## 2020-02-27 NOTE — Patient Instructions (Signed)
Obstructive sleep apnea  -Your sleep apnea is well treated with your CPAP at present We do not need to make any changes  I will follow-up with you in about a year  Call us with any concerns

## 2020-02-28 ENCOUNTER — Other Ambulatory Visit (INDEPENDENT_AMBULATORY_CARE_PROVIDER_SITE_OTHER): Payer: Self-pay | Admitting: Bariatrics

## 2020-02-28 DIAGNOSIS — E876 Hypokalemia: Secondary | ICD-10-CM

## 2020-02-28 NOTE — Telephone Encounter (Signed)
Refill request

## 2020-03-02 ENCOUNTER — Other Ambulatory Visit (INDEPENDENT_AMBULATORY_CARE_PROVIDER_SITE_OTHER): Payer: Self-pay | Admitting: Bariatrics

## 2020-03-02 DIAGNOSIS — E559 Vitamin D deficiency, unspecified: Secondary | ICD-10-CM

## 2020-03-03 NOTE — Telephone Encounter (Signed)
This patient was last seen by Dr. Owens Shark, and currently has an upcoming appt scheduled on 03/10/20 with her.

## 2020-03-10 ENCOUNTER — Ambulatory Visit (INDEPENDENT_AMBULATORY_CARE_PROVIDER_SITE_OTHER): Payer: Medicare Other | Admitting: Bariatrics

## 2020-03-10 ENCOUNTER — Other Ambulatory Visit: Payer: Self-pay

## 2020-03-10 ENCOUNTER — Encounter (INDEPENDENT_AMBULATORY_CARE_PROVIDER_SITE_OTHER): Payer: Self-pay | Admitting: Bariatrics

## 2020-03-10 VITALS — BP 129/71 | HR 57 | Temp 97.8°F | Ht 71.0 in | Wt 282.0 lb

## 2020-03-10 DIAGNOSIS — R7303 Prediabetes: Secondary | ICD-10-CM

## 2020-03-10 DIAGNOSIS — E7849 Other hyperlipidemia: Secondary | ICD-10-CM

## 2020-03-10 DIAGNOSIS — I10 Essential (primary) hypertension: Secondary | ICD-10-CM | POA: Diagnosis not present

## 2020-03-10 DIAGNOSIS — G4733 Obstructive sleep apnea (adult) (pediatric): Secondary | ICD-10-CM

## 2020-03-10 DIAGNOSIS — Z6839 Body mass index (BMI) 39.0-39.9, adult: Secondary | ICD-10-CM

## 2020-03-10 DIAGNOSIS — E559 Vitamin D deficiency, unspecified: Secondary | ICD-10-CM | POA: Diagnosis not present

## 2020-03-10 DIAGNOSIS — Z9989 Dependence on other enabling machines and devices: Secondary | ICD-10-CM

## 2020-03-10 NOTE — Progress Notes (Signed)
Chief Complaint:   OBESITY Jonathan [name] is here to discuss his progress with his obesity treatment plan along with follow-up of his obesity related diagnoses. Jonathan [name] is following a lower carbohydrate, vegetable and lean protein rich diet plan and states he is following his eating plan approximately 80% of the time. Jonathan [name] states he is doing cardio 60 minutes and weights 20 minutes 3 times per week.  Today's visit was #: 28 Starting weight: 319 lbs Starting date: 08/28/2018 Today's weight: 282 lbs Today's date: 03/10/2020 Total lbs lost to date: 37 Total lbs lost since last in-office visit: 0  Interim History: Jonathan [name] is up 3 lbs since his last visit.  Subjective:   Essential hypertension. Blood pressure is controlled.  BP Readings from Last 3 Encounters:  03/10/20 129/71  02/27/20 138/80  02/18/20 130/82   Lab Results  Component Value Date   CREATININE 0.87 11/06/2019   CREATININE 0.91 09/27/2019   CREATININE 0.95 05/09/2019   OSA on CPAP. Jonathan [name] is using CPAP.  Hyperlipidemia due to dietary fat intake. Jonathan [name] is on no medication.   Lab Results  Component Value Date   CHOL 160 09/27/2019   HDL 51 09/27/2019   LDLCALC 97 09/27/2019   TRIG 60 09/27/2019   CHOLHDL 4 05/09/2019   Lab Results  Component Value Date   ALT 17 09/27/2019   AST 28 09/27/2019   ALKPHOS 49 09/27/2019   BILITOT 0.7 09/27/2019   The 10-year ASCVD risk score Jonathan Bussing DC Jr., Jonathan al., Jonathan [name]) is: 25.3%   Values used to calculate the score:     Age: 64 years     Sex: Male     Is Non-Hispanic African American: Yes     Diabetic: Yes     Tobacco smoker: No     Systolic Blood Pressure: 295 mmHg     Is BP treated: Yes     HDL Cholesterol: 51 mg/dL     Total Cholesterol: 160 mg/dL  Vitamin D deficiency. [name] is taking Vitamin D supplementation.    Ref. Range 09/27/2019 10:19  Vitamin D, 25-Hydroxy Latest Ref Range: 30.0 - 100.0 ng/mL 54.3   Prediabetes. Jonathan [name] has a diagnosis of  prediabetes based on his elevated HgA1c and was informed this puts him at greater risk of developing diabetes. He continues to work on diet and exercise to decrease his risk of diabetes. He denies nausea or hypoglycemia. Jonathan [name] is on no medication.  Lab Results  Component Value Date   HGBA1C 5.7 (H) 09/27/2019   Lab Results  Component Value Date   INSULIN 11.2 09/27/2019   INSULIN 46.3 (H) 08/28/2018   Assessment/Plan:   Essential hypertension. Jonathan [name] is working on healthy weight loss and exercise to improve blood pressure control. We will watch for signs of hypotension as he continues his lifestyle modifications. He will continue his medication as directed. Comprehensive metabolic panel will be checked today.  OSA on CPAP. Intensive lifestyle modifications are the first line treatment for this issue. We discussed several lifestyle modifications today and he will continue to work on diet, exercise and weight loss efforts. We will continue to monitor. Orders and follow up as documented in patient record. Jonathan [name] will continue CPAP use nightly.  Hyperlipidemia due to dietary fat intake. Cardiovascular risk and specific lipid/LDL goals reviewed.  We discussed several lifestyle modifications today and Jonathan [name] will continue to work on diet, exercise and weight loss efforts. Orders and follow up as documented in patient record. Lipid Panel With  LDL/HDL Ratio will be checked today.  Counseling Intensive lifestyle modifications are the first line treatment for this issue. . Dietary changes: Increase soluble fiber. Decrease simple carbohydrates. . Exercise changes: Moderate to vigorous-intensity aerobic activity 150 minutes per week if tolerated. . Lipid-lowering medications: see documented in medical record.   Vitamin D deficiency. Low Vitamin D level contributes to fatigue and are associated with obesity, breast, and colon cancer. VITAMIN D 25 Hydroxy (Vit-D Deficiency, Fractures) level will be  checked today.   Prediabetes. Jonathan [name] will continue to work on weight loss, exercise, and decreasing simple carbohydrates to help decrease the risk of diabetes. Insulin, random, Hemoglobin A1c levels will be checked today.  Class 2 severe obesity with serious comorbidity and body mass index (BMI) of 39.0 to 39.9 in adult, unspecified obesity type ([place]).  Jonathan [name] is currently in the action stage of change. As such, his goal is to continue with weight loss efforts. He has agreed to the [employer].   He will work on meal planning, continuing to adhere closely to the plan (intermittent fasting), and keeping snacks at 150 calories.  Exercise goals: All adults should avoid inactivity. Some physical activity is better than none, and adults who participate in any amount of physical activity gain some health benefits.  Behavioral modification strategies: increasing lean protein intake, decreasing simple carbohydrates, increasing vegetables, increasing water intake, decreasing eating out, no skipping meals, meal planning and cooking strategies, keeping healthy foods in the home and planning for success.  Jonathan [name] has agreed to follow-up with our clinic in 2-3 weeks. He was informed of the importance of frequent follow-up visits to maximize his success with intensive lifestyle modifications for his multiple health conditions.   Objective:   Blood pressure 129/71, pulse (!) 57, temperature 97.8 F (36.6 C), height 5\' 11"  (1.803 m), weight 282 lb (127.9 kg), SpO2 94 %. Body mass index is 39.33 kg/m.  General: Cooperative, alert, well developed, in no acute distress. HEENT: Conjunctivae and lids unremarkable. Cardiovascular: Regular rhythm.  Lungs: Normal work of breathing. Neurologic: No focal deficits.   Lab Results  Component Value Date   CREATININE 0.87 11/06/2019   BUN 17 11/06/2019   NA 136 11/06/2019   K 3.7 11/06/2019   CL 92 (L) 11/06/2019   CO2 30 (H) 11/06/2019   Lab Results   Component Value Date   ALT 17 09/27/2019   AST 28 09/27/2019   ALKPHOS 49 09/27/2019   BILITOT 0.7 09/27/2019   Lab Results  Component Value Date   HGBA1C 5.7 (H) 09/27/2019   HGBA1C 5.9 05/09/2019   HGBA1C 6.1 (H) 01/04/2019   HGBA1C 6.5 (H) 08/28/2018   HGBA1C 6.5 04/06/2018   Lab Results  Component Value Date   INSULIN 11.2 09/27/2019   INSULIN 46.3 (H) 08/28/2018   Lab Results  Component Value Date   TSH 3.71 05/09/2019   Lab Results  Component Value Date   CHOL 160 09/27/2019   HDL 51 09/27/2019   LDLCALC 97 09/27/2019   TRIG 60 09/27/2019   CHOLHDL 4 05/09/2019   Lab Results  Component Value Date   WBC 11.2 (H) 05/09/2019   HGB 13.5 05/09/2019   HCT 41.6 05/09/2019   MCV 81.5 05/09/2019   PLT 282.0 05/09/2019   Lab Results  Component Value Date   IRON 150 09/04/Jonathan [name]   FERRITIN 9.2 (L) 09/04/Jonathan [name]   Attestation Statements:   Reviewed by clinician on day of visit: allergies, medications, problem list, medical history, surgical history, family history,  social history, and previous encounter notes.  [name], am acting as Location manager for [employer], DO   I have reviewed the above documentation for accuracy and completeness, and I agree with the above. [name], DO

## 2020-03-11 DIAGNOSIS — R070 Pain in throat: Secondary | ICD-10-CM | POA: Diagnosis not present

## 2020-03-11 LAB — COMPREHENSIVE METABOLIC PANEL
ALT: 15 IU/L (ref 0–44)
AST: 18 IU/L (ref 0–40)
Albumin/Globulin Ratio: 1.1 — ABNORMAL LOW (ref 1.2–2.2)
Albumin: 3.9 g/dL (ref 3.8–4.8)
Alkaline Phosphatase: 61 IU/L (ref 44–121)
BUN/Creatinine Ratio: 15 (ref 10–24)
BUN: 13 mg/dL (ref 8–27)
Bilirubin Total: 0.4 mg/dL (ref 0.0–1.2)
CO2: 29 mmol/L (ref 20–29)
Calcium: 9.5 mg/dL (ref 8.6–10.2)
Chloride: 96 mmol/L (ref 96–106)
Creatinine, Ser: 0.85 mg/dL (ref 0.76–1.27)
GFR calc Af Amer: 107 mL/min/{1.73_m2} (ref >59)
GFR calc non Af Amer: 93 mL/min/{1.73_m2} (ref >59)
Globulin, Total: 3.7 g/dL (ref 1.5–4.5)
Glucose: 91 mg/dL (ref 65–99)
Potassium: 3.6 mmol/L (ref 3.5–5.2)
Sodium: 137 mmol/L (ref 134–144)
Total Protein: 7.6 g/dL (ref 6.0–8.5)

## 2020-03-11 LAB — LIPID PANEL WITH LDL/HDL RATIO
Cholesterol, Total: 164 mg/dL (ref 100–199)
HDL: 48 mg/dL (ref >39)
LDL Chol Calc (NIH): 101 mg/dL — ABNORMAL HIGH (ref 0–99)
LDL/HDL Ratio: 2.1 ratio (ref 0.0–3.6)
Triglycerides: 81 mg/dL (ref 0–149)
VLDL Cholesterol Cal: 15 mg/dL (ref 5–40)

## 2020-03-11 LAB — HEMOGLOBIN A1C
Est. average glucose Bld gHb Est-mCnc: 123 mg/dL
Hgb A1c MFr Bld: 5.9 % — ABNORMAL HIGH (ref 4.8–5.6)

## 2020-03-11 LAB — INSULIN, RANDOM: INSULIN: 19.1 u[IU]/mL (ref 2.6–24.9)

## 2020-03-11 LAB — VITAMIN D 25 HYDROXY (VIT D DEFICIENCY, FRACTURES): Vit D, 25-Hydroxy: 42.5 ng/mL (ref 30.0–100.0)

## 2020-03-20 DIAGNOSIS — D23111 Other benign neoplasm of skin of right upper eyelid, including canthus: Secondary | ICD-10-CM | POA: Diagnosis not present

## 2020-03-20 DIAGNOSIS — D23112 Other benign neoplasm of skin of right lower eyelid, including canthus: Secondary | ICD-10-CM | POA: Diagnosis not present

## 2020-03-21 ENCOUNTER — Other Ambulatory Visit (INDEPENDENT_AMBULATORY_CARE_PROVIDER_SITE_OTHER): Payer: Self-pay | Admitting: Bariatrics

## 2020-03-21 DIAGNOSIS — E876 Hypokalemia: Secondary | ICD-10-CM

## 2020-03-24 ENCOUNTER — Telehealth (INDEPENDENT_AMBULATORY_CARE_PROVIDER_SITE_OTHER): Payer: Medicare Other | Admitting: Bariatrics

## 2020-03-24 NOTE — Telephone Encounter (Signed)
This patient was last seen by Dr. Owens Shark, and currently has an upcoming appt scheduled on 03/24/20 with her.

## 2020-03-28 ENCOUNTER — Encounter (INDEPENDENT_AMBULATORY_CARE_PROVIDER_SITE_OTHER): Payer: Self-pay | Admitting: Bariatrics

## 2020-03-28 DIAGNOSIS — E876 Hypokalemia: Secondary | ICD-10-CM

## 2020-03-28 DIAGNOSIS — E559 Vitamin D deficiency, unspecified: Secondary | ICD-10-CM

## 2020-03-31 ENCOUNTER — Other Ambulatory Visit (INDEPENDENT_AMBULATORY_CARE_PROVIDER_SITE_OTHER): Payer: Self-pay | Admitting: Bariatrics

## 2020-03-31 DIAGNOSIS — E559 Vitamin D deficiency, unspecified: Secondary | ICD-10-CM

## 2020-03-31 MED ORDER — VITAMIN D (ERGOCALCIFEROL) 1.25 MG (50000 UNIT) PO CAPS: ORAL_CAPSULE | ORAL | 0 refills | Status: DC

## 2020-03-31 MED ORDER — POTASSIUM CHLORIDE ER 10 MEQ PO TBCR: 10.0000 meq | EXTENDED_RELEASE_TABLET | Freq: Every day | ORAL | 0 refills | Status: DC

## 2020-03-31 NOTE — Telephone Encounter (Signed)
This patient was last seen by Dr. Owens Shark, and currently has an upcoming appt scheduled on 04/14/20 with her.

## 2020-03-31 NOTE — Telephone Encounter (Signed)
Refill request

## 2020-04-09 ENCOUNTER — Other Ambulatory Visit: Payer: Self-pay

## 2020-04-10 ENCOUNTER — Other Ambulatory Visit: Payer: Self-pay | Admitting: Adult Health

## 2020-04-10 ENCOUNTER — Encounter: Payer: Medicare Other | Admitting: Adult Health

## 2020-04-10 ENCOUNTER — Telehealth: Payer: Self-pay | Admitting: Adult Health

## 2020-04-10 NOTE — Telephone Encounter (Signed)
Pt is calling in stating that he is out of Rx amlodipine (NORVASC) 5 MG   Pharm:  [facility] on [address]

## 2020-04-10 NOTE — Telephone Encounter (Signed)
Patient called back and stated he needs a refill on Atenolol instead.  I informed the pt a years supply was given by Dr Herbie Baltimore on 9/15 also and to contact his office with questions and he agreed.

## 2020-04-10 NOTE — Telephone Encounter (Signed)
Left a detailed message at the pts cell number with the information below. 

## 2020-04-10 NOTE — Telephone Encounter (Signed)
Was filled in September 2021 for one year by Cardiology. He has refills at pharmacy

## 2020-04-14 ENCOUNTER — Encounter (INDEPENDENT_AMBULATORY_CARE_PROVIDER_SITE_OTHER): Payer: Self-pay | Admitting: Bariatrics

## 2020-04-14 ENCOUNTER — Ambulatory Visit (INDEPENDENT_AMBULATORY_CARE_PROVIDER_SITE_OTHER): Payer: Medicare Other | Admitting: Bariatrics

## 2020-04-24 ENCOUNTER — Other Ambulatory Visit (INDEPENDENT_AMBULATORY_CARE_PROVIDER_SITE_OTHER): Payer: Self-pay | Admitting: Bariatrics

## 2020-04-24 DIAGNOSIS — E876 Hypokalemia: Secondary | ICD-10-CM

## 2020-04-30 ENCOUNTER — Other Ambulatory Visit (INDEPENDENT_AMBULATORY_CARE_PROVIDER_SITE_OTHER): Payer: Self-pay | Admitting: Bariatrics

## 2020-04-30 DIAGNOSIS — E559 Vitamin D deficiency, unspecified: Secondary | ICD-10-CM

## 2020-05-02 ENCOUNTER — Encounter: Payer: Medicare Other | Admitting: Adult Health

## 2020-05-12 DIAGNOSIS — G4733 Obstructive sleep apnea (adult) (pediatric): Secondary | ICD-10-CM | POA: Diagnosis not present

## 2020-05-15 DIAGNOSIS — R109 Unspecified abdominal pain: Secondary | ICD-10-CM | POA: Diagnosis not present

## 2020-05-17 ENCOUNTER — Other Ambulatory Visit (INDEPENDENT_AMBULATORY_CARE_PROVIDER_SITE_OTHER): Payer: Self-pay | Admitting: Bariatrics

## 2020-05-17 DIAGNOSIS — E559 Vitamin D deficiency, unspecified: Secondary | ICD-10-CM

## 2020-05-19 NOTE — Telephone Encounter (Signed)
Last seen by Dr. Brown. 

## 2020-05-20 ENCOUNTER — Other Ambulatory Visit: Payer: Self-pay

## 2020-05-20 ENCOUNTER — Encounter: Payer: Self-pay | Admitting: Adult Health

## 2020-05-20 ENCOUNTER — Ambulatory Visit (INDEPENDENT_AMBULATORY_CARE_PROVIDER_SITE_OTHER): Payer: Medicare Other | Admitting: Adult Health

## 2020-05-20 VITALS — BP 130/92 | Temp 97.6°F | Ht 71.0 in | Wt 292.0 lb

## 2020-05-20 DIAGNOSIS — Z0000 Encounter for general adult medical examination without abnormal findings: Secondary | ICD-10-CM | POA: Diagnosis not present

## 2020-05-20 DIAGNOSIS — E559 Vitamin D deficiency, unspecified: Secondary | ICD-10-CM

## 2020-05-20 DIAGNOSIS — Z9989 Dependence on other enabling machines and devices: Secondary | ICD-10-CM

## 2020-05-20 DIAGNOSIS — G4733 Obstructive sleep apnea (adult) (pediatric): Secondary | ICD-10-CM | POA: Diagnosis not present

## 2020-05-20 DIAGNOSIS — R7303 Prediabetes: Secondary | ICD-10-CM | POA: Diagnosis not present

## 2020-05-20 DIAGNOSIS — Z6839 Body mass index (BMI) 39.0-39.9, adult: Secondary | ICD-10-CM

## 2020-05-20 DIAGNOSIS — M1040 Other secondary gout, unspecified site: Secondary | ICD-10-CM | POA: Diagnosis not present

## 2020-05-20 DIAGNOSIS — E7849 Other hyperlipidemia: Secondary | ICD-10-CM

## 2020-05-20 DIAGNOSIS — I10 Essential (primary) hypertension: Secondary | ICD-10-CM | POA: Diagnosis not present

## 2020-05-20 LAB — COMPREHENSIVE METABOLIC PANEL
ALT: 12 U/L (ref 0–53)
AST: 16 U/L (ref 0–37)
Albumin: 3.9 g/dL (ref 3.5–5.2)
Alkaline Phosphatase: 60 U/L (ref 39–117)
BUN: 14 mg/dL (ref 6–23)
CO2: 37 mEq/L — ABNORMAL HIGH (ref 19–32)
Calcium: 9.7 mg/dL (ref 8.4–10.5)
Chloride: 92 mEq/L — ABNORMAL LOW (ref 96–112)
Creatinine, Ser: 0.88 mg/dL (ref 0.40–1.50)
GFR: 91.5 mL/min (ref >60.00)
Glucose, Bld: 80 mg/dL (ref 70–99)
Potassium: 3.7 mEq/L (ref 3.5–5.1)
Sodium: 135 mEq/L (ref 135–145)
Total Bilirubin: 0.9 mg/dL (ref 0.2–1.2)
Total Protein: 7.8 g/dL (ref 6.0–8.3)

## 2020-05-20 LAB — CBC WITH DIFFERENTIAL/PLATELET
Basophils Absolute: 0 10*3/uL (ref 0.0–0.1)
Basophils Relative: 0.3 % (ref 0.0–3.0)
Eosinophils Absolute: 0.1 10*3/uL (ref 0.0–0.7)
Eosinophils Relative: 1.7 % (ref 0.0–5.0)
HCT: 41.6 % (ref 39.0–52.0)
Hemoglobin: 13.8 g/dL (ref 13.0–17.0)
Lymphocytes Relative: 23.8 % (ref 12.0–46.0)
Lymphs Abs: 1.7 10*3/uL (ref 0.7–4.0)
MCHC: 33.1 g/dL (ref 30.0–36.0)
MCV: 80 fl (ref 78.0–100.0)
Monocytes Absolute: 0.8 10*3/uL (ref 0.1–1.0)
Monocytes Relative: 10.3 % (ref 3.0–12.0)
Neutro Abs: 4.7 10*3/uL (ref 1.4–7.7)
Neutrophils Relative %: 63.9 % (ref 43.0–77.0)
Platelets: 198 10*3/uL (ref 150.0–400.0)
RBC: 5.19 Mil/uL (ref 4.22–5.81)
RDW: 16.2 % — ABNORMAL HIGH (ref 11.5–15.5)
WBC: 7.3 10*3/uL (ref 4.0–10.5)

## 2020-05-20 LAB — TSH: TSH: 1.59 u[IU]/mL (ref 0.35–4.50)

## 2020-05-20 LAB — URIC ACID: Uric Acid, Serum: 7.5 mg/dL (ref 4.0–7.8)

## 2020-05-20 LAB — HEMOGLOBIN A1C: Hgb A1c MFr Bld: 6 % (ref 4.6–6.5)

## 2020-05-20 MED ORDER — AMLODIPINE BESYLATE 2.5 MG PO TABS: 2.5000 mg | ORAL_TABLET | Freq: Every day | ORAL | 0 refills | Status: DC

## 2020-05-20 NOTE — Progress Notes (Signed)
Subjective:    Patient ID: Jonathan [name], male    DOB: [birth year], 65 y.o.   MRN: [id number]  HPI Patient presents for yearly preventative medicine examination. He is a pleasant 65 year old male who  has a past medical history of Anxiety, Asthma, BPH (benign prostatic hyperplasia), ED (erectile dysfunction), Elevated PSA, Esophageal reflux, Hearing loss, History of hip replacement, History of knee replacement, Hypertension, Lactose intolerance, Numbness in both legs, OSA on CPAP (2019), Pre-diabetes, Prostate cancer [facility]) (Jan 2016), and Vitamin D deficiency.  Essential Hypertension -currently prescribed atenolol/chlorthalidone 50-25 mg. He was placed on Norvasc 5 mg by Cardiology but stopped taking this about a week ago due to lower extremity edema.   He denies dizziness, lightheadedness, chest pain, or shortness of breath.  He does take K-Dur 10 mEq due to history of hypokalemia with diuretic BP Readings from Last 3 Encounters:  05/20/20 (!) 130/92  03/10/20 129/71  02/27/20 138/80   Hyperlipidemia - mildly elevated LDL - not currently on medication  Lab Results  Component Value Date   CHOL 164 03/10/2020   HDL 48 03/10/2020   LDLCALC 101 (H) 03/10/2020   TRIG 81 03/10/2020   CHOLHDL 4 05/09/2019   OSA - uses CPAP nightly.  Does not experience daytime somnolence or the need for a nap.  Obesity -has been working with healthy weight and wellness.  He is following lower carbohydrate, vegetable, and lean protein rich diet majority of the time.  Unfortunately over the last few months his exercise regimen has been sporadic, he was diagnosed with COVID-19, he had a "twisted ankle" and he continues to have some right groin pain that his urologist believes is from kidney stones.  He does continue to eat a heart healthy diet for the most part. His starting weight was 319 pounds.  Wt Readings from Last 3 Encounters:  05/20/20 292 lb (132.5 kg)  03/10/20 282 lb (127.9 kg)  02/27/20 286 lb  (129.7 kg)   History of Prostate Cancer -is followed by urology at [facility].  Prior treatments include robotic prostatectomy in 2016.  He does note having frequency and erectile dysfunction, but denies incontinence.  Vitamin D Deficiency - takes Vitamin D 50,000 units weekly  Last vitamin D Lab Results  Component Value Date   VD25OH 42.5 03/10/2020   Glucose intolerance - Diet controlled.  Lab Results  Component Value Date   HGBA1C 5.9 (H) 03/10/2020   Gout -has 3-4 acute flares a year.  Last was in October 2021.  All immunizations and health maintenance protocols were reviewed with the patient and needed orders were placed.  Appropriate screening laboratory values were ordered for the patient including screening of hyperlipidemia, renal function and hepatic function. If indicated by BPH, a PSA was ordered.  Medication reconciliation,  past medical history, social history, problem list and allergies were reviewed in detail with the patient  Goals were established with regard to weight loss, exercise, and  diet in compliance with medications  He had his last colonoscopy at [facility] in 2021 - he is on the three year plan due to family history of colon CA and polyps    Review of Systems  Constitutional: Negative.   HENT: Negative.   Eyes: Negative.   Respiratory: Negative.   Cardiovascular: Negative.   Gastrointestinal: Negative.   Endocrine: Negative.   Genitourinary: Negative.   Musculoskeletal: Negative.   Skin: Negative.   Allergic/Immunologic: Negative.   Neurological: Negative.   Hematological: Negative.  Psychiatric/Behavioral: Negative.   All other systems reviewed and are negative.  Past Medical History:  Diagnosis Date  . Anxiety   . Asthma   . BPH (benign prostatic hyperplasia)   . ED (erectile dysfunction)   . Elevated PSA    9.33  . Esophageal reflux   . Hearing loss   . History of hip replacement   . History of knee replacement   . Hypertension   .  Lactose intolerance   . Numbness in both legs   . OSA on CPAP 2019  . Pre-diabetes   . Prostate cancer [facility]) Jan 2016   prostatectomy  . Vitamin D deficiency     Social History   Socioeconomic History  . Marital status: Married    Spouse name: Not on file  . Number of children: 2  . Years of education: Not on file  . Highest education level: Not on file  Occupational History  . Occupation: Education administrator: [employer]  Tobacco Use  . Smoking status: Never Smoker  . Smokeless tobacco: Never Used  Vaping Use  . Vaping Use: Never used  Substance and Sexual Activity  . Alcohol use: No  . Drug use: No  . Sexual activity: Not on file  Other Topics Concern  . Not on file  Social History Narrative  . Not on file   Social Determinants of Health   Financial Resource Strain: Not on file  Food Insecurity: Not on file  Transportation Needs: Not on file  Physical Activity: Not on file  Stress: Not on file  Social Connections: Not on file  Intimate Partner Violence: Not on file    Past Surgical History:  Procedure Laterality Date  . CORONARY CALCIUM SCORE / CARDIAC CT ANGIOGRAM  11/16/2019   Cor Ca++ Score ZERO. NO evidence of CAD. Dominant RCA-<PDA & PLVB with minimal (<25%) calcified plaque. LM no plaque -< LAD & LCx. LAD w/ large D1 - no plaque. Non-dom LCx with 1 Large OM1 - no plaque. Normal PV drainage. Normal LAA & PA size.   [name] FOOT SURGERY     bilateral foot  . NM MYOVIEW LTD  07/2007   Normal nuclear stress test.  Mild fixed defect in the anterior wall likely related to soft tissue attenuation.  Normal EF.  Fair exercise capacity.  [name] PROSTATE BIOPSY  06/13/2012   right lat mid gleason 3+3=6  . PROSTATECTOMY  2016  . TOTAL HIP ARTHROPLASTY  2008 &2007   Right and left  . TRANSTHORACIC ECHOCARDIOGRAM  09/2017   EF 55-60%. ??  Normal diastolic function.  No R WMA.  Moderate pulmonary hypertension-PA P estimated 59 mmHg.  [name] TRANSTHORACIC ECHOCARDIOGRAM  11/13/2019    EF  55-60%. NO RWMA. Mild LV dilation & Gr 1 DD. MIld LA dilation. Normal valves. Normal CVP.    Family History  Problem Relation Age of Onset  . Hypertension Mother   . Colon cancer Mother   . Obesity Mother   . Prostate cancer Father 8       alive now age 66  . Asthma Father   . Heart disease Father        Pt is not sure of details (no MI)  . Alcoholism Father   . Obesity Father   . Lung cancer Maternal Grandmother        non-smoker  . Tuberculosis Brother     Allergies  Allergen Reactions  . Codeine     REACTION: emesis  .  Cozaar [Losartan Potassium] Hives    Current Outpatient Medications on File Prior to Visit  Medication Sig Dispense Refill  . Ascorbic Acid (VITAMIN C) 1000 MG tablet Take 1,000 mg by mouth daily.    [name] atenolol-chlorthalidone (TENORETIC) 50-25 MG tablet TAKE 1 TABLET BY MOUTH EVERY DAY 90 tablet 2  . potassium chloride (KLOR-CON) 10 MEQ tablet Take 1 tablet (10 mEq total) by mouth daily. 30 tablet 0  . Vitamin D, Ergocalciferol, (DRISDOL) 1.25 MG (50000 UNIT) CAPS capsule TAKE ONE CAPSULE BY MOUTH EVERY TWO WEEKS 2 capsule 0   No current facility-administered medications on file prior to visit.    BP (!) 130/92   Temp 97.6 F (36.4 C) (Oral)   Ht 5\' 11"  (1.803 m) Comment: WITH SHOES  Wt 292 lb (132.5 kg)   BMI 40.73 kg/m       Objective:   Physical Exam Vitals and nursing note reviewed.  Constitutional:      General: He is not in acute distress.    Appearance: Normal appearance. He is well-developed. He is obese.  HENT:     Head: Normocephalic and atraumatic.     Right Ear: Tympanic membrane, ear canal and external ear normal. There is no impacted cerumen.     Left Ear: Tympanic membrane, ear canal and external ear normal. There is no impacted cerumen.     Nose: Nose normal. No congestion or rhinorrhea.     Mouth/Throat:     Mouth: Mucous membranes are moist.     Pharynx: Oropharynx is clear. No oropharyngeal exudate or posterior  oropharyngeal erythema.  Eyes:     General:        Right eye: No discharge.        Left eye: No discharge.     Extraocular Movements: Extraocular movements intact.     Conjunctiva/sclera: Conjunctivae normal.     Pupils: Pupils are equal, round, and reactive to light.  Neck:     Vascular: No carotid bruit.     Trachea: No tracheal deviation.  Cardiovascular:     Rate and Rhythm: Normal rate and regular rhythm.     Pulses: Normal pulses.     Heart sounds: Normal heart sounds. No murmur heard. No friction rub. No gallop.   Pulmonary:     Effort: Pulmonary effort is normal. No respiratory distress.     Breath sounds: Normal breath sounds. No stridor. No wheezing, rhonchi or rales.  Chest:     Chest wall: No tenderness.  Abdominal:     General: Bowel sounds are normal. There is no distension.     Palpations: Abdomen is soft. There is no mass.     Tenderness: There is no abdominal tenderness. There is no right CVA tenderness, left CVA tenderness, guarding or rebound.     Hernia: No hernia is present.  Musculoskeletal:        General: Tenderness (right groin ) present. No swelling, deformity or signs of injury. Normal range of motion.     Right lower leg: No edema.     Left lower leg: No edema.  Lymphadenopathy:     Cervical: No cervical adenopathy.  Skin:    General: Skin is warm and dry.     Capillary Refill: Capillary refill takes less than 2 seconds.     Coloration: Skin is not jaundiced or pale.     Findings: No bruising, erythema, lesion or rash.  Neurological:     General: No focal deficit present.  Mental Status: He is alert and oriented to person, place, and time.     Cranial Nerves: No cranial nerve deficit.     Sensory: No sensory deficit.     Motor: No weakness.     Coordination: Coordination normal.     Gait: Gait normal.     Deep Tendon Reflexes: Reflexes normal.  Psychiatric:        Mood and Affect: Mood normal.        Behavior: Behavior normal.         Thought Content: Thought content normal.        Judgment: Judgment normal.       Assessment & Plan:  1. Routine general medical examination at a health care facility - Follow up in one year or sooner if needed - Continue to eat healthy. Increase exercise  - CBC with Differential/Platelet - Comprehensive metabolic panel - TSH  2. Vitamin D deficiency - Continue with vitamin D supplementation   3. Essential hypertension - Will trial him on Norvasc 2.5 mg to see if we can bring down his BP. Follow up if swelling develops  - CBC with Differential/Platelet - Comprehensive metabolic panel - TSH - amLODipine (NORVASC) 2.5 MG tablet; Take 1 tablet (2.5 mg total) by mouth daily.  Dispense: 90 tablet; Refill: 0  4. OSA on CPAP - Follow up with pulmonary as directed  5. Hyperlipidemia due to dietary fat intake - Lipid panel done by weight loss clinic in November. No need to repeat at this time  - CBC with Differential/Platelet - Comprehensive metabolic panel - TSH  6. Prediabetes  - CBC with Differential/Platelet - Comprehensive metabolic panel - TSH - Hemoglobin A1c  7. Class 2 severe obesity with serious comorbidity and body mass index (BMI) of 39.0 to 39.9 in adult, unspecified obesity type ([facility])  - CBC with Differential/Platelet - Comprehensive metabolic panel - TSH  8. Other secondary acute gout, unspecified site - Consider allopurinol. Reviewed foods that can cause gout flares - Uric Acid  Dorothyann Peng, NP

## 2020-05-22 ENCOUNTER — Ambulatory Visit (INDEPENDENT_AMBULATORY_CARE_PROVIDER_SITE_OTHER): Payer: Medicare Other | Admitting: Bariatrics

## 2020-05-22 ENCOUNTER — Encounter (INDEPENDENT_AMBULATORY_CARE_PROVIDER_SITE_OTHER): Payer: Self-pay | Admitting: Bariatrics

## 2020-05-22 ENCOUNTER — Other Ambulatory Visit: Payer: Self-pay

## 2020-05-22 VITALS — BP 138/87 | HR 50 | Temp 97.8°F | Ht 71.0 in | Wt 283.0 lb

## 2020-05-22 DIAGNOSIS — E559 Vitamin D deficiency, unspecified: Secondary | ICD-10-CM | POA: Diagnosis not present

## 2020-05-22 DIAGNOSIS — Z6839 Body mass index (BMI) 39.0-39.9, adult: Secondary | ICD-10-CM

## 2020-05-22 DIAGNOSIS — E876 Hypokalemia: Secondary | ICD-10-CM

## 2020-05-22 MED ORDER — VITAMIN D (ERGOCALCIFEROL) 1.25 MG (50000 UNIT) PO CAPS: ORAL_CAPSULE | ORAL | 0 refills | Status: DC

## 2020-05-22 MED ORDER — POTASSIUM CHLORIDE ER 10 MEQ PO TBCR: 10.0000 meq | EXTENDED_RELEASE_TABLET | Freq: Every day | ORAL | 0 refills | Status: DC

## 2020-05-26 DIAGNOSIS — M8588 Other specified disorders of bone density and structure, other site: Secondary | ICD-10-CM | POA: Diagnosis not present

## 2020-05-26 DIAGNOSIS — R109 Unspecified abdominal pain: Secondary | ICD-10-CM | POA: Diagnosis not present

## 2020-05-26 NOTE — Progress Notes (Signed)
Chief Complaint:   OBESITY Jonathan [name] is here to discuss his progress with his obesity treatment plan along with follow-up of his obesity related diagnoses. Jonathan [name] is on the [employer] and states he is following his eating plan approximately 35% of the time. Jonathan [name] states he is doing 0 minutes 0 times per week.  Today's visit was #: 33 Starting weight: 319 lbs Starting date: 08/28/2018 Today's weight: 283 lbs Today's date: 05/22/2020 Total lbs lost to date: 36 Total lbs lost since last in-office visit: 9  Interim History: Jonathan [name] is down 9 lbs since his last visit. He is doing well with his water intake.  Subjective:   1. [name]'s last Vit D level was 42.5. He is taking Vit D as directed.  2. [name]'s last potassium level was 3.7. He is taking his medication as directed.  Assessment/Plan:   1. Vitamin D deficiency Low Vitamin D level contributes to fatigue and are associated with obesity, breast, and colon cancer. We will refill prescription Vitamin D for 1 month. Jonathan [name] will follow-up for routine testing of Vitamin D, at least 2-3 times per year to avoid over-replacement.  - Vitamin D, Ergocalciferol, (DRISDOL) 1.25 MG (50000 UNIT) CAPS capsule; TAKE ONE CAPSULE BY MOUTH EVERY TWO WEEKS  Dispense: 2 capsule; Refill: 0  2. [name] will refill Klor-con for 1 month. Jonathan [name] will continue to follow up as directed.  - potassium chloride (KLOR-CON) 10 MEQ tablet; Take 1 tablet (10 mEq total) by mouth daily.  Dispense: 30 tablet; Refill: 0  3. Class 2 severe obesity with serious comorbidity and body mass index (BMI) of 39.0 to 39.9 in adult, unspecified obesity type (HCC) Jonathan [name] is currently in the action stage of change. As such, his goal is to continue with weight loss efforts. He has agreed to the [employer].   We discussed mindful eating.  Exercise goals: No exercise has been prescribed at this time.  Behavioral modification  strategies: increasing lean protein intake, decreasing simple carbohydrates, increasing vegetables, increasing water intake, decreasing eating out, no skipping meals, meal planning and cooking strategies, keeping healthy foods in the home and planning for success.  Jonathan [name] has agreed to follow-up with our clinic in 2 to 3 weeks. He was informed of the importance of frequent follow-up visits to maximize his success with intensive lifestyle modifications for his multiple health conditions.   Objective:   Blood pressure 138/87, pulse (!) 50, temperature 97.8 F (36.6 C), height 5\' 11"  (1.803 m), weight 283 lb (128.4 kg), SpO2 96 %. Body mass index is 39.47 kg/m.  General: Cooperative, alert, well developed, in no acute distress. HEENT: Conjunctivae and lids unremarkable. Cardiovascular: Regular rhythm.  Lungs: Normal work of breathing. Neurologic: No focal deficits.   Lab Results  Component Value Date   CREATININE 0.88 05/20/2020   BUN 14 05/20/2020   NA 135 05/20/2020   K 3.7 05/20/2020   CL 92 (L) 05/20/2020   CO2 37 (H) 05/20/2020   Lab Results  Component Value Date   ALT 12 05/20/2020   AST 16 05/20/2020   ALKPHOS 60 05/20/2020   BILITOT 0.9 05/20/2020   Lab Results  Component Value Date   HGBA1C 6.0 05/20/2020   HGBA1C 5.9 (H) 03/10/2020   HGBA1C 5.7 (H) 09/27/2019   HGBA1C 5.9 05/09/2019   HGBA1C 6.1 (H) 01/04/2019   Lab Results  Component Value Date   INSULIN 19.1 03/10/2020   INSULIN 11.2 09/27/2019   INSULIN 46.3 (  H) 08/28/2018   Lab Results  Component Value Date   TSH 1.59 05/20/2020   Lab Results  Component Value Date   CHOL 164 03/10/2020   HDL 48 03/10/2020   LDLCALC 101 (H) 03/10/2020   TRIG 81 03/10/2020   CHOLHDL 4 05/09/2019   Lab Results  Component Value Date   WBC 7.3 05/20/2020   HGB 13.8 05/20/2020   HCT 41.6 05/20/2020   MCV 80.0 05/20/2020   PLT 198.0 05/20/2020   Lab Results  Component Value Date   IRON 150 12/15/2011    FERRITIN 9.2 (L) 12/15/2011    Obesity Behavioral Intervention:   Approximately 15 minutes were spent on the discussion below.  ASK: We discussed the diagnosis of obesity with Jonathan [name] today and Jonathan [name] agreed to give [name] permission to discuss obesity behavioral modification therapy today.  ASSESS: Jonathan [name] has the diagnosis of obesity and his BMI today is 39.49. Jonathan [name] is in the action stage of change.   ADVISE: Jonathan [name] was educated on the multiple health risks of obesity as well as the benefit of weight loss to improve his health. He was advised of the need for long term treatment and the importance of lifestyle modifications to improve his current health and to decrease his risk of future health problems.  AGREE: Multiple dietary modification options and treatment options were discussed and Jonathan [name] agreed to follow the recommendations documented in the above note.  ARRANGE: Jonathan [name] was educated on the importance of frequent visits to treat obesity as outlined per CMS and USPSTF guidelines and agreed to schedule his next follow up appointment today.  Attestation Statements:   Reviewed by clinician on day of visit: allergies, medications, problem list, medical history, surgical history, family history, social history, and previous encounter notes.   Wilhemena Durie, am acting as Location manager for [employer], DO.  I have reviewed the above documentation for accuracy and completeness, and I agree with the above. [name], DO

## 2020-05-28 ENCOUNTER — Encounter (INDEPENDENT_AMBULATORY_CARE_PROVIDER_SITE_OTHER): Payer: Self-pay | Admitting: Bariatrics

## 2020-06-05 ENCOUNTER — Ambulatory Visit (INDEPENDENT_AMBULATORY_CARE_PROVIDER_SITE_OTHER): Payer: Medicare Other | Admitting: Bariatrics

## 2020-06-12 ENCOUNTER — Ambulatory Visit (INDEPENDENT_AMBULATORY_CARE_PROVIDER_SITE_OTHER): Payer: Medicare Other | Admitting: Bariatrics

## 2020-06-12 ENCOUNTER — Encounter (INDEPENDENT_AMBULATORY_CARE_PROVIDER_SITE_OTHER): Payer: Self-pay | Admitting: Bariatrics

## 2020-06-12 ENCOUNTER — Other Ambulatory Visit: Payer: Self-pay

## 2020-06-12 VITALS — BP 118/76 | HR 51 | Temp 98.0°F | Ht 71.0 in | Wt 282.0 lb

## 2020-06-12 DIAGNOSIS — E876 Hypokalemia: Secondary | ICD-10-CM

## 2020-06-12 DIAGNOSIS — Z9189 Other specified personal risk factors, not elsewhere classified: Secondary | ICD-10-CM

## 2020-06-12 DIAGNOSIS — E559 Vitamin D deficiency, unspecified: Secondary | ICD-10-CM

## 2020-06-12 DIAGNOSIS — I10 Essential (primary) hypertension: Secondary | ICD-10-CM

## 2020-06-12 DIAGNOSIS — Z6839 Body mass index (BMI) 39.0-39.9, adult: Secondary | ICD-10-CM

## 2020-06-12 MED ORDER — VITAMIN D (ERGOCALCIFEROL) 1.25 MG (50000 UNIT) PO CAPS: ORAL_CAPSULE | ORAL | 0 refills | Status: DC

## 2020-06-12 MED ORDER — POTASSIUM CHLORIDE ER 10 MEQ PO TBCR: 10.0000 meq | EXTENDED_RELEASE_TABLET | Freq: Every day | ORAL | 0 refills | Status: DC

## 2020-06-14 ENCOUNTER — Other Ambulatory Visit (INDEPENDENT_AMBULATORY_CARE_PROVIDER_SITE_OTHER): Payer: Self-pay | Admitting: Bariatrics

## 2020-06-14 DIAGNOSIS — E876 Hypokalemia: Secondary | ICD-10-CM

## 2020-06-14 DIAGNOSIS — E559 Vitamin D deficiency, unspecified: Secondary | ICD-10-CM

## 2020-06-16 ENCOUNTER — Encounter (INDEPENDENT_AMBULATORY_CARE_PROVIDER_SITE_OTHER): Payer: Self-pay | Admitting: Bariatrics

## 2020-06-16 NOTE — Telephone Encounter (Signed)
Dr.Brown 

## 2020-06-16 NOTE — Progress Notes (Signed)
Chief Complaint:   OBESITY Jonathan [name] is here to discuss his progress with his obesity treatment plan along with follow-up of his obesity related diagnoses. Jonathan [name] is on the [employer] and states Jonathan is following his eating plan approximately 75% of the time. Jonathan [name] states Jonathan is lifting weights and walking for 30 minutes 3-4 times per week.  Today's visit was #: 61 Starting weight: 319 lbs Starting date: 08/28/2018 Today's weight: 282 lbs Today's date: 06/12/2020 Total lbs lost to date: 37 lbs Total lbs lost since last in-office visit: 1 lb  Interim History: Jonathan [name] is down 1 pound since his last visit.  Jonathan states that Jonathan is trying to find a balance.  Subjective:   1. [name]'s Vitamin D level was 42.5 on 03/10/2020. Jonathan is currently taking prescription vitamin D 50,000 IU every 2 weeks. Jonathan denies nausea, vomiting or muscle weakness.  Jonathan gets minimal sunlight exposure.  2. Hypokalemia Denies adverse effects.  Lab Results  Component Value Date   K 3.7 05/20/2020   3. Essential hypertension Controlled.  Review: taking medications as instructed, no medication side effects noted, no chest pain on exertion, no dyspnea on exertion, no swelling of ankles.    BP Readings from Last 3 Encounters:  06/12/20 118/76  05/22/20 138/87  05/20/20 (!) 130/92   Assessment/Plan:   1. Vitamin D deficiency Low Vitamin D level contributes to fatigue and are associated with obesity, breast, and colon cancer. Jonathan agrees to continue to take prescription Vitamin D @50 ,000 IU every week and will follow-up for routine testing of Vitamin D, at least 2-3 times per year to avoid over-replacement.  - Refill Vitamin D, Ergocalciferol, (DRISDOL) 1.25 MG (50000 UNIT) CAPS capsule; TAKE ONE CAPSULE BY MOUTH EVERY TWO WEEKS  Dispense: 2 capsule; Refill: 0  2. Hypokalemia Will start Klor-Con 10 mEq daily, as per below.  - Start potassium chloride (KLOR-CON) 10 MEQ tablet; Take 1 tablet  (10 mEq total) by mouth daily.  Dispense: 30 tablet; Refill: 0  3. Essential hypertension Jonathan [name] is working on healthy weight loss and exercise to improve blood pressure control. We will watch for signs of hypotension as Jonathan continues his lifestyle modifications.  Continue diet and exercise.  4. Class 2 severe obesity with serious comorbidity and body mass index (BMI) of 39.0 to 39.9 in adult, unspecified obesity type (HCC)  Jonathan [name] is currently in the action stage of change. As such, his goal is to continue with weight loss efforts. Jonathan has [name] to the [employer].   Jonathan will work on meal planning, will continue to adhere closely to the plan, and will have a protein shake.  Exercise goals: Walking.  Using a new watch to record.  Behavioral modification strategies: increasing lean protein intake, decreasing simple carbohydrates, increasing vegetables, increasing water intake, decreasing eating out, no skipping meals, meal planning and cooking strategies, keeping healthy foods in the home and planning for success.  Jonathan [name] has [name] to follow-up with our clinic in 3 weeks. Jonathan was informed of the importance of frequent follow-up visits to maximize his success with intensive lifestyle modifications for his multiple health conditions.   Objective:   Blood pressure 118/76, pulse (!) 51, temperature 98 F (36.7 C), height 5\' 11"  (1.803 m), weight 282 lb (127.9 kg), SpO2 96 %. Body mass index is 39.33 kg/m.  General: Cooperative, alert, well developed, in no acute distress. HEENT: Conjunctivae and lids unremarkable. Cardiovascular: Regular rhythm.  Lungs: Normal work of  breathing. Neurologic: No focal deficits.   Lab Results  Component Value Date   CREATININE 0.88 05/20/2020   BUN 14 05/20/2020   NA 135 05/20/2020   K 3.7 05/20/2020   CL 92 (L) 05/20/2020   CO2 37 (H) 05/20/2020   Lab Results  Component Value Date   ALT 12 05/20/2020   AST 16 05/20/2020   ALKPHOS 60 05/20/2020    BILITOT 0.9 05/20/2020   Lab Results  Component Value Date   HGBA1C 6.0 05/20/2020   HGBA1C 5.9 (H) 03/10/2020   HGBA1C 5.7 (H) 09/27/2019   HGBA1C 5.9 05/09/2019   HGBA1C 6.1 (H) 01/04/2019   Lab Results  Component Value Date   INSULIN 19.1 03/10/2020   INSULIN 11.2 09/27/2019   INSULIN 46.3 (H) 08/28/2018   Lab Results  Component Value Date   TSH 1.59 05/20/2020   Lab Results  Component Value Date   CHOL 164 03/10/2020   HDL 48 03/10/2020   LDLCALC 101 (H) 03/10/2020   TRIG 81 03/10/2020   CHOLHDL 4 05/09/2019   Lab Results  Component Value Date   WBC 7.3 05/20/2020   HGB 13.8 05/20/2020   HCT 41.6 05/20/2020   MCV 80.0 05/20/2020   PLT 198.0 05/20/2020   Lab Results  Component Value Date   IRON 150 12/15/2011   FERRITIN 9.2 (L) 12/15/2011   Obesity Behavioral Intervention:   Approximately 15 minutes were spent on the discussion below.  ASK: We discussed the diagnosis of obesity with Jonathan [name] today and Jonathan [name] [name] to give [name] permission to discuss obesity behavioral modification therapy today.  ASSESS: Jonathan [name] has the diagnosis of obesity and his BMI today is 39.4. Jonathan [name] is in the action stage of change.   ADVISE: Jonathan [name] was educated on the multiple health risks of obesity as well as the benefit of weight loss to improve his health. Jonathan was advised of the need for long term treatment and the importance of lifestyle modifications to improve his current health and to decrease his risk of future health problems.  AGREE: Multiple dietary modification options and treatment options were discussed and Jonathan [name] [name] to follow the recommendations documented in the above note.  ARRANGE: Jonathan [name] was educated on the importance of frequent visits to treat obesity as outlined per CMS and USPSTF guidelines and [name] to schedule his next follow up appointment today.  Attestation Statements:   Reviewed by clinician on day of visit: allergies, medications, problem list,  medical history, surgical history, family history, social history, and previous encounter notes.  I, Water quality scientist, CMA, am acting as Location manager for [employer], DO  I have reviewed the above documentation for accuracy and completeness, and I agree with the above. [name], DO

## 2020-06-26 ENCOUNTER — Telehealth: Payer: Self-pay | Admitting: Adult Health

## 2020-06-26 NOTE — Telephone Encounter (Signed)
Left message for patient to call back and schedule Medicare Annual Wellness Visit (AWV) either virtually or in office. No detailed message 06/26/20   AWVI  please schedule at anytime with [facility] Nurse Health Advisor 1 or 2   This should be a 45 minute visit.

## 2020-07-01 IMAGING — CT CT HEART MORP W/ CTA COR W/ SCORE W/ CA W/CM &/OR W/O CM
3 of 8 series · 6 of 20 positions shown, 7 images · IV contrast (APPLIED)
Comparison: None.

CLINICAL DATA: 61-year-old male with HTN, HLP and NUSSA.

EXAM:
Cardiac/Coronary  CT
TECHNIQUE: The patient was scanned on a [employer] Force scanner.

[Series 13: best syst 42 % · axial · 0.41mm/px · z∈[-236,-177]mm · 2 of 448 slices shown, 3 images]
[im 150/448  vessel]
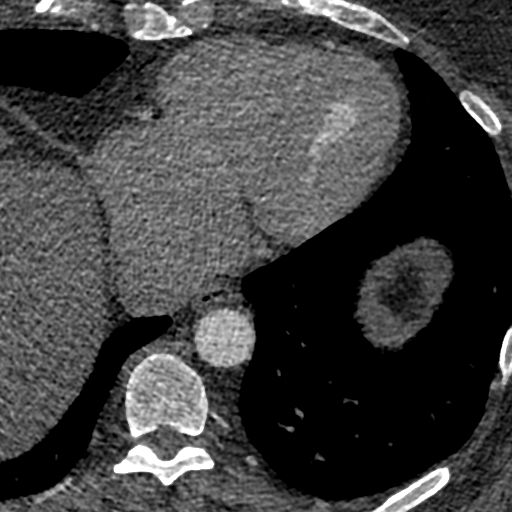
[im 150/448  lung]
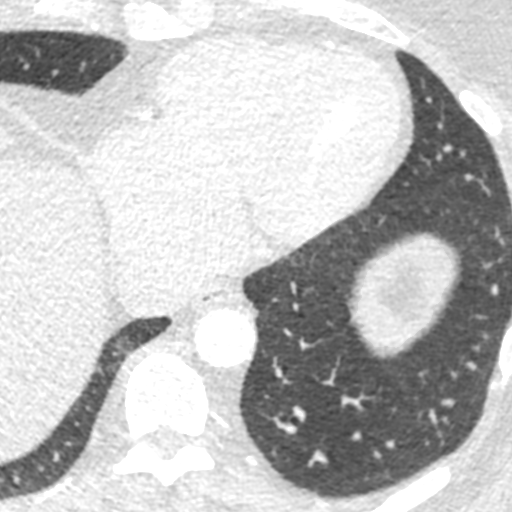
[im 299/448  vessel]
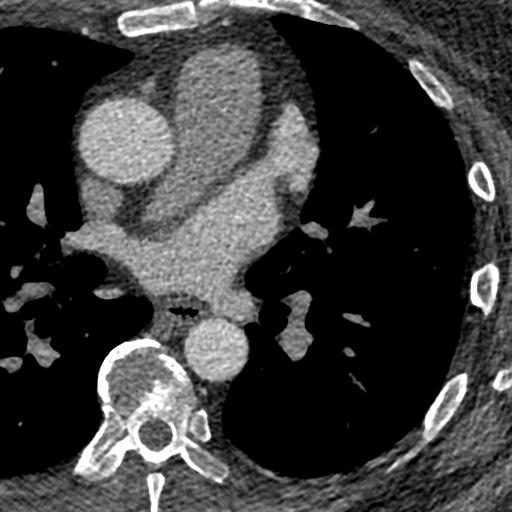

[Series 14: ts diast sharp 65 % · axial · 0.41mm/px · z∈[-236,-177]mm · 2 of 448 slices shown]
[im 150/448  lung]
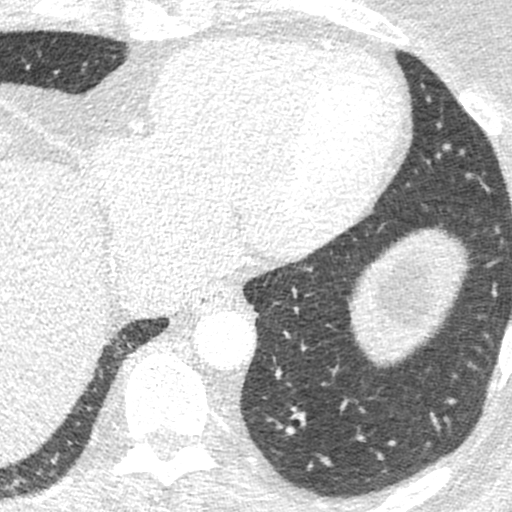
[im 299/448  lung]
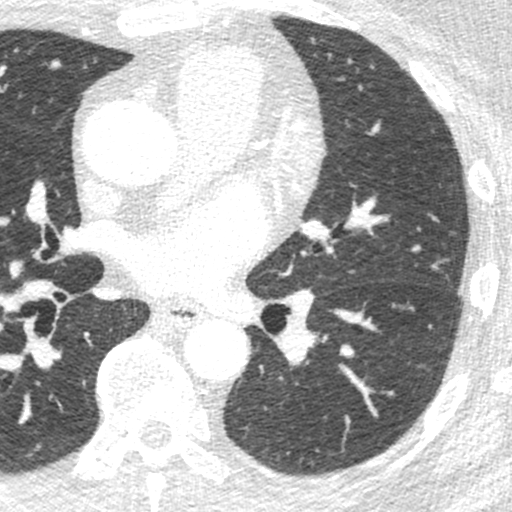

[Series 15: ts syst sharp 42 % · axial · 0.41mm/px · z∈[-236,-177]mm · 2 of 448 slices shown]
[im 150/448  lung]
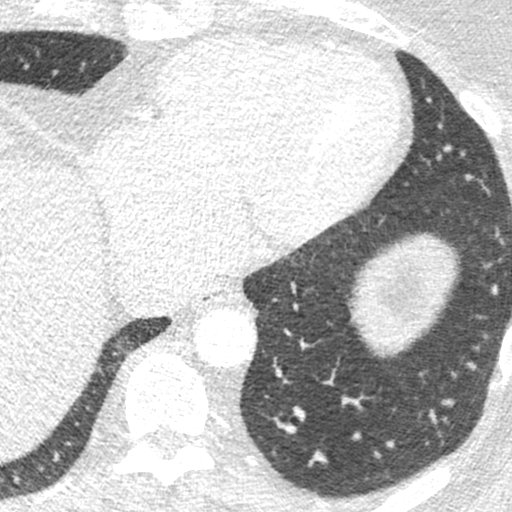
[im 299/448  lung]
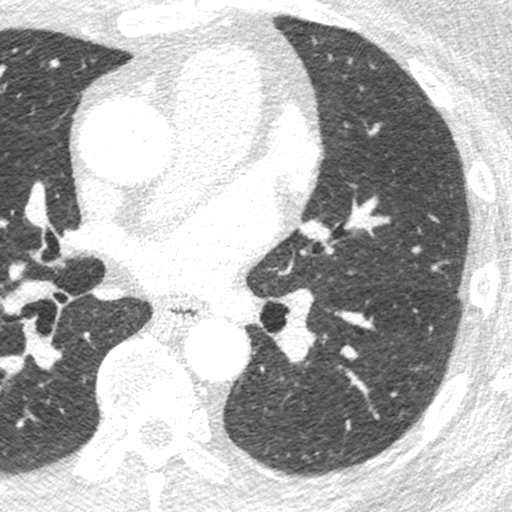

[6 of 20 positions shown; findings below may reference images not displayed]



Aorta:  Normal size.  No calcifications.  No dissection.

Aortic Valve:  Trileaflet.  No calcifications.

Coronary Arteries:  Normal coronary origin.  Right dominance.

RCA is a large dominant artery that gives rise to PDA and PLVB.
There is mild non-calcified plaque in the mid RCA.

Left main is a large artery that gives rise to LAD, ramus
intermedius and LCX arteries. Left main has no plaque.

LAD is a large vessel that gives rise to one diagonal artery. There
is a long intramyocardial bridge in the mid LAD.

RI is a medium size artery with no significant plaque.

LCX is a non-dominant artery that gives rise to one large OM1
branch. There is no plaque.

Other findings:

Normal pulmonary vein drainage into the left atrium.

Normal let atrial appendage without a thrombus.
IMPRESSION: 1. Coronary calcium score of 0. This was 0 percentile for age and
sex matched control.

2. Normal coronary origin with right dominance.

3. Study evaluation is affected by patient's size. However, there is
only mild plaque in the mid RCA and long intramyocardial bridge in
the mid LAD.

EXAM:
OVER-READ INTERPRETATION  CT CHEST

The following report is an over-read performed by radiologist Dr.
Daichi Sethu [REDACTED] on 10/24/2017. This
over-read does not include interpretation of cardiac or coronary
anatomy or pathology. The coronary calcium score/coronary CTA
interpretation by the cardiologist is attached.
FINDINGS: Dilatation of the pulmonic trunk (4.2 cm in diameter). Within the
visualized portions of the thorax there are no suspicious appearing
pulmonary nodules or masses, there is no acute consolidative
airspace disease, no pleural effusions, no pneumothorax and no
lymphadenopathy. Visualized portions of the upper abdomen are
unremarkable. There are no aggressive appearing lytic or blastic
lesions noted in the visualized portions of the skeleton.
IMPRESSION: 1. Dilatation of the pulmonic trunk (4.2 cm in diameter), concerning
for pulmonary arterial hypertension.

## 2020-07-02 ENCOUNTER — Ambulatory Visit: Payer: Medicare Other | Admitting: Cardiology

## 2020-07-03 ENCOUNTER — Other Ambulatory Visit: Payer: Self-pay

## 2020-07-03 ENCOUNTER — Ambulatory Visit (INDEPENDENT_AMBULATORY_CARE_PROVIDER_SITE_OTHER): Payer: Medicare Other | Admitting: Bariatrics

## 2020-07-03 ENCOUNTER — Encounter (INDEPENDENT_AMBULATORY_CARE_PROVIDER_SITE_OTHER): Payer: Self-pay | Admitting: Bariatrics

## 2020-07-03 VITALS — BP 135/85 | HR 56 | Temp 97.5°F | Ht 71.0 in | Wt 281.0 lb

## 2020-07-03 DIAGNOSIS — I10 Essential (primary) hypertension: Secondary | ICD-10-CM | POA: Diagnosis not present

## 2020-07-03 DIAGNOSIS — Z6839 Body mass index (BMI) 39.0-39.9, adult: Secondary | ICD-10-CM | POA: Diagnosis not present

## 2020-07-03 DIAGNOSIS — E559 Vitamin D deficiency, unspecified: Secondary | ICD-10-CM

## 2020-07-03 MED ORDER — VITAMIN D (ERGOCALCIFEROL) 1.25 MG (50000 UNIT) PO CAPS: ORAL_CAPSULE | ORAL | 0 refills | Status: DC

## 2020-07-08 ENCOUNTER — Encounter (INDEPENDENT_AMBULATORY_CARE_PROVIDER_SITE_OTHER): Payer: Self-pay | Admitting: Bariatrics

## 2020-07-08 NOTE — Progress Notes (Signed)
Chief Complaint:   OBESITY Jonathan [name] is here to discuss his progress with his obesity treatment plan along with follow-up of his obesity related diagnoses. Jonathan [name] is on the [employer] and states he is following his eating plan approximately 100% of the time. Jonathan [name] states he is doing cardio and strengthening for 60 minutes 3 times per week.  Today's visit was #: 46 Starting weight: 319 lbs Starting date: 08/28/2018 Today's weight: 281 lbs Today's date: 07/03/2020 Total lbs lost to date: 38 Total lbs lost since last in-office visit: 1  Interim History: Jonathan [name] is down 1 lb. He may only eat 2 times per day. He is drinking a protein shake.  Subjective:   1. [name] denies muscle weakness, nausea, or vomiting on Vit D.  2. Essential hypertension Jonathan [name]'s blood pressure is reasonably well controlled.  Assessment/Plan:   1. Vitamin D deficiency Low Vitamin D level contributes to fatigue and are associated with obesity, breast, and colon cancer. We will refill prescription Vitamin D for 1 month. Jonathan [name] will follow-up for routine testing of Vitamin D, at least 2-3 times per year to avoid over-replacement.  - Vitamin D, Ergocalciferol, (DRISDOL) 1.25 MG (50000 UNIT) CAPS capsule; TAKE ONE CAPSULE BY MOUTH EVERY TWO WEEKS  Dispense: 2 capsule; Refill: 0  2. Essential hypertension Jonathan [name] will continue his medications, and will continue working on healthy weight loss and exercise to improve blood pressure control. We will watch for signs of hypotension as he continues his lifestyle modifications.  3. Class 2 severe obesity due to excess calories with serious comorbidity and body mass index (BMI) of 39.0 to 39.9 in adult [facility]) Jonathan [name] is currently in the action stage of change. As such, his goal is to continue with weight loss efforts. He has agreed to the [employer].   Jonathan [name] will adhere closely to the plan.  Exercise goals: As is.  Behavioral modification  strategies: increasing lean protein intake, decreasing simple carbohydrates, increasing vegetables, increasing water intake, decreasing eating out, no skipping meals, meal planning and cooking strategies, keeping healthy foods in the home and planning for success.  Jonathan [name] has agreed to follow-up with our clinic in 2 to 3 weeks. He was informed of the importance of frequent follow-up visits to maximize his success with intensive lifestyle modifications for his multiple health conditions.   Objective:   Blood pressure 135/85, pulse (!) 56, temperature (!) 97.5 F (36.4 C), height 5\' 11"  (1.803 m), weight 281 lb (127.5 kg), SpO2 97 %. Body mass index is 39.19 kg/m.  General: Cooperative, alert, well developed, in no acute distress. HEENT: Conjunctivae and lids unremarkable. Cardiovascular: Regular rhythm.  Lungs: Normal work of breathing. Neurologic: No focal deficits.   Lab Results  Component Value Date   CREATININE 0.88 05/20/2020   BUN 14 05/20/2020   NA 135 05/20/2020   K 3.7 05/20/2020   CL 92 (L) 05/20/2020   CO2 37 (H) 05/20/2020   Lab Results  Component Value Date   ALT 12 05/20/2020   AST 16 05/20/2020   ALKPHOS 60 05/20/2020   BILITOT 0.9 05/20/2020   Lab Results  Component Value Date   HGBA1C 6.0 05/20/2020   HGBA1C 5.9 (H) 03/10/2020   HGBA1C 5.7 (H) 09/27/2019   HGBA1C 5.9 05/09/2019   HGBA1C 6.1 (H) 01/04/2019   Lab Results  Component Value Date   INSULIN 19.1 03/10/2020   INSULIN 11.2 09/27/2019   INSULIN 46.3 (H) 08/28/2018   Lab Results  Component  Value Date   TSH 1.59 05/20/2020   Lab Results  Component Value Date   CHOL 164 03/10/2020   HDL 48 03/10/2020   LDLCALC 101 (H) 03/10/2020   TRIG 81 03/10/2020   CHOLHDL 4 05/09/2019   Lab Results  Component Value Date   WBC 7.3 05/20/2020   HGB 13.8 05/20/2020   HCT 41.6 05/20/2020   MCV 80.0 05/20/2020   PLT 198.0 05/20/2020   Lab Results  Component Value Date   IRON 150 12/15/2011    FERRITIN 9.2 (L) 12/15/2011    Obesity Behavioral Intervention:   Approximately 15 minutes were spent on the discussion below.  ASK: We discussed the diagnosis of obesity with Jonathan [name] today and Jonathan [name] agreed to give [name] permission to discuss obesity behavioral modification therapy today.  ASSESS: Jonathan [name] has the diagnosis of obesity and his BMI today is 39.21. Jonathan [name] is in the action stage of change.   ADVISE: Jonathan [name] was educated on the multiple health risks of obesity as well as the benefit of weight loss to improve his health. He was advised of the need for long term treatment and the importance of lifestyle modifications to improve his current health and to decrease his risk of future health problems.  AGREE: Multiple dietary modification options and treatment options were discussed and Jonathan [name] agreed to follow the recommendations documented in the above note.  ARRANGE: Jonathan [name] was educated on the importance of frequent visits to treat obesity as outlined per CMS and USPSTF guidelines and agreed to schedule his next follow up appointment today.  Attestation Statements:   Reviewed by clinician on day of visit: allergies, medications, problem list, medical history, surgical history, family history, social history, and previous encounter notes.   Wilhemena Durie, am acting as Location manager for [employer], DO.  I have reviewed the above documentation for accuracy and completeness, and I agree with the above. [name], DO

## 2020-07-10 ENCOUNTER — Other Ambulatory Visit (INDEPENDENT_AMBULATORY_CARE_PROVIDER_SITE_OTHER): Payer: Self-pay | Admitting: Bariatrics

## 2020-07-10 DIAGNOSIS — E876 Hypokalemia: Secondary | ICD-10-CM

## 2020-07-11 NOTE — Progress Notes (Signed)
Subjective:   Jonathan [name] is a 65 y.o. male who presents for an Initial Medicare Annual Wellness Visit.  Review of Systems    N/A Cardiac Risk Factors include: advanced age (>9men, >45 women);hypertension;male gender;dyslipidemia     Objective:    Today's Vitals   07/14/20 0828  BP: 132/80  Pulse: (!) 52  Temp: 97.8 F (36.6 C)  SpO2: 98%  Weight: 290 lb (131.5 kg)   Body mass index is 40.45 kg/m.  Advanced Directives 07/14/2020 07/20/2019 12/26/2017 10/02/2015  Does Patient Have a Medical Advance Directive? Yes No Yes Yes  Type of Advance Directive Living will - Living will [place];Living will  Does patient want to make changes to medical advance directive? - - - No - Patient declined  Copy of [place] in Chart? - - - No - copy requested  Would patient like information on creating a medical advance directive? - No - Patient declined - -    Current Medications (verified) Outpatient Encounter Medications as of 07/14/2020  Medication Sig  . Ascorbic Acid (VITAMIN C) 1000 MG tablet Take 1,000 mg by mouth daily.  [name] atenolol-chlorthalidone (TENORETIC) 50-25 MG tablet TAKE 1 TABLET BY MOUTH EVERY DAY  . potassium chloride (KLOR-CON) 10 MEQ tablet Take 1 tablet (10 mEq total) by mouth daily.  . Vitamin D, Ergocalciferol, (DRISDOL) 1.25 MG (50000 UNIT) CAPS capsule TAKE ONE CAPSULE BY MOUTH EVERY TWO WEEKS  . amLODipine (NORVASC) 2.5 MG tablet Take 1 tablet (2.5 mg total) by mouth daily. (Patient not taking: Reported on 07/14/2020)   No facility-administered encounter medications on file as of 07/14/2020.    Allergies (verified) Codeine, Cozaar [losartan potassium], and Norvasc [amlodipine]   History: Past Medical History:  Diagnosis Date  . Anxiety   . Asthma   . BPH (benign prostatic hyperplasia)   . ED (erectile dysfunction)   . Elevated PSA    9.33  . Esophageal reflux   . Hearing loss   . History of hip replacement   .  History of knee replacement   . Hypertension   . Lactose intolerance   . Numbness in both legs   . OSA on CPAP 2019  . Pre-diabetes   . Prostate cancer [facility]) Jan 2016   prostatectomy  . Vitamin D deficiency    Past Surgical History:  Procedure Laterality Date  . CORONARY CALCIUM SCORE / CARDIAC CT ANGIOGRAM  11/16/2019   Cor Ca++ Score ZERO. NO evidence of CAD. Dominant RCA-<PDA & PLVB with minimal (<25%) calcified plaque. LM no plaque -< LAD & LCx. LAD w/ large D1 - no plaque. Non-dom LCx with 1 Large OM1 - no plaque. Normal PV drainage. Normal LAA & PA size.   [name] FOOT SURGERY     bilateral foot  . NM MYOVIEW LTD  07/2007   Normal nuclear stress test.  Mild fixed defect in the anterior wall likely related to soft tissue attenuation.  Normal EF.  Fair exercise capacity.  [name] PROSTATE BIOPSY  06/13/2012   right lat mid gleason 3+3=6  . PROSTATECTOMY  2016  . TOTAL HIP ARTHROPLASTY  2008 &2007   Right and left  . TRANSTHORACIC ECHOCARDIOGRAM  09/2017   EF 55-60%. ??  Normal diastolic function.  No R WMA.  Moderate pulmonary hypertension-PA P estimated 59 mmHg.  [name] TRANSTHORACIC ECHOCARDIOGRAM  11/13/2019    EF 55-60%. NO RWMA. Mild LV dilation & Gr 1 DD. MIld LA dilation. Normal valves. Normal CVP.  Family History  Problem Relation Age of Onset  . Hypertension Mother   . Colon cancer Mother   . Obesity Mother   . Prostate cancer Father 88       alive now age 70  . Asthma Father   . Heart disease Father        Pt is not sure of details (no MI)  . Alcoholism Father   . Obesity Father   . Lung cancer Maternal Grandmother        non-smoker  . Tuberculosis Brother    Social History   Socioeconomic History  . Marital status: Married    Spouse name: Not on file  . Number of children: 2  . Years of education: Not on file  . Highest education level: Not on file  Occupational History  . Occupation: Education administrator: [employer]  Tobacco Use  . Smoking status: Never Smoker  .  Smokeless tobacco: Never Used  Vaping Use  . Vaping Use: Never used  Substance and Sexual Activity  . Alcohol use: No  . Drug use: No  . Sexual activity: Not on file  Other Topics Concern  . Not on file  Social History Narrative  . Not on file   Social Determinants of Health   Financial Resource Strain: Low Risk   . Difficulty of Paying Living Expenses: Not hard at all  Food Insecurity: No Food Insecurity  . Worried About Charity fundraiser in the Last Year: Never true  . Ran Out of Food in the Last Year: Never true  Transportation Needs: No Transportation Needs  . Lack of Transportation (Medical): No  . Lack of Transportation (Non-Medical): No  Physical Activity: Not on file  Stress: No Stress Concern Present  . Feeling of Stress : Not at all  Social Connections: Socially Integrated  . Frequency of Communication with Friends and Family: More than three times a week  . Frequency of Social Gatherings with Friends and Family: More than three times a week  . Attends Religious Services: More than 4 times per year  . Active Member of Clubs or Organizations: Not on file  . Attends Archivist Meetings: More than 4 times per year  . Marital Status: Married    Tobacco Counseling Counseling given: Not Answered   Clinical Intake:  Pre-visit preparation completed: Yes  Pain : No/denies pain     Nutritional Risks: None Diabetes: No  How often do you need to have someone help you when you read instructions, pamphlets, or other written materials from your doctor or pharmacy?: 1 - Never What is the last grade level you completed in school?: college  Diabetic?no  Interpreter Needed?: No  Information entered by :: [place] of Daily Living In your present state of health, do you have any difficulty performing the following activities: 07/14/2020  Hearing? N  Vision? N  Difficulty concentrating or making decisions? N  Walking or climbing stairs? N   Dressing or bathing? N  Doing errands, shopping? N  Preparing Food and eating ? N  Using the Toilet? N  In the past six months, have you accidently leaked urine? N  Do you have problems with loss of bowel control? N  Managing your Medications? N  Managing your Finances? N  Housekeeping or managing your Housekeeping? N  Some recent data might be hidden    Patient Care Team: Dorothyann Peng, NP as PCP - General (Family Medicine) Dorothyann Peng, NP  as Nurse Practitioner (Family Medicine) [facility], [facility] any recent Medical Services you may have received from other than [facility] providers in the past year (date may be approximate).     Assessment:   This is a routine wellness examination for [place].  Hearing/Vision screen  Hearing Screening   125Hz  250Hz  500Hz  1000Hz  2000Hz  3000Hz  4000Hz  6000Hz  8000Hz   Right ear:           Left ear:           Vision Screening Comments: Annual eye exams   Dietary issues and exercise activities discussed: Current Exercise Habits: Home exercise routine, Type of exercise: walking, Time (Minutes): 30, Frequency (Times/Week): 3, Weekly Exercise (Minutes/Week): 90, Intensity: Mild, Exercise limited by: orthopedic condition(s)  Goals    . Develop a Weight Loss Readiness Plan      Patient seen health and wellness with Dr. Owens Shark for weight loss,  pt goal to lose 50lbs      Depression Screen PHQ 2/9 Scores 07/14/2020 07/14/2020 08/28/2018 04/06/2018  PHQ - 2 Score 0 0 5 0  PHQ- 9 Score - - 19 -  Exception Documentation - - Medical reason -    Fall Risk Fall Risk  07/14/2020 04/06/2018  Falls in the past year? 0 0  Number falls in past yr: 0 -  Injury with Fall? 0 -  Follow up Falls evaluation completed -    FALL RISK PREVENTION PERTAINING TO THE HOME:  Any stairs in or around the home? Yes  If so, are there any without handrails? Yes  Home free of loose throw rugs in walkways, pet beds,  electrical cords, etc? Yes  Adequate lighting in your home to reduce risk of falls? Yes   ASSISTIVE DEVICES UTILIZED TO PREVENT FALLS:  Life alert? No  Use of a cane, walker or w/c? No  Grab bars in the bathroom? Yes  Shower chair or bench in shower? No  Elevated toilet seat or a handicapped toilet? No   TIMED UP AND GO:  Was the test performed? Yes .  Length of time to ambulate 10 feet: 6 sec.   Gait steady and fast with assistive device  Cognitive Function:     Normal cognitive status assessed by direct observation by this Nurse Health Advisor. No abnormalities found.      Immunizations Immunization History  Administered Date(s) Administered  . Influenza Split 01/11/2012  . Influenza,inj,Quad PF,6+ Mos 01/22/2014  . Janssen (J&J) SARS-COV-2 Vaccination 09/05/2019  . Td 04/12/1997, 11/15/2008  . Zoster Recombinat (Shingrix) 05/09/2019, 07/12/2019    TDAP status: Due, Education has been provided regarding the importance of this vaccine. Advised may receive this vaccine at local pharmacy or Health Dept. Aware to provide a copy of the vaccination record if obtained from local pharmacy or Health Dept. Verbalized acceptance and understanding.  Flu Vaccine status: Up to date  Pneumococcal vaccine status: Due, Education has been provided regarding the importance of this vaccine. Advised may receive this vaccine at local pharmacy or Health Dept. Aware to provide a copy of the vaccination record if obtained from local pharmacy or Health Dept. Verbalized acceptance and understanding.  Covid-19 vaccine status: Completed vaccines  Qualifies for Shingles Vaccine? Yes   Zostavax completed No   Shingrix Completed?: Yes  Screening Tests Health Maintenance  Topic Date Due  . TETANUS/TDAP  11/16/2018  . COVID-19 Vaccine (2 - Booster for [employer] series) 10/31/2019  . COLONOSCOPY (Pts 45-82yrs Insurance  coverage will need to be confirmed)  10/28/2020  . INFLUENZA VACCINE  11/10/2020   . Hepatitis C Screening  Completed  . HIV Screening  Completed  . HPV VACCINES  Aged Out    Health Maintenance  Health Maintenance Due  Topic Date Due  . TETANUS/TDAP  11/16/2018  . COVID-19 Vaccine (2 - Booster for Janssen series) 10/31/2019    Colorectal cancer screening: Type of screening: Colonoscopy. Completed 10/28/2017. Repeat every 5 years  Lung Cancer Screening: (Low Dose CT Chest recommended if Age 35-80 years, 30 pack-year currently smoking OR have quit w/in 15years.) does not qualify.   Lung Cancer Screening Referral: n/a   Additional Screening:  Hepatitis C Screening: does qualify; Completed 04/06/2018  Vision Screening: Recommended annual ophthalmology exams for early detection of glaucoma and other disorders of the eye. Is the patient up to date with their annual eye exam?  Yes  Who is the provider or what is the name of the office in which the patient attends annual eye exams? [facility] If pt is not established with a provider, would they like to be referred to a provider to establish care? No .   Dental Screening: Recommended annual dental exams for proper oral hygiene  Community Resource Referral / Chronic Care Management: CRR required this visit?  No   CCM required this visit?  No      Plan:     I have personally reviewed and noted the following in the patient's chart:   . Medical and social history . Use of alcohol, tobacco or illicit drugs  . Current medications and supplements . Functional ability and status . Nutritional status . Physical activity . Advanced directives . List of other physicians . Hospitalizations, surgeries, and ER visits in previous 12 months . Vitals . Screenings to include cognitive, depression, and falls . Referrals and appointments  In addition, I have reviewed and discussed with patient certain preventive protocols, quality metrics, and best practice recommendations. A written personalized care plan  for preventive services as well as general preventive health recommendations were provided to patient.     Randel Pigg, LPN   09/14/7844   Nurse Notes: none

## 2020-07-14 ENCOUNTER — Ambulatory Visit (INDEPENDENT_AMBULATORY_CARE_PROVIDER_SITE_OTHER): Payer: Medicare Other

## 2020-07-14 ENCOUNTER — Other Ambulatory Visit: Payer: Self-pay

## 2020-07-14 VITALS — BP 132/80 | HR 52 | Temp 97.8°F | Wt 290.0 lb

## 2020-07-14 DIAGNOSIS — Z1211 Encounter for screening for malignant neoplasm of colon: Secondary | ICD-10-CM

## 2020-07-14 DIAGNOSIS — Z0000 Encounter for general adult medical examination without abnormal findings: Secondary | ICD-10-CM

## 2020-07-14 NOTE — Patient Instructions (Signed)
Jonathan [name] , Thank you for taking time to come for your Medicare Wellness Visit. I appreciate your ongoing commitment to your health goals. Please review the following plan we discussed and let me know if I can assist you in the future.    /Screening recommendations/referrals:Colonoscopy:  completed [facility]  10/2019 every 3 years  Recommended yearly ophthalmology/optometry visit for glaucoma screening and checkup Recommended yearly dental visit for hygiene and checkup  Vaccinations: Influenza vaccine: due fall 2022 Pneumococcal vaccine: completed series Tdap vaccine: Due  With injury  Shingles vaccine: completed series   Advanced directives: will provided copies   Conditions/risks identified: none   Next appointment: none    Preventive Care 64 Years and Older, Male Preventive care refers to lifestyle choices and visits with your health care provider that can promote health and wellness. What does preventive care include?  A yearly physical exam. This is also called an annual well check.  Dental exams once or twice a year.  Routine eye exams. Ask your health care provider how often you should have your eyes checked.  Personal lifestyle choices, including:  Daily care of your teeth and gums.  Regular physical activity.  Eating a healthy diet.  Avoiding tobacco and drug use.  Limiting alcohol use.  Practicing safe sex.  Taking low-dose aspirin every day.  Taking vitamin and mineral supplements as recommended by your health care provider. What happens during an annual well check? The services and screenings done by your health care provider during your annual well check will depend on your age, overall health, lifestyle risk factors, and family history of disease. Counseling  Your health care provider may ask you questions about your:  Alcohol use.  Tobacco use.  Drug use.  Emotional well-being.  Home and relationship well-being.  Sexual activity.  Eating  habits.  History of falls.  Memory and ability to understand (cognition).  Work and work Statistician.  Reproductive health. Screening  You may have the following tests or measurements:  Height, weight, and BMI.  Blood pressure.  Lipid and cholesterol levels. These may be checked every 5 years, or more frequently if you are over 64 years old.  Skin check.  Lung cancer screening. You may have this screening every year starting at age 78 if you have a 30-pack-year history of smoking and currently smoke or have quit within the past 15 years.  Fecal occult blood test (FOBT) of the stool. You may have this test every year starting at age 36.  Flexible sigmoidoscopy or colonoscopy. You may have a sigmoidoscopy every 5 years or a colonoscopy every 10 years starting at age 74.  Hepatitis C blood test.  Hepatitis B blood test.  Sexually transmitted disease (STD) testing.  Diabetes screening. This is done by checking your blood sugar (glucose) after you have not eaten for a while (fasting). You may have this done every 1-3 years.  Bone density scan. This is done to screen for osteoporosis. You may have this done starting at age 62.  Mammogram. This may be done every 1-2 years. Talk to your health care provider about how often you should have regular mammograms. Talk with your health care provider about your test results, treatment options, and if necessary, the need for more tests. Vaccines  Your health care provider may recommend certain vaccines, such as:  Influenza vaccine. This is recommended every year.  Tetanus, diphtheria, and acellular pertussis (Tdap, Td) vaccine. You may need a Td booster every 10 years.  Zoster  vaccine. You may need this after age 58.  Pneumococcal 13-valent conjugate (PCV13) vaccine. One dose is recommended after age 11.  Pneumococcal polysaccharide (PPSV23) vaccine. One dose is recommended after age 58. Talk to your health care provider about which  screenings and vaccines you need and how often you need them. This information is not intended to replace advice given to you by your health care provider. Make sure you discuss any questions you have with your health care provider. Document Released: 04/25/2015 Document Revised: 12/17/2015 Document Reviewed: 01/28/2015 Elsevier Interactive Patient Education  2017 [place] Prevention in the Home Falls can cause injuries. They can happen to people of all ages. There are many things you can do to make your home safe and to help prevent falls. What can I do on the outside of my home?  Regularly fix the edges of walkways and driveways and fix any cracks.  Remove anything that might make you trip as you walk through a door, such as a raised step or threshold.  Trim any bushes or trees on the path to your home.  Use bright outdoor lighting.  Clear any walking paths of anything that might make someone trip, such as rocks or tools.  Regularly check to see if handrails are loose or broken. Make sure that both sides of any steps have handrails.  Any raised decks and porches should have guardrails on the edges.  Have any leaves, snow, or ice cleared regularly.  Use sand or salt on walking paths during winter.  Clean up any spills in your garage right away. This includes oil or grease spills. What can I do in the bathroom?  Use night lights.  Install grab bars by the toilet and in the tub and shower. Do not use towel bars as grab bars.  Use non-skid mats or decals in the tub or shower.  If you need to sit down in the shower, use a plastic, non-slip stool.  Keep the floor dry. Clean up any water that spills on the floor as soon as it happens.  Remove soap buildup in the tub or shower regularly.  Attach bath mats securely with double-sided non-slip rug tape.  Do not have throw rugs and other things on the floor that can make you trip. What can I do in the bedroom?  Use  night lights.  Make sure that you have a light by your bed that is easy to reach.  Do not use any sheets or blankets that are too big for your bed. They should not hang down onto the floor.  Have a firm chair that has side arms. You can use this for support while you get dressed.  Do not have throw rugs and other things on the floor that can make you trip. What can I do in the kitchen?  Clean up any spills right away.  Avoid walking on wet floors.  Keep items that you use a lot in easy-to-reach places.  If you need to reach something above you, use a strong step stool that has a grab bar.  Keep electrical cords out of the way.  Do not use floor polish or wax that makes floors slippery. If you must use wax, use non-skid floor wax.  Do not have throw rugs and other things on the floor that can make you trip. What can I do with my stairs?  Do not leave any items on the stairs.  Make sure that there are handrails  on both sides of the stairs and use them. Fix handrails that are broken or loose. Make sure that handrails are as long as the stairways.  Check any carpeting to make sure that it is firmly attached to the stairs. Fix any carpet that is loose or worn.  Avoid having throw rugs at the top or bottom of the stairs. If you do have throw rugs, attach them to the floor with carpet tape.  Make sure that you have a light switch at the top of the stairs and the bottom of the stairs. If you do not have them, ask someone to add them for you. What else can I do to help prevent falls?  Wear shoes that:  Do not have high heels.  Have rubber bottoms.  Are comfortable and fit you well.  Are closed at the toe. Do not wear sandals.  If you use a stepladder:  Make sure that it is fully opened. Do not climb a closed stepladder.  Make sure that both sides of the stepladder are locked into place.  Ask someone to hold it for you, if possible.  Clearly mark and make sure that you  can see:  Any grab bars or handrails.  First and last steps.  Where the edge of each step is.  Use tools that help you move around (mobility aids) if they are needed. These include:  Canes.  Walkers.  Scooters.  Crutches.  Turn on the lights when you go into a dark area. Replace any light bulbs as soon as they burn out.  Set up your furniture so you have a clear path. Avoid moving your furniture around.  If any of your floors are uneven, fix them.  If there are any pets around you, be aware of where they are.  Review your medicines with your doctor. Some medicines can make you feel dizzy. This can increase your chance of falling. Ask your doctor what other things that you can do to help prevent falls. This information is not intended to replace advice given to you by your health care provider. Make sure you discuss any questions you have with your health care provider. Document Released: 01/23/2009 Document Revised: 09/04/2015 Document Reviewed: 05/03/2014 Elsevier Interactive Patient Education  2017 [employer].

## 2020-07-27 ENCOUNTER — Other Ambulatory Visit (INDEPENDENT_AMBULATORY_CARE_PROVIDER_SITE_OTHER): Payer: Self-pay | Admitting: Bariatrics

## 2020-07-27 DIAGNOSIS — E559 Vitamin D deficiency, unspecified: Secondary | ICD-10-CM

## 2020-07-28 ENCOUNTER — Encounter (INDEPENDENT_AMBULATORY_CARE_PROVIDER_SITE_OTHER): Payer: Self-pay | Admitting: Bariatrics

## 2020-07-28 ENCOUNTER — Ambulatory Visit (INDEPENDENT_AMBULATORY_CARE_PROVIDER_SITE_OTHER): Payer: Medicare Other | Admitting: Bariatrics

## 2020-07-28 ENCOUNTER — Other Ambulatory Visit: Payer: Self-pay

## 2020-07-28 VITALS — BP 135/80 | HR 52 | Temp 97.5°F | Ht 71.0 in | Wt 282.0 lb

## 2020-07-28 DIAGNOSIS — Z6841 Body Mass Index (BMI) 40.0 and over, adult: Secondary | ICD-10-CM

## 2020-07-28 DIAGNOSIS — E876 Hypokalemia: Secondary | ICD-10-CM | POA: Diagnosis not present

## 2020-07-28 DIAGNOSIS — E559 Vitamin D deficiency, unspecified: Secondary | ICD-10-CM

## 2020-07-28 MED ORDER — VITAMIN D (ERGOCALCIFEROL) 1.25 MG (50000 UNIT) PO CAPS: ORAL_CAPSULE | ORAL | 0 refills | Status: DC

## 2020-07-28 MED ORDER — POTASSIUM CHLORIDE ER 10 MEQ PO TBCR: 10.0000 meq | EXTENDED_RELEASE_TABLET | Freq: Every day | ORAL | 0 refills | Status: DC

## 2020-07-29 NOTE — Progress Notes (Signed)
Chief Complaint:   OBESITY Jonathan [name] is here to discuss his progress with his obesity treatment plan along with follow-up of his obesity related diagnoses. Jonathan [name] is on the [employer] and states he is following his eating plan approximately 75% of the time. Jonathan [name] states he is doing cardio for 30 minutes 3 times per week.  Today's visit was #: 39 Starting weight: 319 lbs Starting date: 08/28/2018 Today's weight: 282 lbs Today's date: 07/28/2020 Total lbs lost to date: 37 lbs Total lbs lost since last in-office visit: 0  Interim History: Jonathan [name] is up 1 pound since his last visit.  He has a digital scale and weighs daily.  He is up about 3 pounds on the bioimpedance machine.  Subjective:   1. Hypokalemia No signs or symptoms of low potassium.  Lab Results  Component Value Date   K 3.7 05/20/2020   2. [name]'s Vitamin D level was 42.5 on 03/10/2020. He is currently taking prescription vitamin D 50,000 IU each week. He denies nausea, vomiting or muscle weakness.  Assessment/Plan:   1. Hypokalemia Refill potassium, as per below.  - Refill potassium chloride (KLOR-CON) 10 MEQ tablet; Take 1 tablet (10 mEq total) by mouth daily.  Dispense: 30 tablet; Refill: 0  2. Vitamin D deficiency Low Vitamin D level contributes to fatigue and are associated with obesity, breast, and colon cancer. He agrees to continue to take prescription Vitamin D @50 ,000 IU every week and will follow-up for routine testing of Vitamin D, at least 2-3 times per year to avoid over-replacement.  - Refill Vitamin D, Ergocalciferol, (DRISDOL) 1.25 MG (50000 UNIT) CAPS capsule; TAKE ONE CAPSULE BY MOUTH EVERY TWO WEEKS  Dispense: 2 capsule; Refill: 0  3. Obesity, current BMI 39  Jonathan [name] is currently in the action stage of change. As such, his goal is to continue with weight loss efforts. He has agreed to the [employer].   He will work on meal planning and will see the orthopedist  for his left knee pain.  Exercise goals: Left knee pain and less exercise.  Behavioral modification strategies: increasing lean protein intake, decreasing simple carbohydrates, increasing vegetables, increasing water intake, decreasing eating out, no skipping meals, meal planning and cooking strategies, keeping healthy foods in the home and planning for success.  Jonathan [name] has agreed to follow-up with our clinic in 3-4 weeks. He was informed of the importance of frequent follow-up visits to maximize his success with intensive lifestyle modifications for his multiple health conditions.   Objective:   Blood pressure 135/80, pulse (!) 52, temperature (!) 97.5 F (36.4 C), height 5\' 11"  (1.803 m), weight 282 lb (127.9 kg), SpO2 95 %. Body mass index is 39.33 kg/m.  General: Cooperative, alert, well developed, in no acute distress. HEENT: Conjunctivae and lids unremarkable. Cardiovascular: Regular rhythm.  Lungs: Normal work of breathing. Neurologic: No focal deficits.   Lab Results  Component Value Date   CREATININE 0.88 05/20/2020   BUN 14 05/20/2020   NA 135 05/20/2020   K 3.7 05/20/2020   CL 92 (L) 05/20/2020   CO2 37 (H) 05/20/2020   Lab Results  Component Value Date   ALT 12 05/20/2020   AST 16 05/20/2020   ALKPHOS 60 05/20/2020   BILITOT 0.9 05/20/2020   Lab Results  Component Value Date   HGBA1C 6.0 05/20/2020   HGBA1C 5.9 (H) 03/10/2020   HGBA1C 5.7 (H) 09/27/2019   HGBA1C 5.9 05/09/2019   HGBA1C 6.1 (H) 01/04/2019  Lab Results  Component Value Date   INSULIN 19.1 03/10/2020   INSULIN 11.2 09/27/2019   INSULIN 46.3 (H) 08/28/2018   Lab Results  Component Value Date   TSH 1.59 05/20/2020   Lab Results  Component Value Date   CHOL 164 03/10/2020   HDL 48 03/10/2020   LDLCALC 101 (H) 03/10/2020   TRIG 81 03/10/2020   CHOLHDL 4 05/09/2019   Lab Results  Component Value Date   WBC 7.3 05/20/2020   HGB 13.8 05/20/2020   HCT 41.6 05/20/2020   MCV 80.0  05/20/2020   PLT 198.0 05/20/2020   Lab Results  Component Value Date   IRON 150 12/15/2011   FERRITIN 9.2 (L) 12/15/2011   Obesity Behavioral Intervention:   Approximately 15 minutes were spent on the discussion below.  ASK: We discussed the diagnosis of obesity with Jonathan [name] today and Jonathan [name] agreed to give [name] permission to discuss obesity behavioral modification therapy today.  ASSESS: Jonathan [name] has the diagnosis of obesity and his BMI today is 39.4. Karmelo is in the action stage of change.   ADVISE: Jonathan [name] was educated on the multiple health risks of obesity as well as the benefit of weight loss to improve his health. He was advised of the need for long term treatment and the importance of lifestyle modifications to improve his current health and to decrease his risk of future health problems.  AGREE: Multiple dietary modification options and treatment options were discussed and Jonathan [name] agreed to follow the recommendations documented in the above note.  ARRANGE: Jonathan [name] was educated on the importance of frequent visits to treat obesity as outlined per CMS and USPSTF guidelines and agreed to schedule his next follow up appointment today.  Attestation Statements:   Reviewed by clinician on day of visit: allergies, medications, problem list, medical history, surgical history, family history, social history, and previous encounter notes.  I, Water quality scientist, CMA, am acting as Location manager for [employer], DO  I have reviewed the above documentation for accuracy and completeness, and I agree with the above. [name], DO

## 2020-08-11 DIAGNOSIS — G4733 Obstructive sleep apnea (adult) (pediatric): Secondary | ICD-10-CM | POA: Diagnosis not present

## 2020-08-18 ENCOUNTER — Other Ambulatory Visit: Payer: Self-pay | Admitting: Adult Health

## 2020-08-18 DIAGNOSIS — I10 Essential (primary) hypertension: Secondary | ICD-10-CM

## 2020-08-21 ENCOUNTER — Other Ambulatory Visit (INDEPENDENT_AMBULATORY_CARE_PROVIDER_SITE_OTHER): Payer: Self-pay | Admitting: Bariatrics

## 2020-08-21 DIAGNOSIS — E559 Vitamin D deficiency, unspecified: Secondary | ICD-10-CM

## 2020-08-21 NOTE — Telephone Encounter (Signed)
Pt last seen by Dr. Brown.  

## 2020-08-23 ENCOUNTER — Other Ambulatory Visit (INDEPENDENT_AMBULATORY_CARE_PROVIDER_SITE_OTHER): Payer: Self-pay | Admitting: Bariatrics

## 2020-08-23 DIAGNOSIS — E876 Hypokalemia: Secondary | ICD-10-CM

## 2020-08-25 ENCOUNTER — Ambulatory Visit (INDEPENDENT_AMBULATORY_CARE_PROVIDER_SITE_OTHER): Payer: Medicare Other | Admitting: Bariatrics

## 2020-08-25 NOTE — Telephone Encounter (Signed)
Refill request

## 2020-08-28 ENCOUNTER — Ambulatory Visit (INDEPENDENT_AMBULATORY_CARE_PROVIDER_SITE_OTHER): Payer: Medicare Other | Admitting: Bariatrics

## 2020-09-01 ENCOUNTER — Other Ambulatory Visit: Payer: Self-pay

## 2020-09-01 ENCOUNTER — Ambulatory Visit (INDEPENDENT_AMBULATORY_CARE_PROVIDER_SITE_OTHER): Payer: Medicare Other | Admitting: Bariatrics

## 2020-09-01 ENCOUNTER — Encounter (INDEPENDENT_AMBULATORY_CARE_PROVIDER_SITE_OTHER): Payer: Self-pay | Admitting: Bariatrics

## 2020-09-01 VITALS — BP 131/75 | HR 47 | Temp 97.8°F | Ht 71.0 in | Wt 278.0 lb

## 2020-09-01 DIAGNOSIS — Z6841 Body Mass Index (BMI) 40.0 and over, adult: Secondary | ICD-10-CM | POA: Diagnosis not present

## 2020-09-01 DIAGNOSIS — E559 Vitamin D deficiency, unspecified: Secondary | ICD-10-CM

## 2020-09-01 DIAGNOSIS — I10 Essential (primary) hypertension: Secondary | ICD-10-CM

## 2020-09-01 MED ORDER — VITAMIN D (ERGOCALCIFEROL) 1.25 MG (50000 UNIT) PO CAPS: ORAL_CAPSULE | ORAL | 0 refills | Status: DC

## 2020-09-02 NOTE — Progress Notes (Signed)
Chief Complaint:   OBESITY Ichiro is here to discuss his progress with his obesity treatment plan along with follow-up of his obesity related diagnoses. Paiton is on the [employer] and states he is following his eating plan approximately 75% of the time. Keithan states he is doing cardio and strength training 45-50 minutes 3 times per week.  Today's visit was #: 33 Starting weight: 319 lbs Starting date: 08/28/2018 Today's weight: 278 lbs Today's date: 09/01/2020 Total lbs lost to date: 41 lbs Total lbs lost since last in-office visit: 4  Interim History: Tricia is down an additional 4 lbs.  Subjective:   1. Vitamin D deficiency Quintin reports minimal sun exposure.  2. Essential hypertension Min is taking Norvasc, atenolol, and [place].  Assessment/Plan:   1. Vitamin D deficiency Low Vitamin D level contributes to fatigue and are associated with obesity, breast, and colon cancer. He agrees to continue to take prescription Vitamin D @50 ,000 IU every 2 weeks and will follow-up for routine testing of Vitamin D, at least 2-3 times per year to avoid over-replacement. - Vitamin D, Ergocalciferol, (DRISDOL) 1.25 MG (50000 UNIT) CAPS capsule; TAKE ONE CAPSULE BY MOUTH EVERY TWO WEEKS  Dispense: 2 capsule; Refill: 0  2. Essential hypertension Kimm is working on healthy weight loss and exercise to improve blood pressure control. We will watch for signs of hypotension as he continues his lifestyle modifications. Continue current treatment plan.  3. Obesity, current BMI 39 Stuart is currently in the action stage of change. As such, his goal is to continue with weight loss efforts. He has agreed to the [employer].   Meal plan Intentional eating  Exercise goals: As is  Behavioral modification strategies: increasing lean protein intake, decreasing simple carbohydrates, increasing vegetables, increasing water intake, decreasing eating out, no skipping meals, meal  planning and cooking strategies, keeping healthy foods in the home and planning for success.  Jwan has agreed to follow-up with our clinic in 3-4 weeks. He was informed of the importance of frequent follow-up visits to maximize his success with intensive lifestyle modifications for his multiple health conditions.   Objective:   Blood pressure 131/75, pulse (!) 47, temperature 97.8 F (36.6 C), height 5\' 11"  (1.803 m), weight 278 lb (126.1 kg), SpO2 95 %. Body mass index is 38.77 kg/m.  General: Cooperative, alert, well developed, in no acute distress. HEENT: Conjunctivae and lids unremarkable. Cardiovascular: Regular rhythm.  Lungs: Normal work of breathing. Neurologic: No focal deficits.   Lab Results  Component Value Date   CREATININE 0.88 05/20/2020   BUN 14 05/20/2020   NA 135 05/20/2020   K 3.7 05/20/2020   CL 92 (L) 05/20/2020   CO2 37 (H) 05/20/2020   Lab Results  Component Value Date   ALT 12 05/20/2020   AST 16 05/20/2020   ALKPHOS 60 05/20/2020   BILITOT 0.9 05/20/2020   Lab Results  Component Value Date   HGBA1C 6.0 05/20/2020   HGBA1C 5.9 (H) 03/10/2020   HGBA1C 5.7 (H) 09/27/2019   HGBA1C 5.9 05/09/2019   HGBA1C 6.1 (H) 01/04/2019   Lab Results  Component Value Date   INSULIN 19.1 03/10/2020   INSULIN 11.2 09/27/2019   INSULIN 46.3 (H) 08/28/2018   Lab Results  Component Value Date   TSH 1.59 05/20/2020   Lab Results  Component Value Date   CHOL 164 03/10/2020   HDL 48 03/10/2020   LDLCALC 101 (H) 03/10/2020   TRIG 81 03/10/2020   CHOLHDL  4 05/09/2019   Lab Results  Component Value Date   WBC 7.3 05/20/2020   HGB 13.8 05/20/2020   HCT 41.6 05/20/2020   MCV 80.0 05/20/2020   PLT 198.0 05/20/2020   Lab Results  Component Value Date   IRON 150 12/15/2011   FERRITIN 9.2 (L) 12/15/2011    Obesity Behavioral Intervention:   Approximately 15 minutes were spent on the discussion below.  ASK: We discussed the diagnosis of obesity  with [name] today and Luisfernando agreed to give [name] permission to discuss obesity behavioral modification therapy today.  ASSESS: Hezekiah has the diagnosis of obesity and his BMI today is 38.8. Rashaud is in the action stage of change.   ADVISE: Muhammadali was educated on the multiple health risks of obesity as well as the benefit of weight loss to improve his health. He was advised of the need for long term treatment and the importance of lifestyle modifications to improve his current health and to decrease his risk of future health problems.  AGREE: Multiple dietary modification options and treatment options were discussed and Tayden agreed to follow the recommendations documented in the above note.  ARRANGE: Jakobie was educated on the importance of frequent visits to treat obesity as outlined per CMS and USPSTF guidelines and agreed to schedule his next follow up appointment today.  Attestation Statements:   Reviewed by clinician on day of visit: allergies, medications, problem list, medical history, surgical history, family history, social history, and previous encounter notes.  Coral Ceo, CMA, am acting as Location manager for [employer], DO.  I have reviewed the above documentation for accuracy and completeness, and I agree with the above. [name], DO

## 2020-09-03 ENCOUNTER — Encounter (INDEPENDENT_AMBULATORY_CARE_PROVIDER_SITE_OTHER): Payer: Self-pay | Admitting: Bariatrics

## 2020-09-21 ENCOUNTER — Other Ambulatory Visit (INDEPENDENT_AMBULATORY_CARE_PROVIDER_SITE_OTHER): Payer: Self-pay | Admitting: Bariatrics

## 2020-09-21 DIAGNOSIS — E876 Hypokalemia: Secondary | ICD-10-CM

## 2020-09-21 DIAGNOSIS — E559 Vitamin D deficiency, unspecified: Secondary | ICD-10-CM

## 2020-09-22 NOTE — Telephone Encounter (Signed)
Dr.Brown 

## 2020-09-23 ENCOUNTER — Other Ambulatory Visit: Payer: Self-pay

## 2020-09-23 ENCOUNTER — Ambulatory Visit (INDEPENDENT_AMBULATORY_CARE_PROVIDER_SITE_OTHER): Payer: Medicare Other | Admitting: Bariatrics

## 2020-09-23 ENCOUNTER — Encounter (INDEPENDENT_AMBULATORY_CARE_PROVIDER_SITE_OTHER): Payer: Self-pay | Admitting: Bariatrics

## 2020-09-23 VITALS — BP 129/79 | HR 48 | Temp 98.0°F | Ht 71.0 in | Wt 279.0 lb

## 2020-09-23 DIAGNOSIS — E876 Hypokalemia: Secondary | ICD-10-CM | POA: Diagnosis not present

## 2020-09-23 DIAGNOSIS — E669 Obesity, unspecified: Secondary | ICD-10-CM | POA: Diagnosis not present

## 2020-09-23 DIAGNOSIS — Z6841 Body Mass Index (BMI) 40.0 and over, adult: Secondary | ICD-10-CM | POA: Diagnosis not present

## 2020-09-23 DIAGNOSIS — E559 Vitamin D deficiency, unspecified: Secondary | ICD-10-CM

## 2020-09-23 MED ORDER — POTASSIUM CHLORIDE ER 10 MEQ PO TBCR: 10.0000 meq | EXTENDED_RELEASE_TABLET | Freq: Every day | ORAL | 0 refills | Status: DC

## 2020-09-23 MED ORDER — VITAMIN D (ERGOCALCIFEROL) 1.25 MG (50000 UNIT) PO CAPS: ORAL_CAPSULE | ORAL | 0 refills | Status: DC

## 2020-09-30 NOTE — Progress Notes (Signed)
Chief Complaint:   OBESITY Darshawn is here to discuss his progress with his obesity treatment plan along with follow-up of his obesity related diagnoses. Lakota is on following a lower carbohydrate, vegetable and lean protein rich diet plan and states he is following his eating plan approximately 75% of the time. Thoms states he is doing cardio for 90 minutes 3 times per week, and strengthening for 30 minutes 3 times per week.  Today's visit was #: 7 Starting weight: 319 lbs Starting date: 08/28/2018 Today's weight: 279 lbs Today's date: 09/23/2020 Total lbs lost to date: 40 Total lbs lost since last in-office visit: 0  Interim History: Kamaury is down 1 additional lb. He states that his body is changing. He is doing well with his water intake.  Subjective:   1. Vitamin D deficiency Murrel is taking Vit D as directed.  2. Hypokalemia Ceasar is taking Klor as directed.  Assessment/Plan:   1. Vitamin D deficiency Low Vitamin D level contributes to fatigue and are associated with obesity, breast, and colon cancer. We will refill prescription Vitamin D for 1 month. Sudeep will follow-up for routine testing of Vitamin D, at least 2-3 times per year to avoid over-replacement.  - Vitamin D, Ergocalciferol, (DRISDOL) 1.25 MG (50000 UNIT) CAPS capsule; TAKE ONE CAPSULE BY MOUTH EVERY TWO WEEKS  Dispense: 2 capsule; Refill: 0  2. [name] will refill Klor-con for 1 month, and Rayvon will continue to follow up as directed.  - potassium chloride (KLOR-CON) 10 MEQ tablet; Take 1 tablet (10 mEq total) by mouth daily.  Dispense: 30 tablet; Refill: 0  3. Obesity, current BMI 27 Tevyn is currently in the action stage of change. As such, his goal is to continue with weight loss efforts. He has agreed to following a lower carbohydrate, vegetable and lean protein rich diet plan.   Intentional eating was discussed.  Exercise goals: As is, and walk 10,000 steps.  Behavioral  modification strategies: increasing lean protein intake, decreasing simple carbohydrates, increasing vegetables, increasing water intake, decreasing eating out, no skipping meals, meal planning and cooking strategies, keeping healthy foods in the home, and planning for success.  Gina has agreed to follow-up with our clinic in 3 to 4 weeks. He was informed of the importance of frequent follow-up visits to maximize his success with intensive lifestyle modifications for his multiple health conditions.   Objective:   Blood pressure 129/79, pulse (!) 48, temperature 98 F (36.7 C), height 5\' 11"  (1.803 m), weight 279 lb (126.6 kg), SpO2 97 %. Body mass index is 38.91 kg/m.  General: Cooperative, alert, well developed, in no acute distress. HEENT: Conjunctivae and lids unremarkable. Cardiovascular: Regular rhythm.  Lungs: Normal work of breathing. Neurologic: No focal deficits.   Lab Results  Component Value Date   CREATININE 0.88 05/20/2020   BUN 14 05/20/2020   NA 135 05/20/2020   K 3.7 05/20/2020   CL 92 (L) 05/20/2020   CO2 37 (H) 05/20/2020   Lab Results  Component Value Date   ALT 12 05/20/2020   AST 16 05/20/2020   ALKPHOS 60 05/20/2020   BILITOT 0.9 05/20/2020   Lab Results  Component Value Date   HGBA1C 6.0 05/20/2020   HGBA1C 5.9 (H) 03/10/2020   HGBA1C 5.7 (H) 09/27/2019   HGBA1C 5.9 05/09/2019   HGBA1C 6.1 (H) 01/04/2019   Lab Results  Component Value Date   INSULIN 19.1 03/10/2020   INSULIN 11.2 09/27/2019   INSULIN 46.3 (H) 08/28/2018  Lab Results  Component Value Date   TSH 1.59 05/20/2020   Lab Results  Component Value Date   CHOL 164 03/10/2020   HDL 48 03/10/2020   LDLCALC 101 (H) 03/10/2020   TRIG 81 03/10/2020   CHOLHDL 4 05/09/2019   Lab Results  Component Value Date   WBC 7.3 05/20/2020   HGB 13.8 05/20/2020   HCT 41.6 05/20/2020   MCV 80.0 05/20/2020   PLT 198.0 05/20/2020   Lab Results  Component Value Date   IRON 150  12/15/2011   FERRITIN 9.2 (L) 12/15/2011    Obesity Behavioral Intervention:   Approximately 15 minutes were spent on the discussion below.  ASK: We discussed the diagnosis of obesity with [name] today and Teigen agreed to give [name] permission to discuss obesity behavioral modification therapy today.  ASSESS: Devonne has the diagnosis of obesity and his BMI today is 38.93. Chayne is in the action stage of change.   ADVISE: Arian was educated on the multiple health risks of obesity as well as the benefit of weight loss to improve his health. He was advised of the need for long term treatment and the importance of lifestyle modifications to improve his current health and to decrease his risk of future health problems.  AGREE: Multiple dietary modification options and treatment options were discussed and Champ agreed to follow the recommendations documented in the above note.  ARRANGE: Brodric was educated on the importance of frequent visits to treat obesity as outlined per CMS and USPSTF guidelines and agreed to schedule his next follow up appointment today.  Attestation Statements:   Reviewed by clinician on day of visit: allergies, medications, problem list, medical history, surgical history, family history, social history, and previous encounter notes.   Wilhemena Durie, am acting as Location manager for [employer], DO.  I have reviewed the above documentation for accuracy and completeness, and I agree with the above. [name], DO

## 2020-10-02 ENCOUNTER — Encounter (INDEPENDENT_AMBULATORY_CARE_PROVIDER_SITE_OTHER): Payer: Self-pay | Admitting: Bariatrics

## 2020-10-06 ENCOUNTER — Ambulatory Visit: Payer: Medicare Other | Admitting: Cardiology

## 2020-10-17 ENCOUNTER — Other Ambulatory Visit (INDEPENDENT_AMBULATORY_CARE_PROVIDER_SITE_OTHER): Payer: Self-pay | Admitting: Bariatrics

## 2020-10-17 DIAGNOSIS — E876 Hypokalemia: Secondary | ICD-10-CM

## 2020-10-17 DIAGNOSIS — E559 Vitamin D deficiency, unspecified: Secondary | ICD-10-CM

## 2020-10-20 ENCOUNTER — Ambulatory Visit (INDEPENDENT_AMBULATORY_CARE_PROVIDER_SITE_OTHER): Payer: Medicare Other | Admitting: Bariatrics

## 2020-10-20 NOTE — Telephone Encounter (Signed)
Pt last seen by Dr. Brown.  

## 2020-11-03 ENCOUNTER — Encounter (INDEPENDENT_AMBULATORY_CARE_PROVIDER_SITE_OTHER): Payer: Self-pay | Admitting: Bariatrics

## 2020-11-03 ENCOUNTER — Other Ambulatory Visit: Payer: Self-pay

## 2020-11-03 ENCOUNTER — Ambulatory Visit (INDEPENDENT_AMBULATORY_CARE_PROVIDER_SITE_OTHER): Payer: Medicare Other | Admitting: Bariatrics

## 2020-11-03 VITALS — BP 130/77 | HR 49 | Temp 98.3°F | Ht 71.0 in | Wt 278.0 lb

## 2020-11-03 DIAGNOSIS — E559 Vitamin D deficiency, unspecified: Secondary | ICD-10-CM

## 2020-11-03 DIAGNOSIS — E876 Hypokalemia: Secondary | ICD-10-CM

## 2020-11-03 DIAGNOSIS — Z6841 Body Mass Index (BMI) 40.0 and over, adult: Secondary | ICD-10-CM

## 2020-11-03 MED ORDER — VITAMIN D (ERGOCALCIFEROL) 1.25 MG (50000 UNIT) PO CAPS: ORAL_CAPSULE | ORAL | 0 refills | Status: DC

## 2020-11-03 MED ORDER — POTASSIUM CHLORIDE ER 10 MEQ PO TBCR: 10.0000 meq | EXTENDED_RELEASE_TABLET | Freq: Every day | ORAL | 0 refills | Status: DC

## 2020-11-07 ENCOUNTER — Encounter (INDEPENDENT_AMBULATORY_CARE_PROVIDER_SITE_OTHER): Payer: Self-pay | Admitting: Bariatrics

## 2020-11-07 NOTE — Progress Notes (Signed)
Chief Complaint:   OBESITY Jonathan [name] is here to discuss his progress with his obesity treatment plan along with follow-up of his obesity related diagnoses. Jonathan [name] is on the [employer] and states he is following his eating plan approximately 75% of the time. Jonathan [name] states he is doing cardio and strength 30 minutes 3-4 times per week.  Today's visit was #: 9 Starting weight: 319 lbs Starting date: 08/28/2018 Today's weight: 278 lbs Today's date: 11/03/2020 Total lbs lost to date: 41 lbs Total lbs lost since last in-office visit: 1lb  Interim History: Jonathan [name] is down an additional 1 ib.  Subjective:   1. [name] is taking Chlorthalidone in [place].  2. Vitamin D deficiency He is currently taking prescription vitamin D 50,000 IU each week. He denies nausea, vomiting or muscle weakness.  Lab Results  Component Value Date   VD25OH 42.5 03/10/2020   VD25OH 54.3 09/27/2019   VD25OH 35.21 05/09/2019     Lab Results  Component Value Date   VD25OH 42.5 03/10/2020   VD25OH 54.3 09/27/2019   VD25OH 35.21 05/09/2019    Assessment/Plan:   1. [name] will continue taking medications and will follow up as directed. - potassium chloride (KLOR-CON) 10 MEQ tablet; Take 1 tablet (10 mEq total) by mouth daily.  Dispense: 30 tablet; Refill: 0  2. Vitamin D deficiency Low Vitamin D level contributes to fatigue and are associated with obesity, breast, and colon cancer. He agrees to continue to take prescription Vitamin D '@50'$ ,000 IU every week and will follow-up for routine testing of Vitamin D, at least 2-3 times per year to avoid over-replacement.  - Vitamin D, Ergocalciferol, (DRISDOL) 1.25 MG (50000 UNIT) CAPS capsule; TAKE ONE CAPSULE BY MOUTH EVERY TWO WEEKS  Dispense: 2 capsule; Refill: 0  3. Obesity, current BMI 38.8 Jonathan [name] is currently in the action stage of change. As such, his goal is to continue with weight loss efforts. He has agreed to the  [employer].   Meal planning Intentional eating  Exercise goals: Not doing as much due to increase of pain in left knee.  Behavioral modification strategies: increasing lean protein intake, decreasing simple carbohydrates, increasing vegetables, increasing water intake, decreasing eating out, no skipping meals, meal planning and cooking strategies, keeping healthy foods in the home, and planning for success.  Jonathan [name] has agreed to follow-up with our clinic in 4 weeks. He was informed of the importance of frequent follow-up visits to maximize his success with intensive lifestyle modifications for his multiple health conditions.   Objective:   Blood pressure 130/77, pulse (!) 49, temperature 98.3 F (36.8 C), height '5\' 11"'$  (1.803 m), weight 278 lb (126.1 kg), SpO2 97 %. Body mass index is 38.77 kg/m.  General: Cooperative, alert, well developed, in no acute distress. HEENT: Conjunctivae and lids unremarkable. Cardiovascular: Regular rhythm.  Lungs: Normal work of breathing. Neurologic: No focal deficits.   Lab Results  Component Value Date   CREATININE 0.88 05/20/2020   BUN 14 05/20/2020   NA 135 05/20/2020   K 3.7 05/20/2020   CL 92 (L) 05/20/2020   CO2 37 (H) 05/20/2020   Lab Results  Component Value Date   ALT 12 05/20/2020   AST 16 05/20/2020   ALKPHOS 60 05/20/2020   BILITOT 0.9 05/20/2020   Lab Results  Component Value Date   HGBA1C 6.0 05/20/2020   HGBA1C 5.9 (H) 03/10/2020   HGBA1C 5.7 (H) 09/27/2019   HGBA1C 5.9 05/09/2019   HGBA1C 6.1 (H)  01/04/2019   Lab Results  Component Value Date   INSULIN 19.1 03/10/2020   INSULIN 11.2 09/27/2019   INSULIN 46.3 (H) 08/28/2018   Lab Results  Component Value Date   TSH 1.59 05/20/2020   Lab Results  Component Value Date   CHOL 164 03/10/2020   HDL 48 03/10/2020   LDLCALC 101 (H) 03/10/2020   TRIG 81 03/10/2020   CHOLHDL 4 05/09/2019   Lab Results  Component Value Date   VD25OH 42.5 03/10/2020    VD25OH 54.3 09/27/2019   VD25OH 35.21 05/09/2019   Lab Results  Component Value Date   WBC 7.3 05/20/2020   HGB 13.8 05/20/2020   HCT 41.6 05/20/2020   MCV 80.0 05/20/2020   PLT 198.0 05/20/2020   Lab Results  Component Value Date   IRON 150 12/15/2011   FERRITIN 9.2 (L) 12/15/2011    Obesity Behavioral Intervention:   Approximately 15 minutes were spent on the discussion below.  ASK: We discussed the diagnosis of obesity with [name] today and Coray agreed to give [name] permission to discuss obesity behavioral modification therapy today.  ASSESS: Wyeth has the diagnosis of obesity and his BMI today is 38.8. Raimon is in the action stage of change.   ADVISE: Jerrod was educated on the multiple health risks of obesity as well as the benefit of weight loss to improve his health. He was advised of the need for long term treatment and the importance of lifestyle modifications to improve his current health and to decrease his risk of future health problems.  AGREE: Multiple dietary modification options and treatment options were discussed and Jyrin agreed to follow the recommendations documented in the above note.  ARRANGE: Stefone was educated on the importance of frequent visits to treat obesity as outlined per CMS and USPSTF guidelines and agreed to schedule his next follow up appointment today.  Attestation Statements:   Reviewed by clinician on day of visit: allergies, medications, problem list, medical history, surgical history, family history, social history, and previous encounter notes.  ILennette Bihari, CMA, am acting as transcriptionist for Dr. [name], DO.   I have reviewed the above documentation for accuracy and completeness, and I agree with the above. [name], DO

## 2020-11-11 DIAGNOSIS — G4733 Obstructive sleep apnea (adult) (pediatric): Secondary | ICD-10-CM | POA: Diagnosis not present

## 2020-11-12 DIAGNOSIS — R300 Dysuria: Secondary | ICD-10-CM | POA: Diagnosis not present

## 2020-11-19 ENCOUNTER — Encounter: Payer: Self-pay | Admitting: Gastroenterology

## 2020-11-24 ENCOUNTER — Other Ambulatory Visit: Payer: Self-pay

## 2020-11-24 ENCOUNTER — Encounter (INDEPENDENT_AMBULATORY_CARE_PROVIDER_SITE_OTHER): Payer: Self-pay | Admitting: Bariatrics

## 2020-11-24 ENCOUNTER — Ambulatory Visit (INDEPENDENT_AMBULATORY_CARE_PROVIDER_SITE_OTHER): Payer: Medicare Other | Admitting: Bariatrics

## 2020-11-24 VITALS — BP 137/74 | HR 42 | Temp 98.3°F | Ht 71.0 in | Wt 278.0 lb

## 2020-11-24 DIAGNOSIS — Z6841 Body Mass Index (BMI) 40.0 and over, adult: Secondary | ICD-10-CM

## 2020-11-24 DIAGNOSIS — E559 Vitamin D deficiency, unspecified: Secondary | ICD-10-CM | POA: Diagnosis not present

## 2020-11-24 DIAGNOSIS — E876 Hypokalemia: Secondary | ICD-10-CM

## 2020-11-24 MED ORDER — POTASSIUM CHLORIDE ER 10 MEQ PO TBCR: 10.0000 meq | EXTENDED_RELEASE_TABLET | Freq: Every day | ORAL | 0 refills | Status: DC

## 2020-11-24 MED ORDER — VITAMIN D (ERGOCALCIFEROL) 1.25 MG (50000 UNIT) PO CAPS: ORAL_CAPSULE | ORAL | 0 refills | Status: DC

## 2020-11-25 ENCOUNTER — Other Ambulatory Visit (INDEPENDENT_AMBULATORY_CARE_PROVIDER_SITE_OTHER): Payer: Self-pay | Admitting: Bariatrics

## 2020-11-25 DIAGNOSIS — E559 Vitamin D deficiency, unspecified: Secondary | ICD-10-CM

## 2020-11-25 NOTE — Progress Notes (Signed)
Chief Complaint:   OBESITY Jonathan [name] is here to discuss his progress with his obesity treatment plan along with follow-up of his obesity related diagnoses. Jonathan [name] is on the [employer] and states he is following his eating plan approximately 80% of the time. Jonathan [name] states he is walking for 60 minutes 5 times per week.  Today's visit was #: 39 Starting weight: 319 lbs Starting date: 08/28/2018 Today's weight: 278 lbs Today's date: 11/24/2020 Total lbs lost to date: 41 lbs Total lbs lost since last in-office visit: 0  Interim History: Jonathan [name] weight remains the same as his last visit. He is getting adequate protein and water.  Subjective:   1. Vitamin D deficiency He is currently taking prescription vitamin D 50,000 IU each week. He denies nausea, vomiting or muscle weakness.  2. [name] is taking his medication as directed.  Assessment/Plan:   1. Vitamin D deficiency Low Vitamin D level contributes to fatigue and are associated with obesity, breast, and colon cancer. We will refill prescription Vitamin D 50,000 IU every week for 1 month with no refills and Jonathan [name] will follow-up for routine testing of Vitamin D, at least 2-3 times per year to avoid over-replacement.  - Vitamin D, Ergocalciferol, (DRISDOL) 1.25 MG (50000 UNIT) CAPS capsule; TAKE ONE CAPSULE BY MOUTH EVERY TWO WEEKS  Dispense: 2 capsule; Refill: 0  2. [name] will refill potassium chloride 10 MEQ total by mouth for 1 month with no refills.  - potassium chloride (KLOR-CON) 10 MEQ tablet; Take 1 tablet (10 mEq total) by mouth daily.  Dispense: 30 tablet; Refill: 0  3. Obesity, current BMI 38.9 Jonathan [name] is currently in the action stage of change. As such, his goal is to continue with weight loss efforts. He has agreed to the [employer].   Jonathan [name] will continue meal planning. She will continue intentional eating.  Exercise goals:  Jonathan [name] is still working out but not as much.  Behavioral  modification strategies: increasing lean protein intake, decreasing simple carbohydrates, increasing vegetables, increasing water intake, decreasing eating out, no skipping meals, meal planning and cooking strategies, keeping healthy foods in the home, and planning for success.  Jonathan [name] has agreed to follow-up with our clinic in 3-4 weeks. He was informed of the importance of frequent follow-up visits to maximize his success with intensive lifestyle modifications for his multiple health conditions.   Objective:   Blood pressure 137/74, pulse (!) 42, temperature 98.3 F (36.8 C), height '5\' 11"'$  (1.803 Jonathan), SpO2 97 %. Body mass index is 38.77 kg/Jonathan.  General: Cooperative, alert, well developed, in no acute distress. HEENT: Conjunctivae and lids unremarkable. Cardiovascular: Regular rhythm.  Lungs: Normal work of breathing. Neurologic: No focal deficits.   Lab Results  Component Value Date   CREATININE 0.88 05/20/2020   BUN 14 05/20/2020   NA 135 05/20/2020   K 3.7 05/20/2020   CL 92 (L) 05/20/2020   CO2 37 (H) 05/20/2020   Lab Results  Component Value Date   ALT 12 05/20/2020   AST 16 05/20/2020   ALKPHOS 60 05/20/2020   BILITOT 0.9 05/20/2020   Lab Results  Component Value Date   HGBA1C 6.0 05/20/2020   HGBA1C 5.9 (H) 03/10/2020   HGBA1C 5.7 (H) 09/27/2019   HGBA1C 5.9 05/09/2019   HGBA1C 6.1 (H) 01/04/2019   Lab Results  Component Value Date   INSULIN 19.1 03/10/2020   INSULIN 11.2 09/27/2019   INSULIN 46.3 (H) 08/28/2018   Lab Results  Component Value  Date   TSH 1.59 05/20/2020   Lab Results  Component Value Date   CHOL 164 03/10/2020   HDL 48 03/10/2020   LDLCALC 101 (H) 03/10/2020   TRIG 81 03/10/2020   CHOLHDL 4 05/09/2019   Lab Results  Component Value Date   VD25OH 42.5 03/10/2020   VD25OH 54.3 09/27/2019   VD25OH 35.21 05/09/2019   Lab Results  Component Value Date   WBC 7.3 05/20/2020   HGB 13.8 05/20/2020   HCT 41.6 05/20/2020   MCV 80.0  05/20/2020   PLT 198.0 05/20/2020   Lab Results  Component Value Date   IRON 150 12/15/2011   FERRITIN 9.2 (L) 12/15/2011    Obesity Behavioral Intervention:   Approximately 15 minutes were spent on the discussion below.  ASK: We discussed the diagnosis of obesity with Jonathan [name] today and [name] agreed to give [name] permission to discuss obesity behavioral modification therapy today.  ASSESS: Jonathan [name] has the diagnosis of obesity and his BMI today is 38.9. Jonathan [name] is in the action stage of change.   ADVISE: Jonathan [name] was educated on the multiple health risks of obesity as well as the benefit of weight loss to improve his health. He was advised of the need for long term treatment and the importance of lifestyle modifications to improve his current health and to decrease his risk of future health problems.  AGREE: Multiple dietary modification options and treatment options were discussed and Jonathan [name] agreed to follow the recommendations documented in the above note.  ARRANGE: Jonathan [name] was educated on the importance of frequent visits to treat obesity as outlined per CMS and USPSTF guidelines and agreed to schedule his next follow up appointment today.  Attestation Statements:   Reviewed by clinician on day of visit: allergies, medications, problem list, medical history, surgical history, family history, social history, and previous encounter notes.  I, Lizbeth Bark, RMA, am acting as Location manager for [employer], DO.   I have reviewed the above documentation for accuracy and completeness, and I agree with the above. [name], DO

## 2020-11-26 ENCOUNTER — Encounter (INDEPENDENT_AMBULATORY_CARE_PROVIDER_SITE_OTHER): Payer: Self-pay | Admitting: Bariatrics

## 2020-11-27 ENCOUNTER — Other Ambulatory Visit (INDEPENDENT_AMBULATORY_CARE_PROVIDER_SITE_OTHER): Payer: Self-pay | Admitting: Bariatrics

## 2020-11-27 DIAGNOSIS — E876 Hypokalemia: Secondary | ICD-10-CM

## 2020-12-17 ENCOUNTER — Ambulatory Visit (INDEPENDENT_AMBULATORY_CARE_PROVIDER_SITE_OTHER): Payer: Medicare Other | Admitting: Bariatrics

## 2020-12-22 ENCOUNTER — Other Ambulatory Visit (INDEPENDENT_AMBULATORY_CARE_PROVIDER_SITE_OTHER): Payer: Self-pay | Admitting: Bariatrics

## 2020-12-22 DIAGNOSIS — E559 Vitamin D deficiency, unspecified: Secondary | ICD-10-CM

## 2020-12-22 NOTE — Telephone Encounter (Signed)
Dr.Brown 

## 2020-12-23 NOTE — Telephone Encounter (Signed)
LAST APPOINTMENT DATE: 12/17/20 NEXT APPOINTMENT DATE: 01/05/21   [id number] [place] [facility] [id number]: [phone]: [phone] Patient is requesting a refill of the following medications: Pending Prescriptions:                       Disp   Refills   Vitamin D, Ergocalciferol, (DRISDOL) 1.25 *2 caps*0       Sig: TAKE ONE CAPSULE BY MOUTH EVERY TWO WEEKS   Date last filled: 11/24/20 Previously prescribed by Dr. BOwens Shark Lab Results      Component                Value               Date                      HGBA1C                   6.0                 05/20/2020                HGBA1C                   5.9 (H)             03/10/2020                HGBA1C                   5.7 (H)             09/27/2019           Lab Results      Component                Value               Date                      LDLCALC                  101 (H)             03/10/2020                CREATININE               0.88                05/20/2020           Lab Results      Component                Value               Date                      VD25OH                   42.5                03/10/2020                VD25OH                   54.3  09/27/2019                VD25OH                   35.21               05/09/2019            BP Readings from Last 3 Encounters: 11/24/20 : 137/74 11/03/20 : 130/77 09/23/20 : 129/79

## 2020-12-24 ENCOUNTER — Other Ambulatory Visit (INDEPENDENT_AMBULATORY_CARE_PROVIDER_SITE_OTHER): Payer: Self-pay | Admitting: Bariatrics

## 2020-12-24 DIAGNOSIS — E876 Hypokalemia: Secondary | ICD-10-CM

## 2020-12-29 DIAGNOSIS — M7061 Trochanteric bursitis, right hip: Secondary | ICD-10-CM | POA: Diagnosis not present

## 2020-12-29 DIAGNOSIS — M25562 Pain in left knee: Secondary | ICD-10-CM | POA: Diagnosis not present

## 2020-12-29 DIAGNOSIS — M25561 Pain in right knee: Secondary | ICD-10-CM | POA: Diagnosis not present

## 2020-12-29 DIAGNOSIS — M7062 Trochanteric bursitis, left hip: Secondary | ICD-10-CM | POA: Diagnosis not present

## 2021-01-05 ENCOUNTER — Emergency Department (HOSPITAL_BASED_OUTPATIENT_CLINIC_OR_DEPARTMENT_OTHER)
Admission: EM | Admit: 2021-01-05 | Discharge: 2021-01-05 | Disposition: A | Discharge status: Home / Self Care | Payer: Medicare Other | Attending: Emergency Medicine | Admitting: Emergency Medicine

## 2021-01-05 ENCOUNTER — Other Ambulatory Visit: Payer: Self-pay

## 2021-01-05 ENCOUNTER — Encounter (HOSPITAL_BASED_OUTPATIENT_CLINIC_OR_DEPARTMENT_OTHER): Payer: Self-pay | Admitting: *Deleted

## 2021-01-05 ENCOUNTER — Emergency Department (HOSPITAL_BASED_OUTPATIENT_CLINIC_OR_DEPARTMENT_OTHER): Payer: Medicare Other | Admitting: Radiology

## 2021-01-05 ENCOUNTER — Ambulatory Visit (INDEPENDENT_AMBULATORY_CARE_PROVIDER_SITE_OTHER): Payer: Medicare Other | Admitting: Bariatrics

## 2021-01-05 DIAGNOSIS — Z20822 Contact with and (suspected) exposure to covid-19: Secondary | ICD-10-CM | POA: Diagnosis not present

## 2021-01-05 DIAGNOSIS — M791 Myalgia, unspecified site: Secondary | ICD-10-CM | POA: Diagnosis not present

## 2021-01-05 DIAGNOSIS — R079 Chest pain, unspecified: Secondary | ICD-10-CM

## 2021-01-05 DIAGNOSIS — J069 Acute upper respiratory infection, unspecified: Secondary | ICD-10-CM | POA: Insufficient documentation

## 2021-01-05 DIAGNOSIS — R059 Cough, unspecified: Secondary | ICD-10-CM | POA: Diagnosis not present

## 2021-01-05 DIAGNOSIS — I10 Essential (primary) hypertension: Secondary | ICD-10-CM | POA: Insufficient documentation

## 2021-01-05 DIAGNOSIS — N390 Urinary tract infection, site not specified: Secondary | ICD-10-CM | POA: Diagnosis not present

## 2021-01-05 DIAGNOSIS — Z79899 Other long term (current) drug therapy: Secondary | ICD-10-CM | POA: Diagnosis not present

## 2021-01-05 DIAGNOSIS — R0602 Shortness of breath: Secondary | ICD-10-CM | POA: Diagnosis not present

## 2021-01-05 DIAGNOSIS — J029 Acute pharyngitis, unspecified: Secondary | ICD-10-CM | POA: Diagnosis not present

## 2021-01-05 DIAGNOSIS — Z8546 Personal history of malignant neoplasm of prostate: Secondary | ICD-10-CM | POA: Insufficient documentation

## 2021-01-05 DIAGNOSIS — Z96643 Presence of artificial hip joint, bilateral: Secondary | ICD-10-CM | POA: Diagnosis not present

## 2021-01-05 DIAGNOSIS — R5383 Other fatigue: Secondary | ICD-10-CM | POA: Diagnosis not present

## 2021-01-05 DIAGNOSIS — J45909 Unspecified asthma, uncomplicated: Secondary | ICD-10-CM | POA: Diagnosis not present

## 2021-01-05 DIAGNOSIS — Z96659 Presence of unspecified artificial knee joint: Secondary | ICD-10-CM | POA: Diagnosis not present

## 2021-01-05 LAB — RESP PANEL BY RT-PCR (FLU A&B, COVID) ARPGX2
Influenza A by PCR: NEGATIVE
Influenza B by PCR: NEGATIVE
SARS Coronavirus 2 by RT PCR: NEGATIVE

## 2021-01-05 NOTE — ED Notes (Signed)
Pt discharged home after verbalizing understanding of discharge instructions; nad noted. 

## 2021-01-05 NOTE — ED Provider Notes (Signed)
[facility] EMERGENCY DEPT Provider Note   CSN: [id number] Arrival date & time: 01/05/21  1037     History Chief Complaint  Patient presents with   Cough   Sore Throat   Weakness   Fatigue    Jonathan [name] is a 65 y.o. male.  That he hadPatient presents to ER chief complaint of generalized body aches fatigue and cough episode of diarrhea which was resolved.  He had episode of chest pain yesterday which lasted 2 to 3 seconds he states and then has resolved.  Currently denies any headache or chest pain.  No reports of fevers or vomiting.      Past Medical History:  Diagnosis Date   Anxiety    Asthma    BPH (benign prostatic hyperplasia)    ED (erectile dysfunction)    Elevated PSA    9.33   Esophageal reflux    Hearing loss    History of hip replacement    History of knee replacement    Hypertension    Lactose intolerance    Numbness in both legs    OSA on CPAP 2019   Pre-diabetes    Prostate cancer [facility]) Jan 2016   prostatectomy   Vitamin D deficiency     Patient Active Problem List   Diagnosis Date Noted   Vitamin D deficiency 01/08/2019   OSA on CPAP 09/12/2018   Obesity (BMI 35.0-39.9 without comorbidity) 09/12/2018   Chronic pulmonary hypertension ([place]) - noted on Echo 11/09/2017   Morbid obesity ([place]) 11/02/2017   Bradycardia 11/02/2017   Hyperlipidemia due to dietary fat intake 09/11/2017   Orthopnea 09/08/2017   DOE (dyspnea on exertion) 09/08/2017   Chest pain with moderate risk for cardiac etiology 09/08/2017   Malignant neoplasm of prostate ([place]) 04/16/2014   Weight gain 04/10/2014   Cephalalgia 04/10/2014   Back pain without radiation 01/22/2014   Prostate CA ([place]) 07/26/2012   Anemia 12/15/2011   ALLERGIC RHINITIS 04/17/2009   ERECTILE DYSFUNCTION 01/19/2008   ANXIETY 07/24/2007   Essential hypertension 12/09/2006    Past Surgical History:  Procedure Laterality Date   CORONARY CALCIUM SCORE / CARDIAC CT ANGIOGRAM   11/16/2019   Cor Ca++ Score ZERO. NO evidence of CAD. Dominant RCA-<PDA & PLVB with minimal (<25%) calcified plaque. LM no plaque -< LAD & LCx. LAD w/ large D1 - no plaque. Non-dom LCx with 1 Large OM1 - no plaque. Normal PV drainage. Normal LAA & PA size.    FOOT SURGERY     bilateral foot   NM MYOVIEW LTD  07/2007   Normal nuclear stress test.  Mild fixed defect in the anterior wall likely related to soft tissue attenuation.  Normal EF.  Fair exercise capacity.   PROSTATE BIOPSY  06/13/2012   right lat mid gleason 3+3=6   PROSTATECTOMY  2016   TOTAL HIP ARTHROPLASTY  2008 &2007   Right and left   TRANSTHORACIC ECHOCARDIOGRAM  09/2017   EF 55-60%. ??  Normal diastolic function.  No R WMA.  Moderate pulmonary hypertension-PA P estimated 59 mmHg.   TRANSTHORACIC ECHOCARDIOGRAM  11/13/2019    EF 55-60%. NO RWMA. Mild LV dilation & Gr 1 DD. MIld LA dilation. Normal valves. Normal CVP.       Family History  Problem Relation Age of Onset   Hypertension Mother    Colon cancer Mother    Obesity Mother    Prostate cancer Father 67       alive now age 40  Asthma Father    Heart disease Father        Pt is not sure of details (no MI)   Alcoholism Father    Obesity Father    Tuberculosis Brother    Lung cancer Maternal Grandmother        non-smoker    Social History   Tobacco Use   Smoking status: Never   Smokeless tobacco: Never  Vaping Use   Vaping Use: Never used  Substance Use Topics   Alcohol use: No   Drug use: No    Home Medications Prior to Admission medications   Medication Sig Start Date End Date Taking? Authorizing Provider  atenolol-chlorthalidone (TENORETIC) 50-25 MG tablet TAKE 1 TABLET BY MOUTH EVERY DAY 04/10/20  Yes Nafziger, Tommi Rumps, NP  potassium chloride (KLOR-CON) 10 MEQ tablet Take 1 tablet (10 mEq total) by mouth daily. 11/24/20  Yes [name] A, DO  Vitamin D, Ergocalciferol, (DRISDOL) 1.25 MG (50000 UNIT) CAPS capsule TAKE ONE CAPSULE BY MOUTH EVERY  TWO WEEKS 12/23/20   [name] A, DO    Allergies    Codeine, Cozaar [losartan potassium], and Norvasc [amlodipine]  Review of Systems   Review of Systems  Constitutional:  Negative for fever.  HENT:  Negative for ear pain and sore throat.   Eyes:  Negative for pain.  Respiratory:  Positive for cough.   Cardiovascular:  Positive for chest pain.  Gastrointestinal:  Negative for abdominal pain.  Genitourinary:  Negative for flank pain.  Musculoskeletal:  Negative for back pain.  Skin:  Negative for color change and rash.  Neurological:  Negative for syncope.  All other systems reviewed and are negative.  Physical Exam Updated Vital Signs BP (!) 131/107 (BP Location: Right Arm)   Pulse (!) 46   Temp 98.6 F (37 C) (Oral)   Resp 20   Ht 5\' 11"  (1.803 m)   Wt 127 kg   SpO2 94%   BMI 39.05 kg/m   Physical Exam Constitutional:      Appearance: He is well-developed.  HENT:     Head: Normocephalic.     Nose: Nose normal.  Eyes:     Extraocular Movements: Extraocular movements intact.  Cardiovascular:     Rate and Rhythm: Normal rate.  Pulmonary:     Effort: Pulmonary effort is normal.  Skin:    Coloration: Skin is not jaundiced.  Neurological:     Mental Status: He is alert. Mental status is at baseline.    ED Results / Procedures / Treatments   Labs (all labs ordered are listed, but only abnormal results are displayed) Labs Reviewed  RESP PANEL BY RT-PCR (FLU A&B, COVID) [id number]    EKG EKG Interpretation  Date/Time:  Monday January 05 2021 13:14:32 EDT Ventricular Rate:  47 PR Interval:  254 QRS Duration: 128 QT Interval:  502 QTC Calculation: 444 R Axis:   -40 Text Interpretation: Sinus bradycardia Prolonged PR interval RBBB and LAFB Left ventricular hypertrophy Baseline wander in lead(s) V3 Confirmed by Thamas Jaegers ([id number]) on 01/05/2021 1:53:21 PM  Radiology DG Chest 2 View  Result Date: 01/05/2021 CLINICAL DATA:  Shortness of breath, cough and  congestion. Sore throat and fatigue. EXAM: CHEST - 2 VIEW COMPARISON:  None. FINDINGS: Trachea is midline. Heart size is accentuated by technique. Lungs are clear. No pleural fluid. Right hemidiaphragm is elevated. Flowing anterior osteophytosis in the thoracic spine. IMPRESSION: No acute findings. Electronically Signed   By: Lorin Picket M.D.   On:  01/05/2021 13:46    Procedures Procedures   Medications Ordered in ED Medications - No data to display  ED Course  I have reviewed the triage vital signs and the nursing notes.  Pertinent labs & imaging results that were available during my care of the patient were reviewed by me and considered in my medical decision making (see chart for details).    MDM Rules/Calculators/A&P                           Patient clinically well-appearing at this time.  Presents with symptoms consistent with upper respiratory infection, however stating he had chest pain yesterday lasted 2 to 3 seconds.  COVID test is negative here in the ER.  Chest x-ray is unremarkable EKG shows sinus rhythm unchanged from prior EKGs with ST depression seen in inferior leads consistent with prior EKGs.  Rate is otherwise normal sinus rhythm.  Advising outpatient monitoring and follow-up with his doctor within the week.  Advising immediate return for worsening symptoms or any additional concerns.  Final Clinical Impression(s) / ED Diagnoses Final diagnoses:  Upper respiratory tract infection, unspecified type  Chest pain, unspecified type    Rx / DC Orders ED Discharge Orders     None        Luna Fuse, MD 01/05/21 1421

## 2021-01-05 NOTE — ED Notes (Signed)
Pt from home with body aches, fatigue, cough since Thursday. Pt reports he initially had diarrhea and fever, but that those resolved after a couple of days. Pt alert & oriented, nad noted.

## 2021-01-05 NOTE — Discharge Instructions (Addendum)
Call your primary care doctor or specialist as discussed in the next 2-3 days.   Return immediately back to the ER if:  Your symptoms worsen within the next 12-24 hours. You develop new symptoms such as new fevers, persistent vomiting, new pain, shortness of breath, or new weakness or numbness, or if you have any other concerns.  

## 2021-01-05 NOTE — ED Triage Notes (Signed)
Coughing, fatigue, headache, sore throat for 4 days.

## 2021-01-13 ENCOUNTER — Other Ambulatory Visit (INDEPENDENT_AMBULATORY_CARE_PROVIDER_SITE_OTHER): Payer: Self-pay | Admitting: Bariatrics

## 2021-01-13 DIAGNOSIS — E559 Vitamin D deficiency, unspecified: Secondary | ICD-10-CM

## 2021-01-14 ENCOUNTER — Other Ambulatory Visit (INDEPENDENT_AMBULATORY_CARE_PROVIDER_SITE_OTHER): Payer: Self-pay | Admitting: Bariatrics

## 2021-01-14 DIAGNOSIS — E876 Hypokalemia: Secondary | ICD-10-CM

## 2021-01-14 DIAGNOSIS — E559 Vitamin D deficiency, unspecified: Secondary | ICD-10-CM

## 2021-01-14 MED ORDER — VITAMIN D (ERGOCALCIFEROL) 1.25 MG (50000 UNIT) PO CAPS: ORAL_CAPSULE | ORAL | 0 refills | Status: DC

## 2021-01-14 MED ORDER — POTASSIUM CHLORIDE ER 10 MEQ PO TBCR: 10.0000 meq | EXTENDED_RELEASE_TABLET | Freq: Every day | ORAL | 0 refills | Status: DC

## 2021-01-14 NOTE — Telephone Encounter (Signed)
LAST APPOINTMENT DATE: 11/24/20 NEXT APPOINTMENT DATE: 01/29/21   [id number] [place] [place] - [place] [address] Phone: [phone] Fax: [phone]  Patient is requesting a refill of the following medications: Pending Prescriptions:                       Disp   Refills   potassium chloride (KLOR-CON) 10 MEQ tablet30 tab*0       Sig: Take 1 tablet (1/0/ mEq total) by mouth daily.   Vitamin D, Ergocalciferol, (DRISDOL) 1.25 *2 caps*0         Date last filled: 8/15,9/13 Previously prescribed by Dr. Owens Shark  Lab Results      Component                Value               Date                      HGBA1C                   6.0                 05/20/2020                HGBA1C                   5.9 (H)             03/10/2020                HGBA1C                   5.7 (H)             09/27/2019           Lab Results      Component                Value               Date                      LDLCALC                  101 (H)             03/10/2020                CREATININE               0.88                05/20/2020           Lab Results      Component                Value               Date                      VD25OH                   42.5                03/10/2020                VD25OH  54.3                09/27/2019                VD25OH                   35.21               05/09/2019            BP Readings from Last 3 Encounters: 01/05/21 : (!) 131/107 11/24/20 : 137/74 11/03/20 : 130/77

## 2021-01-29 ENCOUNTER — Encounter (INDEPENDENT_AMBULATORY_CARE_PROVIDER_SITE_OTHER): Payer: Self-pay | Admitting: Bariatrics

## 2021-01-29 ENCOUNTER — Other Ambulatory Visit: Payer: Self-pay

## 2021-01-29 ENCOUNTER — Ambulatory Visit (INDEPENDENT_AMBULATORY_CARE_PROVIDER_SITE_OTHER): Payer: Medicare Other | Admitting: Bariatrics

## 2021-01-29 VITALS — BP 126/74 | HR 51 | Temp 97.4°F | Ht 71.0 in | Wt 283.0 lb

## 2021-01-29 DIAGNOSIS — Z6841 Body Mass Index (BMI) 40.0 and over, adult: Secondary | ICD-10-CM

## 2021-01-29 DIAGNOSIS — I10 Essential (primary) hypertension: Secondary | ICD-10-CM | POA: Diagnosis not present

## 2021-01-29 DIAGNOSIS — E7849 Other hyperlipidemia: Secondary | ICD-10-CM | POA: Diagnosis not present

## 2021-01-29 NOTE — Progress Notes (Signed)
Chief Complaint:   OBESITY Jonathan [name] is here to discuss his progress with his obesity treatment plan along with follow-up of his obesity related diagnoses. Jonathan [name] is on the [employer] and states he is following his eating plan approximately 50% of the time. Jonathan [name] states he is doing cardio and weights for 60 minutes 2-3 times per week.  Today's visit was #: 71 Starting weight: 319 lbs Starting date: 08/28/2018 Today's weight: 283 lbs Today's date: 01/29/2021 Total lbs lost to date: 36 lbs Total lbs lost since last in-office visit: 0  Interim History: Jonathan [name] is up 5 lbs since his last visit. He states that he has been sick and just wants to get back on the plan.  Subjective:   1. Essential hypertension Jonathan [name]'s blood pressure is controlled.  2. Hyperlipidemia due to dietary fat intake Jonathan [name] is not currently on medications.   Assessment/Plan:   1. Essential hypertension Jonathan [name] will continue his medications. He is working on healthy weight loss and exercise to improve blood pressure control. We will watch for signs of hypotension as he continues his lifestyle modifications.  2. Hyperlipidemia due to dietary fat intake Cardiovascular risk and specific lipid/LDL goals reviewed.  We discussed several lifestyle modifications today and Jonathan [name] will continue to work on diet, exercise and weight loss efforts. Jonathan [name] will have zero trans fats and limit saturated fats. Orders and follow up as documented in patient record.   Counseling Intensive lifestyle modifications are the first line treatment for this issue. Dietary changes: Increase soluble fiber. Decrease simple carbohydrates. Exercise changes: Moderate to vigorous-intensity aerobic activity 150 minutes per week if tolerated. Lipid-lowering medications: see documented in medical record.   3. Obesity, current BMI 39.6 Jonathan [name] is currently in the action stage of change. As such, his goal is to continue with weight loss  efforts. He has agreed to the [employer].   Jonathan [name] will continue meal planning and intentional eating. He will get an accountability partner.   Exercise goals:  As is.  Behavioral modification strategies: increasing lean protein intake, decreasing simple carbohydrates, increasing vegetables, increasing water intake, decreasing eating out, no skipping meals, meal planning and cooking strategies, keeping healthy foods in the home, and planning for success.  Jonathan [name] has agreed to follow-up with our clinic in 4 weeks (fasting). He was informed of the importance of frequent follow-up visits to maximize his success with intensive lifestyle modifications for his multiple health conditions.   Objective:   Blood pressure 126/74, pulse (!) 51, temperature (!) 97.4 F (36.3 C), height 5\' 11"  (1.803 m), weight 283 lb (128.4 kg), SpO2 95 %. Body mass index is 39.47 kg/m.  General: Cooperative, alert, well developed, in no acute distress. HEENT: Conjunctivae and lids unremarkable. Cardiovascular: Regular rhythm.  Lungs: Normal work of breathing. Neurologic: No focal deficits.   Lab Results  Component Value Date   CREATININE 0.88 05/20/2020   BUN 14 05/20/2020   NA 135 05/20/2020   K 3.7 05/20/2020   CL 92 (L) 05/20/2020   CO2 37 (H) 05/20/2020   Lab Results  Component Value Date   ALT 12 05/20/2020   AST 16 05/20/2020   ALKPHOS 60 05/20/2020   BILITOT 0.9 05/20/2020   Lab Results  Component Value Date   HGBA1C 6.0 05/20/2020   HGBA1C 5.9 (H) 03/10/2020   HGBA1C 5.7 (H) 09/27/2019   HGBA1C 5.9 05/09/2019   HGBA1C 6.1 (H) 01/04/2019   Lab Results  Component Value Date   INSULIN 19.1 03/10/2020  INSULIN 11.2 09/27/2019   INSULIN 46.3 (H) 08/28/2018   Lab Results  Component Value Date   TSH 1.59 05/20/2020   Lab Results  Component Value Date   CHOL 164 03/10/2020   HDL 48 03/10/2020   LDLCALC 101 (H) 03/10/2020   TRIG 81 03/10/2020   CHOLHDL 4 05/09/2019   Lab  Results  Component Value Date   VD25OH 42.5 03/10/2020   VD25OH 54.3 09/27/2019   VD25OH 35.21 05/09/2019   Lab Results  Component Value Date   WBC 7.3 05/20/2020   HGB 13.8 05/20/2020   HCT 41.6 05/20/2020   MCV 80.0 05/20/2020   PLT 198.0 05/20/2020   Lab Results  Component Value Date   IRON 150 12/15/2011   FERRITIN 9.2 (L) 12/15/2011   Attestation Statements:   Reviewed by clinician on day of visit: allergies, medications, problem list, medical history, surgical history, family history, social history, and previous encounter notes.  I, Lizbeth Bark, RMA, am acting as Location manager for [employer], DO.   I have reviewed the above documentation for accuracy and completeness, and I agree with the above. [name], DO

## 2021-02-02 ENCOUNTER — Encounter (INDEPENDENT_AMBULATORY_CARE_PROVIDER_SITE_OTHER): Payer: Self-pay | Admitting: Bariatrics

## 2021-02-07 ENCOUNTER — Other Ambulatory Visit (INDEPENDENT_AMBULATORY_CARE_PROVIDER_SITE_OTHER): Payer: Self-pay | Admitting: Bariatrics

## 2021-02-07 DIAGNOSIS — E559 Vitamin D deficiency, unspecified: Secondary | ICD-10-CM

## 2021-02-08 ENCOUNTER — Other Ambulatory Visit: Payer: Self-pay | Admitting: Adult Health

## 2021-02-09 ENCOUNTER — Other Ambulatory Visit (INDEPENDENT_AMBULATORY_CARE_PROVIDER_SITE_OTHER): Payer: Self-pay | Admitting: Bariatrics

## 2021-02-09 DIAGNOSIS — E876 Hypokalemia: Secondary | ICD-10-CM

## 2021-02-09 DIAGNOSIS — E559 Vitamin D deficiency, unspecified: Secondary | ICD-10-CM

## 2021-02-09 DIAGNOSIS — G4733 Obstructive sleep apnea (adult) (pediatric): Secondary | ICD-10-CM | POA: Diagnosis not present

## 2021-02-09 MED ORDER — VITAMIN D (ERGOCALCIFEROL) 1.25 MG (50000 UNIT) PO CAPS: ORAL_CAPSULE | ORAL | 0 refills | Status: DC

## 2021-02-09 MED ORDER — POTASSIUM CHLORIDE ER 10 MEQ PO TBCR: 10.0000 meq | EXTENDED_RELEASE_TABLET | Freq: Every day | ORAL | 0 refills | Status: DC

## 2021-02-09 NOTE — Telephone Encounter (Signed)
LAST APPOINTMENT DATE: 01/29/21 NEXT APPOINTMENT DATE: 02/26/21   [id number] [place] [place] - [place] [address] [place] [address] Phone: [phone] Fax: [phone]  Patient is requesting a refill of the following medications: Pending Prescriptions:                       Disp   Refills   potassium chloride (KLOR-CON) 10 MEQ tablet30 tab*0       Sig: Take 1 tablet (10 mEq total) by mouth daily.   Vitamin D, Ergocalciferol, (DRISDOL) 1.25 *2 caps*0       Sig: Take 1 capsule every 14 days   Date last filled: 01/14/21 Previously prescribed by Dr. Owens Shark  Lab Results      Component                Value               Date                      HGBA1C                   6.0                 05/20/2020                HGBA1C                   5.9 (H)             03/10/2020                HGBA1C                   5.7 (H)             09/27/2019           Lab Results      Component                Value               Date                      LDLCALC                  101 (H)             03/10/2020                CREATININE               0.88                05/20/2020           Lab Results      Component                Value               Date                      VD25OH                   42.5                03/10/2020                VD25OH  54.3                09/27/2019                VD25OH                   35.21               05/09/2019            BP Readings from Last 3 Encounters: 01/29/21 : 126/74 01/05/21 : (!) 131/107 11/24/20 : 137/74

## 2021-02-16 ENCOUNTER — Ambulatory Visit: Payer: Medicare Other | Admitting: Cardiology

## 2021-02-26 ENCOUNTER — Ambulatory Visit (INDEPENDENT_AMBULATORY_CARE_PROVIDER_SITE_OTHER): Payer: Medicare Other | Admitting: Bariatrics

## 2021-02-26 ENCOUNTER — Encounter (INDEPENDENT_AMBULATORY_CARE_PROVIDER_SITE_OTHER): Payer: Self-pay | Admitting: Bariatrics

## 2021-02-26 ENCOUNTER — Other Ambulatory Visit: Payer: Self-pay

## 2021-02-26 VITALS — BP 134/73 | HR 50 | Temp 97.6°F | Ht 71.0 in | Wt 276.0 lb

## 2021-02-26 DIAGNOSIS — Z6841 Body Mass Index (BMI) 40.0 and over, adult: Secondary | ICD-10-CM

## 2021-02-26 DIAGNOSIS — E559 Vitamin D deficiency, unspecified: Secondary | ICD-10-CM

## 2021-02-26 DIAGNOSIS — R7303 Prediabetes: Secondary | ICD-10-CM | POA: Diagnosis not present

## 2021-02-26 DIAGNOSIS — E876 Hypokalemia: Secondary | ICD-10-CM | POA: Diagnosis not present

## 2021-02-26 DIAGNOSIS — E7849 Other hyperlipidemia: Secondary | ICD-10-CM

## 2021-02-26 MED ORDER — POTASSIUM CHLORIDE ER 10 MEQ PO TBCR: 10.0000 meq | EXTENDED_RELEASE_TABLET | Freq: Every day | ORAL | 0 refills | Status: DC

## 2021-02-26 MED ORDER — VITAMIN D (ERGOCALCIFEROL) 1.25 MG (50000 UNIT) PO CAPS: ORAL_CAPSULE | ORAL | 0 refills | Status: DC

## 2021-02-26 NOTE — Progress Notes (Signed)
Chief Complaint:   OBESITY Jonathan [name] is here to discuss his progress with his obesity treatment plan along with follow-up of his obesity related diagnoses. Jonathan [name] is on the [employer] and states he is following his eating plan approximately 99% of the time. Jonathan [name] states he is doing cardio and strength for 60 minutes 3 times per week.  Today's visit was #: 21 Starting weight: 319 lbs Starting date: 08/28/2018 Today's weight: 276 lbs Today's date: 02/26/2021 Total lbs lost to date: 43 lbs Total lbs lost since last in-office visit: 7 lbs  Interim History: Jonathan [name] is down 7 lbs since her last visit.  Subjective:   1. [name] is taking Vitamin D currently bi weekly.  2. [name] is currently taking  Potassium.   3. Other hyperlipidemia Jonathan [name] is not taking medications currently.  4. Prediabetes Jonathan [name] is not taking medications currently.  Assessment/Plan:   1. Vitamin D deficiency Low Vitamin D level contributes to fatigue and are associated with obesity, breast, and colon cancer. We will refill prescription Vitamin D 50,000 IU every week for 1 month with no refills and Dheeraj will follow-up for routine testing of Vitamin D, at least 2-3 times per year to avoid over-replacement. We will check Vitamin D today.  - Vitamin D, Ergocalciferol, (DRISDOL) 1.25 MG (50000 UNIT) CAPS capsule; Take 1 capsule every 14 days  Dispense: 2 capsule; Refill: 0 - VITAMIN D 25 Hydroxy (Vit-D Deficiency, Fractures)  2. [name] will refill Potassium Chloride for 1 month with no refills. We will check CMP today.  - potassium chloride (KLOR-CON) 10 MEQ tablet; Take 1 tablet (10 mEq total) by mouth daily.  Dispense: 30 tablet; Refill: 0 - Comprehensive metabolic panel  3. Other hyperlipidemia Cardiovascular risk and specific lipid/LDL goals reviewed.  We discussed several lifestyle modifications today and Santhiago will continue to work on diet, exercise and  weight loss efforts. We will check Lipid panel today. Orders and follow up as documented in patient record.   Counseling Intensive lifestyle modifications are the first line treatment for this issue. Dietary changes: Increase soluble fiber. Decrease simple carbohydrates. Exercise changes: Moderate to vigorous-intensity aerobic activity 150 minutes per week if tolerated. Lipid-lowering medications: see documented in medical record.  - Lipid Panel With LDL/HDL Ratio  4. Prediabetes Aarian will continue to work on weight loss, exercise, and decreasing simple carbohydrates to help decrease the risk of diabetes. We will check A1C and Insulin today.  - Insulin, random - Hemoglobin A1c  5. Obesity, current BMI 38.6 Jonathan [name] is currently in the action stage of change. As such, his goal is to continue with weight loss efforts. He has agreed to the [employer].   Jonathan [name] will continue meal planning. He will continue to adhere closely to the plan. Strategies for the holidays were provided.  Exercise goals:  Jonathan [name] will exercise 3 times per week.  Behavioral modification strategies: increasing lean protein intake, decreasing simple carbohydrates, increasing vegetables, increasing water intake, decreasing eating out, no skipping meals, meal planning and cooking strategies, keeping healthy foods in the home, and planning for success.  Jonathan [name] has agreed to follow-up with our clinic in 5 weeks. He was informed of the importance of frequent follow-up visits to maximize his success with intensive lifestyle modifications for his multiple health conditions.   Jonathan [name] was informed we would discuss his lab results at his next visit unless there is a critical issue that needs to be addressed sooner. Willliam agreed to keep  his next visit at the agreed upon time to discuss these results.  Objective:   Blood pressure 134/73, pulse (!) 50, temperature 97.6 F (36.4 C), height 5\' 11"  (1.803 m), weight 276 lb  (125.2 kg), SpO2 97 %. Body mass index is 38.49 kg/m.  General: Cooperative, alert, well developed, in no acute distress. HEENT: Conjunctivae and lids unremarkable. Cardiovascular: Regular rhythm.  Lungs: Normal work of breathing. Neurologic: No focal deficits.   Lab Results  Component Value Date   CREATININE 0.88 05/20/2020   BUN 14 05/20/2020   NA 135 05/20/2020   K 3.7 05/20/2020   CL 92 (L) 05/20/2020   CO2 37 (H) 05/20/2020   Lab Results  Component Value Date   ALT 12 05/20/2020   AST 16 05/20/2020   ALKPHOS 60 05/20/2020   BILITOT 0.9 05/20/2020   Lab Results  Component Value Date   HGBA1C 6.0 05/20/2020   HGBA1C 5.9 (H) 03/10/2020   HGBA1C 5.7 (H) 09/27/2019   HGBA1C 5.9 05/09/2019   HGBA1C 6.1 (H) 01/04/2019   Lab Results  Component Value Date   INSULIN 19.1 03/10/2020   INSULIN 11.2 09/27/2019   INSULIN 46.3 (H) 08/28/2018   Lab Results  Component Value Date   TSH 1.59 05/20/2020   Lab Results  Component Value Date   CHOL 164 03/10/2020   HDL 48 03/10/2020   LDLCALC 101 (H) 03/10/2020   TRIG 81 03/10/2020   CHOLHDL 4 05/09/2019   Lab Results  Component Value Date   VD25OH 42.5 03/10/2020   VD25OH 54.3 09/27/2019   VD25OH 35.21 05/09/2019   Lab Results  Component Value Date   WBC 7.3 05/20/2020   HGB 13.8 05/20/2020   HCT 41.6 05/20/2020   MCV 80.0 05/20/2020   PLT 198.0 05/20/2020   Lab Results  Component Value Date   IRON 150 12/15/2011   FERRITIN 9.2 (L) 12/15/2011   Attestation Statements:   Reviewed by clinician on day of visit: allergies, medications, problem list, medical history, surgical history, family history, social history, and previous encounter notes.  I, Lizbeth Bark, RMA, am acting as Location manager for [employer], DO.   I have reviewed the above documentation for accuracy and completeness, and I agree with the above. [name], DO

## 2021-02-27 LAB — LIPID PANEL WITH LDL/HDL RATIO
Cholesterol, Total: 183 mg/dL (ref 100–199)
HDL: 50 mg/dL (ref >39)
LDL Chol Calc (NIH): 121 mg/dL — ABNORMAL HIGH (ref 0–99)
LDL/HDL Ratio: 2.4 ratio (ref 0.0–3.6)
Triglycerides: 66 mg/dL (ref 0–149)
VLDL Cholesterol Cal: 12 mg/dL (ref 5–40)

## 2021-02-27 LAB — INSULIN, RANDOM: INSULIN: 9.6 u[IU]/mL (ref 2.6–24.9)

## 2021-02-27 LAB — VITAMIN D 25 HYDROXY (VIT D DEFICIENCY, FRACTURES): Vit D, 25-Hydroxy: 66.3 ng/mL (ref 30.0–100.0)

## 2021-02-27 LAB — HEMOGLOBIN A1C
Est. average glucose Bld gHb Est-mCnc: 123 mg/dL
Hgb A1c MFr Bld: 5.9 % — ABNORMAL HIGH (ref 4.8–5.6)

## 2021-02-27 LAB — COMPREHENSIVE METABOLIC PANEL
ALT: 18 IU/L (ref 0–44)
AST: 23 IU/L (ref 0–40)
Albumin/Globulin Ratio: 1.2 (ref 1.2–2.2)
Albumin: 4.2 g/dL (ref 3.8–4.8)
Alkaline Phosphatase: 54 IU/L (ref 44–121)
BUN/Creatinine Ratio: 12 (ref 10–24)
BUN: 10 mg/dL (ref 8–27)
Bilirubin Total: 0.9 mg/dL (ref 0.0–1.2)
CO2: 32 mmol/L — ABNORMAL HIGH (ref 20–29)
Calcium: 9.8 mg/dL (ref 8.6–10.2)
Chloride: 93 mmol/L — ABNORMAL LOW (ref 96–106)
Creatinine, Ser: 0.86 mg/dL (ref 0.76–1.27)
Globulin, Total: 3.5 g/dL (ref 1.5–4.5)
Glucose: 91 mg/dL (ref 70–99)
Potassium: 3.6 mmol/L (ref 3.5–5.2)
Sodium: 140 mmol/L (ref 134–144)
Total Protein: 7.7 g/dL (ref 6.0–8.5)
eGFR: 97 mL/min/{1.73_m2} (ref >59)

## 2021-03-06 ENCOUNTER — Other Ambulatory Visit (INDEPENDENT_AMBULATORY_CARE_PROVIDER_SITE_OTHER): Payer: Self-pay | Admitting: Bariatrics

## 2021-03-06 DIAGNOSIS — E876 Hypokalemia: Secondary | ICD-10-CM

## 2021-03-09 ENCOUNTER — Other Ambulatory Visit (INDEPENDENT_AMBULATORY_CARE_PROVIDER_SITE_OTHER): Payer: Self-pay | Admitting: Bariatrics

## 2021-03-09 DIAGNOSIS — E876 Hypokalemia: Secondary | ICD-10-CM

## 2021-03-09 NOTE — Telephone Encounter (Signed)
Last OV with Dr Brown 

## 2021-03-10 ENCOUNTER — Encounter (INDEPENDENT_AMBULATORY_CARE_PROVIDER_SITE_OTHER): Payer: Self-pay | Admitting: Bariatrics

## 2021-03-21 ENCOUNTER — Other Ambulatory Visit (INDEPENDENT_AMBULATORY_CARE_PROVIDER_SITE_OTHER): Payer: Self-pay | Admitting: Bariatrics

## 2021-03-21 DIAGNOSIS — E559 Vitamin D deficiency, unspecified: Secondary | ICD-10-CM

## 2021-03-30 ENCOUNTER — Encounter (INDEPENDENT_AMBULATORY_CARE_PROVIDER_SITE_OTHER): Payer: Self-pay | Admitting: Bariatrics

## 2021-03-30 ENCOUNTER — Other Ambulatory Visit (INDEPENDENT_AMBULATORY_CARE_PROVIDER_SITE_OTHER): Payer: Self-pay | Admitting: Bariatrics

## 2021-03-30 DIAGNOSIS — E559 Vitamin D deficiency, unspecified: Secondary | ICD-10-CM

## 2021-03-30 DIAGNOSIS — E876 Hypokalemia: Secondary | ICD-10-CM

## 2021-03-30 NOTE — Telephone Encounter (Signed)
Dr.Brown 

## 2021-03-31 MED ORDER — POTASSIUM CHLORIDE ER 10 MEQ PO TBCR: 10.0000 meq | EXTENDED_RELEASE_TABLET | Freq: Every day | ORAL | 0 refills | Status: DC

## 2021-03-31 MED ORDER — VITAMIN D (ERGOCALCIFEROL) 1.25 MG (50000 UNIT) PO CAPS: ORAL_CAPSULE | ORAL | 0 refills | Status: DC

## 2021-03-31 NOTE — Telephone Encounter (Signed)
LAST APPOINTMENT DATE: 02/26/21 NEXT APPOINTMENT DATE: 04/22/21   [id number] - [place], [place] [address] [address] Phone: [phone] Fax: [phone]  Patient is requesting a refill of the following medications: Requested Prescriptions   Pending Prescriptions Disp Refills   potassium chloride (KLOR-CON) 10 MEQ tablet 30 tablet 0    Sig: Take 1 tablet (10 mEq total) by mouth daily.   Vitamin D, Ergocalciferol, (DRISDOL) 1.25 MG (50000 UNIT) CAPS capsule 2 capsule 0    Sig: Take 1 capsule every 14 days    Date last filled: 02/26/21 Previously prescribed by Dr Owens Shark  Lab Results  Component Value Date   HGBA1C 5.9 (H) 02/26/2021   HGBA1C 6.0 05/20/2020   HGBA1C 5.9 (H) 03/10/2020   Lab Results  Component Value Date   LDLCALC 121 (H) 02/26/2021   CREATININE 0.86 02/26/2021   Lab Results  Component Value Date   VD25OH 66.3 02/26/2021   VD25OH 42.5 03/10/2020   VD25OH 54.3 09/27/2019    BP Readings from Last 3 Encounters:  02/26/21 134/73  01/29/21 126/74  01/05/21 (!) 131/107

## 2021-04-01 ENCOUNTER — Ambulatory Visit (INDEPENDENT_AMBULATORY_CARE_PROVIDER_SITE_OTHER): Payer: Medicare Other | Admitting: Bariatrics

## 2021-04-02 ENCOUNTER — Other Ambulatory Visit (INDEPENDENT_AMBULATORY_CARE_PROVIDER_SITE_OTHER): Payer: Self-pay | Admitting: Bariatrics

## 2021-04-02 DIAGNOSIS — E876 Hypokalemia: Secondary | ICD-10-CM

## 2021-04-02 NOTE — Telephone Encounter (Signed)
Dr.Brown 

## 2021-04-22 ENCOUNTER — Ambulatory Visit (INDEPENDENT_AMBULATORY_CARE_PROVIDER_SITE_OTHER): Payer: Medicare Other | Admitting: Bariatrics

## 2021-04-22 ENCOUNTER — Other Ambulatory Visit (INDEPENDENT_AMBULATORY_CARE_PROVIDER_SITE_OTHER): Payer: Self-pay | Admitting: Bariatrics

## 2021-04-22 DIAGNOSIS — E559 Vitamin D deficiency, unspecified: Secondary | ICD-10-CM

## 2021-05-05 ENCOUNTER — Other Ambulatory Visit (INDEPENDENT_AMBULATORY_CARE_PROVIDER_SITE_OTHER): Payer: Self-pay | Admitting: Bariatrics

## 2021-05-05 DIAGNOSIS — E876 Hypokalemia: Secondary | ICD-10-CM

## 2021-05-05 DIAGNOSIS — L218 Other seborrheic dermatitis: Secondary | ICD-10-CM | POA: Diagnosis not present

## 2021-05-11 DIAGNOSIS — M25562 Pain in left knee: Secondary | ICD-10-CM | POA: Diagnosis not present

## 2021-05-11 DIAGNOSIS — M25561 Pain in right knee: Secondary | ICD-10-CM | POA: Diagnosis not present

## 2021-05-13 DIAGNOSIS — G4733 Obstructive sleep apnea (adult) (pediatric): Secondary | ICD-10-CM | POA: Diagnosis not present

## 2021-05-27 ENCOUNTER — Encounter: Payer: Medicare Other | Admitting: Adult Health

## 2021-06-04 ENCOUNTER — Ambulatory Visit (INDEPENDENT_AMBULATORY_CARE_PROVIDER_SITE_OTHER): Payer: Medicare Other | Admitting: Bariatrics

## 2021-07-15 ENCOUNTER — Other Ambulatory Visit (INDEPENDENT_AMBULATORY_CARE_PROVIDER_SITE_OTHER): Payer: Self-pay | Admitting: Bariatrics

## 2021-07-15 DIAGNOSIS — E876 Hypokalemia: Secondary | ICD-10-CM

## 2021-08-12 DIAGNOSIS — G4733 Obstructive sleep apnea (adult) (pediatric): Secondary | ICD-10-CM | POA: Diagnosis not present

## 2021-08-20 ENCOUNTER — Ambulatory Visit: Payer: Medicare Other | Admitting: Orthopedic Surgery

## 2021-08-20 DIAGNOSIS — M6701 Short Achilles tendon (acquired), right ankle: Secondary | ICD-10-CM | POA: Diagnosis not present

## 2021-08-20 DIAGNOSIS — B353 Tinea pedis: Secondary | ICD-10-CM | POA: Diagnosis not present

## 2021-08-20 DIAGNOSIS — M6702 Short Achilles tendon (acquired), left ankle: Secondary | ICD-10-CM

## 2021-08-22 ENCOUNTER — Encounter: Payer: Self-pay | Admitting: Orthopedic Surgery

## 2021-08-22 NOTE — Progress Notes (Signed)
? ?Office Visit Note ?  ?Patient: Jonathan [name]           ?Date of Birth: 1956-02-15           ?MRN: [id number] ?Visit Date: 08/20/2021 ?             ?Requested by: Dorothyann Peng, NP ?[address] ?[place],  [place] [address] ?PCP: Dorothyann Peng, NP ? ?Chief Complaint  ?Patient presents with  ? Left Foot - Pain  ? ? ? ? ?HPI: ?Patient is a 66 year old gentleman who presents complaining of a fungal dry cracked skin on both the with pain across the arch with walking.  Patient states he is a borderline diabetic controlled with diet. ? ?Assessment & Plan: ?Visit Diagnoses:  ?1. Tinea pedis of both feet   ?2. Achilles tendon contracture, bilateral   ? ? ?Plan: Recommended a wall or alpaca sock.  Recommended Achilles stretching and this was demonstrated. ? ?Follow-Up Instructions: Return if symptoms worsen or fail to improve.  ? ?Ortho Exam ? ?Patient is alert, oriented, no adenopathy, well-dressed, normal affect, normal respiratory effort. ?Examination patient has a good dorsalis pedis pulse bilaterally.  He has dry cracked skin on both feet consistent with a fungal dermatitis.  There is no cellulitis no open wounds no drainage.  Patient has dorsiflexion only to neutral with his knees extended.  Patient has tenderness to palpation beneath the second metatarsal head due to overloading from the Achilles contracture there is callus that is pared. ? ?Imaging: ?No results found. ?No images are attached to the encounter. ? ?Labs: ?Lab Results  ?Component Value Date  ? HGBA1C 5.9 (H) 02/26/2021  ? HGBA1C 6.0 05/20/2020  ? HGBA1C 5.9 (H) 03/10/2020  ? LABURIC 7.5 05/20/2020  ? REPTSTATUS 07/23/2019 FINAL 07/20/2019  ? CULT >=100,000 COLONIES/mL ESCHERICHIA COLI (A) 07/20/2019  ? LABORGA ESCHERICHIA COLI (A) 07/20/2019  ? ? ? ?Lab Results  ?Component Value Date  ? ALBUMIN 4.2 02/26/2021  ? ALBUMIN 3.9 05/20/2020  ? ALBUMIN 3.9 03/10/2020  ? ? ?Lab Results  ?Component Value Date  ? MG 2.3 04/22/2007  ? ?Lab Results   ?Component Value Date  ? VD25OH 66.3 02/26/2021  ? VD25OH 42.5 03/10/2020  ? VD25OH 54.3 09/27/2019  ? ? ?No results found for: PREALBUMIN ? ?  Latest Ref Rng & Units 05/20/2020  ?  2:00 PM 05/09/2019  ?  7:49 AM 04/06/2018  ?  8:48 AM  ?CBC EXTENDED  ?WBC 4.0 - 10.5 K/uL 7.3   11.2   9.7    ?RBC 4.22 - 5.81 Mil/uL 5.19   5.10   5.02    ?Hemoglobin 13.0 - 17.0 g/dL 13.8   13.5   13.4    ?HCT 39.0 - 52.0 % 41.6   41.6   41.3    ?Platelets 150.0 - 400.0 K/uL 198.0   282.0   302.0    ?NEUT# 1.4 - 7.7 K/uL 4.7   7.3   6.5    ?Lymph# 0.7 - 4.0 K/uL 1.7   2.5   2.0    ? ? ? ?There is no height or weight on file to calculate BMI. ? ?Orders:  ?No orders of the defined types were placed in this encounter. ? ?No orders of the defined types were placed in this encounter. ? ? ? Procedures: ?No procedures performed ? ?Clinical Data: ?No additional findings. ? ?ROS: ? ?All other systems negative, except as noted in the HPI. ?Review of Systems ? ?Objective: ?Vital  Signs: There were no vitals taken for this visit. ? ?Specialty Comments:  ?No specialty comments available. ? ?PMFS History: ?Patient Active Problem List  ? Diagnosis Date Noted  ? Vitamin D deficiency 01/08/2019  ? OSA on CPAP 09/12/2018  ? Obesity (BMI 35.0-39.9 without comorbidity) 09/12/2018  ? Chronic pulmonary hypertension ([place]) - noted on Echo 11/09/2017  ? Morbid obesity ([place]) 11/02/2017  ? Bradycardia 11/02/2017  ? Hyperlipidemia due to dietary fat intake 09/11/2017  ? Orthopnea 09/08/2017  ? DOE (dyspnea on exertion) 09/08/2017  ? Chest pain with moderate risk for cardiac etiology 09/08/2017  ? Malignant neoplasm of prostate ([place]) 04/16/2014  ? Weight gain 04/10/2014  ? Cephalalgia 04/10/2014  ? Back pain without radiation 01/22/2014  ? Prostate CA ([place]) 07/26/2012  ? Anemia 12/15/2011  ? ALLERGIC RHINITIS 04/17/2009  ? ERECTILE DYSFUNCTION 01/19/2008  ? ANXIETY 07/24/2007  ? Essential hypertension 12/09/2006  ? ?Past Medical History:  ?Diagnosis Date  ? Anxiety    ? Asthma   ? BPH (benign prostatic hyperplasia)   ? ED (erectile dysfunction)   ? Elevated PSA   ? 9.33  ? Esophageal reflux   ? Hearing loss   ? History of hip replacement   ? History of knee replacement   ? Hypertension   ? Lactose intolerance   ? Numbness in both legs   ? OSA on CPAP 2019  ? Pre-diabetes   ? Prostate cancer [facility]) Jan 2016  ? prostatectomy  ? Vitamin D deficiency   ?  ?Family History  ?Problem Relation Age of Onset  ? Hypertension Mother   ? Colon cancer Mother   ? Obesity Mother   ? Prostate cancer Father 78  ?     alive now age 25  ? Asthma Father   ? Heart disease Father   ?     Pt is not sure of details (no MI)  ? Alcoholism Father   ? Obesity Father   ? Tuberculosis Brother   ? Lung cancer Maternal Grandmother   ?     non-smoker  ?  ?Past Surgical History:  ?Procedure Laterality Date  ? CORONARY CALCIUM SCORE / CARDIAC CT ANGIOGRAM  11/16/2019  ? Cor Ca++ Score ZERO. NO evidence of CAD. Dominant RCA-<PDA & PLVB with minimal (<25%) calcified plaque. LM no plaque -< LAD & LCx. LAD w/ large D1 - no plaque. Non-dom LCx with 1 Large OM1 - no plaque. Normal PV drainage. Normal LAA & PA size.   ? FOOT SURGERY    ? bilateral foot  ? NM MYOVIEW LTD  07/2007  ? Normal nuclear stress test.  Mild fixed defect in the anterior wall likely related to soft tissue attenuation.  Normal EF.  Fair exercise capacity.  ? PROSTATE BIOPSY  06/13/2012  ? right lat mid gleason 3+3=6  ? PROSTATECTOMY  2016  ? TOTAL HIP ARTHROPLASTY  2008 &2007  ? Right and left  ? TRANSTHORACIC ECHOCARDIOGRAM  09/2017  ? EF 55-60%. ??  Normal diastolic function.  No R WMA.  Moderate pulmonary hypertension-PA P estimated 59 mmHg.  ? TRANSTHORACIC ECHOCARDIOGRAM  11/13/2019  ?  EF 55-60%. NO RWMA. Mild LV dilation & Gr 1 DD. MIld LA dilation. Normal valves. Normal CVP.  ? ?Social History  ? ?Occupational History  ? Occupation: Geophysicist/field seismologist  ?  Employer: [employer]  ?Tobacco Use  ? Smoking status: Never  ? Smokeless tobacco: Never  ?Vaping Use  ?  Vaping Use: Never used  ?Substance and Sexual  Activity  ? Alcohol use: No  ? Drug use: No  ? Sexual activity: Not on file  ? ? ? ? ? ?

## 2021-09-14 ENCOUNTER — Emergency Department (HOSPITAL_BASED_OUTPATIENT_CLINIC_OR_DEPARTMENT_OTHER)
Admission: EM | Admit: 2021-09-14 | Discharge: 2021-09-14 | Disposition: A | Discharge status: Home / Self Care | Payer: Medicare Other | Attending: Emergency Medicine | Admitting: Emergency Medicine

## 2021-09-14 ENCOUNTER — Other Ambulatory Visit: Payer: Self-pay

## 2021-09-14 ENCOUNTER — Encounter (HOSPITAL_BASED_OUTPATIENT_CLINIC_OR_DEPARTMENT_OTHER): Payer: Self-pay | Admitting: Obstetrics and Gynecology

## 2021-09-14 ENCOUNTER — Emergency Department (HOSPITAL_BASED_OUTPATIENT_CLINIC_OR_DEPARTMENT_OTHER): Payer: Medicare Other

## 2021-09-14 DIAGNOSIS — M79604 Pain in right leg: Secondary | ICD-10-CM | POA: Insufficient documentation

## 2021-09-14 DIAGNOSIS — M79661 Pain in right lower leg: Secondary | ICD-10-CM | POA: Diagnosis not present

## 2021-09-14 DIAGNOSIS — M7989 Other specified soft tissue disorders: Secondary | ICD-10-CM | POA: Diagnosis not present

## 2021-09-14 NOTE — ED Triage Notes (Signed)
Patient reports to the ER to rule out a blood clot in the right leg. Patient reports he felt like it was weaker a few days ago and then that seemed to improve. Patient denies falls.

## 2021-09-14 NOTE — ED Provider Notes (Signed)
[facility] EMERGENCY DEPT Provider Note   CSN: [id number] Arrival date & time: 09/14/21  1238     History  Chief Complaint  Patient presents with  . Leg Pain    Jonathan [name] is a 66 y.o. male presents to the ED at the referral of his physical therapist to rule out a blood clot in his right leg.  Patient states that he goes to physical therapy due to bilateral hip replacements, and told his physical therapist today that he was having pain of the right posterolateral calf that radiated up to about mid lateral thigh.  No known trauma or injury.  PT recommended he come to the emergency department rule out blood clot before proceeding with rehab.  Patient has no prior history of blood clot.  He denies chest pain, shortness of breath, swelling, numbness and tingling.   Leg Pain     Home Medications Prior to Admission medications   Medication Sig Start Date End Date Taking? Authorizing Provider  atenolol-chlorthalidone (TENORETIC) 50-25 MG tablet TAKE 1 TABLET BY MOUTH EVERY DAY 02/10/21   Nafziger, Tommi Rumps, NP  potassium chloride (KLOR-CON) 10 MEQ tablet Take 1 tablet (10 mEq total) by mouth daily. 03/31/21   Georgia Lopes, DO  Vitamin D, Ergocalciferol, (DRISDOL) 1.25 MG (50000 UNIT) CAPS capsule Take 1 capsule every 14 days 03/31/21   [name] A, DO      Allergies    Codeine, Cozaar [losartan potassium], and Norvasc [amlodipine]    Review of Systems   Review of Systems  Physical Exam Updated Vital Signs BP 129/65 (BP Location: Right Arm)   Pulse (!) 50   Temp 98.3 F (36.8 C) (Oral)   Resp (!) 22   Ht '5\' 11"'$  (1.803 m)   Wt 135.6 kg   SpO2 97%   BMI 41.70 kg/m  Physical Exam  ED Results / Procedures / Treatments   Labs (all labs ordered are listed, but only abnormal results are displayed) Labs Reviewed - No data to display  EKG None  Radiology No results found.  Procedures Procedures    Medications Ordered in ED Medications - No data to  display  ED Course/ Medical Decision Making/ A&P                           Medical Decision Making  Social determinants of health:  Social History   Socioeconomic History  . Marital status: Married    Spouse name: Not on file  . Number of children: 2  . Years of education: Not on file  . Highest education level: Not on file  Occupational History  . Occupation: Education administrator: [employer]  Tobacco Use  . Smoking status: Never    Passive exposure: Never  . Smokeless tobacco: Never  Vaping Use  . Vaping Use: Never used  Substance and Sexual Activity  . Alcohol use: No  . Drug use: No  . Sexual activity: Yes  Other Topics Concern  . Not on file  Social History Narrative  . Not on file   Social Determinants of Health   Financial Resource Strain: Not on file  Food Insecurity: Not on file  Transportation Needs: Not on file  Physical Activity: Not on file  Stress: Not on file  Social Connections: Not on file  Intimate Partner Violence: Not on file     Initial impression:  This patient presents to the ED for concern of ***,  this involves an extensive number of treatment options, and is a complaint that carries with it a high risk of complications and morbidity.   Differentials include ***.   Comorbidities affecting care:  ***  Additional history obtained: ***  Lab Tests  I Ordered, reviewed, and interpreted labs and EKG.  The pertinent results include:  ***  Imaging Studies ordered:  I ordered imaging studies including  ***  I independently visualized and interpreted imaging and I agree with the radiologist interpretation.   EKG: ***  Cardiac Monitoring:  The patient was maintained on a cardiac monitor.  I personally viewed and interpreted the cardiac monitored which showed an underlying rhythm of: ***   Medicines ordered and prescription drug management:  I ordered medication including: ***   Reevaluation of the patient after these medicines showed  that the patient {resolved/improved/worsened:23923::"improved"} I have reviewed the patients home medicines and have made adjustments as needed   Critical Interventions:  ***  Consultations Obtained:  I requested consultation with ***,  and discussed lab and imaging findings as well as pertinent plan - they recommend: ***   ED Course/Re-evaluation: ***  Disposition:  After consideration of the diagnostic results, physical exam, history and the patients response to treatment feel that the patent would benefit from ***.   ***: Plan and management as described above. Discharged home in good condition.   {Document critical care time when appropriate:1} {Document review of labs and clinical decision tools ie heart score, Chads2Vasc2 etc:1}  {Document your independent review of radiology images, and any outside records:1} {Document your discussion with family members, caretakers, and with consultants:1} {Document social determinants of health affecting pt's care:1} {Document your decision making why or why not admission, treatments were needed:1} Final Clinical Impression(s) / ED Diagnoses Final diagnoses:  None    Rx / DC Orders ED Discharge Orders     None

## 2021-09-14 NOTE — Discharge Instructions (Signed)
Your ultrasound was negative for blood clot or cyst behind the knee.  I suspect that your pain is muscular in nature based on my exam and your symptoms.  Follow-up with your primary care doctor and continue to go to physical therapy.  You can take Motrin or Tylenol as needed for discomfort.

## 2021-10-07 ENCOUNTER — Telehealth: Payer: Self-pay | Admitting: Adult Health

## 2021-10-07 NOTE — Telephone Encounter (Signed)
Left message for patient to call back and schedule Medicare Annual Wellness Visit (AWV) either virtually or in office. Left  my Jonathan [name] number [phone]   Last AWV 07/14/20  please schedule at anytime with [facility] Nurse Health Advisor 1 or 2

## 2021-10-12 ENCOUNTER — Ambulatory Visit (INDEPENDENT_AMBULATORY_CARE_PROVIDER_SITE_OTHER): Payer: Medicare Other

## 2021-10-12 VITALS — Ht 71.0 in | Wt 276.0 lb

## 2021-10-12 DIAGNOSIS — Z0000 Encounter for general adult medical examination without abnormal findings: Secondary | ICD-10-CM | POA: Diagnosis not present

## 2021-10-12 NOTE — Patient Instructions (Addendum)
Jonathan [name] , Thank you for taking time to come for your Medicare Wellness Visit. I appreciate your ongoing commitment to your health goals. Please review the following plan we discussed and let me know if I can assist you in the future.   These are the goals we discussed:  Goals       Develop a Weight Loss Readiness Plan       Patient seen health and wellness with Dr. Owens Shark for weight loss,  pt goal to lose 50lbs      Increase physical activity (pt-stated)        This is a list of the screening recommended for you and due dates:  Health Maintenance  Topic Date Due   Colon Cancer Screening  10/13/2022*   Tetanus Vaccine  10/13/2022*   Flu Shot  11/10/2021   Hepatitis C Screening: USPSTF Recommendation to screen - Ages 18-79 yo.  Completed   HIV Screening  Completed   Zoster (Shingles) Vaccine  Completed   HPV Vaccine  Aged Out   COVID-19 Vaccine  Discontinued  *Topic was postponed. The date shown is not the original due date.    Advanced directives: Yes  Conditions/risks identified: None  Next appointment: Follow up in one year for your annual wellness visit.    Preventive Care 33 Years and Older, Male Preventive care refers to lifestyle choices and visits with your health care provider that can promote health and wellness. What does preventive care include? A yearly physical exam. This is also called an annual well check. Dental exams once or twice a year. Routine eye exams. Ask your health care provider how often you should have your eyes checked. Personal lifestyle choices, including: Daily care of your teeth and gums. Regular physical activity. Eating a healthy diet. Avoiding tobacco and drug use. Limiting alcohol use. Practicing safe sex. Taking low doses of aspirin every day. Taking vitamin and mineral supplements as recommended by your health care provider. What happens during an annual well check? The services and screenings done by your health care  provider during your annual well check will depend on your age, overall health, lifestyle risk factors, and family history of disease. Counseling  Your health care provider may ask you questions about your: Alcohol use. Tobacco use. Drug use. Emotional well-being. Home and relationship well-being. Sexual activity. Eating habits. History of falls. Memory and ability to understand (cognition). Work and work Statistician. Screening  You may have the following tests or measurements: Height, weight, and BMI. Blood pressure. Lipid and cholesterol levels. These may be checked every 5 years, or more frequently if you are over 29 years old. Skin check. Lung cancer screening. You may have this screening every year starting at age 52 if you have a 30-pack-year history of smoking and currently smoke or have quit within the past 15 years. Fecal occult blood test (FOBT) of the stool. You may have this test every year starting at age 48. Flexible sigmoidoscopy or colonoscopy. You may have a sigmoidoscopy every 5 years or a colonoscopy every 10 years starting at age 27. Prostate cancer screening. Recommendations will vary depending on your family history and other risks. Hepatitis C blood test. Hepatitis B blood test. Sexually transmitted disease (STD) testing. Diabetes screening. This is done by checking your blood sugar (glucose) after you have not eaten for a while (fasting). You may have this done every 1-3 years. Abdominal aortic aneurysm (AAA) screening. You may need this if you are a current or former  smoker. Osteoporosis. You may be screened starting at age 24 if you are at high risk. Talk with your health care provider about your test results, treatment options, and if necessary, the need for more tests. Vaccines  Your health care provider may recommend certain vaccines, such as: Influenza vaccine. This is recommended every year. Tetanus, diphtheria, and acellular pertussis (Tdap, Td)  vaccine. You may need a Td booster every 10 years. Zoster vaccine. You may need this after age 85. Pneumococcal 13-valent conjugate (PCV13) vaccine. One dose is recommended after age 65. Pneumococcal polysaccharide (PPSV23) vaccine. One dose is recommended after age 69. Talk to your health care provider about which screenings and vaccines you need and how often you need them. This information is not intended to replace advice given to you by your health care provider. Make sure you discuss any questions you have with your health care provider. Document Released: 04/25/2015 Document Revised: 12/17/2015 Document Reviewed: 01/28/2015 Elsevier Interactive Patient Education  2017 [place] Prevention in the Home Falls can cause injuries. They can happen to people of all ages. There are many things you can do to make your home safe and to help prevent falls. What can I do on the outside of my home? Regularly fix the edges of walkways and driveways and fix any cracks. Remove anything that might make you trip as you walk through a door, such as a raised step or threshold. Trim any bushes or trees on the path to your home. Use bright outdoor lighting. Clear any walking paths of anything that might make someone trip, such as rocks or tools. Regularly check to see if handrails are loose or broken. Make sure that both sides of any steps have handrails. Any raised decks and porches should have guardrails on the edges. Have any leaves, snow, or ice cleared regularly. Use sand or salt on walking paths during winter. Clean up any spills in your garage right away. This includes oil or grease spills. What can I do in the bathroom? Use night lights. Install grab bars by the toilet and in the tub and shower. Do not use towel bars as grab bars. Use non-skid mats or decals in the tub or shower. If you need to sit down in the shower, use a plastic, non-slip stool. Keep the floor dry. Clean up any  water that spills on the floor as soon as it happens. Remove soap buildup in the tub or shower regularly. Attach bath mats securely with double-sided non-slip rug tape. Do not have throw rugs and other things on the floor that can make you trip. What can I do in the bedroom? Use night lights. Make sure that you have a light by your bed that is easy to reach. Do not use any sheets or blankets that are too big for your bed. They should not hang down onto the floor. Have a firm chair that has side arms. You can use this for support while you get dressed. Do not have throw rugs and other things on the floor that can make you trip. What can I do in the kitchen? Clean up any spills right away. Avoid walking on wet floors. Keep items that you use a lot in easy-to-reach places. If you need to reach something above you, use a strong step stool that has a grab bar. Keep electrical cords out of the way. Do not use floor polish or wax that makes floors slippery. If you must use wax, use non-skid  floor wax. Do not have throw rugs and other things on the floor that can make you trip. What can I do with my stairs? Do not leave any items on the stairs. Make sure that there are handrails on both sides of the stairs and use them. Fix handrails that are broken or loose. Make sure that handrails are as [place] as the stairways. Check any carpeting to make sure that it is firmly attached to the stairs. Fix any carpet that is loose or worn. Avoid having throw rugs at the top or bottom of the stairs. If you do have throw rugs, attach them to the floor with carpet tape. Make sure that you have a light switch at the top of the stairs and the bottom of the stairs. If you do not have them, ask someone to add them for you. What else can I do to help prevent falls? Wear shoes that: Do not have high heels. Have rubber bottoms. Are comfortable and fit you well. Are closed at the toe. Do not wear sandals. If you use a  stepladder: Make sure that it is fully opened. Do not climb a closed stepladder. Make sure that both sides of the stepladder are locked into place. Ask someone to hold it for you, if possible. Clearly mark and make sure that you can see: Any grab bars or handrails. First and last steps. Where the edge of each step is. Use tools that help you move around (mobility aids) if they are needed. These include: Canes. Walkers. Scooters. Crutches. Turn on the lights when you go into a dark area. Replace any light bulbs as soon as they burn out. Set up your furniture so you have a clear path. Avoid moving your furniture around. If any of your floors are uneven, fix them. If there are any pets around you, be aware of where they are. Review your medicines with your doctor. Some medicines can make you feel dizzy. This can increase your chance of falling. Ask your doctor what other things that you can do to help prevent falls. This information is not intended to replace advice given to you by your health care provider. Make sure you discuss any questions you have with your health care provider. Document Released: 01/23/2009 Document Revised: 09/04/2015 Document Reviewed: 05/03/2014 Elsevier Interactive Patient Education  2017 [employer].

## 2021-10-12 NOTE — Progress Notes (Signed)
Subjective:   Jonathan [name] is a 66 y.o. male who presents for Medicare Annual/Subsequent preventive examination.  Review of Systems    Virtual Visit via Telephone Note  I connected with  Jonathan [name] on 10/12/21 at 11:30 AM EDT by telephone and verified that I am speaking with the correct person using two identifiers.  Location: Patient: Home Provider: Office Persons participating in the virtual visit: patient/Nurse Health Advisor   I discussed the limitations, risks, security and privacy concerns of performing an evaluation and management service by telephone and the availability of in person appointments. The patient expressed understanding and agreed to proceed.  Interactive audio and video telecommunications were attempted between this nurse and patient, however failed, due to patient having technical difficulties OR patient did not have access to video capability.  We continued and completed visit with audio only.  Some vital signs may be absent or patient reported.   Criselda Peaches, LPN  Cardiac Risk Factors include: advanced age (>74mn, >>64women);hypertension;male gender     Objective:    Today's Vitals   10/12/21 1131  Weight: 276 lb (125.2 kg)  Height: '5\' 11"'$  (1.803 m)   Body mass index is 38.49 kg/m.     10/12/2021   11:41 AM 09/14/2021   12:56 PM 01/05/2021   11:12 AM 07/14/2020    8:34 AM 07/20/2019    7:03 PM 12/26/2017    9:04 PM 10/02/2015   10:41 AM  Advanced Directives  Does Patient Have a Medical Advance Directive? Yes No Yes Yes No Yes Yes  Type of [place]  [place] Living will  Living will [place] will  Does patient want to make changes to medical advance directive? No - Patient declined      No - Patient declined  Copy of [place] Chart? No - copy requested      No - copy requested  Would patient like information  on creating a medical advance directive?  No - Patient declined   No - Patient declined      Current Medications (verified) Outpatient Encounter Medications as of 10/12/2021  Medication Sig   atenolol-chlorthalidone (TENORETIC) 50-25 MG tablet TAKE 1 TABLET BY MOUTH EVERY DAY   potassium chloride (KLOR-CON) 10 MEQ tablet Take 1 tablet (10 mEq total) by mouth daily.   Vitamin D, Ergocalciferol, (DRISDOL) 1.25 MG (50000 UNIT) CAPS capsule Take 1 capsule every 14 days   No facility-administered encounter medications on file as of 10/12/2021.    Allergies (verified) Codeine, Cozaar [losartan potassium], and Norvasc [amlodipine]   History: Past Medical History:  Diagnosis Date   Anxiety    Asthma    BPH (benign prostatic hyperplasia)    ED (erectile dysfunction)    Elevated PSA    9.33   Esophageal reflux    Hearing loss    History of hip replacement    History of knee replacement    Hypertension    Lactose intolerance    Numbness in both legs    OSA on CPAP 2019   Pre-diabetes    Prostate cancer ([facility] Jan 2016   prostatectomy   Vitamin D deficiency    Past Surgical History:  Procedure Laterality Date   CORONARY CALCIUM SCORE / CARDIAC CT ANGIOGRAM  11/16/2019   Cor Ca++ Score ZERO. NO evidence of CAD. Dominant RCA-<PDA & PLVB with minimal (<25%) calcified plaque. LM no plaque -<  LAD & LCx. LAD w/ large D1 - no plaque. Non-dom LCx with 1 Large OM1 - no plaque. Normal PV drainage. Normal LAA & PA size.    FOOT SURGERY     bilateral foot   NM MYOVIEW LTD  07/2007   Normal nuclear stress test.  Mild fixed defect in the anterior wall likely related to soft tissue attenuation.  Normal EF.  Fair exercise capacity.   PROSTATE BIOPSY  06/13/2012   right lat mid gleason 3+3=6   PROSTATECTOMY  2016   TOTAL HIP ARTHROPLASTY  2008 &2007   Right and left   TRANSTHORACIC ECHOCARDIOGRAM  09/2017   EF 55-60%. ??  Normal diastolic function.  No R WMA.  Moderate pulmonary hypertension-PA P  estimated 59 mmHg.   TRANSTHORACIC ECHOCARDIOGRAM  11/13/2019    EF 55-60%. NO RWMA. Mild LV dilation & Gr 1 DD. MIld LA dilation. Normal valves. Normal CVP.   Family History  Problem Relation Age of Onset   Hypertension Mother    Colon cancer Mother    Obesity Mother    Prostate cancer Father 69       alive now age 16   Asthma Father    Heart disease Father        Pt is not sure of details (no MI)   Alcoholism Father    Obesity Father    Tuberculosis Brother    Lung cancer Maternal Grandmother        non-smoker   Social History   Socioeconomic History   Marital status: Married    Spouse name: Not on file   Number of children: 2   Years of education: Not on file   Highest education level: Not on file  Occupational History   Occupation: Education administrator: [employer]  Tobacco Use   Smoking status: Never    Passive exposure: Never   Smokeless tobacco: Never  Vaping Use   Vaping Use: Never used  Substance and Sexual Activity   Alcohol use: No   Drug use: No   Sexual activity: Yes  Other Topics Concern   Not on file  Social History Narrative   Not on file   Social Determinants of Health   Financial Resource Strain: Low Risk  (10/12/2021)   Overall Financial Resource Strain (CARDIA)    Difficulty of Paying Living Expenses: Not hard at all  Food Insecurity: No Food Insecurity (10/12/2021)   Hunger Vital Sign    Worried About Running Out of Food in the Last Year: Never true    Hurricane in the Last Year: Never true  Transportation Needs: No Transportation Needs (10/12/2021)   PRAPARE - Hydrologist (Medical): No    Lack of Transportation (Non-Medical): No  Physical Activity: Unknown (10/12/2021)   Exercise Vital Sign    Days of Exercise per Week: Patient refused    Minutes of Exercise per Session: Patient refused  Stress: No Stress Concern Present (10/12/2021)   [place]     Feeling of Stress : Not at all  Social Connections: [place] (10/12/2021)   Social Connection and Isolation Panel [NHANES]    Frequency of Communication with Friends and Family: More than three times a week    Frequency of Social Gatherings with Friends and Family: More than three times a week    Attends Religious Services: More than 4 times per year    Active Member  of Clubs or Organizations: Yes    Attends Archivist Meetings: More than 4 times per year    Marital Status: Married    Tobacco Counseling Counseling given: Not Answered   Clinical Intake: How often do you need to have someone help you when you read instructions, pamphlets, or other written materials from your doctor or pharmacy?: 1 - Never  Diabetic?  No  Interpreter Needed?: NoActivities of Daily Living    10/12/2021   11:38 AM  In your present state of health, do you have any difficulty performing the following activities:  Hearing? 0  Vision? 0  Difficulty concentrating or making decisions? 0  Walking or climbing stairs? 0  Dressing or bathing? 0  Doing errands, shopping? 0  Preparing Food and eating ? N  Using the Toilet? N  In the past six months, have you accidently leaked urine? Y  Comment Dx Prostate Cancer. Wears breifs. Followed by urologist  Do you have problems with loss of bowel control? N  Managing your Medications? N  Managing your Finances? N  Housekeeping or managing your Housekeeping? N    Patient Care Team: Dorothyann Peng, NP as PCP - General (Family Medicine) Dorothyann Peng, NP as Nurse Practitioner (Family Medicine) [facility], [facility] any recent Medical Services you may have received from other than [facility] providers in the past year (date may be approximate).     Assessment:   This is a routine wellness examination for [place].  Hearing/Vision screen Hearing Screening - Comments:: No difficulty  hearing Vision Screening - Comments:: Wears reading glasses. Followed by Dr Delman Cheadle  Dietary issues and exercise activities discussed: Exercise limited by: None identified   Goals Addressed               This Visit's Progress     Increase physical activity (pt-stated)         Depression Screen    10/12/2021   11:36 AM 07/14/2020    8:35 AM 07/14/2020    8:34 AM 08/28/2018    9:37 AM 04/06/2018    8:35 AM  PHQ 2/9 Scores  PHQ - 2 Score 0 0 0 5 0  PHQ- 9 Score    19   Exception Documentation    Medical reason     Fall Risk    10/12/2021   11:40 AM 07/14/2020    8:37 AM 04/06/2018    8:35 AM  [place] in the past year? 0 0 0  Number falls in past yr: 0 0   Injury with Fall? 0 0   Risk for fall due to : No Fall Risks    Follow up  Falls evaluation completed     [place]:  Any stairs in or around the home? Yes  If so, are there any without handrails? No  Home free of loose throw rugs in walkways, pet beds, electrical cords, etc? Yes  Adequate lighting in your home to reduce risk of falls? Yes   ASSISTIVE DEVICES UTILIZED TO PREVENT FALLS:  Life alert? No  Use of a cane, walker or w/c? No  Grab bars in the bathroom? Yes  Shower chair or bench in shower? No Elevated toilet seat or a handicapped toilet? Yes  TIMED UP AND GO:  Was the test performed? No . Audio Visit  Cognitive Function:      Immunizations Immunization History  Administered Date(s)  Administered   Influenza Split 01/11/2012   Influenza,inj,Quad PF,6+ Mos 01/22/2014   Janssen (J&J) SARS-COV-2 Vaccination 09/05/2019   Td 04/12/1997, 11/15/2008   Zoster Recombinat (Shingrix) 05/09/2019, 07/12/2019    TDAP status: Due, Education has been provided regarding the importance of this vaccine. Advised may receive this vaccine at local pharmacy or Health Dept. Aware to provide a copy of the vaccination record if obtained from local pharmacy or Health Dept.  Verbalized acceptance and understanding.  Flu Vaccine status: Declined, Education has been provided regarding the importance of this vaccine but patient still declined. Advised may receive this vaccine at local pharmacy or Health Dept. Aware to provide a copy of the vaccination record if obtained from local pharmacy or Health Dept. Verbalized acceptance and understanding.    Covid-19 vaccine status: Completed vaccines  Qualifies for Shingles Vaccine? Yes   Zostavax completed Yes   Shingrix Completed?: Yes  Screening Tests Health Maintenance  Topic Date Due   COLONOSCOPY (Pts 45-63yr Insurance coverage will need to be confirmed)  10/13/2022 (Originally 10/28/2020)   TETANUS/TDAP  10/13/2022 (Originally 11/16/2018)   INFLUENZA VACCINE  11/10/2021   Hepatitis C Screening  Completed   HIV Screening  Completed   Zoster Vaccines- Shingrix  Completed   HPV VACCINES  Aged Out   COVID-19 Vaccine  Discontinued    Health Maintenance  There are no preventive care reminders to display for this patient.   Colorectal cancer screening: Referral to GI placed Patient deferred. Pt aware the office will call re: appt.  Lung Cancer Screening: (Low Dose CT Chest recommended if Age 585-80years, 30 pack-year currently smoking OR have quit w/in 15years.) does not qualify.     Additional Screening:  Hepatitis C Screening: does qualify; Completed 04/06/18  Vision Screening: Recommended annual ophthalmology exams for early detection of glaucoma and other disorders of the eye. Is the patient up to date with their annual eye exam?  Yes  Who is the provider or what is the name of the office in which the patient attends annual eye exams? Dr GDelman CheadleIf pt is not established with a provider, would they like to be referred to a provider to establish care? No .   Dental Screening: Recommended annual dental exams for proper oral hygiene  Community Resource Referral / Chronic Care Management:  CRR required  this visit?  No   CCM required this visit?  No      Plan:     I have personally reviewed and noted the following in the patient's chart:   Medical and social history Use of alcohol, tobacco or illicit drugs  Current medications and supplements including opioid prescriptions. Patient is not currently taking opioid prescriptions. Functional ability and status Nutritional status Physical activity Advanced directives List of other physicians Hospitalizations, surgeries, and ER visits in previous 12 months Vitals Screenings to include cognitive, depression, and falls Referrals and appointments  In addition, I have reviewed and discussed with patient certain preventive protocols, quality metrics, and best practice recommendations. A written personalized care plan for preventive services as well as general preventive health recommendations were provided to patient.     BCriselda Peaches LPN   [id number]  Nurse Notes: None

## 2021-10-24 ENCOUNTER — Other Ambulatory Visit: Payer: Self-pay | Admitting: Adult Health

## 2021-11-10 DIAGNOSIS — G4733 Obstructive sleep apnea (adult) (pediatric): Secondary | ICD-10-CM | POA: Diagnosis not present

## 2021-11-18 ENCOUNTER — Encounter (INDEPENDENT_AMBULATORY_CARE_PROVIDER_SITE_OTHER): Payer: Self-pay

## 2021-12-02 ENCOUNTER — Ambulatory Visit (INDEPENDENT_AMBULATORY_CARE_PROVIDER_SITE_OTHER): Payer: Medicare Other | Admitting: Adult Health

## 2021-12-02 ENCOUNTER — Encounter: Payer: Self-pay | Admitting: Adult Health

## 2021-12-02 VITALS — BP 117/88 | HR 50 | Temp 98.4°F | Ht 70.75 in | Wt 305.0 lb

## 2021-12-02 DIAGNOSIS — E7849 Other hyperlipidemia: Secondary | ICD-10-CM

## 2021-12-02 DIAGNOSIS — Z6839 Body mass index (BMI) 39.0-39.9, adult: Secondary | ICD-10-CM

## 2021-12-02 DIAGNOSIS — C61 Malignant neoplasm of prostate: Secondary | ICD-10-CM

## 2021-12-02 DIAGNOSIS — E559 Vitamin D deficiency, unspecified: Secondary | ICD-10-CM | POA: Diagnosis not present

## 2021-12-02 DIAGNOSIS — Z0000 Encounter for general adult medical examination without abnormal findings: Secondary | ICD-10-CM

## 2021-12-02 DIAGNOSIS — E876 Hypokalemia: Secondary | ICD-10-CM | POA: Diagnosis not present

## 2021-12-02 DIAGNOSIS — I10 Essential (primary) hypertension: Secondary | ICD-10-CM | POA: Diagnosis not present

## 2021-12-02 DIAGNOSIS — G4733 Obstructive sleep apnea (adult) (pediatric): Secondary | ICD-10-CM

## 2021-12-02 DIAGNOSIS — R7303 Prediabetes: Secondary | ICD-10-CM | POA: Diagnosis not present

## 2021-12-02 DIAGNOSIS — Z9989 Dependence on other enabling machines and devices: Secondary | ICD-10-CM

## 2021-12-02 LAB — COMPREHENSIVE METABOLIC PANEL
ALT: 15 U/L (ref 0–53)
AST: 18 U/L (ref 0–37)
Albumin: 4 g/dL (ref 3.5–5.2)
Alkaline Phosphatase: 54 U/L (ref 39–117)
BUN: 13 mg/dL (ref 6–23)
CO2: 35 mEq/L — ABNORMAL HIGH (ref 19–32)
Calcium: 9.9 mg/dL (ref 8.4–10.5)
Chloride: 93 mEq/L — ABNORMAL LOW (ref 96–112)
Creatinine, Ser: 0.91 mg/dL (ref 0.40–1.50)
GFR: 88.73 mL/min (ref >60.00)
Glucose, Bld: 95 mg/dL (ref 70–99)
Potassium: 3.4 mEq/L — ABNORMAL LOW (ref 3.5–5.1)
Sodium: 136 mEq/L (ref 135–145)
Total Bilirubin: 0.7 mg/dL (ref 0.2–1.2)
Total Protein: 7.9 g/dL (ref 6.0–8.3)

## 2021-12-02 LAB — TSH: TSH: 2.45 u[IU]/mL (ref 0.35–5.50)

## 2021-12-02 LAB — HEMOGLOBIN A1C: Hgb A1c MFr Bld: 6.3 % (ref 4.6–6.5)

## 2021-12-02 LAB — CBC WITH DIFFERENTIAL/PLATELET
Basophils Absolute: 0 10*3/uL (ref 0.0–0.1)
Basophils Relative: 0.5 % (ref 0.0–3.0)
Eosinophils Absolute: 0.1 10*3/uL (ref 0.0–0.7)
Eosinophils Relative: 1 % (ref 0.0–5.0)
HCT: 42.8 % (ref 39.0–52.0)
Hemoglobin: 13.9 g/dL (ref 13.0–17.0)
Lymphocytes Relative: 20.8 % (ref 12.0–46.0)
Lymphs Abs: 1.8 10*3/uL (ref 0.7–4.0)
MCHC: 32.5 g/dL (ref 30.0–36.0)
MCV: 82.9 fl (ref 78.0–100.0)
Monocytes Absolute: 0.9 10*3/uL (ref 0.1–1.0)
Monocytes Relative: 10.5 % (ref 3.0–12.0)
Neutro Abs: 5.9 10*3/uL (ref 1.4–7.7)
Neutrophils Relative %: 67.2 % (ref 43.0–77.0)
Platelets: 242 10*3/uL (ref 150.0–400.0)
RBC: 5.17 Mil/uL (ref 4.22–5.81)
RDW: 16.2 % — ABNORMAL HIGH (ref 11.5–15.5)
WBC: 8.7 10*3/uL (ref 4.0–10.5)

## 2021-12-02 LAB — LIPID PANEL
Cholesterol: 193 mg/dL (ref 0–200)
HDL: 45.1 mg/dL (ref >39.00)
LDL Cholesterol: 127 mg/dL — ABNORMAL HIGH (ref 0–99)
NonHDL: 147.81
Total CHOL/HDL Ratio: 4
Triglycerides: 103 mg/dL (ref 0.0–149.0)
VLDL: 20.6 mg/dL (ref 0.0–40.0)

## 2021-12-02 LAB — VITAMIN D 25 HYDROXY (VIT D DEFICIENCY, FRACTURES): VITD: 72.44 ng/mL (ref 30.00–100.00)

## 2021-12-02 MED ORDER — ATENOLOL-CHLORTHALIDONE 50-25 MG PO TABS: 1.0000 | ORAL_TABLET | Freq: Every day | ORAL | 3 refills | Status: DC

## 2021-12-02 MED ORDER — POTASSIUM CHLORIDE ER 10 MEQ PO TBCR: 10.0000 meq | EXTENDED_RELEASE_TABLET | Freq: Every day | ORAL | 3 refills | Status: DC

## 2021-12-02 NOTE — Patient Instructions (Signed)
It was great seeing you today   We will follow up with you regarding your lab work   Please let me know if you need anything   

## 2021-12-02 NOTE — Progress Notes (Signed)
Subjective:    Patient ID: Jonathan [name], male    DOB: September 20, 1955, 66 y.o.   MRN: [id number]  HPI Patient presents for yearly preventative medicine examination. He is a pleasant 66 year old male who  has a past medical history of Anxiety, Asthma, BPH (benign prostatic hyperplasia), ED (erectile dysfunction), Elevated PSA, Esophageal reflux, Hearing loss, History of hip replacement, History of knee replacement, Hypertension, Lactose intolerance, Numbness in both legs, OSA on CPAP (2019), Pre-diabetes, Prostate cancer [facility]) (Jan 2016), and Vitamin D deficiency.  Essential hypertension-managed with atenolol/chlorthalidone 50-25 mg daily as well as K-Dur 10 meq due to history of hypokalemia with diuretic usage.  He denies dizziness, lightheadedness, chest pain, or shortness of breath. He does check his blood pressure at home and reports readings in the 117/88 range.  BP Readings from Last 3 Encounters:  12/02/21 117/88  09/14/21 120/68  02/26/21 134/73   Hyperlipidemia- not currently on medication  Lab Results  Component Value Date   CHOL 183 02/26/2021   HDL 50 02/26/2021   LDLCALC 121 (H) 02/26/2021   TRIG 66 02/26/2021   CHOLHDL 4 05/09/2019   Pre diabetes - not on medications. Diet controlled.  Lab Results  Component Value Date   HGBA1C 5.9 (H) 02/26/2021   OSA-uses CPAP nightly with excellent compliance.  Does not feel fatigued during the day or the need for an afternoon nap.  History of prostate cancer-is followed by urology at [facility].  Prior treatment includes robotic prostatectomy in 2016.  He does note having frequency and erectile dysfunction but denies incontinence. He is currently seeing a urologist in [place]. He would like to be referred to Dr. Diamantina [facility] in [facility]   Obesity - He was going to [facility] and was able to lose about 40 pounds but stopped going as it was not sustainable for him. He has put the weight back on over the last year. He does  report that he is eating healthy and exercising upper body but he has pain in his hips.   Wt Readings from Last 3 Encounters:  12/02/21 (!) 305 lb (138.3 kg)  10/12/21 276 lb (125.2 kg)  09/14/21 299 lb (135.6 kg)   Vitamin D Deficiency - not currently prescribed vitamin D  All immunizations and health maintenance protocols were reviewed with the patient and needed orders were placed.  Appropriate screening laboratory values were ordered for the patient including screening of hyperlipidemia, renal function and hepatic function. If indicated by BPH, a PSA was ordered.  Medication reconciliation,  past medical history, social history, problem list and allergies were reviewed in detail with the patient  Goals were established with regard to weight loss, exercise, and  diet in compliance with medications  He is up to date on routine colon cancer screening    Review of Systems  Constitutional: Negative.   HENT: Negative.    Eyes: Negative.   Respiratory: Negative.    Cardiovascular: Negative.   Gastrointestinal: Negative.   Endocrine: Negative.   Genitourinary: Negative.   Musculoskeletal:  Positive for arthralgias and gait problem.  Skin: Negative.   Allergic/Immunologic: Negative.   Hematological: Negative.   Psychiatric/Behavioral: Negative.    All other systems reviewed and are negative.  Past Medical History:  Diagnosis Date   Anxiety    Asthma    BPH (benign prostatic hyperplasia)    ED (erectile dysfunction)    Elevated PSA    9.33   Esophageal reflux    Hearing loss  History of hip replacement    History of knee replacement    Hypertension    Lactose intolerance    Numbness in both legs    OSA on CPAP 2019   Pre-diabetes    Prostate cancer [facility]) Jan 2016   prostatectomy   Vitamin D deficiency     Social History   Socioeconomic History   Marital status: Married    Spouse name: Not on file   Number of children: 2   Years of education: Not on file    Highest education level: Not on file  Occupational History   Occupation: Education administrator: [employer]  Tobacco Use   Smoking status: Never    Passive exposure: Never   Smokeless tobacco: Never  Vaping Use   Vaping Use: Never used  Substance and Sexual Activity   Alcohol use: No   Drug use: No   Sexual activity: Yes  Other Topics Concern   Not on file  Social History Narrative   Not on file   Social Determinants of Health   Financial Resource Strain: Low Risk  (10/12/2021)   Overall Financial Resource Strain (CARDIA)    Difficulty of Paying Living Expenses: Not hard at all  Food Insecurity: No Food Insecurity (10/12/2021)   Hunger Vital Sign    Worried About Running Out of Food in the Last Year: Never true    [place] in the Last Year: Never true  Transportation Needs: No Transportation Needs (10/12/2021)   PRAPARE - Hydrologist (Medical): No    Lack of Transportation (Non-Medical): No  Physical Activity: Unknown (10/12/2021)   Exercise Vital Sign    Days of Exercise per Week: Patient refused    Minutes of Exercise per Session: Patient refused  Stress: No Stress Concern Present (10/12/2021)   [place]    Feeling of Stress : Not at all  Social Connections: [place] (10/12/2021)   Social Connection and Isolation Panel [NHANES]    Frequency of Communication with Friends and Family: More than three times a week    Frequency of Social Gatherings with Friends and Family: More than three times a week    Attends Religious Services: More than 4 times per year    Active Member of [employer] or Organizations: Yes    Attends Archivist Meetings: More than 4 times per year    Marital Status: Married  Human resources officer Violence: Not At Risk (10/12/2021)   Humiliation, Afraid, Rape, and Kick questionnaire    Fear of Current or Ex-Partner: No    Emotionally Abused: No    Physically  Abused: No    Sexually Abused: No    Past Surgical History:  Procedure Laterality Date   CORONARY CALCIUM SCORE / CARDIAC CT ANGIOGRAM  11/16/2019   Cor Ca++ Score ZERO. NO evidence of CAD. Dominant RCA-<PDA & PLVB with minimal (<25%) calcified plaque. LM no plaque -< LAD & LCx. LAD w/ large D1 - no plaque. Non-dom LCx with 1 Large OM1 - no plaque. Normal PV drainage. Normal LAA & PA size.    FOOT SURGERY     bilateral foot   NM MYOVIEW LTD  07/2007   Normal nuclear stress test.  Mild fixed defect in the anterior wall likely related to soft tissue attenuation.  Normal EF.  Fair exercise capacity.   PROSTATE BIOPSY  06/13/2012   right lat mid gleason 3+3=6  PROSTATECTOMY  2016   TOTAL HIP ARTHROPLASTY  2008 &2007   Right and left   TRANSTHORACIC ECHOCARDIOGRAM  09/2017   EF 55-60%. ??  Normal diastolic function.  No R WMA.  Moderate pulmonary hypertension-PA P estimated 59 mmHg.   TRANSTHORACIC ECHOCARDIOGRAM  11/13/2019    EF 55-60%. NO RWMA. Mild LV dilation & Gr 1 DD. MIld LA dilation. Normal valves. Normal CVP.    Family History  Problem Relation Age of Onset   Hypertension Mother    Colon cancer Mother    Obesity Mother    Prostate cancer Father 61       alive now age 50   Asthma Father    Heart disease Father        Pt is not sure of details (no MI)   Alcoholism Father    Obesity Father    Tuberculosis Brother    Lung cancer Maternal Grandmother        non-smoker    Allergies  Allergen Reactions   Codeine     REACTION: emesis   Cozaar [Losartan Potassium] Hives   Norvasc [Amlodipine] Swelling    Lower extremity edema     Current Outpatient Medications on File Prior to Visit  Medication Sig Dispense Refill   Vitamin D, Ergocalciferol, (DRISDOL) 1.25 MG (50000 UNIT) CAPS capsule Take 1 capsule every 14 days 2 capsule 0   No current facility-administered medications on file prior to visit.    BP 117/88 Comment: home  Pulse (!) 50   Temp 98.4 F (36.9 C)  (Oral)   Ht 5' 10.75" (1.797 m)   Wt (!) 305 lb (138.3 kg)   SpO2 97%   BMI 42.84 kg/m       Objective:   Physical Exam Vitals and nursing note reviewed.  Constitutional:      General: He is not in acute distress.    Appearance: Normal appearance. He is well-developed. He is obese.  HENT:     Head: Normocephalic and atraumatic.     Right Ear: Tympanic membrane, ear canal and external ear normal. There is no impacted cerumen.     Left Ear: Tympanic membrane, ear canal and external ear normal. There is no impacted cerumen.     Nose: Nose normal. No congestion or rhinorrhea.     Mouth/Throat:     Mouth: Mucous membranes are moist.     Pharynx: Oropharynx is clear. No oropharyngeal exudate or posterior oropharyngeal erythema.  Eyes:     General:        Right eye: No discharge.        Left eye: No discharge.     Extraocular Movements: Extraocular movements intact.     Conjunctiva/sclera: Conjunctivae normal.     Pupils: Pupils are equal, round, and reactive to light.  Neck:     Vascular: No carotid bruit.     Trachea: No tracheal deviation.  Cardiovascular:     Rate and Rhythm: Normal rate and regular rhythm.     Pulses: Normal pulses.     Heart sounds: Normal heart sounds. No murmur heard.    No friction rub. No gallop.  Pulmonary:     Effort: Pulmonary effort is normal. No respiratory distress.     Breath sounds: Normal breath sounds. No stridor. No wheezing, rhonchi or rales.  Chest:     Chest wall: No tenderness.  Abdominal:     General: Bowel sounds are normal. There is no distension.     Palpations:  Abdomen is soft. There is no mass.     Tenderness: There is no abdominal tenderness. There is no right CVA tenderness, left CVA tenderness, guarding or rebound.     Hernia: No hernia is present.  Musculoskeletal:        General: No swelling, tenderness, deformity or signs of injury. Normal range of motion.     Right lower leg: No edema.     Left lower leg: No edema.   Lymphadenopathy:     Cervical: No cervical adenopathy.  Skin:    General: Skin is warm and dry.     Capillary Refill: Capillary refill takes less than 2 seconds.     Coloration: Skin is not jaundiced or pale.     Findings: No bruising, erythema, lesion or rash.  Neurological:     General: No focal deficit present.     Mental Status: He is alert and oriented to person, place, and time.     Cranial Nerves: No cranial nerve deficit.     Sensory: No sensory deficit.     Motor: No weakness.     Coordination: Coordination normal.     Gait: Gait normal.     Deep Tendon Reflexes: Reflexes normal.  Psychiatric:        Mood and Affect: Mood normal.        Behavior: Behavior normal.        Thought Content: Thought content normal.        Judgment: Judgment normal.       Assessment & Plan:  1. Routine general medical examination at a health care facility - Follow up in one year or sooner if needed - Advised on vaccinations and he will get them at the pharmacy  - CBC with Differential/Platelet; Future - Comprehensive metabolic panel; Future - Hemoglobin A1c; Future - Lipid panel; Future - TSH; Future  2. Other hyperlipidemia - Consider statin  - CBC with Differential/Platelet; Future - Comprehensive metabolic panel; Future - Hemoglobin A1c; Future - Lipid panel; Future - TSH; Future  3. Prediabetes  - CBC with Differential/Platelet; Future - Comprehensive metabolic panel; Future - Hemoglobin A1c; Future - Lipid panel; Future - TSH; Future  4. Essential hypertension - Controlled. No change  - CBC with Differential/Platelet; Future - Comprehensive metabolic panel; Future - Hemoglobin A1c; Future - Lipid panel; Future - TSH; Future - potassium chloride (KLOR-CON) 10 MEQ tablet; Take 1 tablet (10 mEq total) by mouth daily.  Dispense: 90 tablet; Refill: 3 - atenolol-chlorthalidone (TENORETIC) 50-25 MG tablet; Take 1 tablet by mouth daily.  Dispense: 90 tablet; Refill: 3  5.  Class 2 severe obesity due to excess calories with serious comorbidity and body mass index (BMI) of 39.0 to 39.9 in adult [facility]) - Consider Wegovy  - Needs more aoerobic exercise - Continue to eat healthy  - CBC with Differential/Platelet; Future - Comprehensive metabolic panel; Future - Hemoglobin A1c; Future - Lipid panel; Future - TSH; Future  6. OSA on CPAP - Continue CPAP - CBC with Differential/Platelet; Future - Comprehensive metabolic panel; Future - Hemoglobin A1c; Future - Lipid panel; Future - TSH; Future  7. Prostate CA [facility])  - Ambulatory referral to Urology  8. Vitamin D deficiency  - VITAMIN D 25 Hydroxy (Vit-D Deficiency, Fractures); Future  9. Hypokalemia  - potassium chloride (KLOR-CON) 10 MEQ tablet; Take 1 tablet (10 mEq total) by mouth daily.  Dispense: 90 tablet; Refill: 3  Dorothyann Peng, NP

## 2021-12-03 ENCOUNTER — Other Ambulatory Visit: Payer: Self-pay | Admitting: Adult Health

## 2021-12-10 ENCOUNTER — Ambulatory Visit: Payer: Self-pay | Admitting: Urology

## 2021-12-16 ENCOUNTER — Encounter: Payer: Self-pay | Admitting: Adult Health

## 2021-12-16 ENCOUNTER — Ambulatory Visit (INDEPENDENT_AMBULATORY_CARE_PROVIDER_SITE_OTHER): Payer: Medicare Other | Admitting: Adult Health

## 2021-12-16 VITALS — BP 130/88 | HR 62 | Temp 97.9°F | Wt 305.0 lb

## 2021-12-16 DIAGNOSIS — R7303 Prediabetes: Secondary | ICD-10-CM | POA: Diagnosis not present

## 2021-12-16 DIAGNOSIS — Z6839 Body mass index (BMI) 39.0-39.9, adult: Secondary | ICD-10-CM

## 2021-12-16 MED ORDER — SEMAGLUTIDE(0.25 OR 0.5MG/DOS) 2 MG/3ML ~~LOC~~ SOPN: 0.2500 mg | PEN_INJECTOR | SUBCUTANEOUS | 0 refills | Status: DC

## 2021-12-16 NOTE — Progress Notes (Signed)
Subjective:    Patient ID: Jonathan [name], male    DOB: 02/07/1956, 66 y.o.   MRN: [id number]  HPI 66 year old male who  has a past medical history of Anxiety, Asthma, BPH (benign prostatic hyperplasia), ED (erectile dysfunction), Elevated PSA, Esophageal reflux, Hearing loss, History of hip replacement, History of knee replacement, Hypertension, Lactose intolerance, Numbness in both legs, OSA on CPAP (2019), Pre-diabetes, Prostate cancer [facility]) (Jan 2016), and Vitamin D deficiency.  He presents to the office today for follow-up regarding lab work.  After his physical but over a week and a half ago we tried calling him with his lab work but was unable to get in touch with him.  His labs were pretty unremarkable except for an elevation in his A1c to 6.3.  He was advised that he is prediabetic.  He is interested in trialing Ozempic to help with weight loss and also bring down his A1c.   Review of Systems See HPI   Past Medical History:  Diagnosis Date   Anxiety    Asthma    BPH (benign prostatic hyperplasia)    ED (erectile dysfunction)    Elevated PSA    9.33   Esophageal reflux    Hearing loss    History of hip replacement    History of knee replacement    Hypertension    Lactose intolerance    Numbness in both legs    OSA on CPAP 2019   Pre-diabetes    Prostate cancer [facility]) Jan 2016   prostatectomy   Vitamin D deficiency     Social History   Socioeconomic History   Marital status: Married    Spouse name: Not on file   Number of children: 2   Years of education: Not on file   Highest education level: Not on file  Occupational History   Occupation: Education administrator: [employer]  Tobacco Use   Smoking status: Never    Passive exposure: Never   Smokeless tobacco: Never  Vaping Use   Vaping Use: Never used  Substance and Sexual Activity   Alcohol use: No   Drug use: No   Sexual activity: Yes  Other Topics Concern   Not on file  Social History Narrative   Not on file    Social Determinants of Health   Financial Resource Strain: Low Risk  (10/12/2021)   Overall Financial Resource Strain (CARDIA)    Difficulty of Paying Living Expenses: Not hard at all  Food Insecurity: No Food Insecurity (10/12/2021)   Hunger Vital Sign    Worried About Running Out of Food in the Last Year: Never true    [place] in the Last Year: Never true  Transportation Needs: No Transportation Needs (10/12/2021)   PRAPARE - Hydrologist (Medical): No    Lack of Transportation (Non-Medical): No  Physical Activity: Unknown (10/12/2021)   Exercise Vital Sign    Days of Exercise per Week: Patient refused    Minutes of Exercise per Session: Patient refused  Stress: No Stress Concern Present (10/12/2021)   [place]    Feeling of Stress : Not at all  Social Connections: [place] (10/12/2021)   Social Connection and Isolation Panel [NHANES]    Frequency of Communication with Friends and Family: More than three times a week    Frequency of Social Gatherings with Friends and Family: More than three times  a week    Attends Religious Services: More than 4 times per year    Active Member of Clubs or Organizations: Yes    Attends Archivist Meetings: More than 4 times per year    Marital Status: Married  Human resources officer Violence: Not At Risk (10/12/2021)   Humiliation, Afraid, Rape, and Kick questionnaire    Fear of Current or Ex-Partner: No    Emotionally Abused: No    Physically Abused: No    Sexually Abused: No    Past Surgical History:  Procedure Laterality Date   CORONARY CALCIUM SCORE / CARDIAC CT ANGIOGRAM  11/16/2019   Cor Ca++ Score ZERO. NO evidence of CAD. Dominant RCA-<PDA & PLVB with minimal (<25%) calcified plaque. LM no plaque -< LAD & LCx. LAD w/ large D1 - no plaque. Non-dom LCx with 1 Large OM1 - no plaque. Normal PV drainage. Normal LAA & PA size.     FOOT SURGERY     bilateral foot   NM MYOVIEW LTD  07/2007   Normal nuclear stress test.  Mild fixed defect in the anterior wall likely related to soft tissue attenuation.  Normal EF.  Fair exercise capacity.   PROSTATE BIOPSY  06/13/2012   right lat mid gleason 3+3=6   PROSTATECTOMY  2016   TOTAL HIP ARTHROPLASTY  2008 &2007   Right and left   TRANSTHORACIC ECHOCARDIOGRAM  09/2017   EF 55-60%. ??  Normal diastolic function.  No R WMA.  Moderate pulmonary hypertension-PA P estimated 59 mmHg.   TRANSTHORACIC ECHOCARDIOGRAM  11/13/2019    EF 55-60%. NO RWMA. Mild LV dilation & Gr 1 DD. MIld LA dilation. Normal valves. Normal CVP.    Family History  Problem Relation Age of Onset   Hypertension Mother    Colon cancer Mother    Obesity Mother    Prostate cancer Father 62       alive now age 50   Asthma Father    Heart disease Father        Pt is not sure of details (no MI)   Alcoholism Father    Obesity Father    Tuberculosis Brother    Lung cancer Maternal Grandmother        non-smoker    Allergies  Allergen Reactions   Codeine     REACTION: emesis   Cozaar [Losartan Potassium] Hives   Norvasc [Amlodipine] Swelling    Lower extremity edema     Current Outpatient Medications on File Prior to Visit  Medication Sig Dispense Refill   atenolol-chlorthalidone (TENORETIC) 50-25 MG tablet Take 1 tablet by mouth daily. 90 tablet 3   potassium chloride (KLOR-CON) 10 MEQ tablet Take 1 tablet (10 mEq total) by mouth daily. (Patient not taking: Reported on 12/16/2021) 90 tablet 3   Vitamin D, Ergocalciferol, (DRISDOL) 1.25 MG (50000 UNIT) CAPS capsule Take 1 capsule every 14 days (Patient not taking: Reported on 12/16/2021) 2 capsule 0   No current facility-administered medications on file prior to visit.    BP (!) 130/90 (BP Location: Left Arm, Patient Position: Sitting, Cuff Size: Large)   Pulse 62   Temp 97.9 F (36.6 C) (Oral)   Wt (!) 305 lb (138.3 kg)   SpO2 98%   BMI 42.84  kg/m       Objective:   Physical Exam Vitals and nursing note reviewed.  Constitutional:      Appearance: Normal appearance. He is obese.  Cardiovascular:     Rate and Rhythm:  Normal rate and regular rhythm.     Pulses: Normal pulses.     Heart sounds: Normal heart sounds.  Pulmonary:     Effort: Pulmonary effort is normal.     Breath sounds: Normal breath sounds.  Skin:    General: Skin is warm and dry.  Neurological:     General: No focal deficit present.     Mental Status: He is alert and oriented to person, place, and time.  Psychiatric:        Mood and Affect: Mood normal.        Behavior: Behavior normal.        Thought Content: Thought content normal.        Judgment: Judgment normal.       Assessment & Plan:  1. Prediabetes  - Semaglutide,0.25 or 0.'5MG'$ /DOS, 2 MG/3ML SOPN; Inject 0.25 mg into the skin once a week.  Dispense: 3 mL; Refill: 0 - Advised follow up in one month after starting ozempic  - Reviewed side effects in detail with patient.  2. Class 2 severe obesity due to excess calories with serious comorbidity and body mass index (BMI) of 39.0 to 39.9 in adult [facility])  - Semaglutide,0.25 or 0.'5MG'$ /DOS, 2 MG/3ML SOPN; Inject 0.25 mg into the skin once a week.  Dispense: 3 mL; Refill: 0  Time spent with patient today was 30 minutes which consisted of chart review, discussing recent labs, weight loss management and pre diabetes treatment answering questions and documentation.  Dorothyann Peng, NP

## 2022-01-07 ENCOUNTER — Encounter: Payer: Self-pay | Admitting: Urology

## 2022-01-07 ENCOUNTER — Other Ambulatory Visit: Payer: Self-pay

## 2022-01-07 ENCOUNTER — Ambulatory Visit: Payer: Medicare Other | Admitting: Urology

## 2022-01-07 VITALS — BP 144/80 | HR 54 | Ht 71.0 in | Wt 308.5 lb

## 2022-01-07 DIAGNOSIS — Z8546 Personal history of malignant neoplasm of prostate: Secondary | ICD-10-CM | POA: Diagnosis not present

## 2022-01-07 DIAGNOSIS — R3915 Urgency of urination: Secondary | ICD-10-CM

## 2022-01-07 DIAGNOSIS — R350 Frequency of micturition: Secondary | ICD-10-CM

## 2022-01-07 DIAGNOSIS — R399 Unspecified symptoms and signs involving the genitourinary system: Secondary | ICD-10-CM

## 2022-01-07 DIAGNOSIS — N529 Male erectile dysfunction, unspecified: Secondary | ICD-10-CM | POA: Diagnosis not present

## 2022-01-07 DIAGNOSIS — C61 Malignant neoplasm of prostate: Secondary | ICD-10-CM

## 2022-01-07 MED ORDER — TADALAFIL 10 MG PO TABS: 10.0000 mg | ORAL_TABLET | Freq: Every day | ORAL | 11 refills | Status: DC

## 2022-01-07 MED ORDER — MIRABEGRON ER 50 MG PO TB24: 50.0000 mg | ORAL_TABLET | Freq: Every day | ORAL | 0 refills | Status: DC

## 2022-01-07 NOTE — Patient Instructions (Signed)
Erectile Dysfunction ?Erectile dysfunction (ED) is the inability to get or keep an erection in order to have sexual intercourse. ED is considered a symptom of an underlying disorder and is not considered a disease. ED may include: ?Inability to get an erection. ?Lack of enough hardness of the erection to allow penetration. ?Loss of erection before sex is finished. ?What are the causes? ?This condition may be caused by: ?Physical causes, such as: ?Artery problems. This may include heart disease, high blood pressure, atherosclerosis, and diabetes. ?Hormonal problems, such as low testosterone. ?Obesity. ?Nerve problems. This may include back or pelvic injuries, multiple sclerosis, Parkinson's disease, spinal cord injury, and stroke. ?Certain medicines, such as: ?Pain relievers. ?Antidepressants. ?Blood pressure medicines and water pills (diuretics). ?Cancer medicines. ?Antihistamines. ?Muscle relaxants. ?Lifestyle factors, such as: ?Use of drugs such as marijuana, cocaine, or opioids. ?Excessive use of alcohol. ?Smoking. ?Lack of physical activity or exercise. ?Psychological causes, such as: ?Anxiety or stress. ?Sadness or depression. ?Exhaustion. ?Fear about sexual performance. ?Guilt. ?What are the signs or symptoms? ?Symptoms of this condition include: ?Inability to get an erection. ?Lack of enough hardness of the erection to allow penetration. ?Loss of the erection before sex is finished. ?Sometimes having normal erections, but with frequent unsatisfactory episodes. ?Low sexual satisfaction in either partner due to erection problems. ?A curved penis occurring with erection. The curve may cause pain, or the penis may be too curved to allow for intercourse. ?Never having nighttime or morning erections. ?How is this diagnosed? ?This condition is often diagnosed by: ?Performing a physical exam to find other diseases or specific problems with the penis. ?Asking you detailed questions about the problem. ?Doing tests,  such as: ?Blood tests to check for diabetes mellitus or high cholesterol, or to measure hormone levels. ?Other tests to check for underlying health conditions. ?An ultrasound exam to check for scarring. ?A test to check blood flow to the penis. ?Doing a sleep study at home to measure nighttime erections. ?How is this treated? ?This condition may be treated by: ?Medicines, such as: ?Medicine taken by mouth to help you achieve an erection (oral medicine). ?Hormone replacement therapy to replace low testosterone levels. ?Medicine that is injected into the penis. Your health care provider may instruct you how to give yourself these injections at home. ?Medicine that is delivered with a short applicator tube. The tube is inserted into the opening at the tip of the penis, which is the opening of the urethra. A tiny pellet of medicine is put in the urethra. The pellet dissolves and enhances erectile function. This is also called MUSE (medicated urethral system for erections) therapy. ?Vacuum pump. This is a pump with a ring on it. The pump and ring are placed on the penis and used to create pressure that helps the penis become erect. ?Penile implant surgery. In this procedure, you may receive: ?An inflatable implant. This consists of cylinders, a pump, and a reservoir. The cylinders can be inflated with a fluid that helps to create an erection, and they can be deflated after intercourse. ?A semi-rigid implant. This consists of two silicone rubber rods. The rods provide some rigidity. They are also flexible, so the penis can both curve downward in its normal position and become straight for sexual intercourse. ?Blood vessel surgery to improve blood flow to the penis. During this procedure, a blood vessel from a different part of the body is placed into the penis to allow blood to flow around (bypass) damaged or blocked blood vessels. ?Lifestyle changes,   such as exercising more, losing weight, and quitting smoking. ?Follow  these instructions at home: ?Medicines ? ?Take over-the-counter and prescription medicines only as told by your health care provider. Do not increase the dosage without first discussing it with your health care provider. ?If you are using self-injections, do injections as directed by your health care provider. Make sure you avoid any veins that are on the surface of the penis. After giving an injection, apply pressure to the injection site for 5 minutes. ?Talk to your health care provider about how to prevent headaches while taking ED medicines. These medicines may cause a sudden headache due to the increase in blood flow in your body. ?General instructions ?Exercise regularly, as directed by your health care provider. Work with your health care provider to lose weight, if needed. ?Do not use any products that contain nicotine or tobacco. These products include cigarettes, chewing tobacco, and vaping devices, such as e-cigarettes. If you need help quitting, ask your health care provider. ?Before using a vacuum pump, read the instructions that come with the pump and discuss any questions with your health care provider. ?Keep all follow-up visits. This is important. ?Contact a health care provider if: ?You feel nauseous. ?You are vomiting. ?You get sudden headaches while taking ED medicines. ?You have any concerns about your sexual health. ?Get help right away if: ?You are taking oral or injectable medicines and you have an erection that lasts longer than 4 hours. If your health care provider is unavailable, go to the nearest emergency room for evaluation. An erection that lasts much longer than 4 hours can result in permanent damage to your penis. ?You have severe pain in your groin or abdomen. ?You develop redness or severe swelling of your penis. ?You have redness spreading at your groin or lower abdomen. ?You are unable to urinate. ?You experience chest pain or a rapid heartbeat (palpitations) after taking oral  medicines. ?These symptoms may represent a serious problem that is an emergency. Do not wait to see if the symptoms will go away. Get medical help right away. Call your local emergency services (911 in the [place] Do not drive yourself to the hospital. ?Summary ?Erectile dysfunction (ED) is the inability to get or keep an erection during sexual intercourse. ?This condition is diagnosed based on a physical exam, your symptoms, and tests to determine the cause. Treatment varies depending on the cause and may include medicines, hormone therapy, surgery, or a vacuum pump. ?You may need follow-up visits to make sure that you are using your medicines or devices correctly. ?Get help right away if you are taking or injecting medicines and you have an erection that lasts longer than 4 hours. ?This information is not intended to replace advice given to you by your health care provider. Make sure you discuss any questions you have with your health care provider. ?Document Revised: 06/25/2020 Document Reviewed: 06/25/2020 ?Elsevier Patient Education ? 2023 Elsevier Inc[employer] ? ?

## 2022-01-07 NOTE — Progress Notes (Signed)
01/07/22 9:20 AM   Jonathan [name] [birth year] [id number]  CC: Prostate cancer, ED, urinary frequency  HPI: 66 year old male transferring his urologic care from [place].  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  PSA has remained undetectable since that time, most recently in August 2022.  His past medical history is notable for morbid obesity with BMI of 43, diabetes, hypertension.  His 2 primary complaints today are urinary frequency and urgency during the day, nocturia 3 times at night, and ED since his original prostate surgery.  It sounds that he tried sildenafil once but had severe headache from this medication, but otherwise has not tried any medications for ED.  He drinks only water during the day.  He denies any stress incontinence.  He is compliant with CPAP for sleep apnea.   PMH: Past Medical History:  Diagnosis Date   Anxiety    Asthma    BPH (benign prostatic hyperplasia)    ED (erectile dysfunction)    Elevated PSA    9.33   Esophageal reflux    Hearing loss    History of hip replacement    History of knee replacement    Hypertension    Lactose intolerance    Numbness in both legs    OSA on CPAP 2019   Pre-diabetes    Prostate cancer [facility]) Jan 2016   prostatectomy   Vitamin D deficiency     Surgical History: Past Surgical History:  Procedure Laterality Date   CORONARY CALCIUM SCORE / CARDIAC CT ANGIOGRAM  11/16/2019   Cor Ca++ Score ZERO. NO evidence of CAD. Dominant RCA-<PDA & PLVB with minimal (<25%) calcified plaque. LM no plaque -< LAD & LCx. LAD w/ large D1 - no plaque. Non-dom LCx with 1 Large OM1 - no plaque. Normal PV drainage. Normal LAA & PA size.    FOOT SURGERY     bilateral foot   NM MYOVIEW LTD  07/2007   Normal nuclear stress test.  Mild fixed defect in the anterior wall likely related to soft tissue  attenuation.  Normal EF.  Fair exercise capacity.   PROSTATE BIOPSY  06/13/2012   right lat mid gleason 3+3=6   PROSTATECTOMY  2016   TOTAL HIP ARTHROPLASTY  2008 &2007   Right and left   TRANSTHORACIC ECHOCARDIOGRAM  09/2017   EF 55-60%. ??  Normal diastolic function.  No R WMA.  Moderate pulmonary hypertension-PA P estimated 59 mmHg.   TRANSTHORACIC ECHOCARDIOGRAM  11/13/2019    EF 55-60%. NO RWMA. Mild LV dilation & Gr 1 DD. MIld LA dilation. Normal valves. Normal CVP.      Family History: Family History  Problem Relation Age of Onset   Hypertension Mother    Colon cancer Mother    Obesity Mother    Prostate cancer Father 68       alive now age 74   Asthma Father    Heart disease Father        Pt is not sure of details (no MI)   Alcoholism Father    Obesity Father    Tuberculosis Brother    Lung cancer Maternal Grandmother        non-smoker    Social History:  reports that he has never smoked. He has never been exposed to tobacco smoke. He has never used smokeless tobacco. He reports that he does not drink alcohol and  does not use drugs.  Physical Exam: BP (!) 144/80   Pulse (!) 54   Ht '5\' 11"'$  (1.803 m)   Wt (!) 308 lb 8 oz (139.9 kg)   BMI 43.03 kg/m    Constitutional:  Alert and oriented, No acute distress. Cardiovascular: No clubbing, cyanosis, or edema. Respiratory: Normal respiratory effort, no increased work of breathing. GI: Abdomen is soft, nontender, nondistended, no abdominal masses  Labs: PSA 12/09/2020 <0.1 Dipstick urinalysis benign  Imaging I personally viewed and interpreted the CT abdomen and pelvis with contrast from April 2021 showing no hydronephrosis, bladder evaluation limited by artifact from hip prostheses.  Unable to review images from CT from February 2022, but report also benign with no urologic abnormalities.  Assessment & Plan:   66 year old male with history of intermediate risk prostate cancer(3+4=7, negative margins) treated with  radical prostatectomy at [facility] in 2016, and PSA has been undetectable since that time.  Primary complaints are ED since surgery and urinary frequency/urgency and nocturia.  I reviewed the outside notes from [place] including the operative note extensively.  Regarding ongoing PSA monitoring, I recommended continuing yearly PSA monitoring for a total of 10 years after surgery.  He is very concerned about potential recurrence and is adamant about checking a PSA every 6 months.  In terms of his ED, I think it is worth a trial of Cialis 10 to 20 mg daily, we also reviewed other options like vacuum erection device or penile injections.  Regarding the urinary frequency/urgency, weight loss and behavioral strategies would certainly improve his symptoms, but he was interested in a trial of medications.  We discussed the difference between anticholinergics and the beta 3 agonist medications, and he already has problems with dry mouth and constipation and is interested in a trial of Myrbetriq.  Risk and benefits were discussed.  -Trial of Cialis 10 to 20 mg daily for ED -Trial of Myrbetriq 50 mg daily, samples given.  Okay to fill prescription if helpful -PSA today, call with results -Per patient preference we will repeat PSA lab in 6 months and call with those results, continue yearly follow-up.  Okay to make appointment with PA to discuss intracavernosal injections for ED if patient desires   Nickolas Madrid, MD 01/07/2022  [facility] [address] [place],  [address] [phone]

## 2022-01-08 LAB — PSA: Prostate Specific Ag, Serum: <0.1 ng/mL (ref 0.0–4.0)

## 2022-01-27 ENCOUNTER — Ambulatory Visit: Payer: Medicare Other | Admitting: Pulmonary Disease

## 2022-02-04 ENCOUNTER — Other Ambulatory Visit: Payer: Self-pay | Admitting: Adult Health

## 2022-02-04 DIAGNOSIS — R7303 Prediabetes: Secondary | ICD-10-CM

## 2022-02-04 NOTE — Telephone Encounter (Signed)
Pt notified that he will need to schedule a 1 month f/u. Pt has been scheduled pt took last injections Tues. But stated it will be fine if he is off a couple of days. No further actions needed.

## 2022-02-08 ENCOUNTER — Telehealth: Payer: Self-pay

## 2022-02-08 NOTE — Telephone Encounter (Signed)
Pt called  to let you know that he has tried the  10 mg of Cialis and was told to call back if this didn't work. Pt wants to know if you could change the dosage or go up to 20 mg , pt is taking 1 tablet one a day. Please advise

## 2022-02-09 MED ORDER — TADALAFIL 20 MG PO TABS: 20.0000 mg | ORAL_TABLET | Freq: Every day | ORAL | 0 refills | Status: DC | PRN

## 2022-02-09 NOTE — Telephone Encounter (Signed)
Spoke with patient and advised results rx sent to pharmacy by e-script  

## 2022-02-11 ENCOUNTER — Ambulatory Visit (INDEPENDENT_AMBULATORY_CARE_PROVIDER_SITE_OTHER): Payer: Medicare Other | Admitting: Adult Health

## 2022-02-11 ENCOUNTER — Encounter: Payer: Self-pay | Admitting: Adult Health

## 2022-02-11 VITALS — BP 130/88 | HR 50 | Temp 98.4°F | Ht 71.0 in | Wt 301.0 lb

## 2022-02-11 DIAGNOSIS — G4733 Obstructive sleep apnea (adult) (pediatric): Secondary | ICD-10-CM | POA: Diagnosis not present

## 2022-02-11 DIAGNOSIS — Z6839 Body mass index (BMI) 39.0-39.9, adult: Secondary | ICD-10-CM

## 2022-02-11 DIAGNOSIS — R7303 Prediabetes: Secondary | ICD-10-CM

## 2022-02-11 MED ORDER — SEMAGLUTIDE(0.25 OR 0.5MG/DOS) 2 MG/3ML ~~LOC~~ SOPN: 0.5000 mg | PEN_INJECTOR | SUBCUTANEOUS | 0 refills | Status: DC

## 2022-02-11 NOTE — Progress Notes (Signed)
Subjective:    Patient ID: Jonathan [name], male    DOB: 06-Sep-1955, 66 y.o.   MRN: [id number]  HPI 66 year old male who  has a past medical history of Anxiety, Asthma, BPH (benign prostatic hyperplasia), ED (erectile dysfunction), Elevated PSA, Esophageal reflux, Hearing loss, History of hip replacement, History of knee replacement, Hypertension, Lactose intolerance, Numbness in both legs, OSA on CPAP (2019), Pre-diabetes, Prostate cancer [facility]) (Jan 2016), and Vitamin D deficiency.  He presents to the office today for one month follow up regarding prediabetes and obesity. Last month he was started on Ozempic 0.25 mg weekly. He has tolerated this medication well and has noticed a slight decrease in appetite. He has not had any n/v/d/c.   Wt Readings from Last 3 Encounters:  02/11/22 (!) 301 lb (136.5 kg)  01/07/22 (!) 308 lb 8 oz (139.9 kg)  12/16/21 (!) 305 lb (138.3 kg)   Review of Systems See HPI   Past Medical History:  Diagnosis Date   Anxiety    Asthma    BPH (benign prostatic hyperplasia)    ED (erectile dysfunction)    Elevated PSA    9.33   Esophageal reflux    Hearing loss    History of hip replacement    History of knee replacement    Hypertension    Lactose intolerance    Numbness in both legs    OSA on CPAP 2019   Pre-diabetes    Prostate cancer ([place]) Jan 2016   prostatectomy   Vitamin D deficiency     Social History   Socioeconomic History   Marital status: Married    Spouse name: Not on file   Number of children: 2   Years of education: Not on file   Highest education level: Not on file  Occupational History   Occupation: Education administrator: [employer]  Tobacco Use   Smoking status: Never    Passive exposure: Never   Smokeless tobacco: Never  Vaping Use   Vaping Use: Never used  Substance and Sexual Activity   Alcohol use: No   Drug use: No   Sexual activity: Yes  Other Topics Concern   Not on file  Social History Narrative   Not on file    Social Determinants of Health   Financial Resource Strain: Low Risk  (10/12/2021)   Overall Financial Resource Strain (CARDIA)    Difficulty of Paying Living Expenses: Not hard at all  Food Insecurity: No Food Insecurity (10/12/2021)   Hunger Vital Sign    Worried About Running Out of Food in the Last Year: Never true    [place] in the Last Year: Never true  Transportation Needs: No Transportation Needs (10/12/2021)   PRAPARE - Hydrologist (Medical): No    Lack of Transportation (Non-Medical): No  Physical Activity: Unknown (10/12/2021)   Exercise Vital Sign    Days of Exercise per Week: Patient refused    Minutes of Exercise per Session: Patient refused  Stress: No Stress Concern Present (10/12/2021)   [place]    Feeling of Stress : Not at all  Social Connections: [place] (10/12/2021)   Social Connection and Isolation Panel [NHANES]    Frequency of Communication with Friends and Family: More than three times a week    Frequency of Social Gatherings with Friends and Family: More than three times a week  Attends Religious Services: More than 4 times per year    Active Member of Clubs or Organizations: Yes    Attends Archivist Meetings: More than 4 times per year    Marital Status: Married  Human resources officer Violence: Not At Risk (10/12/2021)   Humiliation, Afraid, Rape, and Kick questionnaire    Fear of Current or Ex-Partner: No    Emotionally Abused: No    Physically Abused: No    Sexually Abused: No    Past Surgical History:  Procedure Laterality Date   CORONARY CALCIUM SCORE / CARDIAC CT ANGIOGRAM  11/16/2019   Cor Ca++ Score ZERO. NO evidence of CAD. Dominant RCA-<PDA & PLVB with minimal (<25%) calcified plaque. LM no plaque -< LAD & LCx. LAD w/ large D1 - no plaque. Non-dom LCx with 1 Large OM1 - no plaque. Normal PV drainage. Normal LAA & PA size.     FOOT SURGERY     bilateral foot   NM MYOVIEW LTD  07/2007   Normal nuclear stress test.  Mild fixed defect in the anterior wall likely related to soft tissue attenuation.  Normal EF.  Fair exercise capacity.   PROSTATE BIOPSY  06/13/2012   right lat mid gleason 3+3=6   PROSTATECTOMY  2016   TOTAL HIP ARTHROPLASTY  2008 &2007   Right and left   TRANSTHORACIC ECHOCARDIOGRAM  09/2017   EF 55-60%. ??  Normal diastolic function.  No R WMA.  Moderate pulmonary hypertension-PA P estimated 59 mmHg.   TRANSTHORACIC ECHOCARDIOGRAM  11/13/2019    EF 55-60%. NO RWMA. Mild LV dilation & Gr 1 DD. MIld LA dilation. Normal valves. Normal CVP.    Family History  Problem Relation Age of Onset   Hypertension Mother    Colon cancer Mother    Obesity Mother    Prostate cancer Father 65       alive now age 35   Asthma Father    Heart disease Father        Pt is not sure of details (no MI)   Alcoholism Father    Obesity Father    Tuberculosis Brother    Lung cancer Maternal Grandmother        non-smoker    Allergies  Allergen Reactions   Codeine     REACTION: emesis   Cozaar [Losartan Potassium] Hives   Norvasc [Amlodipine] Swelling    Lower extremity edema     Current Outpatient Medications on File Prior to Visit  Medication Sig Dispense Refill   atenolol-chlorthalidone (TENORETIC) 50-25 MG tablet Take 1 tablet by mouth daily. 90 tablet 3   mirabegron ER (MYRBETRIQ) 50 MG TB24 tablet Take 1 tablet (50 mg total) by mouth daily. 30 tablet 0   potassium chloride (KLOR-CON) 10 MEQ tablet Take 1 tablet (10 mEq total) by mouth daily. 90 tablet 3   tadalafil (CIALIS) 20 MG tablet Take 1 tablet (20 mg total) by mouth daily as needed for erectile dysfunction. 30 tablet 0   No current facility-administered medications on file prior to visit.    BP 130/88   Pulse (!) 50   Temp 98.4 F (36.9 C) (Oral)   Ht '5\' 11"'$  (1.803 m)   Wt (!) 301 lb (136.5 kg)   SpO2 96%   BMI 41.98 kg/m        Objective:   Physical Exam Vitals reviewed.  Cardiovascular:     Rate and Rhythm: Normal rate and regular rhythm.     Pulses: Normal pulses.  Heart sounds: Normal heart sounds.  Pulmonary:     Effort: Pulmonary effort is normal.     Breath sounds: Normal breath sounds.  Musculoskeletal:        General: Normal range of motion.  Skin:    General: Skin is warm and dry.     Capillary Refill: Capillary refill takes less than 2 seconds.  Neurological:     General: No focal deficit present.     Mental Status: He is oriented to person, place, and time.        Assessment & Plan:  1. Prediabetes - Doing well with the medication. Will have him increase to 0.5 mg for 30 days and then send me a note via mychart to let me know how he is doing.  - Semaglutide,0.25 or 0.'5MG'$ /DOS, 2 MG/3ML SOPN; Inject 0.5 mg into the skin once a week.  Dispense: 3 mL; Refill: 0  2. Class 2 severe obesity due to excess calories with serious comorbidity and body mass index (BMI) of 39.0 to 39.9 in adult ([place])  - Semaglutide,0.25 or 0.'5MG'$ /DOS, 2 MG/3ML SOPN; Inject 0.5 mg into the skin once a week.  Dispense: 3 mL; Refill: 0  Dorothyann Peng, NP

## 2022-02-15 ENCOUNTER — Other Ambulatory Visit: Payer: Self-pay | Admitting: Adult Health

## 2022-02-15 ENCOUNTER — Ambulatory Visit: Payer: Medicare Other | Admitting: Pulmonary Disease

## 2022-02-15 ENCOUNTER — Encounter: Payer: Self-pay | Admitting: Pulmonary Disease

## 2022-02-15 VITALS — BP 136/78 | HR 55 | Temp 97.7°F | Ht 71.0 in | Wt 311.0 lb

## 2022-02-15 DIAGNOSIS — G4733 Obstructive sleep apnea (adult) (pediatric): Secondary | ICD-10-CM

## 2022-02-15 DIAGNOSIS — R7303 Prediabetes: Secondary | ICD-10-CM

## 2022-02-15 NOTE — Patient Instructions (Signed)
We will make sure we send a prescription to [facility] for CPAP  Auto CPAP 5-20  Continue using CPAP nightly  I will see you back in a year  Call with significant concerns

## 2022-02-15 NOTE — Progress Notes (Signed)
Jonathan [name]    [id number]    11/08/1955  Primary Care Physician:Nafziger, Tommi Rumps, NP  Referring Physician: Dorothyann Peng, NP [place],  [place] [id number]  Chief complaint:   Obstructive sleep apnea  HPI:  Continues to be compliant with CPAP nightly  Uses CPAP nightly with no significant concerns Tolerating it well  Did have some issues with his knees for which he is being followed up for Recently started on [place] well, waking up like he is at a good nights rest  Scheduled for a new machine in January  Loud breathing even when taking a nap during the day  Has gained a lot of weight since retiring from the [employer]-he is changing many things to help him lose weight Denies a headache, he does have a dry throat of the mornings History of prostate cancer Orthopnea  Occupation: Worked for the postal service Exposures: No exposure to mold Smoking history: Non-smoker  Outpatient Encounter Medications as of 02/15/2022  Medication Sig   atenolol-chlorthalidone (TENORETIC) 50-25 MG tablet Take 1 tablet by mouth daily.   potassium chloride (KLOR-CON) 10 MEQ tablet Take 1 tablet (10 mEq total) by mouth daily.   Semaglutide,0.25 or 0.'5MG'$ /DOS, 2 MG/3ML SOPN Inject 0.5 mg into the skin once a week.   tadalafil (CIALIS) 20 MG tablet Take 1 tablet (20 mg total) by mouth daily as needed for erectile dysfunction.   mirabegron ER (MYRBETRIQ) 50 MG TB24 tablet Take 1 tablet (50 mg total) by mouth daily. (Patient not taking: Reported on 02/15/2022)   No facility-administered encounter medications on file as of 02/15/2022.    Allergies as of 02/15/2022 - Review Complete 02/15/2022  Allergen Reaction Noted   Codeine     Cozaar [losartan potassium] Hives 09/08/2011   Norvasc [amlodipine] Swelling 05/20/2020    Past Medical History:  Diagnosis Date   Anxiety    Asthma    BPH (benign prostatic hyperplasia)    ED (erectile dysfunction)    Elevated  PSA    9.33   Esophageal reflux    Hearing loss    History of hip replacement    History of knee replacement    Hypertension    Lactose intolerance    Numbness in both legs    OSA on CPAP 2019   Pre-diabetes    Prostate cancer [facility]) Jan 2016   prostatectomy   Vitamin D deficiency     Past Surgical History:  Procedure Laterality Date   CORONARY CALCIUM SCORE / CARDIAC CT ANGIOGRAM  11/16/2019   Cor Ca++ Score ZERO. NO evidence of CAD. Dominant RCA-<PDA & PLVB with minimal (<25%) calcified plaque. LM no plaque -< LAD & LCx. LAD w/ large D1 - no plaque. Non-dom LCx with 1 Large OM1 - no plaque. Normal PV drainage. Normal LAA & PA size.    FOOT SURGERY     bilateral foot   NM MYOVIEW LTD  07/2007   Normal nuclear stress test.  Mild fixed defect in the anterior wall likely related to soft tissue attenuation.  Normal EF.  Fair exercise capacity.   PROSTATE BIOPSY  06/13/2012   right lat mid gleason 3+3=6   PROSTATECTOMY  2016   TOTAL HIP ARTHROPLASTY  2008 &2007   Right and left   TRANSTHORACIC ECHOCARDIOGRAM  09/2017   EF 55-60%. ??  Normal diastolic function.  No R WMA.  Moderate pulmonary hypertension-PA P estimated 59 mmHg.   TRANSTHORACIC  ECHOCARDIOGRAM  11/13/2019    EF 55-60%. NO RWMA. Mild LV dilation & Gr 1 DD. MIld LA dilation. Normal valves. Normal CVP.    Family History  Problem Relation Age of Onset   Hypertension Mother    Colon cancer Mother    Obesity Mother    Prostate cancer Father 30       alive now age 65   Asthma Father    Heart disease Father        Pt is not sure of details (no MI)   Alcoholism Father    Obesity Father    Tuberculosis Brother    Lung cancer Maternal Grandmother        non-smoker    Social History   Socioeconomic History   Marital status: Married    Spouse name: Not on file   Number of children: 2   Years of education: Not on file   Highest education level: Not on file  Occupational History   Occupation: Education administrator:  [employer]  Tobacco Use   Smoking status: Never    Passive exposure: Never   Smokeless tobacco: Never  Vaping Use   Vaping Use: Never used  Substance and Sexual Activity   Alcohol use: No   Drug use: No   Sexual activity: Yes  Other Topics Concern   Not on file  Social History Narrative   Not on file   Social Determinants of Health   Financial Resource Strain: Low Risk  (10/12/2021)   Overall Financial Resource Strain (CARDIA)    Difficulty of Paying Living Expenses: Not hard at all  Food Insecurity: No Food Insecurity (10/12/2021)   Hunger Vital Sign    Worried About Running Out of Food in the Last Year: Never true    [place] in the Last Year: Never true  Transportation Needs: No Transportation Needs (10/12/2021)   [facility] - Hydrologist (Medical): No    Lack of Transportation (Non-Medical): No  Physical Activity: Unknown (10/12/2021)   Exercise Vital Sign    Days of Exercise per Week: Patient refused    Minutes of Exercise per Session: Patient refused  Stress: No Stress Concern Present (10/12/2021)   [place]    Feeling of Stress : Not at all  Social Connections: [place] (10/12/2021)   Social Connection and Isolation Panel [NHANES]    Frequency of Communication with Friends and Family: More than three times a week    Frequency of Social Gatherings with Friends and Family: More than three times a week    Attends Religious Services: More than 4 times per year    Active Member of [employer] or Organizations: Yes    Attends Music therapist: More than 4 times per year    Marital Status: Married  Human resources officer Violence: Not At Risk (10/12/2021)   Humiliation, Afraid, Rape, and Kick questionnaire    Fear of Current or Ex-Partner: No    Emotionally Abused: No    Physically Abused: No    Sexually Abused: No    Review of systems: Review of systems Denies any fever  or chills No visual symptoms No shortness of breath with normal activity Was having some bradycardia with his blood pressure medications All other system review is normal  Physical Exam:  Vitals:   02/15/22 1311  BP: 136/78  Pulse: (!) 55  Temp: 97.7 F (36.5 C)  SpO2:  97%   Middle-age gentleman does not appear to be in distress Mallampati 3, neck is thick Clear breath sounds bilaterally S1-S2 appreciated Bowel sounds appreciated Mild peripheral edema      03/21/2018    3:00 PM 11/22/2017   11:00 AM  Results of the Epworth flowsheet  Sitting and reading 0 3  Watching TV 0 3  Sitting, inactive in a public place (e.g. a theatre or a meeting) 0 3  As a passenger in a car for an hour without a break 0 3  Lying down to rest in the afternoon when circumstances permit 3 3  Sitting and talking to someone 0 2  Sitting quietly after a lunch without alcohol 0 2  In a car, while stopped for a few minutes in traffic 0 0  Total score 3 19    Data Reviewed: [name]  Recent CT, coronary CT noted  .  Cardiopulmonary exercise study result noted -Compliance revealed 67% compliance, residual AHI of 5.7 -We discussed improving compliance  PFT shows no obstruction, no significant bronchodilator response, moderate restriction with mild reduction in diffusing capacity  Compliance shows 100% set between 5 and 20 Residual AHI of 2.6, 95 percentile pressure of 16.4  Assessment:  .  Obstructive sleep apnea adequately treated with CPAP therapy  .  Class III obesity  .  Pulmonary hypertension  .  Daytime sleepiness is improving   Plan/Recommendations: Continue CPAP on a nightly basis  Regular exercise as tolerated  Hopefully able to lose weight with addition of Ozempic  Follow-up in a year  DME referral for new machine   Sherrilyn Rist MD [facility] 02/15/2022, 1:14 PM  CC: Dorothyann Peng, NP

## 2022-02-16 MED ORDER — SEMAGLUTIDE(0.25 OR 0.5MG/DOS) 2 MG/3ML ~~LOC~~ SOPN: 0.5000 mg | PEN_INJECTOR | SUBCUTANEOUS | 0 refills | Status: DC

## 2022-03-12 DIAGNOSIS — G4733 Obstructive sleep apnea (adult) (pediatric): Secondary | ICD-10-CM | POA: Diagnosis not present

## 2022-03-25 DIAGNOSIS — H52203 Unspecified astigmatism, bilateral: Secondary | ICD-10-CM | POA: Diagnosis not present

## 2022-03-25 DIAGNOSIS — H2513 Age-related nuclear cataract, bilateral: Secondary | ICD-10-CM | POA: Diagnosis not present

## 2022-03-26 DIAGNOSIS — M25562 Pain in left knee: Secondary | ICD-10-CM | POA: Diagnosis not present

## 2022-04-01 ENCOUNTER — Other Ambulatory Visit: Payer: Self-pay | Admitting: Adult Health

## 2022-04-01 DIAGNOSIS — R7303 Prediabetes: Secondary | ICD-10-CM

## 2022-04-12 DIAGNOSIS — G4733 Obstructive sleep apnea (adult) (pediatric): Secondary | ICD-10-CM | POA: Diagnosis not present

## 2022-05-07 ENCOUNTER — Ambulatory Visit (INDEPENDENT_AMBULATORY_CARE_PROVIDER_SITE_OTHER): Payer: Medicare Other | Admitting: Adult Health

## 2022-05-07 ENCOUNTER — Encounter: Payer: Self-pay | Admitting: Adult Health

## 2022-05-07 ENCOUNTER — Other Ambulatory Visit: Payer: Self-pay | Admitting: Adult Health

## 2022-05-07 VITALS — BP 116/70 | HR 50 | Temp 98.2°F | Ht 71.0 in | Wt 308.0 lb

## 2022-05-07 DIAGNOSIS — K5900 Constipation, unspecified: Secondary | ICD-10-CM

## 2022-05-07 DIAGNOSIS — R634 Abnormal weight loss: Secondary | ICD-10-CM | POA: Diagnosis not present

## 2022-05-07 DIAGNOSIS — E876 Hypokalemia: Secondary | ICD-10-CM

## 2022-05-07 DIAGNOSIS — R600 Localized edema: Secondary | ICD-10-CM | POA: Diagnosis not present

## 2022-05-07 DIAGNOSIS — R109 Unspecified abdominal pain: Secondary | ICD-10-CM

## 2022-05-07 DIAGNOSIS — R7303 Prediabetes: Secondary | ICD-10-CM | POA: Diagnosis not present

## 2022-05-07 LAB — URINALYSIS, ROUTINE W REFLEX MICROSCOPIC
Bilirubin Urine: NEGATIVE
Hgb urine dipstick: NEGATIVE
Ketones, ur: NEGATIVE
Leukocytes,Ua: NEGATIVE
Nitrite: NEGATIVE
RBC / HPF: NONE SEEN (ref 0–?)
Specific Gravity, Urine: 1.015 (ref 1.000–1.030)
Total Protein, Urine: NEGATIVE
Urine Glucose: NEGATIVE
Urobilinogen, UA: 0.2 (ref 0.0–1.0)
pH: 7.5 (ref 5.0–8.0)

## 2022-05-07 LAB — BASIC METABOLIC PANEL
BUN: 11 mg/dL (ref 6–23)
CO2: 36 mEq/L — ABNORMAL HIGH (ref 19–32)
Calcium: 9.6 mg/dL (ref 8.4–10.5)
Chloride: 93 mEq/L — ABNORMAL LOW (ref 96–112)
Creatinine, Ser: 0.73 mg/dL (ref 0.40–1.50)
GFR: 95.49 mL/min (ref >60.00)
Glucose, Bld: 97 mg/dL (ref 70–99)
Potassium: 3.2 mEq/L — ABNORMAL LOW (ref 3.5–5.1)
Sodium: 139 mEq/L (ref 135–145)

## 2022-05-07 LAB — HEMOGLOBIN A1C: Hgb A1c MFr Bld: 6.2 % (ref 4.6–6.5)

## 2022-05-07 MED ORDER — POTASSIUM CHLORIDE CRYS ER 20 MEQ PO TBCR: 20.0000 meq | EXTENDED_RELEASE_TABLET | Freq: Every day | ORAL | 3 refills | Status: DC

## 2022-05-07 NOTE — Progress Notes (Signed)
Subjective:    Patient ID: Jonathan [name], male    DOB: 1955/11/06, 67 y.o.   MRN: [id number]  HPI  67 year old male who  has a past medical history of Anxiety, Asthma, BPH (benign prostatic hyperplasia), ED (erectile dysfunction), Elevated PSA, Esophageal reflux, Hearing loss, History of hip replacement, History of knee replacement, Hypertension, Lactose intolerance, Numbness in both legs, OSA on CPAP (2019), Pre-diabetes, Prostate cancer [facility]) (Jan 2016), and Vitamin D deficiency.  He presents to the office today for an acute issue. He reports that over the last two weeks his lower extremities have been swollen. Today he has no swelling. He reports that he was also having muscle cramps last week and noticed that he was dehydrated. He drank a lot of water and the muscle cramps went away and so did the swelling in his legs.   He also reports that when we increased his dose of ozempic to 0.5 mg he had a lot of GI issues so he stopped the medication. He did not have any side effects on the 0.25 mg dose and had good results with it.   Wt Readings from Last 3 Encounters:  05/07/22 (!) 308 lb (139.7 kg)  02/15/22 (!) 311 lb (141.1 kg)  02/11/22 (!) 301 lb (136.5 kg)   Furthermore, he has been experiencing right flank pain for the last week. Pain is felt as a dull ache and is present throughout the day.  No increased pain with twisting or bending.  Has not had any fevers or chills.  No history of kidney stones.  He does report being constipated. No signs of UTI   Review of Systems See HPI   Past Medical History:  Diagnosis Date   Anxiety    Asthma    BPH (benign prostatic hyperplasia)    ED (erectile dysfunction)    Elevated PSA    9.33   Esophageal reflux    Hearing loss    History of hip replacement    History of knee replacement    Hypertension    Lactose intolerance    Numbness in both legs    OSA on CPAP 2019   Pre-diabetes    Prostate cancer [facility]) Jan 2016   prostatectomy    Vitamin D deficiency     Social History   Socioeconomic History   Marital status: Married    Spouse name: Not on file   Number of children: 2   Years of education: Not on file   Highest education level: Not on file  Occupational History   Occupation: Education administrator: [employer]  Tobacco Use   Smoking status: Never    Passive exposure: Never   Smokeless tobacco: Never  Vaping Use   Vaping Use: Never used  Substance and Sexual Activity   Alcohol use: No   Drug use: No   Sexual activity: Yes  Other Topics Concern   Not on file  Social History Narrative   Not on file   Social Determinants of Health   Financial Resource Strain: Low Risk  (10/12/2021)   Overall Financial Resource Strain (CARDIA)    Difficulty of Paying Living Expenses: Not hard at all  Food Insecurity: No Food Insecurity (10/12/2021)   Hunger Vital Sign    Worried About Running Out of Food in the Last Year: Never true    [place] in the Last Year: Never true  Transportation Needs: No Transportation Needs (10/12/2021)   [place] -  Hydrologist (Medical): No    Lack of Transportation (Non-Medical): No  Physical Activity: Unknown (10/12/2021)   Exercise Vital Sign    Days of Exercise per Week: Patient refused    Minutes of Exercise per Session: Patient refused  Stress: No Stress Concern Present (10/12/2021)   [place]    Feeling of Stress : Not at all  Social Connections: [place] (10/12/2021)   Social Connection and Isolation Panel [NHANES]    Frequency of Communication with Friends and Family: More than three times a week    Frequency of Social Gatherings with Friends and Family: More than three times a week    Attends Religious Services: More than 4 times per year    Active Member of [employer] or Organizations: Yes    Attends Archivist Meetings: More than 4 times per year    Marital Status:  Married  Human resources officer Violence: Not At Risk (10/12/2021)   Humiliation, Afraid, Rape, and Kick questionnaire    Fear of Current or Ex-Partner: No    Emotionally Abused: No    Physically Abused: No    Sexually Abused: No    Past Surgical History:  Procedure Laterality Date   CORONARY CALCIUM SCORE / CARDIAC CT ANGIOGRAM  11/16/2019   Cor Ca++ Score ZERO. NO evidence of CAD. Dominant RCA-<PDA & PLVB with minimal (<25%) calcified plaque. LM no plaque -< LAD & LCx. LAD w/ large D1 - no plaque. Non-dom LCx with 1 Large OM1 - no plaque. Normal PV drainage. Normal LAA & PA size.    FOOT SURGERY     bilateral foot   NM MYOVIEW LTD  07/2007   Normal nuclear stress test.  Mild fixed defect in the anterior wall likely related to soft tissue attenuation.  Normal EF.  Fair exercise capacity.   PROSTATE BIOPSY  06/13/2012   right lat mid gleason 3+3=6   PROSTATECTOMY  2016   TOTAL HIP ARTHROPLASTY  2008 &2007   Right and left   TRANSTHORACIC ECHOCARDIOGRAM  09/2017   EF 55-60%. ??  Normal diastolic function.  No R WMA.  Moderate pulmonary hypertension-PA P estimated 59 mmHg.   TRANSTHORACIC ECHOCARDIOGRAM  11/13/2019    EF 55-60%. NO RWMA. Mild LV dilation & Gr 1 DD. MIld LA dilation. Normal valves. Normal CVP.    Family History  Problem Relation Age of Onset   Hypertension Mother    Colon cancer Mother    Obesity Mother    Prostate cancer Father 58       alive now age 45   Asthma Father    Heart disease Father        Pt is not sure of details (no MI)   Alcoholism Father    Obesity Father    Tuberculosis Brother    Lung cancer Maternal Grandmother        non-smoker    Allergies  Allergen Reactions   Codeine     REACTION: emesis   Cozaar [Losartan Potassium] Hives   Norvasc [Amlodipine] Swelling    Lower extremity edema     Current Outpatient Medications on File Prior to Visit  Medication Sig Dispense Refill   atenolol-chlorthalidone (TENORETIC) 50-25 MG tablet Take 1 tablet  by mouth daily. 90 tablet 3   mirabegron ER (MYRBETRIQ) 50 MG TB24 tablet Take 1 tablet (50 mg total) by mouth daily. 30 tablet 0   potassium chloride (KLOR-CON) 10  MEQ tablet Take 1 tablet (10 mEq total) by mouth daily. 90 tablet 3   Semaglutide,0.25 or 0.'5MG'$ /DOS, (OZEMPIC, 0.25 OR 0.5 MG/DOSE,) 2 MG/3ML SOPN INJECT 0.5 MG INTO THE SKIN ONCE A WEEK. 3 mL 1   tadalafil (CIALIS) 20 MG tablet Take 1 tablet (20 mg total) by mouth daily as needed for erectile dysfunction. 30 tablet 0   No current facility-administered medications on file prior to visit.    BP 116/70   Pulse (!) 50   Temp 98.2 F (36.8 C) (Oral)   Ht '5\' 11"'$  (1.803 m)   Wt (!) 308 lb (139.7 kg)   SpO2 95%   BMI 42.96 kg/m       Objective:   Physical Exam Vitals and nursing note reviewed.  Constitutional:      Appearance: Normal appearance.  Cardiovascular:     Rate and Rhythm: Normal rate and regular rhythm.     Pulses: Normal pulses.     Heart sounds: Normal heart sounds.  Pulmonary:     Effort: Pulmonary effort is normal.     Breath sounds: Normal breath sounds.  Abdominal:     General: Bowel sounds are normal.     Palpations: There is no mass.     Tenderness: There is abdominal tenderness (right mid flank). There is no right CVA tenderness or left CVA tenderness.     Hernia: No hernia is present.  Musculoskeletal:     Right lower leg: No edema.     Left lower leg: No edema.  Skin:    General: Skin is warm and dry.  Neurological:     General: No focal deficit present.     Mental Status: He is alert and oriented to person, place, and time.  Psychiatric:        Mood and Affect: Mood normal.        Behavior: Behavior normal.        Thought Content: Thought content normal.       Assessment & Plan:  1. Lower extremity edema -Resolved.  Likely from dehydration and sodium intake.  Encouraged low-sodium diet and adequate hydration throughout the day  2. Right flank pain - will check UA  today   -Possibly due to constipation. -We will have him go back on MiraLAX to have a normal bowel movement, if pain persist please let me know - Urinalysis with Reflex Microscopic; Future - Urinalysis with Reflex Microscopic  3. Prediabetes - Go back on ozempic  0.25 mg weekly  - Hemoglobin A1c; Future - Basic Metabolic Panel; Future - Basic Metabolic Panel - Hemoglobin A1c  4. Weight loss - Will have him decrease wegovy dose to 0.25 mg and keep him on that for awhile. He still has plenty of medication at home   5. Constipation, unspecified constipation type - Encouraged hydration  - Add miralax   Dorothyann Peng, NP

## 2022-05-13 DIAGNOSIS — G4733 Obstructive sleep apnea (adult) (pediatric): Secondary | ICD-10-CM | POA: Diagnosis not present

## 2022-05-14 DIAGNOSIS — G4733 Obstructive sleep apnea (adult) (pediatric): Secondary | ICD-10-CM | POA: Diagnosis not present

## 2022-05-24 ENCOUNTER — Other Ambulatory Visit: Payer: Self-pay

## 2022-05-24 ENCOUNTER — Emergency Department (HOSPITAL_BASED_OUTPATIENT_CLINIC_OR_DEPARTMENT_OTHER): Payer: Medicare Other

## 2022-05-24 ENCOUNTER — Emergency Department (HOSPITAL_BASED_OUTPATIENT_CLINIC_OR_DEPARTMENT_OTHER)
Admission: EM | Admit: 2022-05-24 | Discharge: 2022-05-24 | Disposition: A | Discharge status: Home / Self Care | Payer: Medicare Other | Attending: Emergency Medicine | Admitting: Emergency Medicine

## 2022-05-24 ENCOUNTER — Encounter (HOSPITAL_BASED_OUTPATIENT_CLINIC_OR_DEPARTMENT_OTHER): Payer: Self-pay | Admitting: Emergency Medicine

## 2022-05-24 DIAGNOSIS — M5030 Other cervical disc degeneration, unspecified cervical region: Secondary | ICD-10-CM | POA: Diagnosis not present

## 2022-05-24 DIAGNOSIS — M545 Low back pain, unspecified: Secondary | ICD-10-CM | POA: Insufficient documentation

## 2022-05-24 DIAGNOSIS — Z041 Encounter for examination and observation following transport accident: Secondary | ICD-10-CM | POA: Diagnosis not present

## 2022-05-24 DIAGNOSIS — R519 Headache, unspecified: Secondary | ICD-10-CM | POA: Diagnosis not present

## 2022-05-24 DIAGNOSIS — M25552 Pain in left hip: Secondary | ICD-10-CM | POA: Diagnosis not present

## 2022-05-24 DIAGNOSIS — Y92410 Unspecified street and highway as the place of occurrence of the external cause: Secondary | ICD-10-CM | POA: Insufficient documentation

## 2022-05-24 DIAGNOSIS — M542 Cervicalgia: Secondary | ICD-10-CM | POA: Diagnosis not present

## 2022-05-24 MED ORDER — METHOCARBAMOL 500 MG PO TABS: 500.0000 mg | ORAL_TABLET | Freq: Two times a day (BID) | ORAL | 0 refills | Status: DC

## 2022-05-24 MED ORDER — TRAMADOL HCL 50 MG PO TABS: 50.0000 mg | ORAL_TABLET | Freq: Four times a day (QID) | ORAL | 0 refills | Status: DC | PRN
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
## 2022-05-24 NOTE — ED Notes (Signed)
Patient transported to CT 
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
## 2022-05-24 NOTE — Discharge Instructions (Addendum)

## 2022-05-28 DIAGNOSIS — M25552 Pain in left hip: Secondary | ICD-10-CM | POA: Diagnosis not present

## 2022-06-04 DIAGNOSIS — R531 Weakness: Secondary | ICD-10-CM | POA: Diagnosis not present

## 2022-06-04 DIAGNOSIS — M25552 Pain in left hip: Secondary | ICD-10-CM | POA: Diagnosis not present

## 2022-06-11 DIAGNOSIS — G4733 Obstructive sleep apnea (adult) (pediatric): Secondary | ICD-10-CM | POA: Diagnosis not present

## 2022-06-23 ENCOUNTER — Telehealth: Payer: Self-pay | Admitting: Adult Health

## 2022-06-23 NOTE — Telephone Encounter (Signed)
Peripheral artery disease screening showed left foot mild 0.87 right foot borderline 0.96. this was his yearly housecalls visit.

## 2022-06-28 DIAGNOSIS — Z800 Family history of malignant neoplasm of digestive organs: Secondary | ICD-10-CM | POA: Diagnosis not present

## 2022-06-28 DIAGNOSIS — R1084 Generalized abdominal pain: Secondary | ICD-10-CM | POA: Diagnosis not present

## 2022-06-28 DIAGNOSIS — K921 Melena: Secondary | ICD-10-CM | POA: Diagnosis not present

## 2022-06-28 DIAGNOSIS — Z86010 Personal history of colonic polyps: Secondary | ICD-10-CM | POA: Diagnosis not present

## 2022-07-08 ENCOUNTER — Other Ambulatory Visit: Payer: Medicare Other

## 2022-07-08 DIAGNOSIS — C61 Malignant neoplasm of prostate: Secondary | ICD-10-CM

## 2022-07-09 LAB — PSA: Prostate Specific Ag, Serum: <0.1 ng/mL (ref 0.0–4.0)

## 2022-07-12 DIAGNOSIS — G4733 Obstructive sleep apnea (adult) (pediatric): Secondary | ICD-10-CM | POA: Diagnosis not present

## 2022-07-26 DIAGNOSIS — M25552 Pain in left hip: Secondary | ICD-10-CM | POA: Diagnosis not present

## 2022-08-03 DIAGNOSIS — D125 Benign neoplasm of sigmoid colon: Secondary | ICD-10-CM | POA: Diagnosis not present

## 2022-08-03 DIAGNOSIS — Z09 Encounter for follow-up examination after completed treatment for conditions other than malignant neoplasm: Secondary | ICD-10-CM | POA: Diagnosis not present

## 2022-08-03 DIAGNOSIS — Z86010 Personal history of colonic polyps: Secondary | ICD-10-CM | POA: Diagnosis not present

## 2022-08-03 DIAGNOSIS — D175 Benign lipomatous neoplasm of intra-abdominal organs: Secondary | ICD-10-CM | POA: Diagnosis not present

## 2022-08-03 DIAGNOSIS — K5730 Diverticulosis of large intestine without perforation or abscess without bleeding: Secondary | ICD-10-CM | POA: Diagnosis not present

## 2022-08-03 DIAGNOSIS — K648 Other hemorrhoids: Secondary | ICD-10-CM | POA: Diagnosis not present

## 2022-08-03 DIAGNOSIS — D123 Benign neoplasm of transverse colon: Secondary | ICD-10-CM | POA: Diagnosis not present

## 2022-08-10 ENCOUNTER — Ambulatory Visit (INDEPENDENT_AMBULATORY_CARE_PROVIDER_SITE_OTHER): Payer: Medicare Other | Admitting: Adult Health

## 2022-08-10 ENCOUNTER — Encounter: Payer: Self-pay | Admitting: Adult Health

## 2022-08-10 VITALS — BP 130/88 | HR 56 | Temp 98.0°F | Wt 310.0 lb

## 2022-08-10 DIAGNOSIS — I10 Essential (primary) hypertension: Secondary | ICD-10-CM | POA: Diagnosis not present

## 2022-08-10 DIAGNOSIS — Z6839 Body mass index (BMI) 39.0-39.9, adult: Secondary | ICD-10-CM | POA: Diagnosis not present

## 2022-08-10 DIAGNOSIS — R7303 Prediabetes: Secondary | ICD-10-CM

## 2022-08-10 LAB — POCT GLYCOSYLATED HEMOGLOBIN (HGB A1C): Hemoglobin A1C: 5.6 % (ref 4.0–5.6)

## 2022-08-10 MED ORDER — ATENOLOL 50 MG PO TABS
50.0000 mg | ORAL_TABLET | Freq: Every day | ORAL | 0 refills | Status: DC
Start: 2022-08-10 — End: 2022-11-09

## 2022-08-10 MED ORDER — SEMAGLUTIDE(0.25 OR 0.5MG/DOS) 2 MG/3ML ~~LOC~~ SOPN: 0.2500 mg | PEN_INJECTOR | SUBCUTANEOUS | 0 refills | Status: DC

## 2022-08-10 MED ORDER — ONDANSETRON HCL 4 MG PO TABS: 4.0000 mg | ORAL_TABLET | Freq: Three times a day (TID) | ORAL | 3 refills | Status: DC | PRN

## 2022-08-10 MED ORDER — HYDROCHLOROTHIAZIDE 12.5 MG PO CAPS
12.5000 mg | ORAL_CAPSULE | Freq: Every day | ORAL | 0 refills | Status: DC
Start: 2022-08-10 — End: 2022-11-09

## 2022-08-10 NOTE — Patient Instructions (Signed)
I am going to send in ozempic 0.25 mg. I have also going to send in some zofran to take as needed for nausea

## 2022-08-10 NOTE — Progress Notes (Signed)
Subjective:    Patient ID: Jonathan [name], male    DOB: May 09, 1955, 67 y.o.   MRN: [id number]  HPI 67 year old male who  has a past medical history of Anxiety, Asthma, BPH (benign prostatic hyperplasia), ED (erectile dysfunction), Elevated PSA, Esophageal reflux, Hearing loss, History of hip replacement, History of knee replacement, Hypertension, Lactose intolerance, Numbness in both legs, OSA on CPAP (2019), Pre-diabetes, Prostate cancer [facility]) (Jan 2016), and Vitamin D deficiency.  He presents to the office today for follow up regarding Prediabets and obesity.  When he was last seen in January 2024 he was using Ozempic 0.5 mg but was having a lot of GI issues.  We decreased his dose to 0.25 mg since he was able to tolerate that dose well. He reports that he did have some nausea for the first two days after starting the 0.25 mg dose but he has not had any medication in multiple months. He is eating healthier but has not been working   Lab Results  Component Value Date   HGBA1C 6.2 05/07/2022   Wt Readings from Last 3 Encounters:  08/10/22 (!) 310 lb (140.6 kg)  05/24/22 (!) 308 lb (139.7 kg)  05/07/22 (!) 308 lb (139.7 kg)    HTN - managed with Tenoretic 50-25 mg daily. He is drinking a lot of water but has to go to the bathroom every 45 minutes. He is not getting a good nights sleep due to frequent urination and this turns into not feeling as though he has enough energy to work out.      Review of Systems See HPI   Past Medical History:  Diagnosis Date   Anxiety    Asthma    BPH (benign prostatic hyperplasia)    ED (erectile dysfunction)    Elevated PSA    9.33   Esophageal reflux    Hearing loss    History of hip replacement    History of knee replacement    Hypertension    Lactose intolerance    Numbness in both legs    OSA on CPAP 2019   Pre-diabetes    Prostate cancer [facility]) Jan 2016   prostatectomy   Vitamin D deficiency     Social History   Socioeconomic  History   Marital status: Married    Spouse name: Not on file   Number of children: 2   Years of education: Not on file   Highest education level: Not on file  Occupational History   Occupation: Air traffic controller: [employer]  Tobacco Use   Smoking status: Never    Passive exposure: Never   Smokeless tobacco: Never  Vaping Use   Vaping Use: Never used  Substance and Sexual Activity   Alcohol use: No   Drug use: No   Sexual activity: Yes  Other Topics Concern   Not on file  Social History Narrative   Not on file   Social Determinants of Health   Financial Resource Strain: Low Risk  (10/12/2021)   Overall Financial Resource Strain (CARDIA)    Difficulty of Paying Living Expenses: Not hard at all  Food Insecurity: No Food Insecurity (10/12/2021)   Hunger Vital Sign    Worried About Running Out of Food in the Last Year: Never true    Ran Out of Food in the Last Year: Never true  Transportation Needs: No Transportation Needs (10/12/2021)   PRAPARE - Administrator, Civil Service (Medical): No  Lack of Transportation (Non-Medical): No  Physical Activity: Patient Declined (10/12/2021)   Exercise Vital Sign    Days of Exercise per Week: Patient declined    Minutes of Exercise per Session: Patient declined  Stress: No Stress Concern Present (10/12/2021)   [employer] of Occupational Health - Occupational Stress Questionnaire    Feeling of Stress : Not at all  Social Connections: Socially Integrated (10/12/2021)   Social Connection and Isolation Panel [NHANES]    Frequency of Communication with Friends and Family: More than three times a week    Frequency of Social Gatherings with Friends and Family: More than three times a week    Attends Religious Services: More than 4 times per year    Active Member of [employer] or Organizations: Yes    Attends Banker Meetings: More than 4 times per year    Marital Status: Married  Catering manager Violence: Not At Risk  (10/12/2021)   Humiliation, Afraid, Rape, and Kick questionnaire    Fear of Current or Ex-Partner: No    Emotionally Abused: No    Physically Abused: No    Sexually Abused: No    Past Surgical History:  Procedure Laterality Date   CORONARY CALCIUM SCORE / CARDIAC CT ANGIOGRAM  11/16/2019   Cor Ca++ Score ZERO. NO evidence of CAD. Dominant RCA-<PDA & PLVB with minimal (<25%) calcified plaque. LM no plaque -< LAD & LCx. LAD w/ large D1 - no plaque. Non-dom LCx with 1 Large OM1 - no plaque. Normal PV drainage. Normal LAA & PA size.    FOOT SURGERY     bilateral foot   NM MYOVIEW LTD  07/2007   Normal nuclear stress test.  Mild fixed defect in the anterior wall likely related to soft tissue attenuation.  Normal EF.  Fair exercise capacity.   PROSTATE BIOPSY  06/13/2012   right lat mid gleason 3+3=6   PROSTATECTOMY  2016   TOTAL HIP ARTHROPLASTY  2008 &2007   Right and left   TRANSTHORACIC ECHOCARDIOGRAM  09/2017   EF 55-60%. ??  Normal diastolic function.  No R WMA.  Moderate pulmonary hypertension-PA P estimated 59 mmHg.   TRANSTHORACIC ECHOCARDIOGRAM  11/13/2019    EF 55-60%. NO RWMA. Mild LV dilation & Gr 1 DD. MIld LA dilation. Normal valves. Normal CVP.    Family History  Problem Relation Age of Onset   Hypertension Mother    Colon cancer Mother    Obesity Mother    Prostate cancer Father 71       alive now age 69   Asthma Father    Heart disease Father        Pt is not sure of details (no MI)   Alcoholism Father    Obesity Father    Tuberculosis Brother    Lung cancer Maternal Grandmother        non-smoker    Allergies  Allergen Reactions   Codeine     REACTION: emesis   Cozaar [Losartan Potassium] Hives   Norvasc [Amlodipine] Swelling    Lower extremity edema     Current Outpatient Medications on File Prior to Visit  Medication Sig Dispense Refill   atenolol-chlorthalidone (TENORETIC) 50-25 MG tablet Take 1 tablet by mouth daily. 90 tablet 3   methocarbamol  (ROBAXIN) 500 MG tablet Take 1 tablet (500 mg total) by mouth 2 (two) times daily. 20 tablet 0   mirabegron ER (MYRBETRIQ) 50 MG TB24 tablet Take 1 tablet (50 mg total) by  mouth daily. 30 tablet 0   potassium chloride SA (KLOR-CON M) 20 MEQ tablet Take 1 tablet (20 mEq total) by mouth daily. 90 tablet 3   Semaglutide,0.25 or 0.5MG /DOS, (OZEMPIC, 0.25 OR 0.5 MG/DOSE,) 2 MG/3ML SOPN INJECT 0.5 MG INTO THE SKIN ONCE A WEEK. (Patient taking differently: Inject 0.25 mg into the skin once a week.) 3 mL 1   tadalafil (CIALIS) 20 MG tablet Take 1 tablet (20 mg total) by mouth daily as needed for erectile dysfunction. 30 tablet 0   traMADol (ULTRAM) 50 MG tablet Take 1 tablet (50 mg total) by mouth every 6 (six) hours as needed. 15 tablet 0   No current facility-administered medications on file prior to visit.    BP 130/88   Pulse (!) 56   Temp 98 F (36.7 C)   Wt (!) 310 lb (140.6 kg)   SpO2 96%   BMI 43.24 kg/m       Objective:   Physical Exam Vitals and nursing note reviewed.  Constitutional:      Appearance: Normal appearance. He is obese.  Cardiovascular:     Rate and Rhythm: Normal rate and regular rhythm.     Pulses: Normal pulses.     Heart sounds: Normal heart sounds.  Pulmonary:     Effort: Pulmonary effort is normal.     Breath sounds: Normal breath sounds.  Skin:    General: Skin is warm and dry.  Neurological:     Mental Status: He is alert and oriented to person, place, and time.  Psychiatric:        Mood and Affect: Mood normal.        Behavior: Behavior normal.        Thought Content: Thought content normal.        Judgment: Judgment normal.       Assessment & Plan:  1. Prediabetes - Will have him give this a good three month try on ozempic  - Follow up in three months  - POC A1c - 5.6  - Semaglutide,0.25 or 0.5MG /DOS, 2 MG/3ML SOPN; Inject 0.25 mg into the skin once a week.  Dispense: 6 mL; Refill: 0 - ondansetron (ZOFRAN) 4 MG tablet; Take 1 tablet (4 mg  total) by mouth every 8 (eight) hours as needed for nausea or vomiting.  Dispense: 20 tablet; Refill: 3  2. Class 2 severe obesity due to excess calories with serious comorbidity and body mass index (BMI) of 39.0 to 39.9 in adult [facility])  - Semaglutide,0.25 or 0.5MG /DOS, 2 MG/3ML SOPN; Inject 0.25 mg into the skin once a week.  Dispense: 6 mL; Refill: 0  3. Essential hypertension - Will decrease his hCTZ to 12.5  - atenolol (TENORMIN) 50 MG tablet; Take 1 tablet (50 mg total) by mouth daily.  Dispense: 90 tablet; Refill: 0 - hydrochlorothiazide (MICROZIDE) 12.5 MG capsule; Take 1 capsule (12.5 mg total) by mouth daily.  Dispense: 90 capsule; Refill: 0  Shirline Frees, NP

## 2022-08-11 DIAGNOSIS — G4733 Obstructive sleep apnea (adult) (pediatric): Secondary | ICD-10-CM | POA: Diagnosis not present

## 2022-08-12 DIAGNOSIS — G4733 Obstructive sleep apnea (adult) (pediatric): Secondary | ICD-10-CM | POA: Diagnosis not present

## 2022-09-11 DIAGNOSIS — G4733 Obstructive sleep apnea (adult) (pediatric): Secondary | ICD-10-CM | POA: Diagnosis not present

## 2022-09-24 ENCOUNTER — Ambulatory Visit (INDEPENDENT_AMBULATORY_CARE_PROVIDER_SITE_OTHER): Payer: Medicare Other | Admitting: Adult Health

## 2022-09-24 ENCOUNTER — Encounter: Payer: Self-pay | Admitting: Adult Health

## 2022-09-24 VITALS — BP 146/90 | HR 52 | Temp 98.2°F | Ht 71.0 in | Wt 308.0 lb

## 2022-09-24 DIAGNOSIS — R7303 Prediabetes: Secondary | ICD-10-CM

## 2022-09-24 DIAGNOSIS — Z6839 Body mass index (BMI) 39.0-39.9, adult: Secondary | ICD-10-CM

## 2022-09-24 DIAGNOSIS — I10 Essential (primary) hypertension: Secondary | ICD-10-CM | POA: Diagnosis not present

## 2022-09-24 DIAGNOSIS — R051 Acute cough: Secondary | ICD-10-CM | POA: Diagnosis not present

## 2022-09-24 DIAGNOSIS — J029 Acute pharyngitis, unspecified: Secondary | ICD-10-CM | POA: Diagnosis not present

## 2022-09-24 LAB — POCT RAPID STREP A (OFFICE): Rapid Strep A Screen: NEGATIVE

## 2022-09-24 MED ORDER — SEMAGLUTIDE(0.25 OR 0.5MG/DOS) 2 MG/3ML ~~LOC~~ SOPN
0.5000 mg | PEN_INJECTOR | SUBCUTANEOUS | 0 refills | Status: AC
Start: 2022-09-24 — End: 2022-10-24

## 2022-09-24 MED ORDER — FLUTICASONE PROPIONATE 50 MCG/ACT NA SUSP
2.0000 | Freq: Every day | NASAL | 0 refills | Status: DC
Start: 2022-09-24 — End: 2022-10-19

## 2022-09-24 NOTE — Progress Notes (Signed)
Subjective:    Patient ID: Jonathan [name], male    DOB: Mar 01, 1956, 67 y.o.   MRN: [id number]  Sinusitis    He presents to the office today for an acute issue. He reports that over the last 3 days he has been experiencing a sore throat, headache and dry cough. He has been taking Nyquil, OTC cold medication, and Claritin without improvement. He has not had any fevers, chills, sinus pain and pressure   Furthermore a month ago he was started on Ozempic 0.25 mg weekly for prediabetes and obesity. He has been tolerating this medication well. He has not noticed any decreased appetite.   Wt Readings from Last 3 Encounters:  09/24/22 (!) 308 lb (139.7 kg)  08/10/22 (!) 310 lb (140.6 kg)  05/24/22 (!) 308 lb (139.7 kg)   Essential hypertension - managed with HCTZ 12.5 mg and Atenolol 50 mg daily. He has been monitoring his blood pressure at home with readings in the 117-120 systolic range    Review of Systems See HPI   Past Medical History:  Diagnosis Date   Anxiety    Asthma    BPH (benign prostatic hyperplasia)    ED (erectile dysfunction)    Elevated PSA    9.33   Esophageal reflux    Hearing loss    History of hip replacement    History of knee replacement    Hypertension    Lactose intolerance    Numbness in both legs    OSA on CPAP 2019   Pre-diabetes    Prostate cancer [facility]) Jan 2016   prostatectomy   Vitamin D deficiency     Social History   Socioeconomic History   Marital status: Married    Spouse name: Not on file   Number of children: 2   Years of education: Not on file   Highest education level: Not on file  Occupational History   Occupation: Air traffic controller: [employer]  Tobacco Use   Smoking status: Never    Passive exposure: Never   Smokeless tobacco: Never  Vaping Use   Vaping Use: Never used  Substance and Sexual Activity   Alcohol use: No   Drug use: No   Sexual activity: Yes  Other Topics Concern   Not on file  Social History Narrative    Not on file   Social Determinants of Health   Financial Resource Strain: Low Risk  (10/12/2021)   Overall Financial Resource Strain (CARDIA)    Difficulty of Paying Living Expenses: Not hard at all  Food Insecurity: No Food Insecurity (10/12/2021)   Hunger Vital Sign    Worried About Running Out of Food in the Last Year: Never true    Ran Out of Food in the Last Year: Never true  Transportation Needs: No Transportation Needs (10/12/2021)   PRAPARE - Administrator, Civil Service (Medical): No    Lack of Transportation (Non-Medical): No  Physical Activity: Patient Declined (10/12/2021)   Exercise Vital Sign    Days of Exercise per Week: Patient declined    Minutes of Exercise per Session: Patient declined  Stress: No Stress Concern Present (10/12/2021)   [employer] of Occupational Health - Occupational Stress Questionnaire    Feeling of Stress : Not at all  Social Connections: Socially Integrated (10/12/2021)   Social Connection and Isolation Panel [NHANES]    Frequency of Communication with Friends and Family: More than three times a week    Frequency  of Social Gatherings with Friends and Family: More than three times a week    Attends Religious Services: More than 4 times per year    Active Member of [employer] or Organizations: Yes    Attends Engineer, structural: More than 4 times per year    Marital Status: Married  Catering manager Violence: Not At Risk (10/12/2021)   Humiliation, Afraid, Rape, and Kick questionnaire    Fear of Current or Ex-Partner: No    Emotionally Abused: No    Physically Abused: No    Sexually Abused: No    Past Surgical History:  Procedure Laterality Date   CORONARY CALCIUM SCORE / CARDIAC CT ANGIOGRAM  11/16/2019   Cor Ca++ Score ZERO. NO evidence of CAD. Dominant RCA-<PDA & PLVB with minimal (<25%) calcified plaque. LM no plaque -< LAD & LCx. LAD w/ large D1 - no plaque. Non-dom LCx with 1 Large OM1 - no plaque. Normal PV drainage. Normal  LAA & PA size.    FOOT SURGERY     bilateral foot   NM MYOVIEW LTD  07/2007   Normal nuclear stress test.  Mild fixed defect in the anterior wall likely related to soft tissue attenuation.  Normal EF.  Fair exercise capacity.   PROSTATE BIOPSY  06/13/2012   right lat mid gleason 3+3=6   PROSTATECTOMY  2016   TOTAL HIP ARTHROPLASTY  2008 &2007   Right and left   TRANSTHORACIC ECHOCARDIOGRAM  09/2017   EF 55-60%. ??  Normal diastolic function.  No R WMA.  Moderate pulmonary hypertension-PA P estimated 59 mmHg.   TRANSTHORACIC ECHOCARDIOGRAM  11/13/2019    EF 55-60%. NO RWMA. Mild LV dilation & Gr 1 DD. MIld LA dilation. Normal valves. Normal CVP.    Family History  Problem Relation Age of Onset   Hypertension Mother    Colon cancer Mother    Obesity Mother    Prostate cancer Father 56       alive now age 70   Asthma Father    Heart disease Father        Pt is not sure of details (no MI)   Alcoholism Father    Obesity Father    Tuberculosis Brother    Lung cancer Maternal Grandmother        non-smoker    Allergies  Allergen Reactions   Codeine     REACTION: emesis   Cozaar [Losartan Potassium] Hives   Norvasc [Amlodipine] Swelling    Lower extremity edema     Current Outpatient Medications on File Prior to Visit  Medication Sig Dispense Refill   atenolol (TENORMIN) 50 MG tablet Take 1 tablet (50 mg total) by mouth daily. 90 tablet 0   hydrochlorothiazide (MICROZIDE) 12.5 MG capsule Take 1 capsule (12.5 mg total) by mouth daily. 90 capsule 0   methocarbamol (ROBAXIN) 500 MG tablet Take 1 tablet (500 mg total) by mouth 2 (two) times daily. 20 tablet 0   mirabegron ER (MYRBETRIQ) 50 MG TB24 tablet Take 1 tablet (50 mg total) by mouth daily. 30 tablet 0   ondansetron (ZOFRAN) 4 MG tablet Take 1 tablet (4 mg total) by mouth every 8 (eight) hours as needed for nausea or vomiting. 20 tablet 3   potassium chloride SA (KLOR-CON M) 20 MEQ tablet Take 1 tablet (20 mEq total) by mouth  daily. 90 tablet 3   Semaglutide,0.25 or 0.5MG /DOS, 2 MG/3ML SOPN Inject 0.25 mg into the skin once a week. 6 mL 0  tadalafil (CIALIS) 20 MG tablet Take 1 tablet (20 mg total) by mouth daily as needed for erectile dysfunction. 30 tablet 0   traMADol (ULTRAM) 50 MG tablet Take 1 tablet (50 mg total) by mouth every 6 (six) hours as needed. 15 tablet 0   No current facility-administered medications on file prior to visit.    BP (!) 188/110   Pulse (!) 52   Temp 98.2 F (36.8 C) (Oral)   Ht 5\' 11"  (1.803 m)   Wt (!) 308 lb (139.7 kg)   SpO2 95%   BMI 42.96 kg/m       Objective:   Physical Exam Vitals and nursing note reviewed.  Constitutional:      Appearance: Normal appearance.  HENT:     Nose: No congestion or rhinorrhea.     Right Turbinates: Not enlarged or swollen.     Left Turbinates: Not enlarged.     Mouth/Throat:     Pharynx: Oropharyngeal exudate (clear PND) present.     Tonsils: No tonsillar exudate or tonsillar abscesses.  Cardiovascular:     Rate and Rhythm: Normal rate and regular rhythm.     Pulses: Normal pulses.     Heart sounds: Normal heart sounds.  Pulmonary:     Effort: Pulmonary effort is normal.     Breath sounds: Normal breath sounds.  Musculoskeletal:        General: Normal range of motion.  Skin:    General: Skin is warm and dry.  Neurological:     General: No focal deficit present.     Mental Status: He is alert and oriented to person, place, and time.  Psychiatric:        Mood and Affect: Mood normal.        Behavior: Behavior normal.        Thought Content: Thought content normal.        Judgment: Judgment normal.       Assessment & Plan:   1. Class 2 severe obesity due to excess calories with serious comorbidity and body mass index (BMI) of 39.0 to 39.9 in adult (HCC) - Will increase Ozempic to 0.5 mg weekly.  - Follow up in 6 weeks  - Semaglutide,0.25 or 0.5MG /DOS, 2 MG/3ML SOPN; Inject 0.5 mg into the skin once a week.  Dispense:  3 mL; Refill: 0  2. Prediabetes  - Semaglutide,0.25 or 0.5MG /DOS, 2 MG/3ML SOPN; Inject 0.5 mg into the skin once a week.  Dispense: 3 mL; Refill: 0  3. Sore throat - POC strep negaitve  - Seems to be seasonal allergies  - Continue with claritin and will add flonase  - Follow up if not improving in the next week or sooner if symptoms get worse  - fluticasone (FLONASE) 50 MCG/ACT nasal spray; Place 2 sprays into both nostrils daily.  Dispense: 16 g; Refill: 0 - POC Rapid Strep A  4. Acute cough - appears to be from PND - fluticasone (FLONASE) 50 MCG/ACT nasal spray; Place 2 sprays into both nostrils daily.  Dispense: 16 g; Refill: 0  5. Essential hypertension -  Elevated today, possibly from OTC cold medications  - Continue to monitor    Shirline Frees, NP

## 2022-09-24 NOTE — Patient Instructions (Addendum)
Your strep test was negative   I want you to continue Claritin and stop all the other cold medications. Start using Flonase   I have also increase your Ozempic to 0.5 mg until I see you in July

## 2022-09-29 ENCOUNTER — Telehealth: Payer: Self-pay | Admitting: Adult Health

## 2022-09-29 NOTE — Telephone Encounter (Signed)
Pt is taking claritin and NS and would like cough medication benzonatate to help with his cough. Pt seen cory on 09-24-2022 [facility] [address] [place], [place] - [address] Phone: [phone]  Fax: [phone]

## 2022-09-30 ENCOUNTER — Other Ambulatory Visit: Payer: Self-pay | Admitting: Adult Health

## 2022-09-30 MED ORDER — BENZONATATE 200 MG PO CAPS: 200.0000 mg | ORAL_CAPSULE | Freq: Three times a day (TID) | ORAL | 1 refills | Status: DC | PRN

## 2022-09-30 NOTE — Telephone Encounter (Signed)
Pt called, returning CMA's call. CMA was with a patient Pt asked that CMA call back at her earliest convenience. 

## 2022-09-30 NOTE — Telephone Encounter (Signed)
Left message to return phone call.

## 2022-09-30 NOTE — Telephone Encounter (Signed)
Please advise Pt was seen 4 days ago

## 2022-09-30 NOTE — Telephone Encounter (Signed)
Called pt no answer. If pt returns call please advised of medication that has been called in for cough.

## 2022-10-01 ENCOUNTER — Other Ambulatory Visit: Payer: Self-pay | Admitting: Adult Health

## 2022-10-01 NOTE — Telephone Encounter (Signed)
Called pt again no answer. Closing note. 

## 2022-10-01 NOTE — Telephone Encounter (Signed)
Patient notified of update  and verbalized understanding. 

## 2022-10-01 NOTE — Telephone Encounter (Signed)
Tessalon medication is not covered by insurance.

## 2022-10-11 ENCOUNTER — Encounter: Payer: Self-pay | Admitting: Family Medicine

## 2022-10-11 ENCOUNTER — Ambulatory Visit (INDEPENDENT_AMBULATORY_CARE_PROVIDER_SITE_OTHER): Payer: Medicare Other | Admitting: Family Medicine

## 2022-10-11 VITALS — BP 160/90 | HR 53 | Temp 98.3°F | Ht 71.0 in | Wt 308.4 lb

## 2022-10-11 DIAGNOSIS — J019 Acute sinusitis, unspecified: Secondary | ICD-10-CM

## 2022-10-11 DIAGNOSIS — I10 Essential (primary) hypertension: Secondary | ICD-10-CM | POA: Diagnosis not present

## 2022-10-11 DIAGNOSIS — G4733 Obstructive sleep apnea (adult) (pediatric): Secondary | ICD-10-CM | POA: Diagnosis not present

## 2022-10-11 MED ORDER — AMOXICILLIN-POT CLAVULANATE 875-125 MG PO TABS: 1.0000 | ORAL_TABLET | Freq: Two times a day (BID) | ORAL | 0 refills | Status: DC

## 2022-10-11 NOTE — Progress Notes (Signed)
Established Patient Office Visit  Subjective   Patient ID: Jonathan [name], male    DOB: [birth year]  Age: 67 y.o. MRN: [id number]  Chief Complaint  Patient presents with   Cough    Patient complains of cough, x2 weeks, Productive with yellow sputum    Sore Throat    Patient complains of sore throat, x2 weeks, Tried Flonase and Benzonatate   Fatigue   Headache    Patient complains of headaches,     HPI   Jonathan [name] is seen with some persistent productive cough, sore throat, fatigue, headaches for well over 2 weeks now.  He was seen by primary on the 14th and was felt he probably had some allergy issues.  He is tried over-the-counter Flonase and plain Mucinex without much improvement.  He had some sweats 1 night but no documented fever.  He is worried about a "sinus infection".  He has hypertension treated with atenolol and HCTZ 12.5 mg daily.  Home blood pressures have been consistently high since he has been sick.  He states that 3 weeks ago and beyond his blood pressures were well-controlled in the 120s systolic but up since then.  He denies any decongestant use.  Past Medical History:  Diagnosis Date   Anxiety    Asthma    BPH (benign prostatic hyperplasia)    ED (erectile dysfunction)    Elevated PSA    9.33   Esophageal reflux    Hearing loss    History of hip replacement    History of knee replacement    Hypertension    Lactose intolerance    Numbness in both legs    OSA on CPAP 2019   Pre-diabetes    Prostate cancer [facility]) Jan 2016   prostatectomy   Vitamin D deficiency    Past Surgical History:  Procedure Laterality Date   CORONARY CALCIUM SCORE / CARDIAC CT ANGIOGRAM  11/16/2019   Cor Ca++ Score ZERO. NO evidence of CAD. Dominant RCA-<PDA & PLVB with minimal (<25%) calcified plaque. LM no plaque -< LAD & LCx. LAD w/ large D1 - no plaque. Non-dom LCx with 1 Large OM1 - no plaque. Normal PV drainage. Normal LAA & PA size.    FOOT SURGERY     bilateral foot    NM MYOVIEW LTD  07/2007   Normal nuclear stress test.  Mild fixed defect in the anterior wall likely related to soft tissue attenuation.  Normal EF.  Fair exercise capacity.   PROSTATE BIOPSY  06/13/2012   right lat mid gleason 3+3=6   PROSTATECTOMY  2016   TOTAL HIP ARTHROPLASTY  2008 &2007   Right and left   TRANSTHORACIC ECHOCARDIOGRAM  09/2017   EF 55-60%. ??  Normal diastolic function.  No R WMA.  Moderate pulmonary hypertension-PA P estimated 59 mmHg.   TRANSTHORACIC ECHOCARDIOGRAM  11/13/2019    EF 55-60%. NO RWMA. Mild LV dilation & Gr 1 DD. MIld LA dilation. Normal valves. Normal CVP.    reports that he has never smoked. He has never been exposed to tobacco smoke. He has never used smokeless tobacco. He reports that he does not drink alcohol and does not use drugs. family history includes Alcoholism in his father; Asthma in his father; Colon cancer in his mother; Heart disease in his father; Hypertension in his mother; Lung cancer in his maternal grandmother; Obesity in his father and mother; Prostate cancer (age of onset: 60) in his father; Tuberculosis in his brother. Allergies  Allergen Reactions   Codeine     REACTION: emesis   Cozaar [Losartan Potassium] Hives   Norvasc [Amlodipine] Swelling    Lower extremity edema     Review of Systems  Constitutional:  Positive for malaise/fatigue. Negative for chills and fever.  HENT:  Positive for congestion and sore throat.   Respiratory:  Positive for cough and sputum production.       Objective:     BP (!) 160/90 (BP Location: Left Arm, Patient Position: Sitting, Cuff Size: Large)   Pulse (!) 53   Temp 98.3 F (36.8 C) (Oral)   Ht 5\' 11"  (1.803 m)   Wt (!) 308 lb 6.4 oz (139.9 kg)   SpO2 98%   BMI 43.01 kg/m  BP Readings from Last 3 Encounters:  10/11/22 (!) 160/90  09/24/22 (!) 146/90  08/10/22 130/88   Wt Readings from Last 3 Encounters:  10/11/22 (!) 308 lb 6.4 oz (139.9 kg)  09/24/22 (!) 308 lb (139.7 kg)   08/10/22 (!) 310 lb (140.6 kg)      Physical Exam Vitals reviewed.  Constitutional:      General: He is not in acute distress.    Appearance: He is not ill-appearing.  HENT:     Ears:     Comments: Cerumen impaction right canal.  Left TM is normal    Mouth/Throat:     Mouth: Mucous membranes are moist.     Pharynx: No oropharyngeal exudate or posterior oropharyngeal erythema.  Cardiovascular:     Rate and Rhythm: Normal rate and regular rhythm.  Pulmonary:     Effort: Pulmonary effort is normal.     Breath sounds: Normal breath sounds. No wheezing or rales.  Musculoskeletal:     Cervical back: Neck supple.  Lymphadenopathy:     Cervical: No cervical adenopathy.  Neurological:     Mental Status: He is alert.      No results found for any visits on 10/11/22.    The 10-year ASCVD risk score (Arnett DK, et al., 2019) is: 24%    Assessment & Plan:   #1 probable acute sinusitis.  He describes about 3-week history of sore throat, headache, malaise, productive cough and thick yellowish nasal discharge.  Given duration of symptoms start Augmentin 875 mg twice daily with food for 10 days.  Follow-up for any persistent or worsening symptoms  #2 hypertension poorly controlled today.  Handout on DASH diet given.  We strongly suggest he consider going on additional medication.  He had intolerance with both Losartan and amlodipine in the past.  His issue with amlodipine was edema.  We did discuss other potential options such as Cardizem which may be somewhat less likely to cause edema issues.  He declines at this time.  Schedule follow-up with primary end of month.  Evelena Peat, MD

## 2022-10-15 ENCOUNTER — Ambulatory Visit (INDEPENDENT_AMBULATORY_CARE_PROVIDER_SITE_OTHER): Payer: Medicare Other

## 2022-10-15 VITALS — Ht 71.0 in | Wt 310.0 lb

## 2022-10-15 DIAGNOSIS — Z0000 Encounter for general adult medical examination without abnormal findings: Secondary | ICD-10-CM

## 2022-10-15 NOTE — Patient Instructions (Addendum)
Jonathan [name] , Thank you for taking time to come for your Medicare Wellness Visit. I appreciate your ongoing commitment to your health goals. Please review the following plan we discussed and let me know if I can assist you in the future.   These are the goals we discussed:  Goals       Develop a Weight Loss Readiness Plan       Patient seen health and wellness with Dr. Manson Passey for weight loss,  pt goal to lose 50lbs      Increase physical activity (pt-stated)      Patient Stated      10/15/2022, wants to lose weight        This is a list of the screening recommended for you and due dates:  Health Maintenance  Topic Date Due   DTaP/Tdap/Td vaccine (3 - Tdap) 11/16/2018   Colon Cancer Screening  10/28/2020   Pneumonia Vaccine (1 of 1 - PCV) Never done   Flu Shot  11/11/2022   Medicare Annual Wellness Visit  10/15/2023   Hepatitis C Screening  Completed   HIV Screening  Completed   Zoster (Shingles) Vaccine  Completed   HPV Vaccine  Aged Out   COVID-19 Vaccine  Discontinued    Advanced directives: Advance directive discussed with you today.   Conditions/risks identified: none  Next appointment: Follow up in one year for your annual wellness visit.   Preventive Care 72 Years and Older, Male  Preventive care refers to lifestyle choices and visits with your health care provider that can promote health and wellness. What does preventive care include? A yearly physical exam. This is also called an annual well check. Dental exams once or twice a year. Routine eye exams. Ask your health care provider how often you should have your eyes checked. Personal lifestyle choices, including: Daily care of your teeth and gums. Regular physical activity. Eating a healthy diet. Avoiding tobacco and drug use. Limiting alcohol use. Practicing safe sex. Taking low doses of aspirin every day. Taking vitamin and mineral supplements as recommended by your health care provider. What happens  during an annual well check? The services and screenings done by your health care provider during your annual well check will depend on your age, overall health, lifestyle risk factors, and family history of disease. Counseling  Your health care provider may ask you questions about your: Alcohol use. Tobacco use. Drug use. Emotional well-being. Home and relationship well-being. Sexual activity. Eating habits. History of falls. Memory and ability to understand (cognition). Work and work Astronomer. Screening  You may have the following tests or measurements: Height, weight, and BMI. Blood pressure. Lipid and cholesterol levels. These may be checked every 5 years, or more frequently if you are over 47 years old. Skin check. Lung cancer screening. You may have this screening every year starting at age 70 if you have a 30-pack-year history of smoking and currently smoke or have quit within the past 15 years. Fecal occult blood test (FOBT) of the stool. You may have this test every year starting at age 61. Flexible sigmoidoscopy or colonoscopy. You may have a sigmoidoscopy every 5 years or a colonoscopy every 10 years starting at age 61. Prostate cancer screening. Recommendations will vary depending on your family history and other risks. Hepatitis C blood test. Hepatitis B blood test. Sexually transmitted disease (STD) testing. Diabetes screening. This is done by checking your blood sugar (glucose) after you have not eaten for a while (fasting). You  may have this done every 1-3 years. Abdominal aortic aneurysm (AAA) screening. You may need this if you are a current or former smoker. Osteoporosis. You may be screened starting at age 99 if you are at high risk. Talk with your health care provider about your test results, treatment options, and if necessary, the need for more tests. Vaccines  Your health care provider may recommend certain vaccines, such as: Influenza vaccine. This is  recommended every year. Tetanus, diphtheria, and acellular pertussis (Tdap, Td) vaccine. You may need a Td booster every 10 years. Zoster vaccine. You may need this after age 78. Pneumococcal 13-valent conjugate (PCV13) vaccine. One dose is recommended after age 22. Pneumococcal polysaccharide (PPSV23) vaccine. One dose is recommended after age 80. Talk to your health care provider about which screenings and vaccines you need and how often you need them. This information is not intended to replace advice given to you by your health care provider. Make sure you discuss any questions you have with your health care provider. Document Released: 04/25/2015 Document Revised: 12/17/2015 Document Reviewed: 01/28/2015 Elsevier Interactive Patient Education  2017 [employer].  Fall Prevention in the Home Falls can cause injuries. They can happen to people of all ages. There are many things you can do to make your home safe and to help prevent falls. What can I do on the outside of my home? Regularly fix the edges of walkways and driveways and fix any cracks. Remove anything that might make you trip as you walk through a door, such as a raised step or threshold. Trim any bushes or trees on the path to your home. Use bright outdoor lighting. Clear any walking paths of anything that might make someone trip, such as rocks or tools. Regularly check to see if handrails are loose or broken. Make sure that both sides of any steps have handrails. Any raised decks and porches should have guardrails on the edges. Have any leaves, snow, or ice cleared regularly. Use sand or salt on walking paths during winter. Clean up any spills in your garage right away. This includes oil or grease spills. What can I do in the bathroom? Use night lights. Install grab bars by the toilet and in the tub and shower. Do not use towel bars as grab bars. Use non-skid mats or decals in the tub or shower. If you need to sit down in  the shower, use a plastic, non-slip stool. Keep the floor dry. Clean up any water that spills on the floor as soon as it happens. Remove soap buildup in the tub or shower regularly. Attach bath mats securely with double-sided non-slip rug tape. Do not have throw rugs and other things on the floor that can make you trip. What can I do in the bedroom? Use night lights. Make sure that you have a light by your bed that is easy to reach. Do not use any sheets or blankets that are too big for your bed. They should not hang down onto the floor. Have a firm chair that has side arms. You can use this for support while you get dressed. Do not have throw rugs and other things on the floor that can make you trip. What can I do in the kitchen? Clean up any spills right away. Avoid walking on wet floors. Keep items that you use a lot in easy-to-reach places. If you need to reach something above you, use a strong step stool that has a grab bar. Keep electrical  cords out of the way. Do not use floor polish or wax that makes floors slippery. If you must use wax, use non-skid floor wax. Do not have throw rugs and other things on the floor that can make you trip. What can I do with my stairs? Do not leave any items on the stairs. Make sure that there are handrails on both sides of the stairs and use them. Fix handrails that are broken or loose. Make sure that handrails are as long as the stairways. Check any carpeting to make sure that it is firmly attached to the stairs. Fix any carpet that is loose or worn. Avoid having throw rugs at the top or bottom of the stairs. If you do have throw rugs, attach them to the floor with carpet tape. Make sure that you have a light switch at the top of the stairs and the bottom of the stairs. If you do not have them, ask someone to add them for you. What else can I do to help prevent falls? Wear shoes that: Do not have high heels. Have rubber bottoms. Are comfortable  and fit you well. Are closed at the toe. Do not wear sandals. If you use a stepladder: Make sure that it is fully opened. Do not climb a closed stepladder. Make sure that both sides of the stepladder are locked into place. Ask someone to hold it for you, if possible. Clearly mark and make sure that you can see: Any grab bars or handrails. First and last steps. Where the edge of each step is. Use tools that help you move around (mobility aids) if they are needed. These include: Canes. Walkers. Scooters. Crutches. Turn on the lights when you go into a dark area. Replace any light bulbs as soon as they burn out. Set up your furniture so you have a clear path. Avoid moving your furniture around. If any of your floors are uneven, fix them. If there are any pets around you, be aware of where they are. Review your medicines with your doctor. Some medicines can make you feel dizzy. This can increase your chance of falling. Ask your doctor what other things that you can do to help prevent falls. This information is not intended to replace advice given to you by your health care provider. Make sure you discuss any questions you have with your health care provider. Document Released: 01/23/2009 Document Revised: 09/04/2015 Document Reviewed: 05/03/2014 Elsevier Interactive Patient Education  2017 [employer].

## 2022-10-15 NOTE — Progress Notes (Signed)
Subjective:   Jonathan [name] is a 67 y.o. male who presents for Medicare Annual/Subsequent preventive examination.  Visit Complete: Virtual  I connected with  Jonathan [name] on 10/15/22 by a audio enabled telemedicine application and verified that I am speaking with the correct person using two identifiers.  Patient Location: Home  Provider Location: Office/Clinic  I discussed the limitations of evaluation and management by telemedicine. The patient expressed understanding and agreed to proceed.  e.  Review of Systems     Cardiac Risk Factors include: advanced age (>66men, >69 women);dyslipidemia;hypertension;male gender     Objective:    Today's Vitals   10/15/22 1056  Weight: (!) 310 lb (140.6 kg)  Height: 5\' 11"  (1.803 m)  PainSc: 5    Body mass index is 43.24 kg/m.     10/15/2022   11:03 AM 05/24/2022   11:25 AM 10/12/2021   11:41 AM 09/14/2021   12:56 PM 01/05/2021   11:12 AM 07/14/2020    8:34 AM 07/20/2019    7:03 PM  Advanced Directives  Does Patient Have a Medical Advance Directive? Yes Yes Yes No Yes Yes No  Type of Estate agent of [place];Living will  Healthcare Power of [place];Living will  Healthcare Power of [place];Living will Living will   Does patient want to make changes to medical advance directive?  No - Patient declined No - Patient declined      Copy of Healthcare Power of Attorney in Chart? No - copy requested  No - copy requested      Would patient like information on creating a medical advance directive?    No - Patient declined   No - Patient declined    Current Medications (verified) Outpatient Encounter Medications as of 10/15/2022  Medication Sig   amoxicillin-clavulanate (AUGMENTIN) 875-125 MG tablet Take 1 tablet by mouth 2 (two) times daily.   atenolol (TENORMIN) 50 MG tablet Take 1 tablet (50 mg total) by mouth daily.   fluticasone (FLONASE) 50 MCG/ACT nasal spray Place 2 sprays into both nostrils daily.    hydrochlorothiazide (MICROZIDE) 12.5 MG capsule Take 1 capsule (12.5 mg total) by mouth daily.   potassium chloride SA (KLOR-CON M) 20 MEQ tablet Take 1 tablet (20 mEq total) by mouth daily.   Semaglutide,0.25 or 0.5MG /DOS, 2 MG/3ML SOPN Inject 0.5 mg into the skin once a week.   No facility-administered encounter medications on file as of 10/15/2022.    Allergies (verified) Codeine, Cozaar [losartan potassium], and Norvasc [amlodipine]   History: Past Medical History:  Diagnosis Date   Anxiety    Asthma    BPH (benign prostatic hyperplasia)    ED (erectile dysfunction)    Elevated PSA    9.33   Esophageal reflux    Hearing loss    History of hip replacement    History of knee replacement    Hypertension    Lactose intolerance    Numbness in both legs    OSA on CPAP 2019   Pre-diabetes    Prostate cancer [facility]) Jan 2016   prostatectomy   Vitamin D deficiency    Past Surgical History:  Procedure Laterality Date   CORONARY CALCIUM SCORE / CARDIAC CT ANGIOGRAM  11/16/2019   Cor Ca++ Score ZERO. NO evidence of CAD. Dominant RCA-<PDA & PLVB with minimal (<25%) calcified plaque. LM no plaque -< LAD & LCx. LAD w/ large D1 - no plaque. Non-dom LCx with 1 Large OM1 - no plaque. Normal PV drainage. Normal  LAA & PA size.    FOOT SURGERY     bilateral foot   NM MYOVIEW LTD  07/2007   Normal nuclear stress test.  Mild fixed defect in the anterior wall likely related to soft tissue attenuation.  Normal EF.  Fair exercise capacity.   PROSTATE BIOPSY  06/13/2012   right lat mid gleason 3+3=6   PROSTATECTOMY  2016   TOTAL HIP ARTHROPLASTY  2008 &2007   Right and left   TRANSTHORACIC ECHOCARDIOGRAM  09/2017   EF 55-60%. ??  Normal diastolic function.  No R WMA.  Moderate pulmonary hypertension-PA P estimated 59 mmHg.   TRANSTHORACIC ECHOCARDIOGRAM  11/13/2019    EF 55-60%. NO RWMA. Mild LV dilation & Gr 1 DD. MIld LA dilation. Normal valves. Normal CVP.   Family History  Problem Relation  Age of Onset   Hypertension Mother    Colon cancer Mother    Obesity Mother    Prostate cancer Father 8       alive now age 66   Asthma Father    Heart disease Father        Pt is not sure of details (no MI)   Alcoholism Father    Obesity Father    Tuberculosis Brother    Lung cancer Maternal Grandmother        non-smoker   Social History   Socioeconomic History   Marital status: Married    Spouse name: Not on file   Number of children: 2   Years of education: Not on file   Highest education level: Not on file  Occupational History   Occupation: Air traffic controller: [employer]  Tobacco Use   Smoking status: Never    Passive exposure: Never   Smokeless tobacco: Never  Vaping Use   Vaping Use: Never used  Substance and Sexual Activity   Alcohol use: No   Drug use: No   Sexual activity: Yes  Other Topics Concern   Not on file  Social History Narrative   Not on file   Social Determinants of Health   Financial Resource Strain: Low Risk  (10/15/2022)   Overall Financial Resource Strain (CARDIA)    Difficulty of Paying Living Expenses: Not hard at all  Food Insecurity: No Food Insecurity (10/15/2022)   Hunger Vital Sign    Worried About Running Out of Food in the Last Year: Never true    Ran Out of Food in the Last Year: Never true  Transportation Needs: No Transportation Needs (10/15/2022)   PRAPARE - Administrator, Civil Service (Medical): No    Lack of Transportation (Non-Medical): No  Physical Activity: Inactive (10/15/2022)   Exercise Vital Sign    Days of Exercise per Week: 0 days    Minutes of Exercise per Session: 0 min  Stress: No Stress Concern Present (10/15/2022)   [employer] of Occupational Health - Occupational Stress Questionnaire    Feeling of Stress : Not at all  Social Connections: Socially Integrated (10/15/2022)   Social Connection and Isolation Panel [NHANES]    Frequency of Communication with Friends and Family: Never    Frequency of  Social Gatherings with Friends and Family: More than three times a week    Attends Religious Services: More than 4 times per year    Active Member of [employer] or Organizations: Yes    Attends Engineer, structural: More than 4 times per year    Marital Status: Married  Tobacco Counseling Counseling given: Not Answered   Clinical Intake:  Pre-visit preparation completed: Yes  Pain : 0-10 Pain Score: 5  Pain Type: Chronic pain Pain Location: Knee Pain Orientation: Right Pain Descriptors / Indicators: Aching Pain Onset: More than a month ago Pain Frequency: Constant     Nutritional Status: BMI > 30  Obese Nutritional Risks: None Diabetes: No  How often do you need to have someone help you when you read instructions, pamphlets, or other written materials from your doctor or pharmacy?: 1 - Never  Interpreter Needed?: No  Information entered by :: NAllen LPN   Activities of Daily Living    10/15/2022   10:58 AM  In your present state of health, do you have any difficulty performing the following activities:  Hearing? 0  Vision? 0  Difficulty concentrating or making decisions? 0  Walking or climbing stairs? 1  Comment due to knee  Dressing or bathing? 0  Doing errands, shopping? 0  Preparing Food and eating ? N  Using the Toilet? N  In the past six months, have you accidently leaked urine? Y  Comment had prostate cancer  Do you have problems with loss of bowel control? N  Managing your Medications? N  Managing your Finances? N  Housekeeping or managing your Housekeeping? N    Patient Care Team: Shirline Frees, NP as PCP - General (Family Medicine) Shirline Frees, NP as Nurse Practitioner (Family Medicine) [facility] ([facility], [facility] any recent Medical Services you may have received from other than [facility] providers in the past year (date may be approximate).     Assessment:   This is a routine wellness  examination for [place].  Hearing/Vision screen Hearing Screening - Comments:: Denies hearing issues Vision Screening - Comments:: Regular eye exams, [facility]  Dietary issues and exercise activities discussed:     Goals Addressed             This Visit's Progress    Patient Stated       10/15/2022, wants to lose weight       Depression Screen    10/15/2022   11:04 AM 08/10/2022   10:27 AM 10/12/2021   11:36 AM 07/14/2020    8:35 AM 07/14/2020    8:34 AM 08/28/2018    9:37 AM 04/06/2018    8:35 AM  PHQ 2/9 Scores  PHQ - 2 Score 0 0 0 0 0 5 0  PHQ- 9 Score 6     19   Exception Documentation      Medical reason     Fall Risk    10/15/2022   11:04 AM 10/12/2021   11:40 AM 07/14/2020    8:37 AM 04/06/2018    8:35 AM  Fall Risk   Falls in the past year? 0 0 0 0  Number falls in past yr: 0 0 0   Injury with Fall? 0 0 0   Risk for fall due to : Medication side effect No Fall Risks    Follow up Falls prevention discussed;Falls evaluation completed  Falls evaluation completed     MEDICARE RISK AT HOME:  Medicare Risk at Home - 10/15/22 1104     Any stairs in or around the home? Yes    If so, are there any without handrails? No    Home free of loose throw rugs in walkways, pet beds, electrical cords, etc? Yes    Adequate lighting in your home  to reduce risk of falls? Yes    Life alert? No    Use of a cane, walker or w/c? No    Grab bars in the bathroom? No    Shower chair or bench in shower? No    Elevated toilet seat or a handicapped toilet? Yes             TIMED UP AND GO:  Was the test performed?  No    Cognitive Function:        10/15/2022   11:06 AM 10/12/2021   11:41 AM  6CIT Screen  What Year? 0 points 0 points  What month? 0 points 0 points  What time? 0 points 0 points  Count back from 20 0 points 0 points  Months in reverse 0 points 0 points  Repeat phrase 2 points 0 points  Total Score 2 points 0 points    Immunizations Immunization History   Administered Date(s) Administered   Influenza Split 01/11/2012   Influenza,inj,Quad PF,6+ Mos 01/22/2014   Janssen (J&J) SARS-COV-2 Vaccination 09/05/2019   Td 04/12/1997, 11/15/2008   Zoster Recombinant(Shingrix) 05/09/2019, 07/12/2019    TDAP status: Due, Education has been provided regarding the importance of this vaccine. Advised may receive this vaccine at local pharmacy or Health Dept. Aware to provide a copy of the vaccination record if obtained from local pharmacy or Health Dept. Verbalized acceptance and understanding.  Flu Vaccine status: Up to date  Pneumococcal vaccine status: Due, Education has been provided regarding the importance of this vaccine. Advised may receive this vaccine at local pharmacy or Health Dept. Aware to provide a copy of the vaccination record if obtained from local pharmacy or Health Dept. Verbalized acceptance and understanding.  Covid-19 vaccine status: Information provided on how to obtain vaccines.   Qualifies for Shingles Vaccine? Yes   Zostavax completed Yes   Shingrix Completed?: Yes  Screening Tests Health Maintenance  Topic Date Due   DTaP/Tdap/Td (3 - Tdap) 11/16/2018   Colonoscopy  10/28/2020   Pneumonia Vaccine 56+ Years old (1 of 1 - PCV) Never done   INFLUENZA VACCINE  11/11/2022   Medicare Annual Wellness (AWV)  10/15/2023   Hepatitis C Screening  Completed   HIV Screening  Completed   Zoster Vaccines- Shingrix  Completed   HPV VACCINES  Aged Out   COVID-19 Vaccine  Discontinued    Health Maintenance  Health Maintenance Due  Topic Date Due   DTaP/Tdap/Td (3 - Tdap) 11/16/2018   Colonoscopy  10/28/2020   Pneumonia Vaccine 92+ Years old (1 of 1 - PCV) Never done    Colorectal cancer screening: Type of screening: Colonoscopy. Completed 07/2022. Repeat every 3 years  Lung Cancer Screening: (Low Dose CT Chest recommended if Age 31-80 years, 20 pack-year currently smoking OR have quit w/in 15years.) does not qualify.   Lung  Cancer Screening Referral: no  Additional Screening:  Hepatitis C Screening: does qualify; Completed 04/06/2018  Vision Screening: Recommended annual ophthalmology exams for early detection of glaucoma and other disorders of the eye. Is the patient up to date with their annual eye exam?  Yes  Who is the provider or what is the name of the office in which the patient attends annual eye exams? [facility] If pt is not established with a provider, would they like to be referred to a provider to establish care? No .   Dental Screening: Recommended annual dental exams for proper oral hygiene  Diabetic Foot Exam: n/a  [employer]  Referral / Chronic Care Management: CRR required this visit?  No   CCM required this visit?  No     Plan:     I have personally reviewed and noted the following in the patient's chart:   Medical and social history Use of alcohol, tobacco or illicit drugs  Current medications and supplements including opioid prescriptions. Patient is not currently taking opioid prescriptions. Functional ability and status Nutritional status Physical activity Advanced directives List of other physicians Hospitalizations, surgeries, and ER visits in previous 12 months Vitals Screenings to include cognitive, depression, and falls Referrals and appointments  In addition, I have reviewed and discussed with patient certain preventive protocols, quality metrics, and best practice recommendations. A written personalized care plan for preventive services as well as general preventive health recommendations were provided to patient.     Barb Merino, LPN   04/17/1094   After Visit Summary: (MyChart) Due to this being a telephonic visit, the after visit summary with patients personalized plan was offered to patient via MyChart   Nurse Notes: none

## 2022-10-16 ENCOUNTER — Other Ambulatory Visit: Payer: Self-pay | Admitting: Adult Health

## 2022-10-16 DIAGNOSIS — R051 Acute cough: Secondary | ICD-10-CM

## 2022-10-16 DIAGNOSIS — J029 Acute pharyngitis, unspecified: Secondary | ICD-10-CM

## 2022-11-05 ENCOUNTER — Ambulatory Visit: Payer: Medicare Other | Admitting: Adult Health

## 2022-11-06 ENCOUNTER — Other Ambulatory Visit: Payer: Self-pay | Admitting: Adult Health

## 2022-11-06 DIAGNOSIS — I10 Essential (primary) hypertension: Secondary | ICD-10-CM

## 2022-11-09 ENCOUNTER — Ambulatory Visit (INDEPENDENT_AMBULATORY_CARE_PROVIDER_SITE_OTHER): Payer: Medicare Other | Admitting: Adult Health

## 2022-11-09 ENCOUNTER — Encounter: Payer: Self-pay | Admitting: Adult Health

## 2022-11-09 VITALS — BP 128/86 | HR 53 | Temp 98.6°F | Ht 71.0 in | Wt 304.0 lb

## 2022-11-09 DIAGNOSIS — R300 Dysuria: Secondary | ICD-10-CM

## 2022-11-09 DIAGNOSIS — I10 Essential (primary) hypertension: Secondary | ICD-10-CM | POA: Diagnosis not present

## 2022-11-09 DIAGNOSIS — R7303 Prediabetes: Secondary | ICD-10-CM

## 2022-11-09 DIAGNOSIS — Z6839 Body mass index (BMI) 39.0-39.9, adult: Secondary | ICD-10-CM | POA: Diagnosis not present

## 2022-11-09 DIAGNOSIS — E66812 Obesity, class 2: Secondary | ICD-10-CM

## 2022-11-09 LAB — BASIC METABOLIC PANEL
BUN: 14 mg/dL (ref 6–23)
CO2: 34 mEq/L — ABNORMAL HIGH (ref 19–32)
Calcium: 9.9 mg/dL (ref 8.4–10.5)
Chloride: 96 mEq/L (ref 96–112)
Creatinine, Ser: 0.89 mg/dL (ref 0.40–1.50)
GFR: 89.62 mL/min (ref >60.00)
Glucose, Bld: 91 mg/dL (ref 70–99)
Potassium: 3.4 mEq/L — ABNORMAL LOW (ref 3.5–5.1)
Sodium: 138 mEq/L (ref 135–145)

## 2022-11-09 LAB — URINALYSIS
Bilirubin Urine: NEGATIVE
Hgb urine dipstick: NEGATIVE
Ketones, ur: NEGATIVE
Leukocytes,Ua: NEGATIVE
Nitrite: NEGATIVE
Specific Gravity, Urine: 1.01 (ref 1.000–1.030)
Urine Glucose: NEGATIVE
Urobilinogen, UA: 0.2 (ref 0.0–1.0)
pH: 7.5 (ref 5.0–8.0)

## 2022-11-09 LAB — POCT GLYCOSYLATED HEMOGLOBIN (HGB A1C): Hemoglobin A1C: 5.5 % (ref 4.0–5.6)

## 2022-11-09 MED ORDER — OZEMPIC (1 MG/DOSE) 2 MG/1.5ML ~~LOC~~ SOPN: 1.0000 mg | PEN_INJECTOR | SUBCUTANEOUS | 0 refills | Status: AC

## 2022-11-09 MED ORDER — HYDROCHLOROTHIAZIDE 25 MG PO TABS
25.0000 mg | ORAL_TABLET | Freq: Every day | ORAL | 3 refills | Status: DC
Start: 2022-11-09 — End: 2023-03-24

## 2022-11-09 NOTE — Patient Instructions (Signed)
Health Maintenance Due  Topic Date Due   DTaP/Tdap/Td (3 - Tdap) 11/16/2018   Pneumonia Vaccine 42+ Years old (1 of 1 - PCV) Never done       10/15/2022   11:04 AM 08/10/2022   10:27 AM 10/12/2021   11:36 AM  Depression screen PHQ 2/9  Decreased Interest 0 0 0  Down, Depressed, Hopeless 0 0 0  PHQ - 2 Score 0 0 0  Altered sleeping 3    Tired, decreased energy 3    Change in appetite 0    Feeling bad or failure about yourself  0    Trouble concentrating 0    Moving slowly or fidgety/restless 0    Suicidal thoughts 0    PHQ-9 Score 6    Difficult doing work/chores Not difficult at all

## 2022-11-09 NOTE — Progress Notes (Signed)
Subjective:    Patient ID: Jonathan [name], male    DOB: 01/05/1956, 67 y.o.   MRN: [id number]  HPI 67 year old male who  has a past medical history of Anxiety, Asthma, BPH (benign prostatic hyperplasia), ED (erectile dysfunction), Elevated PSA, Esophageal reflux, Hearing loss, History of hip replacement, History of knee replacement, Hypertension, Lactose intolerance, Numbness in both legs, OSA on CPAP (2019), Pre-diabetes, Prostate cancer [facility]) (Jan 2016), and Vitamin D deficiency.  He presents to the office today for follow up regarding prediabetes/HTN/obesity   Managed with Ozempic 0.5 mg weekly for prediabetes and obesity. He has been tolerating this medication well. He has noticed some decreased appetite. He has been trying to get some exercise in.   Wt Readings from Last 10 Encounters:  11/09/22 (!) 304 lb (137.9 kg)  10/15/22 (!) 310 lb (140.6 kg)  10/11/22 (!) 308 lb 6.4 oz (139.9 kg)  09/24/22 (!) 308 lb (139.7 kg)  08/10/22 (!) 310 lb (140.6 kg)  05/24/22 (!) 308 lb (139.7 kg)  05/07/22 (!) 308 lb (139.7 kg)  02/15/22 (!) 311 lb (141.1 kg)  02/11/22 (!) 301 lb (136.5 kg)  01/07/22 (!) 308 lb 8 oz (139.9 kg)   Essential hypertension - managed with HCTZ 12.5 mg and Atenolol 50 mg daily. He has been monitoring his blood pressure at home with readings in the 160-170's at home periodically..   BP Readings from Last 3 Encounters:  11/09/22 128/86  10/11/22 (!) 160/90  09/24/22 (!) 146/90   Dysuria - reports on and off burning with urination for the last month. Denies hematuria or frequency.   Review of Systems See HPI   Past Medical History:  Diagnosis Date   Anxiety    Asthma    BPH (benign prostatic hyperplasia)    ED (erectile dysfunction)    Elevated PSA    9.33   Esophageal reflux    Hearing loss    History of hip replacement    History of knee replacement    Hypertension    Lactose intolerance    Numbness in both legs    OSA on CPAP 2019   Pre-diabetes     Prostate cancer [facility]) Jan 2016   prostatectomy   Vitamin D deficiency     Social History   Socioeconomic History   Marital status: Married    Spouse name: Not on file   Number of children: 2   Years of education: Not on file   Highest education level: Not on file  Occupational History   Occupation: Air traffic controller: [employer]  Tobacco Use   Smoking status: Never    Passive exposure: Never   Smokeless tobacco: Never  Vaping Use   Vaping status: Never Used  Substance and Sexual Activity   Alcohol use: No   Drug use: No   Sexual activity: Yes  Other Topics Concern   Not on file  Social History Narrative   Not on file   Social Determinants of Health   Financial Resource Strain: Low Risk  (10/15/2022)   Overall Financial Resource Strain (CARDIA)    Difficulty of Paying Living Expenses: Not hard at all  Food Insecurity: No Food Insecurity (10/15/2022)   Hunger Vital Sign    Worried About Running Out of Food in the Last Year: Never true    Ran Out of Food in the Last Year: Never true  Transportation Needs: No Transportation Needs (10/15/2022)   PRAPARE - Transportation  Lack of Transportation (Medical): No    Lack of Transportation (Non-Medical): No  Physical Activity: Inactive (10/15/2022)   Exercise Vital Sign    Days of Exercise per Week: 0 days    Minutes of Exercise per Session: 0 min  Stress: No Stress Concern Present (10/15/2022)   [employer] of Occupational Health - Occupational Stress Questionnaire    Feeling of Stress : Not at all  Social Connections: Socially Integrated (10/15/2022)   Social Connection and Isolation Panel [NHANES]    Frequency of Communication with Friends and Family: Never    Frequency of Social Gatherings with Friends and Family: More than three times a week    Attends Religious Services: More than 4 times per year    Active Member of [employer] or Organizations: Yes    Attends Banker Meetings: More than 4 times per year    Marital  Status: Married  Catering manager Violence: Not At Risk (10/15/2022)   Humiliation, Afraid, Rape, and Kick questionnaire    Fear of Current or Ex-Partner: No    Emotionally Abused: No    Physically Abused: No    Sexually Abused: No    Past Surgical History:  Procedure Laterality Date   CORONARY CALCIUM SCORE / CARDIAC CT ANGIOGRAM  11/16/2019   Cor Ca++ Score ZERO. NO evidence of CAD. Dominant RCA-<PDA & PLVB with minimal (<25%) calcified plaque. LM no plaque -< LAD & LCx. LAD w/ large D1 - no plaque. Non-dom LCx with 1 Large OM1 - no plaque. Normal PV drainage. Normal LAA & PA size.    FOOT SURGERY     bilateral foot   NM MYOVIEW LTD  07/2007   Normal nuclear stress test.  Mild fixed defect in the anterior wall likely related to soft tissue attenuation.  Normal EF.  Fair exercise capacity.   PROSTATE BIOPSY  06/13/2012   right lat mid gleason 3+3=6   PROSTATECTOMY  2016   TOTAL HIP ARTHROPLASTY  2008 &2007   Right and left   TRANSTHORACIC ECHOCARDIOGRAM  09/2017   EF 55-60%. ??  Normal diastolic function.  No R WMA.  Moderate pulmonary hypertension-PA P estimated 59 mmHg.   TRANSTHORACIC ECHOCARDIOGRAM  11/13/2019    EF 55-60%. NO RWMA. Mild LV dilation & Gr 1 DD. MIld LA dilation. Normal valves. Normal CVP.    Family History  Problem Relation Age of Onset   Hypertension Mother    Colon cancer Mother    Obesity Mother    Prostate cancer Father 52       alive now age 77   Asthma Father    Heart disease Father        Pt is not sure of details (no MI)   Alcoholism Father    Obesity Father    Tuberculosis Brother    Lung cancer Maternal Grandmother        non-smoker    Allergies  Allergen Reactions   Codeine     REACTION: emesis   Cozaar [Losartan Potassium] Hives   Norvasc [Amlodipine] Swelling    Lower extremity edema     Current Outpatient Medications on File Prior to Visit  Medication Sig Dispense Refill   atenolol (TENORMIN) 50 MG tablet TAKE 1 TABLET BY MOUTH  EVERY DAY 90 tablet 0   fluticasone (FLONASE) 50 MCG/ACT nasal spray SPRAY 2 SPRAYS INTO EACH NOSTRIL EVERY DAY 48 mL 1   hydrochlorothiazide (MICROZIDE) 12.5 MG capsule TAKE 1 CAPSULE BY MOUTH EVERY DAY 90 capsule  0   potassium chloride SA (KLOR-CON M) 20 MEQ tablet Take 1 tablet (20 mEq total) by mouth daily. 90 tablet 3   No current facility-administered medications on file prior to visit.    BP 128/86   Pulse (!) 53   Temp 98.6 F (37 C) (Oral)   Ht 5\' 11"  (1.803 m)   Wt (!) 304 lb (137.9 kg)   SpO2 96%   BMI 42.40 kg/m       Objective:   Physical Exam Vitals and nursing note reviewed.  Constitutional:      Appearance: Normal appearance.  Cardiovascular:     Rate and Rhythm: Normal rate and regular rhythm.     Pulses: Normal pulses.     Heart sounds: Normal heart sounds.  Pulmonary:     Effort: Pulmonary effort is normal.     Breath sounds: Normal breath sounds.  Abdominal:     General: Abdomen is flat. Bowel sounds are normal.     Palpations: Abdomen is soft.     Tenderness: There is no right CVA tenderness or left CVA tenderness.  Musculoskeletal:        General: Normal range of motion.  Skin:    General: Skin is warm and dry.  Neurological:     General: No focal deficit present.     Mental Status: He is alert and oriented to person, place, and time.  Psychiatric:        Mood and Affect: Mood normal.        Behavior: Behavior normal.        Thought Content: Thought content normal.        Judgment: Judgment normal.       Assessment & Plan:  1. Class 2 severe obesity due to excess calories with serious comorbidity and body mass index (BMI) of 39.0 to 39.9 in adult (HCC)  - Semaglutide, 1 MG/DOSE, (OZEMPIC, 1 MG/DOSE,) 2 MG/1.5ML SOPN; Inject 1 mg into the skin once a week.  Dispense: 9 mL; Refill: 0  2. Prediabetes  - POC HgB A1c- 5.5  - Will increase Ozempic to 1 mg dose x 3 months.  - Semaglutide, 1 MG/DOSE, (OZEMPIC, 1 MG/DOSE,) 2 MG/1.5ML SOPN;  Inject 1 mg into the skin once a week.  Dispense: 9 mL; Refill: 0 - Basic Metabolic Panel; Future  3. Essential hypertension  - hydrochlorothiazide (HYDRODIURIL) 25 MG tablet; Take 1 tablet (25 mg total) by mouth daily.  Dispense: 90 tablet; Refill: 3  4. Dysuria - Encouraged to stay hydrated  - Basic Metabolic Panel; Future - Urinalysis; Future - Urine Culture; Future   Shirline Frees, NP

## 2022-11-11 DIAGNOSIS — G4733 Obstructive sleep apnea (adult) (pediatric): Secondary | ICD-10-CM | POA: Diagnosis not present

## 2022-11-13 DIAGNOSIS — G4733 Obstructive sleep apnea (adult) (pediatric): Secondary | ICD-10-CM | POA: Diagnosis not present

## 2023-01-06 ENCOUNTER — Encounter: Payer: Self-pay | Admitting: Urology

## 2023-01-06 ENCOUNTER — Ambulatory Visit: Payer: Medicare Other | Admitting: Urology

## 2023-01-06 VITALS — BP 146/94 | HR 51 | Ht 71.0 in | Wt 298.0 lb

## 2023-01-06 DIAGNOSIS — C61 Malignant neoplasm of prostate: Secondary | ICD-10-CM

## 2023-01-06 DIAGNOSIS — R350 Frequency of micturition: Secondary | ICD-10-CM | POA: Diagnosis not present

## 2023-01-06 DIAGNOSIS — N529 Male erectile dysfunction, unspecified: Secondary | ICD-10-CM | POA: Diagnosis not present

## 2023-01-06 DIAGNOSIS — R399 Unspecified symptoms and signs involving the genitourinary system: Secondary | ICD-10-CM

## 2023-01-06 DIAGNOSIS — Z8546 Personal history of malignant neoplasm of prostate: Secondary | ICD-10-CM | POA: Diagnosis not present

## 2023-01-06 DIAGNOSIS — R3915 Urgency of urination: Secondary | ICD-10-CM | POA: Diagnosis not present

## 2023-01-06 MED ORDER — VARDENAFIL HCL 20 MG PO TABS: 20.0000 mg | ORAL_TABLET | Freq: Every day | ORAL | 11 refills | Status: AC | PRN

## 2023-01-06 NOTE — Progress Notes (Signed)
01/06/2023 11:45 AM   Jonathan [name] 1955/08/27 [id number]  Reason for visit: Follow up history of prostate cancer, lower urinary tract symptoms,  HPI: 67 year old male with morbid obesity BMI 42, history of prostate cancer treated with robotic radical prostatectomy in 2016 at [facility] with pathology showing Gleason score 3+4=7 prostate adenocarcinoma with negative nodes and negative margins.  PSA has been undetectable since that time.  He has requested PSA testing every 6 months, and PSA has remained undetectable, most recently <0.1 in March 2024.  Since his surgery he reports ED as well as urinary urgency and frequency.  He has previously tried max dose Cialis and Viagra without any improvement.  He thinks he may have tried Levitra in the past that was helpful and was interested in a trial of that today.  Risk and benefits were discussed.  We also discussed other alternatives like vacuum erection device, penile injections, or penile prosthesis, and he is not interested in more invasive treatments at this time.  He also has urinary frequency during the day and some occasional urgency.  He denies any stress incontinence.  He was previously trialed on Myrbetriq with no significant improvement in his urinary symptoms, but has had some improvement with recent change in blood pressure medications.  I offered him a trial of an anticholinergic OAB medication but he deferred at this time.  He drinks primarily water.  Finally, he was interested in checking testosterone, his primary complaints are fatigue, low energy, weight gain.  We reviewed the AUA guidelines regarding risks and benefits of testosterone evaluation and management.  -Trial of Levitra for ED, okay to refill if helpful -Follow-up for lab visit for morning testosterone and PSA, if testosterone low will schedule follow-up with PA for repeat T/LH and consider replacement options    Sondra Come, MD  [facility] [address] [place], [place] [address] [phone]

## 2023-01-10 ENCOUNTER — Other Ambulatory Visit: Payer: Medicare Other

## 2023-01-10 DIAGNOSIS — N529 Male erectile dysfunction, unspecified: Secondary | ICD-10-CM

## 2023-01-10 DIAGNOSIS — C61 Malignant neoplasm of prostate: Secondary | ICD-10-CM

## 2023-01-10 DIAGNOSIS — R399 Unspecified symptoms and signs involving the genitourinary system: Secondary | ICD-10-CM

## 2023-01-11 ENCOUNTER — Other Ambulatory Visit: Payer: Self-pay

## 2023-01-11 DIAGNOSIS — E291 Testicular hypofunction: Secondary | ICD-10-CM

## 2023-01-11 LAB — TESTOSTERONE: Testosterone: 137 ng/dL — ABNORMAL LOW (ref 264–916)

## 2023-01-11 LAB — PSA: Prostate Specific Ag, Serum: <0.1 ng/mL (ref 0.0–4.0)

## 2023-01-11 NOTE — Progress Notes (Signed)
.  lh

## 2023-01-18 ENCOUNTER — Other Ambulatory Visit: Payer: Medicare Other

## 2023-01-21 DIAGNOSIS — M25562 Pain in left knee: Secondary | ICD-10-CM | POA: Diagnosis not present

## 2023-01-24 ENCOUNTER — Other Ambulatory Visit: Payer: Self-pay | Admitting: Orthopaedic Surgery

## 2023-01-24 DIAGNOSIS — M25562 Pain in left knee: Secondary | ICD-10-CM

## 2023-01-25 ENCOUNTER — Encounter: Payer: Self-pay | Admitting: Physician Assistant

## 2023-01-25 ENCOUNTER — Ambulatory Visit: Payer: Medicare Other | Admitting: Physician Assistant

## 2023-01-25 VITALS — BP 162/100 | HR 48 | Ht 71.0 in | Wt 300.0 lb

## 2023-01-25 DIAGNOSIS — N529 Male erectile dysfunction, unspecified: Secondary | ICD-10-CM

## 2023-01-25 DIAGNOSIS — E291 Testicular hypofunction: Secondary | ICD-10-CM | POA: Diagnosis not present

## 2023-01-25 DIAGNOSIS — Z8546 Personal history of malignant neoplasm of prostate: Secondary | ICD-10-CM

## 2023-01-25 DIAGNOSIS — C61 Malignant neoplasm of prostate: Secondary | ICD-10-CM

## 2023-01-25 NOTE — Progress Notes (Signed)
01/25/2023 9:41 AM   Jonathan [name] 23-Jan-1956 [id number]  CC: Chief Complaint  Patient presents with   Hypogonadism   HPI: Jonathan [name] is a 67 y.o. male with PMH prostate cancer s/p prostatectomy in 2016 with undetectable PSA, ED who previously failed Cialis and Viagra, and fatigue with concern for hypogonadism who presents today for follow-up.   Today he reports he has not yet had a chance to try Levitra as prescribed per Dr. Richardo Hanks.  If he fails this, he would not be interested in pursuing ICI or IPP.  He states he will just learn to live without erections. SHIM 5 as below.  A.m. testosterone dated 01/10/2023 was low at 137.  Repeat a.m. testosterone and LH were drawn this morning and are pending.  He states he was previously on testosterone gel but it did not help.  He does not want to add on any additional long-term medications at this time.  PSA dated 01/10/2023 was undetectable.   SHIM     Row Name 01/25/23 0905         SHIM: Over the last 6 months:   How do you rate your confidence that you could get and keep an erection? Very Low     When you had erections with sexual stimulation, how often were your erections hard enough for penetration (entering your partner)? Almost Never or Never     During sexual intercourse, how often were you able to maintain your erection after you had penetrated (entered) your partner? Almost Never or Never     During sexual intercourse, how difficult was it to maintain your erection to completion of intercourse? Extremely Difficult     When you attempted sexual intercourse, how often was it satisfactory for you? Almost Never or Never       SHIM Total Score   SHIM 5               PMH: Past Medical History:  Diagnosis Date   Anxiety    Asthma    BPH (benign prostatic hyperplasia)    ED (erectile dysfunction)    Elevated PSA    9.33   Esophageal reflux    Hearing loss    History of hip replacement    History of knee  replacement    Hypertension    Lactose intolerance    Numbness in both legs    OSA on CPAP 2019   Pre-diabetes    Prostate cancer [facility]) Jan 2016   prostatectomy   Vitamin D deficiency     Surgical History: Past Surgical History:  Procedure Laterality Date   CORONARY CALCIUM SCORE / CARDIAC CT ANGIOGRAM  11/16/2019   Cor Ca++ Score ZERO. NO evidence of CAD. Dominant RCA-<PDA & PLVB with minimal (<25%) calcified plaque. LM no plaque -< LAD & LCx. LAD w/ large D1 - no plaque. Non-dom LCx with 1 Large OM1 - no plaque. Normal PV drainage. Normal LAA & PA size.    FOOT SURGERY     bilateral foot   NM MYOVIEW LTD  07/2007   Normal nuclear stress test.  Mild fixed defect in the anterior wall likely related to soft tissue attenuation.  Normal EF.  Fair exercise capacity.   PROSTATE BIOPSY  06/13/2012   right lat mid gleason 3+3=6   PROSTATECTOMY  2016   TOTAL HIP ARTHROPLASTY  2008 &2007   Right and left   TRANSTHORACIC ECHOCARDIOGRAM  09/2017   EF 55-60%. ??  Normal diastolic  function.  No R WMA.  Moderate pulmonary hypertension-PA P estimated 59 mmHg.   TRANSTHORACIC ECHOCARDIOGRAM  11/13/2019    EF 55-60%. NO RWMA. Mild LV dilation & Gr 1 DD. MIld LA dilation. Normal valves. Normal CVP.    Home Medications:  Allergies as of 01/25/2023       Reactions   Codeine    REACTION: emesis   Cozaar [losartan Potassium] Hives   Norvasc [amlodipine] Swelling   Lower extremity edema         Medication List        Accurate as of January 25, 2023  9:41 AM. If you have any questions, ask your nurse or doctor.          atenolol 50 MG tablet Commonly known as: TENORMIN TAKE 1 TABLET BY MOUTH EVERY DAY   fluticasone 50 MCG/ACT nasal spray Commonly known as: FLONASE SPRAY 2 SPRAYS INTO EACH NOSTRIL EVERY DAY   hydrochlorothiazide 25 MG tablet Commonly known as: HYDRODIURIL Take 1 tablet (25 mg total) by mouth daily.   potassium chloride SA 20 MEQ tablet Commonly known as:  KLOR-CON M Take 1 tablet (20 mEq total) by mouth daily.   vardenafil 20 MG tablet Commonly known as: LEVITRA Take 1 tablet (20 mg total) by mouth daily as needed for erectile dysfunction (take 45 minues prior to sexual activity).        Allergies:  Allergies  Allergen Reactions   Codeine     REACTION: emesis   Cozaar [Losartan Potassium] Hives   Norvasc [Amlodipine] Swelling    Lower extremity edema     Family History: Family History  Problem Relation Age of Onset   Hypertension Mother    Colon cancer Mother    Obesity Mother    Prostate cancer Father 18       alive now age 34   Asthma Father    Heart disease Father        Pt is not sure of details (no MI)   Alcoholism Father    Obesity Father    Tuberculosis Brother    Lung cancer Maternal Grandmother        non-smoker    Social History:   reports that he has never smoked. He has never been exposed to tobacco smoke. He has never used smokeless tobacco. He reports that he does not drink alcohol and does not use drugs.  Physical Exam: BP (!) 162/100   Pulse (!) 48   Ht 5\' 11"  (1.803 m)   Wt 300 lb (136.1 kg)   BMI 41.84 kg/m   Constitutional:  Alert and oriented, no acute distress, nontoxic appearing HEENT: [place], AT Cardiovascular: No clubbing, cyanosis, or edema Respiratory: Normal respiratory effort, no increased work of breathing Skin: No rashes, bruises or suspicious lesions Neurologic: Grossly intact, no focal deficits, moving all 4 extremities Psychiatric: Normal mood and affect  Laboratory Data: Results for orders placed or performed in visit on 01/10/23  PSA  Result Value Ref Range   Prostate Specific Ag, Serum <0.1 0.0 - 4.0 ng/mL  Testosterone  Result Value Ref Range   Testosterone 137 (L) 264 - 916 ng/dL   Assessment & Plan:   1. Hypogonadism in male He has had 1 low a.m. testosterone.  Repeat a.m. testosterone and LH drawn this morning.  Will call with results.  We discussed various  formulations of TRT including IM injections, gel, pellets, subcu injections, and pills.  We also discussed Clomid, which would increase his body's  natural testosterone production.  We discussed that these would be long-term therapies.  Since he does not wish to pursue additional long-term therapies, he declined TRT.  May revisit in the future if he changes mind. - Luteinizing hormone - Testosterone  2. Erectile dysfunction, unspecified erectile dysfunction type Severe ED per SHIM.  He has not yet tried Levitra.  If he fails this, he does not wish to pursue additional treatments.  Will continue to monitor.  3. Prostate CA (HCC) Most recent PSA was undetectable, will repeat in 6 months. - PSA; Standing   Return for Will call with results + 6 mos lab PSA + 1 year annual visit with Dr. Richardo Hanks with PSA prior.  Carman Ching, PA-C  [facility] [place] [address], [address] [place], [place] [address] [phone]

## 2023-01-26 LAB — TESTOSTERONE: Testosterone: 102 ng/dL — ABNORMAL LOW (ref 264–916)

## 2023-01-26 LAB — LUTEINIZING HORMONE: LH: 8.8 m[IU]/mL — ABNORMAL HIGH (ref 1.7–8.6)

## 2023-01-26 NOTE — Progress Notes (Signed)
His repeat testosterone was low at 102, as anticipated. His LH was slightly high at 8.8. I would like to schedule him for a lab visit for repeat LH with FSH and prolactin to rule out underlying pituitary abnormality. We will call him with his results when available.

## 2023-01-28 ENCOUNTER — Other Ambulatory Visit: Payer: Self-pay

## 2023-01-28 ENCOUNTER — Other Ambulatory Visit: Payer: Medicare Other

## 2023-01-28 DIAGNOSIS — E291 Testicular hypofunction: Secondary | ICD-10-CM

## 2023-01-28 DIAGNOSIS — N529 Male erectile dysfunction, unspecified: Secondary | ICD-10-CM

## 2023-01-28 DIAGNOSIS — C61 Malignant neoplasm of prostate: Secondary | ICD-10-CM

## 2023-01-29 LAB — PROLACTIN: Prolactin: 18.2 ng/mL (ref 3.6–25.2)

## 2023-01-29 LAB — FOLLICLE STIMULATING HORMONE: FSH: 9.8 m[IU]/mL (ref 1.5–12.4)

## 2023-01-29 LAB — LUTEINIZING HORMONE: LH: 9.6 m[IU]/mL — ABNORMAL HIGH (ref 1.7–8.6)

## 2023-02-07 ENCOUNTER — Other Ambulatory Visit: Payer: Self-pay | Admitting: Adult Health

## 2023-02-07 DIAGNOSIS — I10 Essential (primary) hypertension: Secondary | ICD-10-CM

## 2023-02-09 ENCOUNTER — Encounter: Payer: Self-pay | Admitting: Adult Health

## 2023-02-09 ENCOUNTER — Ambulatory Visit (INDEPENDENT_AMBULATORY_CARE_PROVIDER_SITE_OTHER): Payer: Medicare Other | Admitting: Adult Health

## 2023-02-09 VITALS — BP 138/82 | HR 45 | Temp 98.3°F | Ht 71.0 in | Wt 296.0 lb

## 2023-02-09 DIAGNOSIS — G4733 Obstructive sleep apnea (adult) (pediatric): Secondary | ICD-10-CM

## 2023-02-09 DIAGNOSIS — E559 Vitamin D deficiency, unspecified: Secondary | ICD-10-CM | POA: Diagnosis not present

## 2023-02-09 DIAGNOSIS — E66812 Obesity, class 2: Secondary | ICD-10-CM | POA: Diagnosis not present

## 2023-02-09 DIAGNOSIS — I10 Essential (primary) hypertension: Secondary | ICD-10-CM | POA: Diagnosis not present

## 2023-02-09 DIAGNOSIS — C61 Malignant neoplasm of prostate: Secondary | ICD-10-CM

## 2023-02-09 DIAGNOSIS — E7849 Other hyperlipidemia: Secondary | ICD-10-CM

## 2023-02-09 DIAGNOSIS — Z6839 Body mass index (BMI) 39.0-39.9, adult: Secondary | ICD-10-CM

## 2023-02-09 DIAGNOSIS — R7303 Prediabetes: Secondary | ICD-10-CM

## 2023-02-09 DIAGNOSIS — Z0000 Encounter for general adult medical examination without abnormal findings: Secondary | ICD-10-CM

## 2023-02-09 LAB — COMPREHENSIVE METABOLIC PANEL
ALT: 15 U/L (ref 0–53)
AST: 16 U/L (ref 0–37)
Albumin: 4.2 g/dL (ref 3.5–5.2)
Alkaline Phosphatase: 63 U/L (ref 39–117)
BUN: 14 mg/dL (ref 6–23)
CO2: 34 meq/L — ABNORMAL HIGH (ref 19–32)
Calcium: 10.2 mg/dL (ref 8.4–10.5)
Chloride: 95 meq/L — ABNORMAL LOW (ref 96–112)
Creatinine, Ser: 0.9 mg/dL (ref 0.40–1.50)
GFR: 89.16 mL/min (ref >60.00)
Glucose, Bld: 91 mg/dL (ref 70–99)
Potassium: 4 meq/L (ref 3.5–5.1)
Sodium: 137 meq/L (ref 135–145)
Total Bilirubin: 0.8 mg/dL (ref 0.2–1.2)
Total Protein: 8.1 g/dL (ref 6.0–8.3)

## 2023-02-09 LAB — LIPID PANEL
Cholesterol: 168 mg/dL (ref 0–200)
HDL: 45.6 mg/dL (ref >39.00)
LDL Cholesterol: 106 mg/dL — ABNORMAL HIGH (ref 0–99)
NonHDL: 122.64
Total CHOL/HDL Ratio: 4
Triglycerides: 83 mg/dL (ref 0.0–149.0)
VLDL: 16.6 mg/dL (ref 0.0–40.0)

## 2023-02-09 LAB — TSH: TSH: 2.17 u[IU]/mL (ref 0.35–5.50)

## 2023-02-09 LAB — MICROALBUMIN / CREATININE URINE RATIO
Creatinine,U: 67.3 mg/dL
Microalb Creat Ratio: 17.9 mg/g (ref 0.0–30.0)
Microalb, Ur: 12.1 mg/dL — ABNORMAL HIGH (ref 0.0–1.9)

## 2023-02-09 LAB — VITAMIN D 25 HYDROXY (VIT D DEFICIENCY, FRACTURES): VITD: 39.68 ng/mL (ref 30.00–100.00)

## 2023-02-09 LAB — CBC
HCT: 46.3 % (ref 39.0–52.0)
Hemoglobin: 14.8 g/dL (ref 13.0–17.0)
MCHC: 31.8 g/dL (ref 30.0–36.0)
MCV: 82.9 fL (ref 78.0–100.0)
Platelets: 257 10*3/uL (ref 150.0–400.0)
RBC: 5.59 Mil/uL (ref 4.22–5.81)
RDW: 16.7 % — ABNORMAL HIGH (ref 11.5–15.5)
WBC: 7.5 10*3/uL (ref 4.0–10.5)

## 2023-02-09 LAB — HEMOGLOBIN A1C: Hgb A1c MFr Bld: 5.8 % (ref 4.6–6.5)

## 2023-02-09 NOTE — Patient Instructions (Signed)
It was great seeing you today   We will follow up with you regarding your lab work   Please let me know if you need anything   

## 2023-02-09 NOTE — Progress Notes (Signed)
Subjective:    Patient ID: Jonathan [name], male    DOB: 08-17-1955, 67 y.o.   MRN: [id number]  HPI  Patient presents for yearly preventative medicine examination. He is a pleasant 67 year old male who  has a past medical history of Anxiety, Asthma, BPH (benign prostatic hyperplasia), ED (erectile dysfunction), Elevated PSA, Esophageal reflux, Hearing loss, History of hip replacement, History of knee replacement, Hypertension, Lactose intolerance, Numbness in both legs, OSA on CPAP (2019), Pre-diabetes, Prostate cancer [facility]) (Jan 2016), and Vitamin D deficiency.  Prediabetes/ Obesity - Was managed with Ozempic 1.0  mg weekly for prediabetes and obesity but had to stop this about a month ago as his insurance was not covering it any longer. He is doing light exercises but has started intermittent fasting and eating meat twice a week; he has been able to lose about 10 pounds since starting this about a month ago.  Lab Results  Component Value Date   HGBA1C 5.5 11/09/2022   HGBA1C 5.6 08/10/2022   HGBA1C 6.2 05/07/2022   Wt Readings from Last 10 Encounters:  02/09/23 296 lb (134.3 kg)  01/25/23 300 lb (136.1 kg)  01/06/23 298 lb (135.2 kg)  11/09/22 (!) 304 lb (137.9 kg)  10/15/22 (!) 310 lb (140.6 kg)  10/11/22 (!) 308 lb 6.4 oz (139.9 kg)  09/24/22 (!) 308 lb (139.7 kg)  08/10/22 (!) 310 lb (140.6 kg)  05/24/22 (!) 308 lb (139.7 kg)  05/07/22 (!) 308 lb (139.7 kg)   Essential hypertension - managed with HCTZ 12.5 mg and Atenolol 50 mg daily. He has been monitoring his blood pressure at home with readings in the 130's/80's.  BP Readings from Last 3 Encounters:  02/09/23 (!) 160/100  01/25/23 (!) 162/100  01/06/23 (!) 146/94   OSA-uses CPAP nightly with excellent compliance.  Does not feel fatigued during the day or the need for an afternoon nap.  History of prostate cancer-is followed by urology. Prior treatment includes robotic prostatectomy in 2016.  Hyperlipidemia - not  currently on medication. He has been working on a [employer].  Lab Results  Component Value Date   CHOL 193 12/02/2021   HDL 45.10 12/02/2021   LDLCALC 127 (H) 12/02/2021   TRIG 103.0 12/02/2021   CHOLHDL 4 12/02/2021     All immunizations and health maintenance protocols were reviewed with the patient and needed orders were placed.  Appropriate screening laboratory values were ordered for the patient including screening of hyperlipidemia, renal function and hepatic function. If indicated by BPH, a PSA was ordered.  Medication reconciliation,  past medical history, social history, problem list and allergies were reviewed in detail with the patient  Goals were established with regard to weight loss, exercise, and  diet in compliance with medications  He is up to date on colon cancer screening.   He denies any acute issues    Review of Systems  Constitutional: Negative.   HENT: Negative.    Eyes: Negative.   Respiratory: Negative.    Cardiovascular: Negative.   Gastrointestinal: Negative.   Endocrine: Negative.   Genitourinary: Negative.   Musculoskeletal: Negative.   Skin: Negative.   Allergic/Immunologic: Negative.   Neurological: Negative.   Hematological: Negative.   Psychiatric/Behavioral: Negative.    All other systems reviewed and are negative.  Past Medical History:  Diagnosis Date   Anxiety    Asthma    BPH (benign prostatic hyperplasia)    ED (erectile dysfunction)    Elevated PSA  9.33   Esophageal reflux    Hearing loss    History of hip replacement    History of knee replacement    Hypertension    Lactose intolerance    Numbness in both legs    OSA on CPAP 2019   Pre-diabetes    Prostate cancer [facility]) Jan 2016   prostatectomy   Vitamin D deficiency     Social History   Socioeconomic History   Marital status: Married    Spouse name: Not on file   Number of children: 2   Years of education: Not on file   Highest education level: Not  on file  Occupational History   Occupation: Air traffic controller: [employer]  Tobacco Use   Smoking status: Never    Passive exposure: Never   Smokeless tobacco: Never  Vaping Use   Vaping status: Never Used  Substance and Sexual Activity   Alcohol use: No   Drug use: No   Sexual activity: Yes  Other Topics Concern   Not on file  Social History Narrative   Not on file   Social Determinants of Health   Financial Resource Strain: Low Risk  (10/15/2022)   Overall Financial Resource Strain (CARDIA)    Difficulty of Paying Living Expenses: Not hard at all  Food Insecurity: No Food Insecurity (10/15/2022)   Hunger Vital Sign    Worried About Running Out of Food in the Last Year: Never true    Ran Out of Food in the Last Year: Never true  Transportation Needs: No Transportation Needs (10/15/2022)   PRAPARE - Administrator, Civil Service (Medical): No    Lack of Transportation (Non-Medical): No  Physical Activity: Inactive (10/15/2022)   Exercise Vital Sign    Days of Exercise per Week: 0 days    Minutes of Exercise per Session: 0 min  Stress: No Stress Concern Present (10/15/2022)   [employer] of Occupational Health - Occupational Stress Questionnaire    Feeling of Stress : Not at all  Social Connections: Socially Integrated (10/15/2022)   Social Connection and Isolation Panel [NHANES]    Frequency of Communication with Friends and Family: Never    Frequency of Social Gatherings with Friends and Family: More than three times a week    Attends Religious Services: More than 4 times per year    Active Member of [employer] or Organizations: Yes    Attends Banker Meetings: More than 4 times per year    Marital Status: Married  Catering manager Violence: Not At Risk (10/15/2022)   Humiliation, Afraid, Rape, and Kick questionnaire    Fear of Current or Ex-Partner: No    Emotionally Abused: No    Physically Abused: No    Sexually Abused: No    Past Surgical History:   Procedure Laterality Date   CORONARY CALCIUM SCORE / CARDIAC CT ANGIOGRAM  11/16/2019   Cor Ca++ Score ZERO. NO evidence of CAD. Dominant RCA-<PDA & PLVB with minimal (<25%) calcified plaque. LM no plaque -< LAD & LCx. LAD w/ large D1 - no plaque. Non-dom LCx with 1 Large OM1 - no plaque. Normal PV drainage. Normal LAA & PA size.    FOOT SURGERY     bilateral foot   NM MYOVIEW LTD  07/2007   Normal nuclear stress test.  Mild fixed defect in the anterior wall likely related to soft tissue attenuation.  Normal EF.  Fair exercise capacity.   PROSTATE BIOPSY  06/13/2012  right lat mid gleason 3+3=6   PROSTATECTOMY  2016   TOTAL HIP ARTHROPLASTY  2008 &2007   Right and left   TRANSTHORACIC ECHOCARDIOGRAM  09/2017   EF 55-60%. ??  Normal diastolic function.  No R WMA.  Moderate pulmonary hypertension-PA P estimated 59 mmHg.   TRANSTHORACIC ECHOCARDIOGRAM  11/13/2019    EF 55-60%. NO RWMA. Mild LV dilation & Gr 1 DD. MIld LA dilation. Normal valves. Normal CVP.    Family History  Problem Relation Age of Onset   Hypertension Mother    Colon cancer Mother    Obesity Mother    Prostate cancer Father 32       alive now age 28   Asthma Father    Heart disease Father        Pt is not sure of details (no MI)   Alcoholism Father    Obesity Father    Tuberculosis Brother    Lung cancer Maternal Grandmother        non-smoker    Allergies  Allergen Reactions   Codeine     REACTION: emesis   Cozaar [Losartan Potassium] Hives   Norvasc [Amlodipine] Swelling    Lower extremity edema     Current Outpatient Medications on File Prior to Visit  Medication Sig Dispense Refill   atenolol (TENORMIN) 50 MG tablet TAKE 1 TABLET BY MOUTH EVERY DAY 90 tablet 0   fluticasone (FLONASE) 50 MCG/ACT nasal spray SPRAY 2 SPRAYS INTO EACH NOSTRIL EVERY DAY 48 mL 1   hydrochlorothiazide (HYDRODIURIL) 25 MG tablet Take 1 tablet (25 mg total) by mouth daily. 90 tablet 3   potassium chloride SA (KLOR-CON M) 20  MEQ tablet Take 1 tablet (20 mEq total) by mouth daily. 90 tablet 3   vardenafil (LEVITRA) 20 MG tablet Take 1 tablet (20 mg total) by mouth daily as needed for erectile dysfunction (take 45 minues prior to sexual activity). 5 tablet 11   No current facility-administered medications on file prior to visit.    BP (!) 160/100   Pulse (!) 45   Temp 98.3 F (36.8 C) (Oral)   Ht 5\' 11"  (1.803 m)   Wt 296 lb (134.3 kg)   SpO2 99%   BMI 41.28 kg/m       Objective:   Physical Exam Vitals and nursing note reviewed.  Constitutional:      General: He is not in acute distress.    Appearance: Normal appearance. He is obese. He is not ill-appearing.  HENT:     Head: Normocephalic and atraumatic.     Right Ear: Tympanic membrane, ear canal and external ear normal. There is no impacted cerumen.     Left Ear: Tympanic membrane, ear canal and external ear normal. There is no impacted cerumen.     Nose: Nose normal. No congestion or rhinorrhea.     Mouth/Throat:     Mouth: Mucous membranes are moist.     Pharynx: Oropharynx is clear.  Eyes:     Extraocular Movements: Extraocular movements intact.     Conjunctiva/sclera: Conjunctivae normal.     Pupils: Pupils are equal, round, and reactive to light.  Neck:     Vascular: No carotid bruit.  Cardiovascular:     Rate and Rhythm: Normal rate and regular rhythm.     Pulses: Normal pulses.     Heart sounds: No murmur heard.    No friction rub. No gallop.  Pulmonary:     Effort: Pulmonary effort is normal.  Breath sounds: Normal breath sounds.  Abdominal:     General: Abdomen is flat. Bowel sounds are normal. There is no distension.     Palpations: Abdomen is soft. There is no mass.     Tenderness: There is no abdominal tenderness. There is no guarding or rebound.     Hernia: No hernia is present.  Musculoskeletal:        General: Normal range of motion.     Cervical back: Normal range of motion and neck supple.  Lymphadenopathy:      Cervical: No cervical adenopathy.  Skin:    General: Skin is warm and dry.     Capillary Refill: Capillary refill takes less than 2 seconds.  Neurological:     General: No focal deficit present.     Mental Status: He is alert and oriented to person, place, and time.  Psychiatric:        Mood and Affect: Mood normal.        Behavior: Behavior normal.        Thought Content: Thought content normal.        Judgment: Judgment normal.       Assessment & Plan:   1. Routine general medical examination at a health care facility Today patient counseled on age appropriate routine health concerns for screening and prevention, each reviewed and up to date or declined. Immunizations reviewed and up to date or declined. Labs ordered and reviewed. Risk factors for depression reviewed and negative. Hearing function and visual acuity are intact. ADLs screened and addressed as needed. Functional ability and level of safety reviewed and appropriate. Education, counseling and referrals performed based on assessed risks today. Patient provided with a copy of personalized plan for preventive services. - Refused flu shot. Will get Tdap and PNA at pharmacy   2. Class 2 severe obesity due to excess calories with serious comorbidity and body mass index (BMI) of 39.0 to 39.9 in adult [facility]) - Continue with intermittent fasting  - Lipid panel; Future - TSH; Future - CBC; Future - Comprehensive metabolic panel; Future - Hemoglobin A1c; Future  3. Prediabetes - Continue with intermittent fasting  - Lipid panel; Future - TSH; Future - CBC; Future - Comprehensive metabolic panel; Future - Hemoglobin A1c; Future - Microalbumin/Creatinine Ratio, Urine; Future  4. Essential hypertension  - Lipid panel; Future - TSH; Future - CBC; Future - Comprehensive metabolic panel; Future - Hemoglobin A1c; Future  5. Other hyperlipidemia - Consider statin  - Lipid panel; Future - TSH; Future - CBC; Future -  Comprehensive metabolic panel; Future - Hemoglobin A1c; Future  6. OSA on CPAP - Continue with CPAP - Lipid panel; Future - TSH; Future - CBC; Future - Comprehensive metabolic panel; Future - Hemoglobin A1c; Future  7. Prostate CA [facility]) - Per urology   Shirline Frees, NP

## 2023-02-14 ENCOUNTER — Other Ambulatory Visit: Payer: Medicare Other

## 2023-03-03 ENCOUNTER — Ambulatory Visit
Admission: RE | Admit: 2023-03-03 | Discharge: 2023-03-03 | Disposition: A | Discharge status: Home / Self Care | Payer: Medicare Other | Source: Ambulatory Visit | Attending: Orthopaedic Surgery | Admitting: Orthopaedic Surgery

## 2023-03-03 DIAGNOSIS — M23352 Other meniscus derangements, posterior horn of lateral meniscus, left knee: Secondary | ICD-10-CM | POA: Diagnosis not present

## 2023-03-03 DIAGNOSIS — M25562 Pain in left knee: Secondary | ICD-10-CM

## 2023-03-03 DIAGNOSIS — M7122 Synovial cyst of popliteal space [Baker], left knee: Secondary | ICD-10-CM | POA: Diagnosis not present

## 2023-03-03 DIAGNOSIS — M25462 Effusion, left knee: Secondary | ICD-10-CM | POA: Diagnosis not present

## 2023-03-18 ENCOUNTER — Telehealth: Payer: Self-pay | Admitting: Adult Health

## 2023-03-18 DIAGNOSIS — M1712 Unilateral primary osteoarthritis, left knee: Secondary | ICD-10-CM | POA: Diagnosis not present

## 2023-03-18 NOTE — Telephone Encounter (Signed)
Pt is calling and would like blood work result from oct 2024 and pt is having pain in kidney area and was sent to triage nurse

## 2023-03-18 NOTE — Telephone Encounter (Signed)
Spoke to pt and went over lab result with pt. Verbalized understanding. Pt reports he has appt with Kandee Keen next week and will discuss with provider about taking Lipitor. He states his pain comes and goes. Will follow up with provider at appt.

## 2023-03-22 ENCOUNTER — Ambulatory Visit: Payer: Medicare Other | Admitting: Adult Health

## 2023-03-23 ENCOUNTER — Telehealth: Payer: Self-pay

## 2023-03-23 NOTE — Telephone Encounter (Signed)
Pt called and was worried about labs. Not sure what happened with getting the labs to pt. I went over labs with pt and he verbalized understanding. Pt stated he will talk to [facility] tomorrow regarding the statin medication. No medication was sent in from lab results due to pt previous stated comment.

## 2023-03-24 ENCOUNTER — Encounter: Payer: Self-pay | Admitting: Adult Health

## 2023-03-24 ENCOUNTER — Ambulatory Visit (INDEPENDENT_AMBULATORY_CARE_PROVIDER_SITE_OTHER): Payer: Medicare Other | Admitting: Adult Health

## 2023-03-24 VITALS — BP 140/100 | HR 56 | Temp 98.0°F | Ht 71.0 in | Wt 301.0 lb

## 2023-03-24 DIAGNOSIS — E7849 Other hyperlipidemia: Secondary | ICD-10-CM

## 2023-03-24 DIAGNOSIS — I10 Essential (primary) hypertension: Secondary | ICD-10-CM

## 2023-03-24 MED ORDER — ATORVASTATIN CALCIUM 10 MG PO TABS
10.0000 mg | ORAL_TABLET | Freq: Every day | ORAL | 0 refills | Status: DC
Start: 2023-03-24 — End: 2023-06-21

## 2023-03-24 MED ORDER — ATENOLOL-CHLORTHALIDONE 50-25 MG PO TABS: 1.0000 | ORAL_TABLET | Freq: Every day | ORAL | 3 refills | Status: DC

## 2023-03-24 NOTE — Progress Notes (Signed)
Subjective:    Patient ID: Jonathan [name], male    DOB: [birth year], 67 y.o.   MRN: [id number]  HPI 67 year old male who  has a past medical history of Anxiety, Asthma, BPH (benign prostatic hyperplasia), ED (erectile dysfunction), Elevated PSA, Esophageal reflux, Hearing loss, History of hip replacement, History of knee replacement, Hypertension, Lactose intolerance, Numbness in both legs, OSA on CPAP (2019), Pre-diabetes, Prostate cancer [facility]) (Jan 2016), and Vitamin D deficiency.  He presents to the office today for follow-up regarding hyperlipidemia.  When he was seen about 3 months ago for his CPE we started him on Lipitor  due to  elevated LDL. He reports today that he did not start the lipitor. He wanted think about it and did not want to be a medication for the rest of his life  He also would like to go back to the combination blood pressure medication 10-hour attack instead of having the medications divided.     The 10-year ASCVD risk score (Arnett DK, et al., 2019) is: 19%   Values used to calculate the score:     Age: 88 years     Sex: Male     Is Non-Hispanic African American: Yes     Diabetic: No     Tobacco smoker: No     Systolic Blood Pressure: 140 mmHg     Is BP treated: Yes     HDL Cholesterol: 45.6 mg/dL     Total Cholesterol: 168 mg/dL    Review of Systems See HPI   Past Medical History:  Diagnosis Date   Anxiety    Asthma    BPH (benign prostatic hyperplasia)    ED (erectile dysfunction)    Elevated PSA    9.33   Esophageal reflux    Hearing loss    History of hip replacement    History of knee replacement    Hypertension    Lactose intolerance    Numbness in both legs    OSA on CPAP 2019   Pre-diabetes    Prostate cancer (HCC) Jan 2016   prostatectomy   Vitamin D deficiency     Social History   Socioeconomic History   Marital status: Married    Spouse name: Not on file   Number of children: 2   Years of education: Not on file    Highest education level: Not on file  Occupational History   Occupation: Air traffic controller: [employer]  Tobacco Use   Smoking status: Never    Passive exposure: Never   Smokeless tobacco: Never  Vaping Use   Vaping status: Never Used  Substance and Sexual Activity   Alcohol use: No   Drug use: No   Sexual activity: Yes  Other Topics Concern   Not on file  Social History Narrative   Not on file   Social Drivers of Health   Financial Resource Strain: Low Risk  (10/15/2022)   Overall Financial Resource Strain (CARDIA)    Difficulty of Paying Living Expenses: Not hard at all  Food Insecurity: No Food Insecurity (10/15/2022)   Hunger Vital Sign    Worried About Running Out of Food in the Last Year: Never true    Ran Out of Food in the Last Year: Never true  Transportation Needs: No Transportation Needs (10/15/2022)   PRAPARE - Administrator, Civil Service (Medical): No    Lack of Transportation (Non-Medical): No  Physical Activity: Inactive (10/15/2022)  Exercise Vital Sign    Days of Exercise per Week: 0 days    Minutes of Exercise per Session: 0 min  Stress: No Stress Concern Present (10/15/2022)   [employer] of Occupational Health - Occupational Stress Questionnaire    Feeling of Stress : Not at all  Social Connections: Socially Integrated (10/15/2022)   Social Connection and Isolation Panel [NHANES]    Frequency of Communication with Friends and Family: Never    Frequency of Social Gatherings with Friends and Family: More than three times a week    Attends Religious Services: More than 4 times per year    Active Member of [employer] or Organizations: Yes    Attends Banker Meetings: More than 4 times per year    Marital Status: Married  Catering manager Violence: Not At Risk (10/15/2022)   Humiliation, Afraid, Rape, and Kick questionnaire    Fear of Current or Ex-Partner: No    Emotionally Abused: No    Physically Abused: No    Sexually Abused: No    Past  Surgical History:  Procedure Laterality Date   CORONARY CALCIUM SCORE / CARDIAC CT ANGIOGRAM  11/16/2019   Cor Ca++ Score ZERO. NO evidence of CAD. Dominant RCA-<PDA & PLVB with minimal (<25%) calcified plaque. LM no plaque -< LAD & LCx. LAD w/ large D1 - no plaque. Non-dom LCx with 1 Large OM1 - no plaque. Normal PV drainage. Normal LAA & PA size.    FOOT SURGERY     bilateral foot   NM MYOVIEW LTD  07/2007   Normal nuclear stress test.  Mild fixed defect in the anterior wall likely related to soft tissue attenuation.  Normal EF.  Fair exercise capacity.   PROSTATE BIOPSY  06/13/2012   right lat mid gleason 3+3=6   PROSTATECTOMY  2016   TOTAL HIP ARTHROPLASTY  2008 &2007   Right and left   TRANSTHORACIC ECHOCARDIOGRAM  09/2017   EF 55-60%. ??  Normal diastolic function.  No R WMA.  Moderate pulmonary hypertension-PA P estimated 59 mmHg.   TRANSTHORACIC ECHOCARDIOGRAM  11/13/2019    EF 55-60%. NO RWMA. Mild LV dilation & Gr 1 DD. MIld LA dilation. Normal valves. Normal CVP.    Family History  Problem Relation Age of Onset   Hypertension Mother    Colon cancer Mother    Obesity Mother    Prostate cancer Father 27       alive now age 10   Asthma Father    Heart disease Father        Pt is not sure of details (no MI)   Alcoholism Father    Obesity Father    Tuberculosis Brother    Lung cancer Maternal Grandmother        non-smoker    Allergies  Allergen Reactions   Codeine     REACTION: emesis   Cozaar [Losartan Potassium] Hives   Norvasc [Amlodipine] Swelling    Lower extremity edema     Current Outpatient Medications on File Prior to Visit  Medication Sig Dispense Refill   potassium chloride SA (KLOR-CON M) 20 MEQ tablet Take 1 tablet (20 mEq total) by mouth daily. 90 tablet 3   fluticasone (FLONASE) 50 MCG/ACT nasal spray SPRAY 2 SPRAYS INTO EACH NOSTRIL EVERY DAY (Patient not taking: Reported on 03/24/2023) 48 mL 1   vardenafil (LEVITRA) 20 MG tablet Take 1 tablet (20  mg total) by mouth daily as needed for erectile dysfunction (take 45 minues prior  to sexual activity). (Patient not taking: Reported on 03/24/2023) 5 tablet 11   No current facility-administered medications on file prior to visit.    BP (!) 140/100   Pulse (!) 56   Temp 98 F (36.7 C) (Oral)   Ht 5\' 11"  (1.803 m)   Wt (!) 301 lb (136.5 kg)   SpO2 95%   BMI 41.98 kg/m       Objective:   Physical Exam Vitals and nursing note reviewed.  Constitutional:      Appearance: Normal appearance. He is obese.  Cardiovascular:     Rate and Rhythm: Normal rate and regular rhythm.     Pulses: Normal pulses.     Heart sounds: Normal heart sounds.  Pulmonary:     Effort: Pulmonary effort is normal.     Breath sounds: Normal breath sounds.  Skin:    General: Skin is warm and dry.  Neurological:     General: No focal deficit present.     Mental Status: He is alert and oriented to person, place, and time.  Psychiatric:        Mood and Affect: Mood normal.        Behavior: Behavior normal.        Thought Content: Thought content normal.        Judgment: Judgment normal.       Assessment & Plan:  1. Essential hypertension (Primary)  - atenolol-chlorthalidone (TENORETIC 50) 50-25 MG tablet; Take 1 tablet by mouth daily.  Dispense: 90 tablet; Refill: 3  2. Other hyperlipidemia -We reviewed his 10-year cardiac risk score.  He is agreeable to start low-dose Lipitor and will follow-up in 3 months for recheck - atorvastatin (LIPITOR) 10 MG tablet; Take 1 tablet (10 mg total) by mouth daily.  Dispense: 90 tablet; Refill: 0

## 2023-03-29 ENCOUNTER — Ambulatory Visit: Payer: Medicare Other | Admitting: Adult Health

## 2023-03-31 DIAGNOSIS — D23112 Other benign neoplasm of skin of right lower eyelid, including canthus: Secondary | ICD-10-CM | POA: Diagnosis not present

## 2023-04-07 ENCOUNTER — Ambulatory Visit: Payer: Medicare Other | Admitting: Pulmonary Disease

## 2023-05-05 ENCOUNTER — Telehealth: Payer: Self-pay | Admitting: Pulmonary Disease

## 2023-05-05 NOTE — Telephone Encounter (Deleted)
I do not see any requests for this patient.

## 2023-05-05 NOTE — Telephone Encounter (Signed)
[facility] calling to see if someone here names Carollee Herter (?) had rec'd the fax that they need Office Notes, RX, Sleep Study in order to complete PT's CPAP order.  Guerry Minors @ [phone]

## 2023-05-05 NOTE — Telephone Encounter (Signed)
I do not see any requests for this patient.  I will need to investigate further tomorrow.

## 2023-05-08 ENCOUNTER — Other Ambulatory Visit: Payer: Self-pay | Admitting: Adult Health

## 2023-05-08 DIAGNOSIS — E876 Hypokalemia: Secondary | ICD-10-CM

## 2023-05-10 ENCOUNTER — Other Ambulatory Visit: Payer: Self-pay | Admitting: Adult Health

## 2023-05-10 DIAGNOSIS — I10 Essential (primary) hypertension: Secondary | ICD-10-CM

## 2023-05-10 NOTE — Telephone Encounter (Signed)
  The original prescription was discontinued on 03/24/2023 by Shirline Frees, NP. Renewing this prescription may not be appropriate.

## 2023-06-06 ENCOUNTER — Encounter: Payer: Self-pay | Admitting: Pulmonary Disease

## 2023-06-06 ENCOUNTER — Ambulatory Visit: Payer: Medicare Other | Admitting: Pulmonary Disease

## 2023-06-21 ENCOUNTER — Other Ambulatory Visit: Payer: Self-pay | Admitting: Adult Health

## 2023-06-21 DIAGNOSIS — E7849 Other hyperlipidemia: Secondary | ICD-10-CM

## 2023-07-04 DIAGNOSIS — M1712 Unilateral primary osteoarthritis, left knee: Secondary | ICD-10-CM | POA: Diagnosis not present

## 2023-07-11 DIAGNOSIS — M1712 Unilateral primary osteoarthritis, left knee: Secondary | ICD-10-CM | POA: Diagnosis not present

## 2023-07-18 DIAGNOSIS — M1712 Unilateral primary osteoarthritis, left knee: Secondary | ICD-10-CM | POA: Diagnosis not present

## 2023-07-25 ENCOUNTER — Other Ambulatory Visit: Payer: Self-pay | Admitting: *Deleted

## 2023-07-26 ENCOUNTER — Other Ambulatory Visit: Payer: Medicare Other

## 2023-07-26 DIAGNOSIS — C61 Malignant neoplasm of prostate: Secondary | ICD-10-CM

## 2023-07-27 LAB — PSA: Prostate Specific Ag, Serum: <0.1 ng/mL (ref 0.0–4.0)

## 2023-07-28 ENCOUNTER — Ambulatory Visit: Payer: Medicare Other | Admitting: Adult Health

## 2023-08-02 ENCOUNTER — Ambulatory Visit: Payer: Medicare Other | Admitting: Pulmonary Disease

## 2023-08-02 ENCOUNTER — Encounter: Payer: Self-pay | Admitting: Pulmonary Disease

## 2023-08-02 VITALS — BP 143/84 | HR 52 | Ht 71.0 in | Wt 311.0 lb

## 2023-08-02 DIAGNOSIS — G4733 Obstructive sleep apnea (adult) (pediatric): Secondary | ICD-10-CM | POA: Diagnosis not present

## 2023-08-02 NOTE — Progress Notes (Signed)
 Jonathan [name]    [id number]    24-Nov-1955  Primary Care Physician:Nafziger, Randel Buss, NP  Referring Physician: Alto Atta, NP [address] [place],  [place] [address]  Chief complaint:   Obstructive sleep apnea  HPI:  Has remained compliant with CPAP use  Feels that the pressure in the new machine may be a little high  Continues to work on weight loss  Denies any pain or discomfort  Sleeps well, waking up feeling like his had a good nights rest  On Ozempic  for weight loss   Did have some issues with his knees for which he is being followed up for Recently started on Ozempic   Sleeping well, waking up like he is at a good nights rest  Historically Loud breathing even when taking a nap during the day  Has gained a lot of weight since retiring from the [employer]-he is changing many things to help him lose weight Denies a headache, he does have a dry throat of the mornings History of prostate cancer Orthopnea  Occupation: Worked for the postal service Exposures: No exposure to mold Smoking history: Non-smoker  Outpatient Encounter Medications as of 08/02/2023  Medication Sig   atenolol -chlorthalidone  (TENORETIC  50) 50-25 MG tablet Take 1 tablet by mouth daily.   atorvastatin  (LIPITOR) 10 MG tablet TAKE 1 TABLET BY MOUTH EVERY DAY   fluticasone  (FLONASE ) 50 MCG/ACT nasal spray SPRAY 2 SPRAYS INTO EACH NOSTRIL EVERY DAY   KLOR-CON  M20 20 MEQ tablet TAKE 1 TABLET BY MOUTH EVERY DAY   vardenafil  (LEVITRA ) 20 MG tablet Take 1 tablet (20 mg total) by mouth daily as needed for erectile dysfunction (take 45 minues prior to sexual activity).   No facility-administered encounter medications on file as of 08/02/2023.    Allergies as of 08/02/2023 - Review Complete 08/02/2023  Allergen Reaction Noted   Codeine     Cozaar  [losartan  potassium] Hives 09/08/2011   Norvasc  [amlodipine ] Swelling 05/20/2020    Past Medical History:  Diagnosis Date   Anxiety     Asthma    BPH (benign prostatic hyperplasia)    ED (erectile dysfunction)    Elevated PSA    9.33   Esophageal reflux    Hearing loss    History of hip replacement    History of knee replacement    Hypertension    Lactose intolerance    Numbness in both legs    OSA on CPAP 2019   Pre-diabetes    Prostate cancer [facility]) Jan 2016   prostatectomy   Vitamin D  deficiency     Past Surgical History:  Procedure Laterality Date   CORONARY CALCIUM  SCORE / CARDIAC CT ANGIOGRAM  11/16/2019   Cor Ca++ Score ZERO. NO evidence of CAD. Dominant RCA-<PDA & PLVB with minimal (<25%) calcified plaque. LM no plaque -< LAD & LCx. LAD w/ large D1 - no plaque. Non-dom LCx with 1 Large OM1 - no plaque. Normal PV drainage. Normal LAA & PA size.    FOOT SURGERY     bilateral foot   NM MYOVIEW  LTD  07/2007   Normal nuclear stress test.  Mild fixed defect in the anterior wall likely related to soft tissue attenuation.  Normal EF.  Fair exercise capacity.   PROSTATE BIOPSY  06/13/2012   right lat mid gleason 3+3=6   PROSTATECTOMY  2016   TOTAL HIP ARTHROPLASTY  2008 &2007   Right and left   TRANSTHORACIC ECHOCARDIOGRAM  09/2017  EF 55-60%. ??  Normal diastolic function.  No R WMA.  Moderate pulmonary hypertension-PA P estimated 59 mmHg.   TRANSTHORACIC ECHOCARDIOGRAM  11/13/2019    EF 55-60%. NO RWMA. Mild LV dilation & Gr 1 DD. MIld LA dilation. Normal valves. Normal CVP.    Family History  Problem Relation Age of Onset   Hypertension Mother    Colon cancer Mother    Obesity Mother    Prostate cancer Father 56       alive now age 65   Asthma Father    Heart disease Father        Pt is not sure of details (no MI)   Alcoholism Father    Obesity Father    Tuberculosis Brother    Lung cancer Maternal Grandmother        non-smoker    Social History   Socioeconomic History   Marital status: Married    Spouse name: Not on file   Number of children: 2   Years of education: Not on file   Highest  education level: Not on file  Occupational History   Occupation: Air traffic controller: [employer]  Tobacco Use   Smoking status: Never    Passive exposure: Never   Smokeless tobacco: Never  Vaping Use   Vaping status: Never Used  Substance and Sexual Activity   Alcohol use: No   Drug use: No   Sexual activity: Yes  Other Topics Concern   Not on file  Social History Narrative   Not on file   Social Drivers of Health   Financial Resource Strain: Low Risk  (10/15/2022)   Overall Financial Resource Strain (CARDIA)    Difficulty of Paying Living Expenses: Not hard at all  Food Insecurity: No Food Insecurity (10/15/2022)   Hunger Vital Sign    Worried About Running Out of Food in the Last Year: Never true    Ran Out of Food in the Last Year: Never true  Transportation Needs: No Transportation Needs (10/15/2022)   PRAPARE - Administrator, Civil Service (Medical): No    Lack of Transportation (Non-Medical): No  Physical Activity: Inactive (10/15/2022)   Exercise Vital Sign    Days of Exercise per Week: 0 days    Minutes of Exercise per Session: 0 min  Stress: No Stress Concern Present (10/15/2022)   [employer] of Occupational Health - Occupational Stress Questionnaire    Feeling of Stress : Not at all  Social Connections: Socially Integrated (10/15/2022)   Social Connection and Isolation Panel [NHANES]    Frequency of Communication with Friends and Family: Never    Frequency of Social Gatherings with Friends and Family: More than three times a week    Attends Religious Services: More than 4 times per year    Active Member of [employer] or Organizations: Yes    Attends Engineer, structural: More than 4 times per year    Marital Status: Married  Catering manager Violence: Not At Risk (10/15/2022)   Humiliation, Afraid, Rape, and Kick questionnaire    Fear of Current or Ex-Partner: No    Emotionally Abused: No    Physically Abused: No    Sexually Abused: No    Review of  systems: Review of systems Denies any fever or chills No visual symptoms No shortness of breath with normal activity Was having some bradycardia with his blood pressure medications All other system review is normal  Physical Exam:  Vitals:  08/02/23 1451  BP: (!) 143/84  Pulse: (!) 52  SpO2: 92%   Middle-aged, does not good to be in distress Mallampati 3 Clear breath sounds S1-S2 appreciated Bowel sounds appreciated  Does have some peripheral edema      03/21/2018    3:00 PM 11/22/2017   11:00 AM  Results of the Epworth flowsheet  Sitting and reading 0 3  Watching TV 0 3  Sitting, inactive in a public place (e.g. a theatre or a meeting) 0 3  As a passenger in a car for an hour without a break 0 3  Lying down to rest in the afternoon when circumstances permit 3 3  Sitting and talking to someone 0 2  Sitting quietly after a lunch without alcohol 0 2  In a car, while stopped for a few minutes in traffic 0 0  Total score 3 19    Data Reviewed: Aaron Aas  Recent CT, coronary CT noted  .  Cardiopulmonary exercise study result noted -Compliance revealed 67% compliance, residual AHI of 5.7 -We discussed improving compliance  PFT shows no obstruction, no significant bronchodilator response, moderate restriction with mild reduction in diffusing capacity  Compliance shows 100% set between 5 and 20 Average use of 7 hours 29 minutes Residual AHI of 4.9  Assessment:  Obstructive sleep apnea adequate for CPAP therapy  Class III obesity  History of pulmonary hypertension   Plan/Recommendations: Encouraged to continue using CPAP nightly  Graded activity as tolerated  Weight loss efforts  Follow-up in 3 months  Will tighten pressure to 10-15 from 5-20  Encouraged to call with significant concerns if pressures not tolerated  Myer Artis MD [facility] 08/02/2023, 3:25 PM  CC: Alto Atta, NP

## 2023-08-02 NOTE — Patient Instructions (Signed)
 I will see you back in about 3 months  DME referral for CPAP pressure change  Currently on 5-20  Changed to 10-16  We will get a download from the machine the next time you come in  Call us  with significant concerns

## 2023-08-04 ENCOUNTER — Other Ambulatory Visit: Payer: Self-pay

## 2023-08-04 ENCOUNTER — Ambulatory Visit: Payer: Medicare Other | Admitting: Adult Health

## 2023-08-04 ENCOUNTER — Telehealth: Payer: Self-pay

## 2023-08-04 VITALS — BP 160/100 | HR 72 | Temp 98.1°F | Ht 71.0 in | Wt 312.0 lb

## 2023-08-04 DIAGNOSIS — S161XXA Strain of muscle, fascia and tendon at neck level, initial encounter: Secondary | ICD-10-CM | POA: Diagnosis not present

## 2023-08-04 DIAGNOSIS — I10 Essential (primary) hypertension: Secondary | ICD-10-CM | POA: Diagnosis not present

## 2023-08-04 DIAGNOSIS — E7849 Other hyperlipidemia: Secondary | ICD-10-CM

## 2023-08-04 LAB — LIPID PANEL
Cholesterol: 160 mg/dL (ref 0–200)
HDL: 45.4 mg/dL (ref >39.00)
LDL Cholesterol: 94 mg/dL (ref 0–99)
NonHDL: 114.84
Total CHOL/HDL Ratio: 4
Triglycerides: 103 mg/dL (ref 0.0–149.0)
VLDL: 20.6 mg/dL (ref 0.0–40.0)

## 2023-08-04 MED ORDER — CYCLOBENZAPRINE HCL 10 MG PO TABS
10.0000 mg | ORAL_TABLET | Freq: Three times a day (TID) | ORAL | 0 refills | Status: DC | PRN
Start: 2023-08-04 — End: 2023-11-15

## 2023-08-04 MED ORDER — ATORVASTATIN CALCIUM 20 MG PO TABS: 20.0000 mg | ORAL_TABLET | Freq: Every day | ORAL | 1 refills | Status: DC

## 2023-08-04 NOTE — Progress Notes (Signed)
 Subjective:    Patient ID: Jonathan [name], male    DOB: [birth year], 68 y.o.   MRN: [id number]  HPI 69 year old male who  has a past medical history of Anxiety, Asthma, BPH (benign prostatic hyperplasia), ED (erectile dysfunction), Elevated PSA, Esophageal reflux, Hearing loss, History of hip replacement, History of knee replacement, Hypertension, Lactose intolerance, Numbness in both legs, OSA on CPAP (2019), Pre-diabetes, Prostate cancer [facility]) (Jan 2016), and Vitamin D  deficiency.  He presents to the office today for follow up regarding Hyperlipidemia and Hypertension   He was started on Lipitor 10 mg daily about 3 months ago.  He has been taking his medication daily and denies myalgia  Lab Results  Component Value Date   CHOL 168 02/09/2023   HDL 45.60 02/09/2023   LDLCALC 106 (H) 02/09/2023   TRIG 83.0 02/09/2023   CHOLHDL 4 02/09/2023   Hypertension -managed with atenolol  rec 50-25 mg daily.  He denies dizziness, lightheadedness, chest pain, or shortness of breath. He does monitor his BP at home and reports readings in the 120-130 range. He did not take his medication today.  BP Readings from Last 3 Encounters:  08/04/23 (!) 160/100  08/02/23 (!) 143/84  03/24/23 (!) 140/100   Acutely he reports that about two weeks ago he was walking out the door and turned around too quickly and felt a pain in the right side of his neck. Since that time he has had loss of ROM and pain. He has not been able to wear his CPAP due to the pain. At home he has tried OTC medications without improvement  Review of Systems See HPI   Past Medical History:  Diagnosis Date   Anxiety    Asthma    BPH (benign prostatic hyperplasia)    ED (erectile dysfunction)    Elevated PSA    9.33   Esophageal reflux    Hearing loss    History of hip replacement    History of knee replacement    Hypertension    Lactose intolerance    Numbness in both legs    OSA on CPAP 2019   Pre-diabetes    Prostate  cancer (HCC) Jan 2016   prostatectomy   Vitamin D  deficiency     Social History   Socioeconomic History   Marital status: Married    Spouse name: Not on file   Number of children: 2   Years of education: Not on file   Highest education level: Not on file  Occupational History   Occupation: Air traffic controller: [employer]  Tobacco Use   Smoking status: Never    Passive exposure: Never   Smokeless tobacco: Never  Vaping Use   Vaping status: Never Used  Substance and Sexual Activity   Alcohol use: No   Drug use: No   Sexual activity: Yes  Other Topics Concern   Not on file  Social History Narrative   Not on file   Social Drivers of Health   Financial Resource Strain: Low Risk  (10/15/2022)   Overall Financial Resource Strain (CARDIA)    Difficulty of Paying Living Expenses: Not hard at all  Food Insecurity: No Food Insecurity (10/15/2022)   Hunger Vital Sign    Worried About Running Out of Food in the Last Year: Never true    Ran Out of Food in the Last Year: Never true  Transportation Needs: No Transportation Needs (10/15/2022)   PRAPARE - Transportation    Lack  of Transportation (Medical): No    Lack of Transportation (Non-Medical): No  Physical Activity: Inactive (10/15/2022)   Exercise Vital Sign    Days of Exercise per Week: 0 days    Minutes of Exercise per Session: 0 min  Stress: No Stress Concern Present (10/15/2022)   [employer] of Occupational Health - Occupational Stress Questionnaire    Feeling of Stress : Not at all  Social Connections: Socially Integrated (10/15/2022)   Social Connection and Isolation Panel [NHANES]    Frequency of Communication with Friends and Family: Never    Frequency of Social Gatherings with Friends and Family: More than three times a week    Attends Religious Services: More than 4 times per year    Active Member of [employer] or Organizations: Yes    Attends Banker Meetings: More than 4 times per year    Marital Status: Married   Catering manager Violence: Not At Risk (10/15/2022)   Humiliation, Afraid, Rape, and Kick questionnaire    Fear of Current or Ex-Partner: No    Emotionally Abused: No    Physically Abused: No    Sexually Abused: No    Past Surgical History:  Procedure Laterality Date   CORONARY CALCIUM  SCORE / CARDIAC CT ANGIOGRAM  11/16/2019   Cor Ca++ Score ZERO. NO evidence of CAD. Dominant RCA-<PDA & PLVB with minimal (<25%) calcified plaque. LM no plaque -< LAD & LCx. LAD w/ large D1 - no plaque. Non-dom LCx with 1 Large OM1 - no plaque. Normal PV drainage. Normal LAA & PA size.    FOOT SURGERY     bilateral foot   NM MYOVIEW  LTD  07/2007   Normal nuclear stress test.  Mild fixed defect in the anterior wall likely related to soft tissue attenuation.  Normal EF.  Fair exercise capacity.   PROSTATE BIOPSY  06/13/2012   right lat mid gleason 3+3=6   PROSTATECTOMY  2016   TOTAL HIP ARTHROPLASTY  2008 &2007   Right and left   TRANSTHORACIC ECHOCARDIOGRAM  09/2017   EF 55-60%. ??  Normal diastolic function.  No R WMA.  Moderate pulmonary hypertension-PA P estimated 59 mmHg.   TRANSTHORACIC ECHOCARDIOGRAM  11/13/2019    EF 55-60%. NO RWMA. Mild LV dilation & Gr 1 DD. MIld LA dilation. Normal valves. Normal CVP.    Family History  Problem Relation Age of Onset   Hypertension Mother    Colon cancer Mother    Obesity Mother    Prostate cancer Father 70       alive now age 58   Asthma Father    Heart disease Father        Pt is not sure of details (no MI)   Alcoholism Father    Obesity Father    Tuberculosis Brother    Lung cancer Maternal Grandmother        non-smoker    Allergies  Allergen Reactions   Codeine     REACTION: emesis   Cozaar  [Losartan  Potassium] Hives   Norvasc  [Amlodipine ] Swelling    Lower extremity edema     Current Outpatient Medications on File Prior to Visit  Medication Sig Dispense Refill   atenolol -chlorthalidone  (TENORETIC  50) 50-25 MG tablet Take 1 tablet by  mouth daily. 90 tablet 3   atorvastatin  (LIPITOR) 10 MG tablet TAKE 1 TABLET BY MOUTH EVERY DAY 90 tablet 0   fluticasone  (FLONASE ) 50 MCG/ACT nasal spray SPRAY 2 SPRAYS INTO EACH NOSTRIL EVERY DAY 48 mL 1  KLOR-CON  M20 20 MEQ tablet TAKE 1 TABLET BY MOUTH EVERY DAY 90 tablet 3   vardenafil  (LEVITRA ) 20 MG tablet Take 1 tablet (20 mg total) by mouth daily as needed for erectile dysfunction (take 45 minues prior to sexual activity). 5 tablet 11   No current facility-administered medications on file prior to visit.    BP (!) 160/100   Pulse 72   Temp 98.1 F (36.7 C)   Ht 5\' 11"  (1.803 m)   Wt (!) 312 lb (141.5 kg)   SpO2 96%   BMI 43.52 kg/m       Objective:   Physical Exam Vitals and nursing note reviewed.  Constitutional:      Appearance: Normal appearance. He is obese.  Neck:   Cardiovascular:     Rate and Rhythm: Normal rate and regular rhythm.     Pulses: Normal pulses.     Heart sounds: Normal heart sounds.  Pulmonary:     Effort: Pulmonary effort is normal.     Breath sounds: Normal breath sounds.  Musculoskeletal:     Cervical back: Torticollis present. Pain with movement and muscular tenderness present. No spinous process tenderness. Decreased range of motion.  Skin:    General: Skin is warm and dry.  Neurological:     General: No focal deficit present.     Mental Status: He is alert and oriented to person, place, and time.  Psychiatric:        Mood and Affect: Mood normal.        Behavior: Behavior normal.        Thought Content: Thought content normal.        Judgment: Judgment normal.        Assessment & Plan:  1. Other hyperlipidemia (Primary) - Consider increase in statin  - Lipid panel; Future  2. Essential hypertension - Elevated today due to not taking medication prior to appointment   3. Neck muscle strain, initial encounter - Advised warm compress and will send in Flexeril .  - Follow up if not resolving in the next 3-4 days  -  cyclobenzaprine  (FLEXERIL ) 10 MG tablet; Take 1 tablet (10 mg total) by mouth 3 (three) times daily as needed for muscle spasms.  Dispense: 30 tablet; Refill: 0  Alto Atta, NP

## 2023-08-04 NOTE — Telephone Encounter (Signed)
 Pt called back per call center and advised that he will increase dose of Lipitor to 20. Please see phone note from results. Rx sent to pharmacy.

## 2023-08-15 DIAGNOSIS — M25562 Pain in left knee: Secondary | ICD-10-CM | POA: Diagnosis not present

## 2023-08-30 NOTE — Telephone Encounter (Signed)
 Copied from CRM [id number]. Topic: General - Other >> Aug 30, 2023  4:04 PM Freya Jesus wrote: Reason for CRM: Patient called said his provider advised him to call when he is ready to start back up on Ozempic  so patient is calling to let his providers nurse know he is ready.    Called pt to advise of message below and of the refill but no answer. Pt need to call and schedule a 1 month appt. To follow up.

## 2023-08-31 ENCOUNTER — Other Ambulatory Visit: Payer: Self-pay

## 2023-08-31 DIAGNOSIS — E66812 Obesity, class 2: Secondary | ICD-10-CM

## 2023-08-31 DIAGNOSIS — R7303 Prediabetes: Secondary | ICD-10-CM

## 2023-08-31 MED ORDER — SEMAGLUTIDE(0.25 OR 0.5MG/DOS) 2 MG/3ML ~~LOC~~ SOPN: 0.2500 mg | PEN_INJECTOR | SUBCUTANEOUS | 0 refills | Status: AC

## 2023-08-31 NOTE — Telephone Encounter (Signed)
 Patient notified of update  and verbalized understanding.

## 2023-10-19 ENCOUNTER — Ambulatory Visit: Payer: Medicare Other

## 2023-10-19 VITALS — Ht 71.0 in | Wt 312.0 lb

## 2023-10-19 DIAGNOSIS — Z0000 Encounter for general adult medical examination without abnormal findings: Secondary | ICD-10-CM | POA: Diagnosis not present

## 2023-10-19 NOTE — Progress Notes (Signed)
 Subjective:   Jonathan [name] is a 68 y.o. who presents for a Medicare Wellness preventive visit.  As a reminder, Annual Wellness Visits don't include a physical exam, and some assessments may be limited, especially if this visit is performed virtually. We may recommend an in-person follow-up visit with your provider if needed.  Visit Complete: Virtual I connected with  Jonathan [name] on 10/19/23 by a audio enabled telemedicine application and verified that I am speaking with the correct person using two identifiers.  Patient Location: Home  Provider Location: Home Office  I discussed the limitations of evaluation and management by telemedicine. The patient expressed understanding and agreed to proceed.  Vital Signs: Because this visit was a virtual/telehealth visit, some criteria may be missing or patient reported. Any vitals not documented were not able to be obtained and vitals that have been documented are patient reported.    Persons Participating in Visit: Patient.  AWV Questionnaire: No: Patient Medicare AWV questionnaire was not completed prior to this visit.  Cardiac Risk Factors include: advanced age (>73men, >40 women);obesity (BMI >30kg/m2);male gender;hypertension     Objective:    Today's Vitals   10/19/23 1044  Weight: (!) 312 lb (141.5 kg)  Height: 5' 11 (1.803 m)   Body mass index is 43.52 kg/m.     10/19/2023   10:50 AM 10/15/2022   11:03 AM 05/24/2022   11:25 AM 10/12/2021   11:41 AM 09/14/2021   12:56 PM 01/05/2021   11:12 AM 07/14/2020    8:34 AM  Advanced Directives  Does Patient Have a Medical Advance Directive? Yes Yes Yes Yes No Yes Yes  Type of Estate agent of [place];Living will Healthcare Power of [place];Living will  Healthcare Power of [place];Living will  Healthcare Power of [place];Living will Living will  Does patient want to make changes to medical advance directive?   No - Patient declined No - Patient declined      Copy of Healthcare Power of Attorney in Chart? No - copy requested No - copy requested  No - copy requested     Would patient like information on creating a medical advance directive?     No - Patient declined      Current Medications (verified) Outpatient Encounter Medications as of 10/19/2023  Medication Sig   atenolol -chlorthalidone  (TENORETIC  50) 50-25 MG tablet Take 1 tablet by mouth daily.   atorvastatin  (LIPITOR) 20 MG tablet Take 1 tablet (20 mg total) by mouth daily.   cyclobenzaprine  (FLEXERIL ) 10 MG tablet Take 1 tablet (10 mg total) by mouth 3 (three) times daily as needed for muscle spasms.   fluticasone  (FLONASE ) 50 MCG/ACT nasal spray SPRAY 2 SPRAYS INTO EACH NOSTRIL EVERY DAY   KLOR-CON  M20 20 MEQ tablet TAKE 1 TABLET BY MOUTH EVERY DAY   Semaglutide ,0.25 or 0.5MG /DOS, 2 MG/3ML SOPN Inject 0.25 mg into the skin once a week.   vardenafil  (LEVITRA ) 20 MG tablet Take 1 tablet (20 mg total) by mouth daily as needed for erectile dysfunction (take 45 minues prior to sexual activity).   No facility-administered encounter medications on file as of 10/19/2023.    Allergies (verified) Codeine, Cozaar  [losartan  potassium], and Norvasc  [amlodipine ]   History: Past Medical History:  Diagnosis Date   Anxiety    Asthma    BPH (benign prostatic hyperplasia)    ED (erectile dysfunction)    Elevated PSA    9.33   Esophageal reflux    Hearing loss    History  of hip replacement    History of knee replacement    Hypertension    Lactose intolerance    Numbness in both legs    OSA on CPAP 2019   Pre-diabetes    Prostate cancer [facility]) Jan 2016   prostatectomy   Vitamin D  deficiency    Past Surgical History:  Procedure Laterality Date   CORONARY CALCIUM  SCORE / CARDIAC CT ANGIOGRAM  11/16/2019   Cor Ca++ Score ZERO. NO evidence of CAD. Dominant RCA-<PDA & PLVB with minimal (<25%) calcified plaque. LM no plaque -< LAD & LCx. LAD w/ large D1 - no plaque. Non-dom LCx with 1 Large OM1 -  no plaque. Normal PV drainage. Normal LAA & PA size.    FOOT SURGERY     bilateral foot   NM MYOVIEW  LTD  07/2007   Normal nuclear stress test.  Mild fixed defect in the anterior wall likely related to soft tissue attenuation.  Normal EF.  Fair exercise capacity.   PROSTATE BIOPSY  06/13/2012   right lat mid gleason 3+3=6   PROSTATECTOMY  2016   TOTAL HIP ARTHROPLASTY  2008 &2007   Right and left   TRANSTHORACIC ECHOCARDIOGRAM  09/2017   EF 55-60%. ??  Normal diastolic function.  No R WMA.  Moderate pulmonary hypertension-PA P estimated 59 mmHg.   TRANSTHORACIC ECHOCARDIOGRAM  11/13/2019    EF 55-60%. NO RWMA. Mild LV dilation & Gr 1 DD. MIld LA dilation. Normal valves. Normal CVP.   Family History  Problem Relation Age of Onset   Hypertension Mother    Colon cancer Mother    Obesity Mother    Prostate cancer Father 33       alive now age 58   Asthma Father    Heart disease Father        Pt is not sure of details (no MI)   Alcoholism Father    Obesity Father    Tuberculosis Brother    Lung cancer Maternal Grandmother        non-smoker   Social History   Socioeconomic History   Marital status: Married    Spouse name: Not on file   Number of children: 2   Years of education: Not on file   Highest education level: Not on file  Occupational History   Occupation: Air traffic controller: [employer]  Tobacco Use   Smoking status: Never    Passive exposure: Never   Smokeless tobacco: Never  Vaping Use   Vaping status: Never Used  Substance and Sexual Activity   Alcohol use: No   Drug use: No   Sexual activity: Yes  Other Topics Concern   Not on file  Social History Narrative   Not on file   Social Drivers of Health   Financial Resource Strain: Low Risk  (10/19/2023)   Overall Financial Resource Strain (CARDIA)    Difficulty of Paying Living Expenses: Not hard at all  Food Insecurity: No Food Insecurity (10/19/2023)   Hunger Vital Sign    Worried About Running Out of Food in the  Last Year: Never true    Ran Out of Food in the Last Year: Never true  Transportation Needs: No Transportation Needs (10/19/2023)   PRAPARE - Administrator, Civil Service (Medical): No    Lack of Transportation (Non-Medical): No  Physical Activity: Insufficiently Active (10/19/2023)   Exercise Vital Sign    Days of Exercise per Week: 2 days    Minutes of  Exercise per Session: 20 min  Stress: No Stress Concern Present (10/19/2023)   [employer] of Occupational Health - Occupational Stress Questionnaire    Feeling of Stress: Not at all  Social Connections: Socially Integrated (10/19/2023)   Social Connection and Isolation Panel    Frequency of Communication with Friends and Family: More than three times a week    Frequency of Social Gatherings with Friends and Family: More than three times a week    Attends Religious Services: More than 4 times per year    Active Member of [employer] or Organizations: Yes    Attends Engineer, structural: More than 4 times per year    Marital Status: Married    Tobacco Counseling Counseling given: Not Answered    Clinical Intake:  Pre-visit preparation completed: Yes  Pain : No/denies pain     BMI - recorded: 43.52 Nutritional Status: BMI > 30  Obese Nutritional Risks: None Diabetes: No  Lab Results  Component Value Date   HGBA1C 5.8 02/09/2023   HGBA1C 5.5 11/09/2022   HGBA1C 5.6 08/10/2022     How often do you need to have someone help you when you read instructions, pamphlets, or other written materials from your doctor or pharmacy?: 1 - Never  Interpreter Needed?: No  Information entered by :: Rojelio Blush LPN   Activities of Daily Living     10/19/2023   10:49 AM  In your present state of health, do you have any difficulty performing the following activities:  Hearing? 0  Vision? 0  Difficulty concentrating or making decisions? 0  Walking or climbing stairs? 0  Dressing or bathing? 0  Doing errands,  shopping? 0  Preparing Food and eating ? N  Using the Toilet? N  In the past six months, have you accidently leaked urine? N  Do you have problems with loss of bowel control? N  Managing your Medications? N  Managing your Finances? N  Housekeeping or managing your Housekeeping? N    Patient Care Team: Merna Huxley, NP as PCP - General (Family Medicine) Merna Huxley, NP as Nurse Practitioner (Family Medicine) [facility] ([facility], [facility]  I have updated your Care Teams any recent Medical Services you may have received from other providers in the past year.     Assessment:   This is a routine wellness examination for [place].  Hearing/Vision screen Hearing Screening - Comments:: Denies hearing difficulties   Vision Screening - Comments:: Wears rx glasses - up to date with routine eye exams with  Dr Robinson   Goals Addressed               This Visit's Progress     Increase physical activity (pt-stated)        Lose weight       Depression Screen     10/19/2023   10:48 AM 10/15/2022   11:04 AM 08/10/2022   10:27 AM 10/12/2021   11:36 AM 07/14/2020    8:35 AM 07/14/2020    8:34 AM 08/28/2018    9:37 AM  PHQ 2/9 Scores  PHQ - 2 Score 0 0 0 0 0 0 5  PHQ- 9 Score  6     19  Exception Documentation       Medical reason    Fall Risk     10/19/2023   10:49 AM 08/02/2023    2:50 PM 10/15/2022   11:04 AM 10/12/2021   11:40 AM 07/14/2020  8:37 AM  Fall Risk   Falls in the past year? 0 0 0 0 0  Number falls in past yr: 0  0 0 0  Injury with Fall? 0  0 0 0  Risk for fall due to : No Fall Risks  Medication side effect No Fall Risks   Follow up Falls evaluation completed  Falls prevention discussed;Falls evaluation completed  Falls evaluation completed      Data saved with a previous flowsheet row definition    MEDICARE RISK AT HOME:  Medicare Risk at Home Any stairs in or around the home?: Yes If so, are there any without handrails?:  No Home free of loose throw rugs in walkways, pet beds, electrical cords, etc?: Yes Adequate lighting in your home to reduce risk of falls?: Yes Life alert?: No Use of a cane, walker or w/c?: No Grab bars in the bathroom?: No Shower chair or bench in shower?: No Elevated toilet seat or a handicapped toilet?: No  TIMED UP AND GO:  Was the test performed?  No  Cognitive Function: 6CIT completed        10/19/2023   10:50 AM 10/15/2022   11:06 AM 10/12/2021   11:41 AM  6CIT Screen  What Year? 0 points 0 points 0 points  What month? 0 points 0 points 0 points  What time? 0 points 0 points 0 points  Count back from 20 0 points 0 points 0 points  Months in reverse 0 points 0 points 0 points  Repeat phrase 0 points 2 points 0 points  Total Score 0 points 2 points 0 points    Immunizations Immunization History  Administered Date(s) Administered   Influenza Split 01/11/2012   Influenza,inj,Quad PF,6+ Mos 01/22/2014   Janssen (J&J) SARS-COV-2 Vaccination 09/05/2019   Rsv, Bivalent, Protein Subunit Rsvpref,pf [place]) 04/25/2023   Td 04/12/1997, 11/15/2008   Tdap 06/23/2023   Zoster Recombinant(Shingrix) 05/09/2019, 07/12/2019    Screening Tests Health Maintenance  Topic Date Due   Pneumococcal Vaccine: 50+ Years (1 of 1 - PCV) Never done   INFLUENZA VACCINE  11/11/2023   Medicare Annual Wellness (AWV)  10/18/2024   Colonoscopy  08/03/2027   DTaP/Tdap/Td (4 - Td or Tdap) 06/22/2033   Hepatitis C Screening  Completed   Zoster Vaccines- Shingrix  Completed   Hepatitis B Vaccines  Aged Out   HPV VACCINES  Aged Out   Meningococcal B Vaccine  Aged Out   COVID-19 Vaccine  Discontinued    Health Maintenance  Health Maintenance Due  Topic Date Due   Pneumococcal Vaccine: 50+ Years (1 of 1 - PCV) Never done   Health Maintenance Items Addressed:   Additional Screening:  Vision Screening: Recommended annual ophthalmology exams for early detection of glaucoma and other  disorders of the eye. Would you like a referral to an eye doctor? No    Dental Screening: Recommended annual dental exams for proper oral hygiene  Community Resource Referral / Chronic Care Management: CRR required this visit?  No   CCM required this visit?  No   Plan:    I have personally reviewed and noted the following in the patient's chart:   Medical and social history Use of alcohol, tobacco or illicit drugs  Current medications and supplements including opioid prescriptions. Patient is not currently taking opioid prescriptions. Functional ability and status Nutritional status Physical activity Advanced directives List of other physicians Hospitalizations, surgeries, and ER visits in previous 12 months Vitals Screenings to include cognitive, depression,  and falls Referrals and appointments  In addition, I have reviewed and discussed with patient certain preventive protocols, quality metrics, and best practice recommendations. A written personalized care plan for preventive services as well as general preventive health recommendations were provided to patient.   Rojelio LELON Blush, LPN   2/0/7974   After Visit Summary: (MyChart) Due to this being a telephonic visit, the after visit summary with patients personalized plan was offered to patient via MyChart   Notes: Nothing significant to report at this time.

## 2023-10-19 NOTE — Patient Instructions (Addendum)
 Mr. Jonathan [name] , Thank you for taking time out of your busy schedule to complete your Annual Wellness Visit with me. I enjoyed our conversation and look forward to speaking with you again next year. I, as well as your care team,  appreciate your ongoing commitment to your health goals. Please review the following plan we discussed and let me know if I can assist you in the future. Your Game plan/ To Do List    Referrals: If you haven't heard from the office you've been referred to, please reach out to them at the phone provided.   Follow up Visits: Next Medicare AWV with our clinical staff: 10/24/24 @ 10:40a   Have you seen your provider in the last 6 months (3 months if uncontrolled diabetes)? 08/04/23 Next Office Visit with your provider:   Clinician Recommendations:  Aim for 30 minutes of exercise or brisk walking, 6-8 glasses of water, and 5 servings of fruits and vegetables each day.       This is a list of the screening recommended for you and due dates:  Health Maintenance  Topic Date Due   Pneumococcal Vaccine for age over 12 (1 of 1 - PCV) Never done   Flu Shot  11/11/2023   Medicare Annual Wellness Visit  10/18/2024   Colon Cancer Screening  08/03/2027   DTaP/Tdap/Td vaccine (4 - Td or Tdap) 06/22/2033   Hepatitis C Screening  Completed   Zoster (Shingles) Vaccine  Completed   Hepatitis B Vaccine  Aged Out   HPV Vaccine  Aged Out   Meningitis B Vaccine  Aged Out   COVID-19 Vaccine  Discontinued    Advanced directives: (Copy Requested) Please bring a copy of your health care power of attorney and living will to the office to be added to your chart at your convenience. You can mail to [facility] [address] [place], [place] [address] or email to [email or web address] Advance Care Planning is important because it:  [x]  Makes sure you receive the medical care that is consistent with your values, goals, and preferences  [x]  It provides guidance to your  family and loved ones and reduces their decisional burden about whether or not they are making the right decisions based on your wishes.  Follow the link provided in your after visit summary or read over the paperwork we have mailed to you to help you started getting your Advance Directives in place. If you need assistance in completing these, please reach out to us  so that we can help you!  See attachments for Preventive Care and Fall Prevention Tips.

## 2023-11-02 ENCOUNTER — Ambulatory Visit: Admitting: Pulmonary Disease

## 2023-11-11 ENCOUNTER — Encounter: Payer: Self-pay | Admitting: Urology

## 2023-11-15 ENCOUNTER — Ambulatory Visit: Payer: Self-pay

## 2023-11-15 ENCOUNTER — Ambulatory Visit (INDEPENDENT_AMBULATORY_CARE_PROVIDER_SITE_OTHER): Admitting: Adult Health

## 2023-11-15 VITALS — BP 126/82 | HR 49 | Temp 98.3°F | Ht 71.0 in | Wt 319.0 lb

## 2023-11-15 DIAGNOSIS — L309 Dermatitis, unspecified: Secondary | ICD-10-CM | POA: Diagnosis not present

## 2023-11-15 DIAGNOSIS — K5900 Constipation, unspecified: Secondary | ICD-10-CM

## 2023-11-15 DIAGNOSIS — R109 Unspecified abdominal pain: Secondary | ICD-10-CM | POA: Diagnosis not present

## 2023-11-15 LAB — POCT URINALYSIS DIPSTICK
Bilirubin, UA: NEGATIVE
Glucose, UA: NEGATIVE
Ketones, UA: NEGATIVE
Nitrite, UA: NEGATIVE
Protein, UA: POSITIVE — AB
Spec Grav, UA: 1.015 (ref 1.010–1.025)
Urobilinogen, UA: 0.2 U/dL
pH, UA: 7 (ref 5.0–8.0)

## 2023-11-15 MED ORDER — FLUOCINOLONE ACETONIDE 0.01 % OT OIL: TOPICAL_OIL | OTIC | 0 refills | Status: DC

## 2023-11-15 MED ORDER — TRIAMCINOLONE ACETONIDE 0.025 % EX CREA: 1.0000 | TOPICAL_CREAM | Freq: Two times a day (BID) | CUTANEOUS | 0 refills | Status: AC

## 2023-11-15 NOTE — Patient Instructions (Signed)
 It was great seeing you today   I am going to send your urine to the lab   In the meantime I am going to have you use Miralax twice a day until you have a bowel movement   Please call your insurance and see if they cover Zepbound for sleep apnea

## 2023-11-15 NOTE — Progress Notes (Signed)
 Subjective:    Patient ID: Jonathan [name], male    DOB: 28-Jul-1955, 68 y.o.   MRN: [id number]  Flank Pain   68 year old male who  has a past medical history of Anxiety, Asthma, BPH (benign prostatic hyperplasia), ED (erectile dysfunction), Elevated PSA, Esophageal reflux, Hearing loss, History of hip replacement, History of knee replacement, Hypertension, Lactose intolerance, Numbness in both legs, OSA on CPAP (2019), Pre-diabetes, Prostate cancer [facility]) (Jan 2016), and Vitamin D  deficiency.   Discussed the use of AI scribe software for clinical note transcription with the patient, who gave verbal consent to proceed.  History of Present Illness   Jonathan [name] is a 68 year old male who presents with right flank pain and constipation.  Five days ago he started to experience sharp, electrode-like pains in the right flank, initially lasting about ten seconds and recurring every three to four hours. The pain occurred three times on the same day, once the following day, and once more on Sunday, with no recurrence over the last two days. He has no fever or chills.  He has a change in bowel habits with irregular stools and a sensation of constipation, feeling the need to defecate but unable to do so. This change is recent, he is passing gas.   He experiences occasional burning during urination, possibly coinciding with the episodes of flank pain. There is no blood in urine and no significant changes in urinary urgency or frequency.   He also has been experiencing eczema across his hair line and would like to have his steroid cream and Fluocinolone  oil renewed; this has worked well for him in the past       Review of Systems  Genitourinary:  Positive for flank pain.   See HPI   Past Medical History:  Diagnosis Date   Anxiety    Asthma    BPH (benign prostatic hyperplasia)    ED (erectile dysfunction)    Elevated PSA    9.33   Esophageal reflux    Hearing loss    History of hip  replacement    History of knee replacement    Hypertension    Lactose intolerance    Numbness in both legs    OSA on CPAP 2019   Pre-diabetes    Prostate cancer [facility]) Jan 2016   prostatectomy   Vitamin D  deficiency     Social History   Socioeconomic History   Marital status: Married    Spouse name: Not on file   Number of children: 2   Years of education: Not on file   Highest education level: Associate degree: occupational, Scientist, product/process development, or vocational program  Occupational History   Occupation: Air traffic controller: [employer]  Tobacco Use   Smoking status: Never    Passive exposure: Never   Smokeless tobacco: Never  Vaping Use   Vaping status: Never Used  Substance and Sexual Activity   Alcohol use: No   Drug use: No   Sexual activity: Yes  Other Topics Concern   Not on file  Social History Narrative   Not on file   Social Drivers of Health   Financial Resource Strain: Low Risk  (11/15/2023)   Overall Financial Resource Strain (CARDIA)    Difficulty of Paying Living Expenses: Not very hard  Food Insecurity: No Food Insecurity (11/15/2023)   Hunger Vital Sign    Worried About Running Out of Food in the Last Year: Never true    Ran  Out of Food in the Last Year: Never true  Transportation Needs: No Transportation Needs (11/15/2023)   PRAPARE - Administrator, Civil Service (Medical): No    Lack of Transportation (Non-Medical): No  Physical Activity: Insufficiently Active (11/15/2023)   Exercise Vital Sign    Days of Exercise per Week: 2 days    Minutes of Exercise per Session: 20 min  Stress: No Stress Concern Present (11/15/2023)   [employer] of Occupational Health - Occupational Stress Questionnaire    Feeling of Stress: Only a little  Social Connections: Socially Integrated (11/15/2023)   Social Connection and Isolation Panel    Frequency of Communication with Friends and Family: More than three times a week    Frequency of Social Gatherings with Friends and  Family: Twice a week    Attends Religious Services: 1 to 4 times per year    Active Member of [employer] or Organizations: Yes    Attends Banker Meetings: 1 to 4 times per year    Marital Status: Married  Catering manager Violence: Not At Risk (10/19/2023)   Humiliation, Afraid, Rape, and Kick questionnaire    Fear of Current or Ex-Partner: No    Emotionally Abused: No    Physically Abused: No    Sexually Abused: No    Past Surgical History:  Procedure Laterality Date   CORONARY CALCIUM  SCORE / CARDIAC CT ANGIOGRAM  11/16/2019   Cor Ca++ Score ZERO. NO evidence of CAD. Dominant RCA-<PDA & PLVB with minimal (<25%) calcified plaque. LM no plaque -< LAD & LCx. LAD w/ large D1 - no plaque. Non-dom LCx with 1 Large OM1 - no plaque. Normal PV drainage. Normal LAA & PA size.    FOOT SURGERY     bilateral foot   NM MYOVIEW  LTD  07/2007   Normal nuclear stress test.  Mild fixed defect in the anterior wall likely related to soft tissue attenuation.  Normal EF.  Fair exercise capacity.   PROSTATE BIOPSY  06/13/2012   right lat mid gleason 3+3=6   PROSTATECTOMY  2016   TOTAL HIP ARTHROPLASTY  2008 &2007   Right and left   TRANSTHORACIC ECHOCARDIOGRAM  09/2017   EF 55-60%. ??  Normal diastolic function.  No R WMA.  Moderate pulmonary hypertension-PA P estimated 59 mmHg.   TRANSTHORACIC ECHOCARDIOGRAM  11/13/2019    EF 55-60%. NO RWMA. Mild LV dilation & Gr 1 DD. MIld LA dilation. Normal valves. Normal CVP.    Family History  Problem Relation Age of Onset   Hypertension Mother    Colon cancer Mother    Obesity Mother    Prostate cancer Father 28       alive now age 25   Asthma Father    Heart disease Father        Pt is not sure of details (no MI)   Alcoholism Father    Obesity Father    Tuberculosis Brother    Lung cancer Maternal Grandmother        non-smoker    Allergies  Allergen Reactions   Codeine     REACTION: emesis   Cozaar  [Losartan  Potassium] Hives   Norvasc   [Amlodipine ] Swelling    Lower extremity edema     Current Outpatient Medications on File Prior to Visit  Medication Sig Dispense Refill   atenolol -chlorthalidone  (TENORETIC  50) 50-25 MG tablet Take 1 tablet by mouth daily. 90 tablet 3   atorvastatin  (LIPITOR) 20 MG tablet Take 1 tablet (  20 mg total) by mouth daily. 90 tablet 1   KLOR-CON  M20 20 MEQ tablet TAKE 1 TABLET BY MOUTH EVERY DAY 90 tablet 3   cyclobenzaprine  (FLEXERIL ) 10 MG tablet Take 1 tablet (10 mg total) by mouth 3 (three) times daily as needed for muscle spasms. 30 tablet 0   fluticasone  (FLONASE ) 50 MCG/ACT nasal spray SPRAY 2 SPRAYS INTO EACH NOSTRIL EVERY DAY 48 mL 1   Semaglutide ,0.25 or 0.5MG /DOS, 2 MG/3ML SOPN Inject 0.25 mg into the skin once a week. (Patient not taking: Reported on 11/15/2023) 6 mL 0   vardenafil  (LEVITRA ) 20 MG tablet Take 1 tablet (20 mg total) by mouth daily as needed for erectile dysfunction (take 45 minues prior to sexual activity). 5 tablet 11   No current facility-administered medications on file prior to visit.    BP 126/82   Pulse (!) 49   Temp 98.3 F (36.8 C) (Oral)   Ht 5' 11 (1.803 m)   Wt (!) 319 lb (144.7 kg)   SpO2 97%   BMI 44.49 kg/m       Objective:   Physical Exam Vitals and nursing note reviewed.  Constitutional:      Appearance: Normal appearance.  Cardiovascular:     Rate and Rhythm: Normal rate and regular rhythm.     Pulses: Normal pulses.     Heart sounds: Normal heart sounds.  Pulmonary:     Effort: Pulmonary effort is normal.  Abdominal:     General: Abdomen is flat. Bowel sounds are normal. There is no distension.     Palpations: Abdomen is soft. There is no mass.     Tenderness: There is abdominal tenderness in the right upper quadrant and left upper quadrant. There is no right CVA tenderness or left CVA tenderness. Negative signs include Murphy's sign, Rovsing's sign and McBurney's sign.     Hernia: No hernia is present.  Musculoskeletal:         General: Normal range of motion.  Skin:    General: Skin is warm and dry.     Findings: Rash present.  Neurological:     General: No focal deficit present.     Mental Status: He is alert and oriented to person, place, and time.  Psychiatric:        Mood and Affect: Mood normal.        Behavior: Behavior normal.        Thought Content: Thought content normal.        Judgment: Judgment normal.        Assessment & Plan:  1. Right flank pain (Primary)  - POC Urinalysis Dipstick - Trace leuks and blood  - Will send UA for culture. Cannot r/o kidney stone. Possibly related to constipation as well. Does not appear as gallbladder disease - Consider Renal CT if no UTI on culture  - Culture, Urine; Future  2. Eczema, unspecified type  - triamcinolone  (KENALOG ) 0.025 % cream; Apply 1 Application topically 2 (two) times daily.  Dispense: 30 g; Refill: 0 - Fluocinolone  Acetonide 0.01 % OIL; Apply to skin daily until clear  Dispense: 20 mL; Refill: 0  3. Constipation, unspecified constipation type - Will have hi start Miralax BID until he has a bowel movement   Darleene Shape, NP  Time spent with patient today was 32 minutes which consisted of chart review, discussing right flank pain, constipation and Eczema.  work up, treatment answering questions and documentation.

## 2023-11-15 NOTE — Telephone Encounter (Signed)
  FYI Only or Action Required?: FYI only for provider.  Patient was last seen in primary care on 08/04/2023 by Merna Huxley, NP.  Called Nurse Triage reporting Flank Pain.  Symptoms began several days ago.  Interventions attempted: Rest, hydration, or home remedies.  Symptoms are: unchanged.  Triage Disposition: See Physician Within 24 Hours  Patient/caregiver understands and will follow disposition?: Yes  Copied from CRM #[id number]. Topic: Clinical - Red Word Triage >> Nov 15, 2023 10:12 AM Deaijah H wrote: Red Word that prompted transfer to Nurse Triage: Sharp pain in side for few days. Reason for Disposition  MODERATE pain (e.g., interferes with normal activities or awakens from sleep)  Answer Assessment - Initial Assessment Questions 1. LOCATION: Where does it hurt? (e.g., left, right)     Right sided near hip 2. ONSET: When did the pain start?     Few days 3. SEVERITY: How bad is the pain? (e.g., Scale 1-10; mild, moderate, or severe)     Sharp 8/10 4. PATTERN: Does the pain come and go, or is it constant?      Intermittent 5. CAUSE: What do you think is causing the pain?     Unsure 6. OTHER SYMPTOMS:  Do you have any other symptoms? (e.g., fever, abdomen pain, vomiting, leg weakness, burning with urination, blood in urine)     Bowel movements have been erratic, last bm within 3 days.  Protocols used: Flank Pain-A-AH

## 2023-11-16 ENCOUNTER — Telehealth: Payer: Self-pay | Admitting: *Deleted

## 2023-11-16 NOTE — Telephone Encounter (Signed)
 Pt stated that the pharmacy filled the one that is prescribed for the ear and not for the scalp. Pt stated that the one they gave him only says Fluocinolone  0.01 % OIL and it does not mention the  Acetonide in it.

## 2023-11-16 NOTE — Telephone Encounter (Signed)
 Copied from CRM #[id number]. Topic: Clinical - Prescription Issue >> Nov 16, 2023 10:52 AM Larissa RAMAN wrote: Reason for CRM: Patient states the incorrect prescription was sent to his pharmacy and would like to have a new prescription sent in for Fluocinolone  Acetonide 0.01 % OIL.   [id number] [address], [place] - [address] [place] [address] Phone: [phone] Fax: [phone] Hours: Not open 24 hours

## 2023-11-16 NOTE — Telephone Encounter (Signed)
**Note De-identified  [place] Obfuscation** Please advise 

## 2023-11-17 ENCOUNTER — Other Ambulatory Visit: Payer: Self-pay | Admitting: Adult Health

## 2023-11-17 ENCOUNTER — Ambulatory Visit: Payer: Self-pay | Admitting: Adult Health

## 2023-11-17 DIAGNOSIS — L309 Dermatitis, unspecified: Secondary | ICD-10-CM

## 2023-11-17 LAB — URINE CULTURE
MICRO NUMBER:: 16789142
SPECIMEN QUALITY:: ADEQUATE

## 2023-11-17 MED ORDER — AMOXICILLIN-POT CLAVULANATE 875-125 MG PO TABS: 1.0000 | ORAL_TABLET | Freq: Two times a day (BID) | ORAL | 0 refills | Status: AC

## 2023-11-17 MED ORDER — FLUOCINOLONE ACETONIDE 0.01 % OT OIL: TOPICAL_OIL | OTIC | 0 refills | Status: AC

## 2023-11-17 NOTE — Telephone Encounter (Signed)
 Patient notified of update  and verbalized understanding.

## 2023-11-28 DIAGNOSIS — D23112 Other benign neoplasm of skin of right lower eyelid, including canthus: Secondary | ICD-10-CM | POA: Diagnosis not present

## 2023-12-05 DIAGNOSIS — D23112 Other benign neoplasm of skin of right lower eyelid, including canthus: Secondary | ICD-10-CM | POA: Diagnosis not present

## 2023-12-05 DIAGNOSIS — L218 Other seborrheic dermatitis: Secondary | ICD-10-CM | POA: Diagnosis not present

## 2024-01-18 ENCOUNTER — Other Ambulatory Visit: Payer: Self-pay

## 2024-01-18 DIAGNOSIS — C61 Malignant neoplasm of prostate: Secondary | ICD-10-CM

## 2024-01-23 ENCOUNTER — Other Ambulatory Visit

## 2024-01-23 DIAGNOSIS — C61 Malignant neoplasm of prostate: Secondary | ICD-10-CM

## 2024-01-24 LAB — PSA: Prostate Specific Ag, Serum: <0.1 ng/mL (ref 0.0–4.0)

## 2024-01-25 ENCOUNTER — Ambulatory Visit: Admitting: Urology

## 2024-01-25 ENCOUNTER — Other Ambulatory Visit: Payer: Self-pay

## 2024-01-25 VITALS — BP 150/91 | HR 47 | Ht 71.0 in | Wt 315.0 lb

## 2024-01-25 DIAGNOSIS — C61 Malignant neoplasm of prostate: Secondary | ICD-10-CM | POA: Diagnosis not present

## 2024-01-25 DIAGNOSIS — R399 Unspecified symptoms and signs involving the genitourinary system: Secondary | ICD-10-CM

## 2024-01-25 DIAGNOSIS — N529 Male erectile dysfunction, unspecified: Secondary | ICD-10-CM

## 2024-01-25 NOTE — Progress Notes (Signed)
   01/25/2024 2:47 PM   Jonathan [name] [birth year] [id number]  Reason for visit: Follow up history of prostate cancer, urinary symptoms, hypogonadism, ED  History: Number of comorbidities including morbid obesity with BMI 44 History of favorable intermediate risk prostate cancer treated with radical prostatectomy 2016 at [facility] with negative margins, PSA has been undetectable since that time, he has continued to request every 6 month PSA Hypogonadism with 2 testosterone  levels <200, he is not interested in treatment options Urinary frequency, high fluid intake, defers medication treatments 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, not interested in other treatment  Physical Exam: BP (!) 150/91   Pulse (!) 47   Ht 5' 11 (1.803 m)   Wt (!) 315 lb (142.9 kg)   BMI 43.93 kg/m   Imaging/labs: PSA October 2025 remains undetectable  Today: No changes in his health since our last visit He is resistant to taking any medications at this time which makes any intervention challenging High-volume urinary frequency but consumes high volume of fluids, and morbid obesity likely contributes ED refractory to medications, not interested in other treatment options Not interested in treatment options or medications for hypogonadism, we reviewed lifestyle factors and weight loss Reassurance provided regarding undetectable PSA  Plan:   Prostate cancer: Treated with radical prostatectomy 2016 at [facility], favorable intermediate risk disease, PSA has been undetectable.  He requests ongoing every 6 month PSA monitoring.  Low risk of recurrence now that he is 10 years out with undetectable PSA ED/urinary symptoms/hypogonadism: He is not interested in medication options, we reviewed behavioral strategies, weight loss, avoiding bladder irritants, lifestyle strategies RTC 6 months lab visit PSA, RTC in person 1 year PSA prior, PVR   Jonathan JAYSON Burnet, MD  [facility] [address] [place], [place] [address] [phone]

## 2024-01-26 ENCOUNTER — Ambulatory Visit: Payer: Self-pay | Admitting: Urology

## 2024-02-04 ENCOUNTER — Other Ambulatory Visit: Payer: Self-pay | Admitting: Adult Health

## 2024-02-04 DIAGNOSIS — I10 Essential (primary) hypertension: Secondary | ICD-10-CM

## 2024-03-29 ENCOUNTER — Other Ambulatory Visit: Payer: Self-pay | Admitting: Adult Health

## 2024-03-29 DIAGNOSIS — I10 Essential (primary) hypertension: Secondary | ICD-10-CM

## 2024-05-01 ENCOUNTER — Telehealth: Payer: Self-pay | Admitting: *Deleted

## 2024-05-01 NOTE — Telephone Encounter (Signed)
 Copied from CRM #[id number]. Topic: Clinical - Request for Lab/Test Order >> May 01, 2024  1:46 PM Mesmerise C wrote: Reason for CRM: Patient would like labs to be done with physical on 2/24 can be called back to scheduled once order is placed

## 2024-05-01 NOTE — Telephone Encounter (Signed)
 Patient notified that insurance may not pay for labs ahead of time. I advised pt to call insurance to see if he is able to have them done. Pt stated that he can just wait til the day of appt. No further action needed.

## 2024-05-10 ENCOUNTER — Other Ambulatory Visit: Payer: Self-pay | Admitting: Adult Health

## 2024-05-10 DIAGNOSIS — E876 Hypokalemia: Secondary | ICD-10-CM

## 2024-05-15 ENCOUNTER — Telehealth: Payer: Self-pay

## 2024-05-15 MED ORDER — ATORVASTATIN CALCIUM 20 MG PO TABS: 20.0000 mg | ORAL_TABLET | Freq: Every day | ORAL | 1 refills | Status: AC

## 2024-05-15 NOTE — Telephone Encounter (Signed)
-----   Message from Jon VEAR Lindau sent at 05/14/2024  9:27 AM EST ----- Hello,  Pharmacy requesting a new refill for the following prescription:  Atorvastatin  20mg  1 tablet by mouth daily  Pharmacy Info: [id number] - [place], [place] - [address] P: [phone] F: [phone]   Thank you, Jon VEAR Lindau, PharmD Clinical Pharmacist [phone]

## 2024-05-15 NOTE — Telephone Encounter (Signed)
 Medication has been sent in

## 2024-06-05 ENCOUNTER — Encounter: Admitting: Adult Health

## 2024-07-25 ENCOUNTER — Other Ambulatory Visit

## 2024-08-10 ENCOUNTER — Ambulatory Visit: Admitting: Pulmonary Disease

## 2024-10-24 ENCOUNTER — Ambulatory Visit

## 2025-01-30 ENCOUNTER — Ambulatory Visit: Admitting: Physician Assistant
